# Patient Record
Sex: Male | Born: 1995 | Race: White | Hispanic: No | Marital: Single | Smoking: Former smoker
Health system: Southern US, Community
[De-identification: ages and names within clinical notes are randomized; demographics above are authoritative.]

## PROBLEM LIST (undated history)

## (undated) DIAGNOSIS — F419 Anxiety disorder, unspecified: Secondary | ICD-10-CM

## (undated) DIAGNOSIS — Z808 Family history of malignant neoplasm of other organs or systems: Secondary | ICD-10-CM

## (undated) DIAGNOSIS — R55 Syncope and collapse: Secondary | ICD-10-CM

## (undated) DIAGNOSIS — F329 Major depressive disorder, single episode, unspecified: Secondary | ICD-10-CM

## (undated) DIAGNOSIS — F32A Depression, unspecified: Secondary | ICD-10-CM

## (undated) DIAGNOSIS — Z803 Family history of malignant neoplasm of breast: Secondary | ICD-10-CM

## (undated) DIAGNOSIS — E785 Hyperlipidemia, unspecified: Secondary | ICD-10-CM

## (undated) DIAGNOSIS — R0600 Dyspnea, unspecified: Secondary | ICD-10-CM

## (undated) DIAGNOSIS — T7840XA Allergy, unspecified, initial encounter: Secondary | ICD-10-CM

## (undated) DIAGNOSIS — K5792 Diverticulitis of intestine, part unspecified, without perforation or abscess without bleeding: Secondary | ICD-10-CM

## (undated) HISTORY — DX: Family history of malignant neoplasm of breast: Z80.3

## (undated) HISTORY — DX: Anxiety disorder, unspecified: F41.9

## (undated) HISTORY — DX: Allergy, unspecified, initial encounter: T78.40XA

## (undated) HISTORY — DX: Hyperlipidemia, unspecified: E78.5

## (undated) HISTORY — DX: Family history of malignant neoplasm of other organs or systems: Z80.8

## (undated) HISTORY — DX: Syncope and collapse: R55

---
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
## 2014-07-27 ENCOUNTER — Emergency Department: Payer: Self-pay | Admitting: Emergency Medicine

## 2014-08-10 ENCOUNTER — Emergency Department: Payer: Self-pay | Admitting: Emergency Medicine

## 2014-08-12 ENCOUNTER — Ambulatory Visit: Payer: Self-pay | Admitting: Physician Assistant

## 2014-12-16 ENCOUNTER — Emergency Department: Admit: 2014-12-16 | Discharge status: Against Medical Advice | Payer: Self-pay | Admitting: Emergency Medicine

## 2014-12-16 LAB — COMPREHENSIVE METABOLIC PANEL
ALBUMIN: 4.1 g/dL
ALK PHOS: 85 U/L
AST: 24 U/L
Anion Gap: 6 — ABNORMAL LOW (ref 7–16)
BUN: 11 mg/dL
Bilirubin,Total: 0.2 mg/dL — ABNORMAL LOW
CALCIUM: 8.8 mg/dL — AB
Chloride: 106 mmol/L
Co2: 26 mmol/L
Creatinine: 0.81 mg/dL
EGFR (Non-African Amer.): >60
Glucose: 110 mg/dL — ABNORMAL HIGH
Potassium: 3.9 mmol/L
SGPT (ALT): 33 U/L
SODIUM: 138 mmol/L
Total Protein: 7.1 g/dL

## 2014-12-16 LAB — CBC WITH DIFFERENTIAL/PLATELET
BASOS PCT: 0.9 %
Basophil #: 0.1 10*3/uL (ref 0.0–0.1)
Eosinophil #: 0.3 10*3/uL (ref 0.0–0.7)
Eosinophil %: 2.9 %
HCT: 44 % (ref 40.0–52.0)
HGB: 14.6 g/dL (ref 13.0–18.0)
LYMPHS ABS: 2.9 10*3/uL (ref 1.0–3.6)
Lymphocyte %: 30 %
MCH: 29.1 pg (ref 26.0–34.0)
MCHC: 33.2 g/dL (ref 32.0–36.0)
MCV: 88 fL (ref 80–100)
MONO ABS: 1.2 x10 3/mm — AB (ref 0.2–1.0)
Monocyte %: 12.7 %
Neutrophil #: 5.1 10*3/uL (ref 1.4–6.5)
Neutrophil %: 53.5 %
Platelet: 233 10*3/uL (ref 150–440)
RBC: 5.01 10*6/uL (ref 4.40–5.90)
RDW: 13.8 % (ref 11.5–14.5)
WBC: 9.5 10*3/uL (ref 3.8–10.6)

## 2014-12-16 LAB — URINALYSIS, COMPLETE
Bacteria: NONE SEEN
Bilirubin,UR: NEGATIVE
Glucose,UR: NEGATIVE mg/dL (ref 0–75)
KETONE: NEGATIVE
Leukocyte Esterase: NEGATIVE
Nitrite: NEGATIVE
PH: 5 (ref 4.5–8.0)
SPECIFIC GRAVITY: 1.024 (ref 1.003–1.030)

## 2015-04-19 ENCOUNTER — Ambulatory Visit
Admission: EM | Admit: 2015-04-19 | Discharge: 2015-04-19 | Disposition: A | Discharge status: Home / Self Care | Payer: Medicaid Other | Attending: Family Medicine | Admitting: Family Medicine

## 2015-04-19 ENCOUNTER — Encounter: Payer: Self-pay | Admitting: Gynecology

## 2015-04-19 DIAGNOSIS — J40 Bronchitis, not specified as acute or chronic: Secondary | ICD-10-CM

## 2015-04-19 MED ORDER — HYDROCOD POLST-CPM POLST ER 10-8 MG/5ML PO SUER: 5.0000 mL | Freq: Every evening | ORAL | Status: DC | PRN

## 2015-04-19 MED ORDER — ALBUTEROL SULFATE HFA 108 (90 BASE) MCG/ACT IN AERS: 1.0000 | INHALATION_SPRAY | Freq: Four times a day (QID) | RESPIRATORY_TRACT | Status: DC | PRN

## 2015-04-19 MED ORDER — AZITHROMYCIN 200 MG/5ML PO SUSR: 500.0000 mg | Freq: Every day | ORAL | Status: AC

## 2015-04-19 NOTE — ED Notes (Addendum)
Patient c/o x 3 days with coughing / sinus drainage and greenish mucous.

## 2015-04-19 NOTE — ED Provider Notes (Signed)
SUBJECTIVE:  Timothy [name] is a 19 y.o. male who complains of sore throat, nasal congestion, productive cough for the last few days. Denies CP, SOB, N/V/D, abdominal pain, severe headache. Has tried OTC Medication without much relief. Denies asthma or smoking hx.   ROS: Negative except mentioned above.    OBJECTIVE: Vital signs are as noted in Epic   General: NAD HEENT: mild pharyngeal erythema, no exudate, no erythema of TMs, no cervical LAD Respiratory: CTA B, no accessory muscle use noted  Cardiology: RRR Neurological: CN II-XII grossly intact   ASSESSMENT:  Bronchitis  PLAN: Patient states that he cannot take pills. Patient given prescription for liquid Zithromax, Tussionex for bedtime use, albuterol when necessary, rest, hydration, seek medical attention if symptoms persist or worsen.        Paulina Fusi, MD 04/19/15 1128

## 2015-05-30 ENCOUNTER — Ambulatory Visit
Admission: EM | Admit: 2015-05-30 | Discharge: 2015-05-30 | Disposition: A | Discharge status: Home / Self Care | Payer: Medicaid Other | Attending: Internal Medicine | Admitting: Internal Medicine

## 2015-05-30 DIAGNOSIS — F17200 Nicotine dependence, unspecified, uncomplicated: Secondary | ICD-10-CM | POA: Diagnosis not present

## 2015-05-30 DIAGNOSIS — K2970 Gastritis, unspecified, without bleeding: Secondary | ICD-10-CM | POA: Diagnosis not present

## 2015-05-30 DIAGNOSIS — R197 Diarrhea, unspecified: Secondary | ICD-10-CM | POA: Diagnosis present

## 2015-05-30 DIAGNOSIS — R42 Dizziness and giddiness: Secondary | ICD-10-CM | POA: Diagnosis present

## 2015-05-30 LAB — URINALYSIS COMPLETE WITH MICROSCOPIC (ARMC ONLY)
BILIRUBIN URINE: NEGATIVE
Bacteria, UA: NONE SEEN — AB
Glucose, UA: NEGATIVE mg/dL
HGB URINE DIPSTICK: NEGATIVE
KETONES UR: NEGATIVE mg/dL
LEUKOCYTES UA: NEGATIVE
NITRITE: NEGATIVE
PH: 5.5 (ref 5.0–8.0)
Protein, ur: NEGATIVE mg/dL
RBC / HPF: NONE SEEN RBC/hpf (ref <3)
SPECIFIC GRAVITY, URINE: 1.01 (ref 1.005–1.030)

## 2015-05-30 MED ORDER — GI COCKTAIL ~~LOC~~: 30.0000 mL | Freq: Once | ORAL | Status: DC

## 2015-05-30 NOTE — Discharge Instructions (Signed)
° ° °  Zantac 75 mg Take 2 now, take 2 twice a day for 3 days then cut back to one a day Return to [facility] or the ER With dizziness or chest pain should they redevelop  Diarrhea and indigestion - mild foods, no high spice, no alcohol, Avoid sodas and acid foods- Drink water !   Gastritis, Adult Gastritis is soreness and puffiness (inflammation) of the lining of the stomach. If you do not get help, gastritis can cause bleeding and sores (ulcers) in the stomach. HOME CARE   Only take medicine as told by your doctor.  If you were given antibiotic medicines, take them as told. Finish the medicines even if you start to feel better.  Drink enough fluids to keep your pee (urine) clear or pale yellow.  Avoid foods and drinks that make your problems worse. Foods you may want to avoid include:  Caffeine or alcohol.  Chocolate.  Mint.  Garlic and onions.  Spicy foods.  Citrus fruits, including oranges, lemons, or limes.  Food containing tomatoes, including sauce, chili, salsa, and pizza.  Fried and fatty foods.  Eat small meals throughout the day instead of large meals. GET HELP RIGHT AWAY IF:   You have black or dark red poop (stools).  You throw up (vomit) blood. It may look like coffee grounds.  You cannot keep fluids down.  Your belly (abdominal) pain gets worse.  You have a fever.  You do not feel better after 1 week.  You have any other questions or concerns. MAKE SURE YOU:   Understand these instructions.  Will watch your condition.  Will get help right away if you are not doing well or get worse. Document Released: 02/01/2008 Document Revised: 11/07/2011 Document Reviewed: 09/28/2011 [facility] Patient Information 2015 [place], [place]. This information is not intended to replace advice given to you by your health care provider. Make sure you discuss any questions you have with your health care provider.

## 2015-05-30 NOTE — ED Notes (Signed)
Patient with diarrhea the past 24 hours. Started feeling dizzy and dehydrated this morning.

## 2015-05-30 NOTE — ED Provider Notes (Signed)
CSN: [id number]     Arrival date & time 05/30/15  1212 History   First MD Initiated Contact with Patient 05/30/15 1315     Chief Complaint  Patient presents with  . Diarrhea  . Dizziness   (Consider location/radiation/quality/duration/timing/severity/associated sxs/prior Treatment) HPI 19 yo M left work last night because of one episode of diarrhea reported. Needs to be seen to return to work. Had a Chicken biscuit and Dr. Malachi Bonds for breakfast and Cristy Friedlander for dinner yesterday. Blames the lasagna. Has not had fever, malaise or other symptoms Denies additional episodes in night or this morning. Has not eaten anything today. Thinks he feels a little dizzy but  Blames on no food. Can't describe the feeling/symptoms. Reports he had soft large formed stool this morning. Reports mid-epigastric discomfort at various times in the past. Tells disjointed story . Denies recent  drugs or alcohol  but references multiple past experiences of increased ingestion-reportedly stopping some months ago when Mid-epigastric discomfort took him to the ER. Was discharged without overnight stay.  Showing me that his sternum was responsible for the discomfort. Smokes a pack a week reported.  He is currently drinking Powerade and appears to be tolerating. He is offered crackers and peanut butter but defers Walking around the room and has been to bathroom and back without assistance-void. Denies any additional diarrhea Repeatedly returns to playing games and email on his cell phone despite visit  History reviewed. No pertinent past medical history. History reviewed. No pertinent past surgical history. History reviewed. No pertinent family history. Social History  Substance Use Topics  . Smoking status: Current Some Day Smoker  . Smokeless tobacco: None  . Alcohol Use: No    Review of Systems Review of 10 systems negative for acute change except as referenced in HPI  Allergies  Review of patient's allergies  indicates no known allergies.  Home Medications   Prior to Admission medications   Medication Sig Start Date End Date Taking? Authorizing Provider  albuterol (PROVENTIL HFA;VENTOLIN HFA) 108 (90 BASE) MCG/ACT inhaler Inhale 1-2 puffs into the lungs every 6 (six) hours as needed for wheezing or shortness of breath. 04/19/15   Paulina Fusi, MD  chlorpheniramine-HYDROcodone (TUSSIONEX PENNKINETIC ER) 10-8 MG/5ML SUER Take 5 mLs by mouth at bedtime as needed for cough. 04/19/15   Paulina Fusi, MD   Meds Ordered and Administered this Visit  Medications - No data to display  BP 116/68 mmHg  Pulse 78  Temp(Src) 98.1 F (36.7 C) (Oral)  Resp 20  Ht 6' (1.829 m)  Wt 270 lb (122.471 kg)  BMI 36.61 kg/m2  SpO2 98% No data found.  Results for orders placed or performed during the hospital encounter of 05/30/15  Urinalysis complete, with microscopic  Result Value Ref Range   Color, Urine YELLOW YELLOW   APPearance CLEAR CLEAR   Glucose, UA NEGATIVE NEGATIVE mg/dL   Bilirubin Urine NEGATIVE NEGATIVE   Ketones, ur NEGATIVE NEGATIVE mg/dL   Specific Gravity, Urine 1.010 1.005 - 1.030   Hgb urine dipstick NEGATIVE NEGATIVE   pH 5.5 5.0 - 8.0   Protein, ur NEGATIVE NEGATIVE mg/dL   Nitrite NEGATIVE NEGATIVE   Leukocytes, UA NEGATIVE NEGATIVE   RBC / HPF NONE SEEN <3 RBC/hpf   WBC, UA 0-5 <3 WBC/hpf   Bacteria, UA NONE SEEN (A) RARE   Squamous Epithelial / LPF 0-5 (A) RARE    Physical Exam  Constitutional: Alert and oriented, not ill appearing, VS are noted,  270 pounds General :  No acute distress; multiple somatic mild complaints Head:normocephalic, atraumatic,  Eyes: conjugate gaze,negative conjunctiva,  Ears:Grossly normal hearing, Bilateral canals and tympanic membranes WNL Nose:normal Mouth/throat :Mucous membranes moist, No pharyngeal erythema,no exudate,  Neck :  supple  Heart: Normal rate, regular rhythm Lung:    Normal respiratory effort and rate , no distress Back:    No  CVAT, no spinal tenderness noted Abd :    Obese ,soft,  Mid epigastric tenderness mild, bowel sounds present, no guarding,rebound or organomegaly appreciated MSK:   nontender, normal ROM all extremities; ambulatory in unit, on and off table without assistance Neuro:Face symmetric, EOMI, PERRLA,tongue midline. Grossly intact,normal gait, normal speech and language Skin:  Warm,dry,intact Psych: mood and affect WNL  ED Course  Procedures (including critical care time)  Labs Review Labs Reviewed  URINALYSIS COMPLETEWITH MICROSCOPIC ([facility] ONLY) - Abnormal; Notable for the following:    Bacteria, UA NONE SEEN (*)    Squamous Epithelial / LPF 0-5 (*)    All other components within normal limits  above  Imaging Review No results found.  U/A Ketones negative   ED ECG REPORT   Date10/08/2014 EKG Time:   Rate:60  Rhythm: Normal sinus rhythm with sinus arrythmia,   Axis:33 41 34  PRT  Intervals: PR 130  ST&T Change:none  Narrative Interpretation: Normal EKG Reviewed with Dr Valere Dross and considered normal    GI cocktail ordered for patient - discussed helpful in evaluation GI irritation.  He accepted with me and then refused when nurse brought to bedside. Impression Gastritis    MDM   1. Gastritis   Diagnosis and treatment discussed. Tried to encourage GI cocktail before discharge and he again refused. Encouraged him to try ZANTAC OTC 75 mg take 2, try for 2 weeks, no alcohol.  Questions fielded, expectations and recommendations reviewed.  Patient expresses understanding. Will return to [facility] with questions, concern or exacerbation.  Encourage PCP visit and GI intervention  Discouraged alcohol- encourage balanced diet- young/overweight  Work note given that he was seen and examined and released back to work for tomorrow. No Restrictions. Shift already underway today. He was disappointed and wanted a few days off- deferred.      Jan Fireman, PA-C 06/01/15 [phone]

## 2015-08-04 ENCOUNTER — Ambulatory Visit (INDEPENDENT_AMBULATORY_CARE_PROVIDER_SITE_OTHER): Payer: Medicaid Other | Admitting: Family Medicine

## 2015-08-04 ENCOUNTER — Encounter: Payer: Self-pay | Admitting: Family Medicine

## 2015-08-04 VITALS — BP 116/79 | HR 76 | Temp 97.9°F | Resp 16 | Ht 72.0 in | Wt 255.6 lb

## 2015-08-04 DIAGNOSIS — F17200 Nicotine dependence, unspecified, uncomplicated: Secondary | ICD-10-CM | POA: Insufficient documentation

## 2015-08-04 DIAGNOSIS — J301 Allergic rhinitis due to pollen: Secondary | ICD-10-CM

## 2015-08-04 DIAGNOSIS — Z720 Tobacco use: Secondary | ICD-10-CM | POA: Diagnosis not present

## 2015-08-04 DIAGNOSIS — E669 Obesity, unspecified: Secondary | ICD-10-CM | POA: Diagnosis not present

## 2015-08-04 DIAGNOSIS — Z7689 Persons encountering health services in other specified circumstances: Secondary | ICD-10-CM

## 2015-08-04 DIAGNOSIS — Z8249 Family history of ischemic heart disease and other diseases of the circulatory system: Secondary | ICD-10-CM | POA: Diagnosis not present

## 2015-08-04 DIAGNOSIS — Z7189 Other specified counseling: Secondary | ICD-10-CM

## 2015-08-04 MED ORDER — FLUTICASONE PROPIONATE 50 MCG/ACT NA SUSP: 2.0000 | Freq: Every day | NASAL | Status: DC

## 2015-08-04 NOTE — Assessment & Plan Note (Signed)
Flonase given to prevent allergy symptoms.

## 2015-08-04 NOTE — Progress Notes (Addendum)
Subjective:    Patient ID: Timothy [name], male    DOB: [birth year], 19 y.o.   MRN: [id number]  HPI: Timothy [name] is a 19 y.o. male presenting on 08/04/2015 for Establish Care   HPI  Pt presents to establish care today. Previous care provider was [facility].  It has been several years since his last PCP visit. Records from previous provider will be requested and reviewed. Current medical problems include:  Seasonal allergies: Takes PRN zyrtec. Pt has sinus drainage occasionally.  ADHD- on medications as a child. Feels as though he can focus easily as an adult.    Health maintenance:  Last tetanus- 2014  Current smoker- 1 pack per week. Trying to quit.  Works at a [employer]- Exposed to some chemicals at work No ETOH. Used to drink alcohol stopped 1.5 years ago after a fall when he was drunk.  Stays active with work. Recently lost 20lbs.     Past Medical History  Diagnosis Date  . Fainting spell     in middle school  . Allergy    Social History   Social History  . Marital Status: Single    Spouse Name: N/A  . Number of Children: N/A  . Years of Education: N/A   Occupational History  . Not on file.   Social History Main Topics  . Smoking status: Current Some Day Smoker -- 0.25 packs/day    Types: Cigarettes  . Smokeless tobacco: Not on file     Comment: pt smoke pack a week  . Alcohol Use: No  . Drug Use: No  . Sexual Activity: Not on file   Other Topics Concern  . Not on file   Social History Narrative   Family History  Problem Relation Age of Onset  . Heart disease Maternal Grandmother   . Diabetes Maternal Grandmother   . Cancer Maternal Grandmother     breast cancer  . BRCA 1/2 Maternal Grandmother   . Cancer Paternal Grandfather   . Healthy Sister   . BRCA 1/2 Maternal Aunt   . Depression Mother    No current outpatient prescriptions on file prior to visit.   No current facility-administered medications on file prior to  visit.    Review of Systems  Constitutional: Negative for fever and chills.  HENT: Negative.   Respiratory: Negative for chest tightness, shortness of breath and wheezing.   Cardiovascular: Negative for chest pain, palpitations and leg swelling.  Gastrointestinal: Negative for nausea, vomiting and abdominal pain.  Endocrine: Negative.   Genitourinary: Negative for dysuria, urgency, discharge, penile pain and testicular pain.  Musculoskeletal: Negative for back pain, joint swelling and arthralgias.  Skin: Negative.   Neurological: Negative for dizziness, weakness, numbness and headaches.  Psychiatric/Behavioral: Negative for sleep disturbance and dysphoric mood.   Per HPI unless specifically indicated above     Objective:    BP 116/79 mmHg  Pulse 76  Temp(Src) 97.9 F (36.6 C) (Oral)  Resp 16  Ht 6' (1.829 m)  Wt 255 lb 9.6 oz (115.939 kg)  BMI 34.66 kg/m2  Wt Readings from Last 3 Encounters:  01/26/16 249 lb (112.946 kg)  09/02/15 254 lb (115.214 kg)  08/04/15 255 lb 9.6 oz (115.939 kg)    Physical Exam  Constitutional: He is oriented to person, place, and time. He appears well-developed and well-nourished. No distress.  HENT:  Head: Normocephalic and atraumatic.  Neck: Neck supple. No thyromegaly present.  Cardiovascular: Normal rate, regular rhythm and  normal heart sounds.  Exam reveals no gallop and no friction rub.   No murmur heard. Pulmonary/Chest: Effort normal and breath sounds normal. He has no wheezes.  Abdominal: Soft. Bowel sounds are normal. He exhibits no distension. There is no tenderness. There is no rebound.  Musculoskeletal: Normal range of motion. He exhibits no edema or tenderness.  Neurological: He is alert and oriented to person, place, and time. He has normal reflexes.  Skin: Skin is warm and dry. No rash noted. No erythema.  Psychiatric: He has a normal mood and affect. His behavior is normal. Thought content normal.   Results for orders placed  or performed during the hospital encounter of 05/30/15  Urinalysis complete, with microscopic  Result Value Ref Range   Color, Urine YELLOW YELLOW   APPearance CLEAR CLEAR   Glucose, UA NEGATIVE NEGATIVE mg/dL   Bilirubin Urine NEGATIVE NEGATIVE   Ketones, ur NEGATIVE NEGATIVE mg/dL   Specific Gravity, Urine 1.010 1.005 - 1.030   Hgb urine dipstick NEGATIVE NEGATIVE   pH 5.5 5.0 - 8.0   Protein, ur NEGATIVE NEGATIVE mg/dL   Nitrite NEGATIVE NEGATIVE   Leukocytes, UA NEGATIVE NEGATIVE   RBC / HPF NONE SEEN <3 RBC/hpf   WBC, UA 0-5 <3 WBC/hpf   Bacteria, UA NONE SEEN (A) RARE   Squamous Epithelial / LPF 0-5 (A) RARE      Assessment & Plan:   Problem List Items Addressed This Visit      Respiratory   Allergic rhinitis due to pollen    Flonase given to prevent allergy symptoms.         Other   Smoker    Encouraged smoking cessation. Referred to [place] [facility]. 5 minutes of smoking cessation counseling completed.       Relevant Orders   Comprehensive Metabolic Panel (CMET)   Obesity    Encouraged healthy eating habits. Pt has lost 20lbs with increased exercise- commended efforts thus far.  Reviewed plate method of healthy eating. Encouraged 150 minutes moderate activity per week.       Relevant Orders   Lipid Profile    Other Visit Diagnoses    Encounter to establish care    -  Primary    Family history of heart disease        Check lipid panel to stratify risk factors.     Relevant Orders    Lipid Profile    Comprehensive Metabolic Panel (CMET)       Meds ordered this encounter  Medications  . DISCONTD: fluticasone (FLONASE) 50 MCG/ACT nasal spray    Sig: Place 2 sprays into both nostrils daily.    Dispense:  16 g    Refill:  11    Order Specific Question:  Supervising Provider    Answer:  Arlis Porta [phone]      Follow up plan: Return in about 1 year (around 08/03/2016), or if symptoms worsen or fail to improve.

## 2015-08-04 NOTE — Patient Instructions (Signed)

## 2015-08-04 NOTE — Assessment & Plan Note (Signed)
Encouraged smoking cessation. Referred to [place] [facility]. 5 minutes of smoking cessation counseling completed.

## 2015-08-04 NOTE — Assessment & Plan Note (Signed)
Encouraged healthy eating habits. Pt has lost 20lbs with increased exercise- commended efforts thus far.  Reviewed plate method of healthy eating. Encouraged 150 minutes moderate activity per week.

## 2015-09-02 ENCOUNTER — Ambulatory Visit (INDEPENDENT_AMBULATORY_CARE_PROVIDER_SITE_OTHER): Payer: Medicaid Other | Admitting: Family Medicine

## 2015-09-02 VITALS — BP 111/77 | HR 89 | Temp 98.1°F | Resp 16 | Ht 72.0 in | Wt 254.0 lb

## 2015-09-02 DIAGNOSIS — A084 Viral intestinal infection, unspecified: Secondary | ICD-10-CM | POA: Diagnosis not present

## 2015-09-02 MED ORDER — ONDANSETRON HCL 4 MG PO TABS: 4.0000 mg | ORAL_TABLET | Freq: Three times a day (TID) | ORAL | Status: DC | PRN

## 2015-09-02 NOTE — Patient Instructions (Signed)
I think you have a stomach virus.  We will treat your nausea today. You can take zofran every 8 hours as needed for nausea. This medication can make you sleepy.  We will treat your symptoms with supportive care: Ensure you are drinking plenty of fluids- I recommend diluting gatorade 2oz to 6 oz water or drinking chicken broth for electroyltes and to boost your blood sugar. Start with bland meals when you feel up to it. Drinking is the most important- you do not need to eat if you don't feel well. Try peptobismal to help with your symptoms.  Alarm signs: If you noticed blood in your diarrhea, diarrhea continues for > 1.5-2 weeks without improving, shortness of breath, chest pain, or any concerning symptoms, please seek medical attention.

## 2015-09-02 NOTE — Progress Notes (Signed)
Subjective:    Patient ID: [place] Tyler Iwata, male    DOB: Sep 20, 1995, 20 y.o.   MRN: [id number]  HPI: Martin[place] Tyler Storts is a 20 y.o. male presenting on 09/02/2015 for Emesis   HPI  Pt presents for nausea, vomiting, and diarrhea x 3 days. Symptoms began on New Years Day with vomiting. Diarrhea began later that day. 6-7 episodes of diarrhea total. 3 stools per day. Emesis 4-5 times per day.  Mostly emesis today. No blood in vomit.  No blood in stool. Stool is watery. Able to drink. Still voiding at least 3 times per day. Unable to keep food down.     Past Medical History  Diagnosis Date  . Fainting spell     in middle school  . Allergy     Current Outpatient Prescriptions on File Prior to Visit  Medication Sig  . albuterol (PROVENTIL HFA;VENTOLIN HFA) 108 (90 BASE) MCG/ACT inhaler Inhale 1-2 puffs into the lungs every 6 (six) hours as needed for wheezing or shortness of breath.  . fluticasone (FLONASE) 50 MCG/ACT nasal spray Place 2 sprays into both nostrils daily.   No current facility-administered medications on file prior to visit.    Review of Systems  Constitutional: Negative for fever and chills.  HENT: Negative.   Eyes: Negative.   Respiratory: Negative for chest tightness, shortness of breath and wheezing.   Cardiovascular: Negative for chest pain, palpitations and leg swelling.  Gastrointestinal: Positive for nausea, vomiting, abdominal pain and diarrhea. Negative for blood in stool.  Endocrine: Negative.   Genitourinary: Negative for urgency, decreased urine volume and difficulty urinating.  Musculoskeletal: Negative for neck pain and neck stiffness.  Skin: Negative for rash.  Neurological: Negative for dizziness, syncope and headaches.  Psychiatric/Behavioral: Negative.    Per HPI unless specifically indicated above     Objective:    BP 111/77 mmHg  Pulse 89  Temp(Src) 98.1 F (36.7 C) (Oral)  Resp 16  Ht 6' (1.829 m)  Wt 254 lb (115.214 kg)  BMI 34.44  kg/m2  Wt Readings from Last 3 Encounters:  09/02/15 254 lb (115.214 kg)  08/04/15 255 lb 9.6 oz (115.939 kg)  05/30/15 270 lb (122.471 kg)    Physical Exam  Constitutional: He is oriented to person, place, and time. He appears well-developed and well-nourished. No distress.  HENT:  Head: Normocephalic and atraumatic.  Mouth/Throat: Mucous membranes are normal.  Neck: Normal range of motion. Neck supple.  Pulmonary/Chest: Effort normal and breath sounds normal. No respiratory distress. He has no wheezes. He has no rales. He exhibits no tenderness.  Abdominal: Soft. He exhibits no distension, no abdominal bruit, no ascites and no pulsatile midline mass. Bowel sounds are increased. There is no hepatosplenomegaly. There is tenderness in the right lower quadrant and left lower quadrant. There is no rigidity, no rebound, no guarding, no tenderness at McBurney's point and negative Murphy's sign.  Musculoskeletal: Normal range of motion. He exhibits no edema or tenderness.  Lymphadenopathy:    He has no cervical adenopathy.  Neurological: He is alert and oriented to person, place, and time.  Skin: Skin is warm and dry. No rash noted. He is not diaphoretic. No erythema.   Results for orders placed or performed during the hospital encounter of 05/30/15  Urinalysis complete, with microscopic  Result Value Ref Range   Color, Urine YELLOW YELLOW   APPearance CLEAR CLEAR   Glucose, UA NEGATIVE NEGATIVE mg/dL   Bilirubin Urine NEGATIVE NEGATIVE   Ketones, ur  NEGATIVE NEGATIVE mg/dL   Specific Gravity, Urine 1.010 1.005 - 1.030   Hgb urine dipstick NEGATIVE NEGATIVE   pH 5.5 5.0 - 8.0   Protein, ur NEGATIVE NEGATIVE mg/dL   Nitrite NEGATIVE NEGATIVE   Leukocytes, UA NEGATIVE NEGATIVE   RBC / HPF NONE SEEN <3 RBC/hpf   WBC, UA 0-5 <3 WBC/hpf   Bacteria, UA NONE SEEN (A) RARE   Squamous Epithelial / LPF 0-5 (A) RARE      Assessment & Plan:   Problem List Items Addressed This Visit    None      Visit Diagnoses    Viral gastroenteritis    -  Primary    No s/s of dehydration, stable today. Supportive care at home. S/s of dehydration reviewed. Alarm symptoms reviewed. Zofran for nausea.     Relevant Medications    ondansetron (ZOFRAN) 4 MG tablet       Meds ordered this encounter  Medications  . ondansetron (ZOFRAN) 4 MG tablet    Sig: Take 1 tablet (4 mg total) by mouth every 8 (eight) hours as needed for nausea or vomiting.    Dispense:  20 tablet    Refill:  0    Order Specific Question:  Supervising Provider    Answer:  Arlis Porta [phone]      Follow up plan: Return if symptoms worsen or fail to improve.

## 2016-01-26 ENCOUNTER — Ambulatory Visit (INDEPENDENT_AMBULATORY_CARE_PROVIDER_SITE_OTHER): Payer: Medicaid Other | Admitting: Family Medicine

## 2016-01-26 ENCOUNTER — Encounter: Payer: Self-pay | Admitting: Family Medicine

## 2016-01-26 VITALS — BP 124/62 | HR 80 | Temp 98.0°F | Resp 16 | Ht 72.0 in | Wt 249.0 lb

## 2016-01-26 DIAGNOSIS — F17200 Nicotine dependence, unspecified, uncomplicated: Secondary | ICD-10-CM

## 2016-01-26 DIAGNOSIS — R7309 Other abnormal glucose: Secondary | ICD-10-CM | POA: Diagnosis not present

## 2016-01-26 DIAGNOSIS — Z6833 Body mass index (BMI) 33.0-33.9, adult: Secondary | ICD-10-CM

## 2016-01-26 DIAGNOSIS — Z0000 Encounter for general adult medical examination without abnormal findings: Secondary | ICD-10-CM | POA: Diagnosis not present

## 2016-01-26 DIAGNOSIS — Z720 Tobacco use: Secondary | ICD-10-CM

## 2016-01-26 NOTE — Assessment & Plan Note (Signed)
Commended patient on current weight loss. Discussed ideal body weight and strategies for weight loss. Recommend eliminating soda from diet. Healthy options for fast food. Increase exercise.

## 2016-01-26 NOTE — Assessment & Plan Note (Signed)
Encouraged patient to quit smoking. Reviewed strategies to quit smoking. Pt would like to quit without medication aid.

## 2016-01-26 NOTE — Progress Notes (Signed)
Subjective:    Patient ID: Timothy [name], male    DOB: [birth year], 20 y.o.   MRN: [id number]  HPI: Timothy [name] is a 20 y.o. male presenting on 01/26/2016 for Annual Exam   HPI  Pt presents for annual physical. No health concerns. Works at [employer]. Mild exercise- stays active in his job. 5lb weight loss since December.  Was previously 280lbs. Typical diet- only eats twice per day-Eats mainly fast food- chic-fil-a, cook-out, and Wednesday- chicken sandwichs. Smoker- 1 pack per week. Is trying to quit.  Alcohol- drinks 2-3 drinks per week.    Past Medical History  Diagnosis Date  . Fainting spell     in middle school  . Allergy    Social History   Social History  . Marital Status: Single    Spouse Name: N/A  . Number of Children: N/A  . Years of Education: N/A   Occupational History  . Not on file.   Social History Main Topics  . Smoking status: Current Some Day Smoker -- 0.25 packs/day    Types: Cigarettes  . Smokeless tobacco: Not on file     Comment: pt smoke pack a week  . Alcohol Use: No  . Drug Use: No  . Sexual Activity: Not on file   Other Topics Concern  . Not on file   Social History Narrative   Family History  Problem Relation Age of Onset  . Heart disease Maternal Grandmother   . Diabetes Maternal Grandmother   . Cancer Maternal Grandmother     breast cancer  . BRCA 1/2 Maternal Grandmother   . Cancer Paternal Grandfather   . Healthy Sister   . BRCA 1/2 Maternal Aunt   . Depression Mother    No current outpatient prescriptions on file prior to visit.   No current facility-administered medications on file prior to visit.    Review of Systems  Constitutional: Negative for fever and chills.  HENT: Negative.   Respiratory: Negative for chest tightness, shortness of breath and wheezing.   Cardiovascular: Negative for chest pain, palpitations and leg swelling.  Gastrointestinal: Negative for nausea, vomiting and abdominal pain.    Endocrine: Negative.   Genitourinary: Negative for dysuria, urgency, discharge, penile pain and testicular pain.  Musculoskeletal: Negative for back pain, joint swelling and arthralgias.  Skin: Negative.   Neurological: Negative for dizziness, weakness, numbness and headaches.  Psychiatric/Behavioral: Negative for sleep disturbance and dysphoric mood.   Per HPI unless specifically indicated above     Objective:    BP 124/62 mmHg  Pulse 80  Temp(Src) 98 F (36.7 C) (Oral)  Resp 16  Ht 6' (1.829 m)  Wt 249 lb (112.946 kg)  BMI 33.76 kg/m2  Wt Readings from Last 3 Encounters:  01/26/16 249 lb (112.946 kg)  09/02/15 254 lb (115.214 kg)  08/04/15 255 lb 9.6 oz (115.939 kg)    Physical Exam  Constitutional: He is oriented to person, place, and time. He appears well-developed and well-nourished. No distress.  HENT:  Head: Normocephalic and atraumatic.  Neck: Neck supple. No thyromegaly present.  Cardiovascular: Normal rate, regular rhythm and normal heart sounds.  Exam reveals no gallop and no friction rub.   No murmur heard. Pulmonary/Chest: Effort normal and breath sounds normal. He has no wheezes.  Abdominal: Soft. Bowel sounds are normal. He exhibits no distension. There is no tenderness. There is no rebound.  Genitourinary:  Deferred due to patient preference.   Musculoskeletal: Normal range of motion.  He exhibits no edema or tenderness.  Neurological: He is alert and oriented to person, place, and time. He has normal reflexes.  Skin: Skin is warm and dry. No rash noted. No erythema.  Psychiatric: He has a normal mood and affect. His behavior is normal. Thought content normal.   Results for orders placed or performed during the hospital encounter of 05/30/15  Urinalysis complete, with microscopic  Result Value Ref Range   Color, Urine YELLOW YELLOW   APPearance CLEAR CLEAR   Glucose, UA NEGATIVE NEGATIVE mg/dL   Bilirubin Urine NEGATIVE NEGATIVE   Ketones, ur NEGATIVE  NEGATIVE mg/dL   Specific Gravity, Urine 1.010 1.005 - 1.030   Hgb urine dipstick NEGATIVE NEGATIVE   pH 5.5 5.0 - 8.0   Protein, ur NEGATIVE NEGATIVE mg/dL   Nitrite NEGATIVE NEGATIVE   Leukocytes, UA NEGATIVE NEGATIVE   RBC / HPF NONE SEEN <3 RBC/hpf   WBC, UA 0-5 <3 WBC/hpf   Bacteria, UA NONE SEEN (A) RARE   Squamous Epithelial / LPF 0-5 (A) RARE      Assessment & Plan:   Problem List Items Addressed This Visit      Other   Smoker    Encouraged patient to quit smoking. Reviewed strategies to quit smoking. Pt would like to quit without medication aid.       Relevant Orders   Comprehensive metabolic panel   Lipid Profile   BMI 33.0-33.9,adult    Commended patient on current weight loss. Discussed ideal body weight and strategies for weight loss. Recommend eliminating soda from diet. Healthy options for fast food. Increase exercise.       Relevant Orders   Lipid Profile    Other Visit Diagnoses    Preventative health care    -  Primary    Reviewed health maintenance.     Relevant Orders    Comprehensive metabolic panel    Lipid Profile    Elevated glucose        Glucose of 119 on last labs- will screen hgA1c with health maintenance labwork.     Relevant Orders    Hemoglobin A1c       No orders of the defined types were placed in this encounter.      Follow up plan: Return in about 1 year (around 01/25/2017), or if symptoms worsen or fail to improve.

## 2016-01-26 NOTE — Patient Instructions (Addendum)
Your BMI is considered obese.  Losing weight will help you maintain health throughout your lifespan and prevent the development of chronic health conditions.  Please try to meet the goal of 150 minutes of exercise per week.  This is generally 30-40 minutes of moderate activity 3-4 times per week. Consider a calorie goal of 1800 per day. Eat mainly fruits, vegetables, and lean proteins (chicken or fish).  A great resource for healthy living and weight loss is VRemover.com.ee Eliminate any sugar sweetened beverages such as juice or soda.    Please make an appt to have fasting labs drawn anytime after Thursday, June, 1 in this office.

## 2016-10-21 ENCOUNTER — Encounter: Payer: Self-pay | Admitting: Physician Assistant

## 2016-10-21 ENCOUNTER — Ambulatory Visit (INDEPENDENT_AMBULATORY_CARE_PROVIDER_SITE_OTHER): Payer: Medicaid Other | Admitting: Physician Assistant

## 2016-10-21 VITALS — BP 118/75 | HR 68 | Temp 98.4°F | Resp 16 | Ht 72.0 in | Wt 257.0 lb

## 2016-10-21 DIAGNOSIS — K529 Noninfective gastroenteritis and colitis, unspecified: Secondary | ICD-10-CM | POA: Diagnosis not present

## 2016-10-21 DIAGNOSIS — R059 Cough, unspecified: Secondary | ICD-10-CM

## 2016-10-21 DIAGNOSIS — R05 Cough: Secondary | ICD-10-CM

## 2016-10-21 MED ORDER — PROMETHAZINE HCL 12.5 MG PO TABS: 12.5000 mg | ORAL_TABLET | Freq: Three times a day (TID) | ORAL | 0 refills | Status: DC | PRN

## 2016-10-21 NOTE — Progress Notes (Signed)
Subjective:    Patient ID: Timothy [name], male    DOB: [birth year], 21 y.o.   MRN: [id number]  Timothy [name] is a 21 y.o. male presenting on 10/21/2016 for Emesis; Headache; and Cough   HPI   Patient is a 21 y/o man w/ a 1/4 pack daily smoking history presenting today with nausea, vomiting, and cough ongoing since Monday. He says he felt sick after lunch on Monday and vomited the next morning at work. He has been nauseas and vomiting since, with some loose non-bloody stools. He is feeling better now in comparison, and is able to hold water and some food down, but did vomit last night. Denies racing heart or dizziness. No new medications. Some generalized abdominal pain. Urinating okay.  Social History  Substance Use Topics  . Smoking status: Current Some Day Smoker    Packs/day: 0.25    Types: Cigarettes  . Smokeless tobacco: Never Used     Comment: pt smoke pack a week  . Alcohol use No    Review of Systems Per HPI unless specifically indicated above     Objective:    BP 118/75 (BP Location: Left Arm, Patient Position: Sitting, Cuff Size: Large)   Pulse 68   Temp 98.4 F (36.9 C) (Oral)   Resp 16   Ht 6' (1.829 m)   Wt 257 lb (116.6 kg)   BMI 34.86 kg/m   Wt Readings from Last 3 Encounters:  10/21/16 257 lb (116.6 kg)  01/26/16 249 lb (112.9 kg)  09/02/15 254 lb (115.2 kg)    Physical Exam  Constitutional: He appears well-developed and well-nourished. No distress.  HENT:  Right Ear: Tympanic membrane and external ear normal.  Left Ear: Tympanic membrane and external ear normal.  Mouth/Throat: Mucous membranes are normal. Mucous membranes are not pale and not dry. Posterior oropharyngeal erythema present. No oropharyngeal exudate.  Eyes: Conjunctivae are normal.  Neck: Neck supple.  Cardiovascular: Normal rate and regular rhythm.   Pulmonary/Chest: Effort normal and breath sounds normal.  Abdominal: Soft. Bowel sounds are normal. He exhibits no distension.  There is tenderness in the epigastric area. There is no rebound and no guarding.  Lymphadenopathy:    He has no cervical adenopathy.  Neurological: He is alert.  Skin: Skin is warm and dry.  Good turgor   Psychiatric: He has a normal mood and affect.   Results for orders placed or performed during the hospital encounter of 05/30/15  Urinalysis complete, with microscopic  Result Value Ref Range   Color, Urine YELLOW YELLOW   APPearance CLEAR CLEAR   Glucose, UA NEGATIVE NEGATIVE mg/dL   Bilirubin Urine NEGATIVE NEGATIVE   Ketones, ur NEGATIVE NEGATIVE mg/dL   Specific Gravity, Urine 1.010 1.005 - 1.030   Hgb urine dipstick NEGATIVE NEGATIVE   pH 5.5 5.0 - 8.0   Protein, ur NEGATIVE NEGATIVE mg/dL   Nitrite NEGATIVE NEGATIVE   Leukocytes, UA NEGATIVE NEGATIVE   RBC / HPF NONE SEEN <3 RBC/hpf   WBC, UA 0-5 <3 WBC/hpf   Bacteria, UA NONE SEEN (A) RARE   Squamous Epithelial / LPF 0-5 (A) RARE      Assessment & Plan:   1. Gastroenteritis  Symptoms consistent with viral gastroenteritis. Treat as below, counseled on adequate hydration and signs of dehydration, when to seek emergency care. Provided with worknote.  - promethazine (PHENERGAN) 12.5 MG tablet; Take 1 tablet (12.5 mg total) by mouth every 8 (eight) hours as needed for nausea or  vomiting.  Dispense: 20 tablet; Refill: 0  2. Cough  Likely 2/2 pharyngeal irritation from vomiting. Will resolve with resolution of other symptoms.   Return if symptoms worsen or fail to improve.  Carles Collet, PA-C [facility] 10/21/2016, 9:07 AM

## 2016-10-21 NOTE — Patient Instructions (Signed)
Viral Gastroenteritis, Adult Introduction Viral gastroenteritis is also known as the stomach flu. This condition is caused by certain germs (viruses). These germs can be passed from person to person very easily (are very contagious). This condition can cause sudden watery poop (diarrhea), fever, and throwing up (vomiting). Having watery poop and throwing up can make you feel weak and cause you to get dehydrated. Dehydration can make you tired and thirsty, make you have a dry mouth, and make it so you pee (urinate) less often. Older adults and people with other diseases or a weak defense system (immune system) are at higher risk for dehydration. It is important to replace the fluids that you lose from having watery poop and throwing up. Follow these instructions at home: Follow instructions from your doctor about how to care for yourself at home. Eating and drinking Follow these instructions as told by your doctor:  Take an oral rehydration solution (ORS). This is a drink that is sold at pharmacies and stores.  Drink clear fluids in small amounts as you are able, such as:  Water.  Ice chips.  Diluted fruit juice.  Low-calorie sports drinks.  Eat bland, easy-to-digest foods in small amounts as you are able, such as:  Bananas.  Applesauce.  Rice.  Low-fat (lean) meats.  Toast.  Crackers.  Avoid fluids that have a lot of sugar or caffeine in them.  Avoid alcohol.  Avoid spicy or fatty foods. General instructions  Drink enough fluid to keep your pee (urine) clear or pale yellow.  Wash your hands often. If you cannot use soap and water, use hand sanitizer.  Make sure that all people in your home wash their hands well and often.  Rest at home while you get better.  Take over-the-counter and prescription medicines only as told by your doctor.  Watch your condition for any changes.  Take a warm bath to help with any burning or pain from having watery poop.  Keep all  follow-up visits as told by your doctor. This is important. Contact a doctor if:  You cannot keep fluids down.  Your symptoms get worse.  You have new symptoms.  You feel light-headed or dizzy.  You have muscle cramps. Get help right away if:  You have chest pain.  You feel very weak or you pass out (faint).  You see blood in your throw-up.  Your throw-up looks like coffee grounds.  You have bloody or black poop (stools) or poop that look like tar.  You have a very bad headache, a stiff neck, or both.  You have a rash.  You have very bad pain, cramping, or bloating in your belly (abdomen).  You have trouble breathing.  You are breathing very quickly.  Your heart is beating very quickly.  Your skin feels cold and clammy.  You feel confused.  You have pain when you pee.  You have signs of dehydration, such as:  Dark pee, hardly any pee, or no pee.  Cracked lips.  Dry mouth.  Sunken eyes.  Sleepiness.  Weakness. This information is not intended to replace advice given to you by your health care provider. Make sure you discuss any questions you have with your health care provider. Document Released: 02/01/2008 Document Revised: 03/04/2016 Document Reviewed: 04/21/2015  2017 Elsevier  

## 2017-05-20 ENCOUNTER — Emergency Department: Admission: EM | Admit: 2017-05-20 | Discharge: 2017-05-21 | Discharge status: Against Medical Advice | Payer: Self-pay

## 2017-05-21 ENCOUNTER — Encounter: Payer: Self-pay | Admitting: Emergency Medicine

## 2017-05-21 ENCOUNTER — Emergency Department
Admission: EM | Admit: 2017-05-21 | Discharge: 2017-05-21 | Disposition: A | Discharge status: Home / Self Care | Payer: Self-pay | Attending: Emergency Medicine | Admitting: Emergency Medicine

## 2017-05-21 DIAGNOSIS — F17210 Nicotine dependence, cigarettes, uncomplicated: Secondary | ICD-10-CM | POA: Insufficient documentation

## 2017-05-21 DIAGNOSIS — J0110 Acute frontal sinusitis, unspecified: Secondary | ICD-10-CM | POA: Insufficient documentation

## 2017-05-21 MED ORDER — DEXAMETHASONE SODIUM PHOSPHATE 10 MG/ML IJ SOLN
10.0000 mg | Freq: Once | INTRAMUSCULAR | Status: AC
  Administered 2017-05-21: 10 mg via INTRAMUSCULAR
  Filled 2017-05-21: qty 1

## 2017-05-21 MED ORDER — AMOXICILLIN-POT CLAVULANATE 250-62.5 MG/5ML PO SUSR: 875.0000 mg | Freq: Two times a day (BID) | ORAL | 0 refills | Status: AC

## 2017-05-21 MED ORDER — FLUTICASONE PROPIONATE 50 MCG/ACT NA SUSP: 2.0000 | Freq: Every day | NASAL | 0 refills | Status: DC

## 2017-05-21 NOTE — ED Notes (Signed)
Pt c/o cough and congestion for several days, pt states he has been taking benadryl daily before going to sleep. 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 Pt states he vomited yesterday at work and today, however, states it was due to productive cough making him gag.  Pt states he feels drainage down the back of his throat. Pt c/o subjective fevers.  Pt states he is supposed to be taking Zyrtec but does not.  PT STATES HE CANNOT SWALLOW PILLS

## 2017-05-21 NOTE — ED Provider Notes (Signed)
[facility] Emergency Department Provider Note  ____________________________________________  Time seen: Approximately 2:10 PM  I have reviewed the triage vital signs and the nursing notes.   HISTORY  Chief Complaint Nasal Congestion    HPI Timothy [name] is a 21 y.o. male that presents to emergency department for evaluation of nasal congestion for 5 days. It is painful over his right cheek. Patient states that he had to leave work last night because he was coughing from the nasal drainage so much that he vomited. Last sinus infection was in the spring. He feels like he has had a low-grade fever but has not checked his temperature. No chills, sore throat, shortness of breath, chest pain, nausea, abdominal pain.   Past Medical History:  Diagnosis Date  . Allergy   . Fainting spell    in middle school    Patient Active Problem List   Diagnosis Date Noted  . Smoker 08/04/2015  . Obesity 08/04/2015  . Allergic rhinitis due to pollen 08/04/2015    History reviewed. No pertinent surgical history.  Prior to Admission medications   Medication Sig Start Date End Date Taking? Authorizing Provider  amoxicillin-clavulanate (AUGMENTIN) 250-62.5 MG/5ML suspension Take 17.5 mLs (875 mg total) by mouth 2 (two) times daily. 05/21/17 05/31/17  Laban Emperor, PA-C  fluticasone (FLONASE) 50 MCG/ACT nasal spray Place 2 sprays into both nostrils daily. 05/21/17 05/21/18  Laban Emperor, PA-C  promethazine (PHENERGAN) 12.5 MG tablet Take 1 tablet (12.5 mg total) by mouth every 8 (eight) hours as needed for nausea or vomiting. 10/21/16   Trinna Post, PA-C    Allergies Patient has no known allergies.  Family History  Problem Relation Age of Onset  . Heart disease Maternal Grandmother   . Diabetes Maternal Grandmother   . Cancer Maternal Grandmother        breast cancer  . BRCA 1/2 Maternal Grandmother   . Cancer Paternal Grandfather   . Healthy Sister   .  BRCA 1/2 Maternal Aunt   . Depression Mother     Social History Social History  Substance Use Topics  . Smoking status: Current Some Day Smoker    Packs/day: 0.25    Types: Cigarettes  . Smokeless tobacco: Never Used     Comment: pt smoke pack a week  . Alcohol use No     Review of Systems  Constitutional: No fever/chills Eyes: No visual changes. No discharge. ENT: Positive for congestion and rhinorrhea. Cardiovascular: No chest pain. Respiratory: No SOB. Gastrointestinal: No abdominal pain.  No nausea.  No diarrhea.  No constipation. Musculoskeletal: Negative for musculoskeletal pain. Skin: Negative for rash, abrasions, lacerations, ecchymosis. Neurological: Negative for headaches.   ____________________________________________   PHYSICAL EXAM:  VITAL SIGNS: ED Triage Vitals  Enc Vitals Group     BP 05/21/17 1331 (!) 151/68     Pulse Rate 05/21/17 1331 83     Resp 05/21/17 1331 16     Temp 05/21/17 1331 98.3 F (36.8 C)     Temp Source 05/21/17 1331 Oral     SpO2 05/21/17 1331 96 %     Weight 05/21/17 1326 257 lb (116.6 kg)     Height 05/21/17 1326 6' (1.829 m)     Head Circumference --      Peak Flow --      Pain Score 05/21/17 1326 7     Pain Loc --      Pain Edu? --      Excl. in  GC? --      Constitutional: Alert and oriented. Well appearing and in no acute distress. Eyes: Conjunctivae are normal. PERRL. EOMI. No discharge. Head: Atraumatic. ENT: Positive for right-sided frontal sinus tenderness.      Ears: Tympanic membranes pearly gray with good landmarks. No discharge.      Nose: Mild congestion/rhinnorhea.      Mouth/Throat: Mucous membranes are moist. Oropharynx non-erythematous. Tonsils not enlarged. No exudates. Uvula midline. Neck: No stridor.   Hematological/Lymphatic/Immunilogical: No cervical lymphadenopathy. Cardiovascular: Normal rate, regular rhythm.  Good peripheral circulation. Respiratory: Normal respiratory effort without tachypnea  or retractions. Lungs CTAB. Good air entry to the bases with no decreased or absent breath sounds. Gastrointestinal: Bowel sounds 4 quadrants. Soft and nontender to palpation. No guarding or rigidity. No palpable masses. No distention. Musculoskeletal: Full range of motion to all extremities. No gross deformities appreciated. Neurologic:  Normal speech and language. No gross focal neurologic deficits are appreciated.  Skin:  Skin is warm, dry and intact. No rash noted.   ____________________________________________   LABS (all labs ordered are listed, but only abnormal results are displayed)  Labs Reviewed - No data to display ____________________________________________  EKG   ____________________________________________  RADIOLOGY  No results found.  ____________________________________________    PROCEDURES  Procedure(s) performed:    Procedures    Medications  dexamethasone (DECADRON) injection 10 mg (10 mg Intramuscular Given 05/21/17 1430)     ____________________________________________   INITIAL IMPRESSION / ASSESSMENT AND PLAN / ED COURSE  Pertinent labs & imaging results that were available during my care of the patient were reviewed by me and considered in my medical decision making (see chart for details).  Review of the [place] CSRS was performed in accordance of the [place] prior to dispensing any controlled drugs.     Patient's diagnosis is consistent with sinusitis. Vital signs and exam are reassuring. Patient appears well and is staying well hydrated. Patient feels comfortable going home. Patient will be discharged home with prescriptions for Augmentin and Flonase. Patient is to follow up with PCP as needed or otherwise directed. Patient is given ED precautions to return to the ED for any worsening or new symptoms.     ____________________________________________  FINAL CLINICAL IMPRESSION(S) / ED DIAGNOSES  Final diagnoses:  Acute  non-recurrent frontal sinusitis      NEW MEDICATIONS STARTED DURING THIS VISIT:  Discharge Medication List as of 05/21/2017  3:17 PM    START taking these medications   Details  amoxicillin-clavulanate (AUGMENTIN) 250-62.5 MG/5ML suspension Take 17.5 mLs (875 mg total) by mouth 2 (two) times daily., Starting Sun 05/21/2017, Until Wed 05/31/2017, Print    fluticasone (FLONASE) 50 MCG/ACT nasal spray Place 2 sprays into both nostrils daily., Starting Sun 05/21/2017, Until Mon 05/21/2018, Print            This chart was dictated using voice recognition software/Dragon. Despite best efforts to proofread, errors can occur which can change the meaning. Any change was purely unintentional.    Laban Emperor, PA-C 05/21/17 1552    Schuyler Amor, MD 05/22/17 2154

## 2017-05-21 NOTE — ED Notes (Signed)
Patient called for triage, no answer

## 2017-05-21 NOTE — ED Triage Notes (Signed)
Pt c/o nasal congestion for 5 days.  Pain to maxillary and frontal sinuses.  Has been draining posterior yellow drainage.  No fevers.  No dyspnea. Pain with coughing but does not want any xrays or other testing.

## 2017-07-19 ENCOUNTER — Encounter: Payer: Self-pay | Admitting: Nurse Practitioner

## 2017-07-19 ENCOUNTER — Other Ambulatory Visit: Payer: Self-pay

## 2017-07-19 ENCOUNTER — Ambulatory Visit (INDEPENDENT_AMBULATORY_CARE_PROVIDER_SITE_OTHER): Payer: Self-pay | Admitting: Nurse Practitioner

## 2017-07-19 VITALS — BP 115/61 | HR 85 | Temp 98.7°F | Ht 72.0 in | Wt 233.0 lb

## 2017-07-19 DIAGNOSIS — F419 Anxiety disorder, unspecified: Secondary | ICD-10-CM

## 2017-07-19 DIAGNOSIS — G4709 Other insomnia: Secondary | ICD-10-CM

## 2017-07-19 DIAGNOSIS — F329 Major depressive disorder, single episode, unspecified: Secondary | ICD-10-CM

## 2017-07-19 DIAGNOSIS — R1084 Generalized abdominal pain: Secondary | ICD-10-CM

## 2017-07-19 DIAGNOSIS — F32A Depression, unspecified: Secondary | ICD-10-CM

## 2017-07-19 MED ORDER — ESCITALOPRAM OXALATE 10 MG PO TABS: ORAL_TABLET | ORAL | 2 refills | Status: DC

## 2017-07-19 NOTE — Patient Instructions (Addendum)
Timothy [name], Thank you for coming in to clinic today.  1. For sleep:  - START taking melatonin 5 mg each day about 30 minutes before you want to go to sleep.  Keep a regular bedtime within about 2 hours from one day to the next. - Make sure your room is pitch black to promote good sleep. Sleep Hygiene Tips  Take medicines only as directed by your health care provider.  Keep regular sleeping and waking hours. Avoid naps.  Keep a sleep diary to help you and your health care provider figure out what could be causing your insomnia. Include:  When you sleep.  When you wake up during the night.  How well you sleep.  How rested you feel the next day.  Any side effects of medicines you are taking.  What you eat and drink.  Make your bedroom a comfortable place where it is easy to fall asleep:  Put up shades or special blackout curtains to block light from outside.  Use a white noise machine to block noise.  Keep the temperature cool.  Exercise regularly as directed by your health care provider. Avoid exercising right before bedtime.  Use relaxation techniques to manage stress. Ask your health care provider to suggest some techniques that may work well for you. These may include:  Breathing exercises.  Routines to release muscle tension.  Visualizing peaceful scenes.  Cut back on alcohol, caffeinated beverages, and cigarettes, especially close to bedtime. These can disrupt your sleep.  Do not overeat or eat spicy foods right before bedtime. This can lead to digestive discomfort that can make it hard for you to sleep.  Limit screen use before bedtime. This includes:  Watching TV.  Using your smartphone, tablet, and computer.  Stick to a routine. This can help you fall asleep faster. Try to do a quiet activity, brush your teeth, and go to bed at the same time each night.  Get out of bed if you are still awake after 15 minutes of trying to sleep. Keep the lights down, but try  reading or doing a quiet activity. When you feel sleepy, go back to bed.  Make sure that you drive carefully. Avoid driving if you feel very sleepy.  Keep all follow-up appointments as directed by your health care provider. This is important.   2. For your abdominal pain and loose stool: - This is possibly related to increased stress and anxiety. - START taking escitalopram 10 mg tablet.  Take 1/2 tablet once daily for 7 days. Then, increase to 1 tablet once daily and continue.   Please schedule a follow-up appointment with Cassell Smiles, AGNP. Return in about 4 weeks (around 08/16/2017) for anxiety and depression.  If you have any other questions or concerns, please feel free to call the clinic or send a message through [facility]. You may also schedule an earlier appointment if necessary.  You will receive a survey after today's visit either digitally by e-mail or paper by [employer]. Your experiences and feedback matter to [place].  Please respond so we know how we are doing as we provide care for you.   Cassell Smiles, DNP, AGNP-BC Adult Gerontology Nurse Practitioner [facility], [facility]    Stress and Stress Management Stress is a normal reaction to life events. It is what you feel when life demands more than you are used to or more than you can handle. Some stress can be useful. For example, the stress reaction can help you  catch the last bus of the day, study for a test, or meet a deadline at work. But stress that occurs too often or for too long can cause problems. It can affect your emotional health and interfere with relationships and normal daily activities. Too much stress can weaken your immune system and increase your risk for physical illness. If you already have a medical problem, stress can make it worse. What are the causes? All sorts of life events may cause stress. An event that causes stress for one person may not be stressful for another person. Major life  events commonly cause stress. These may be positive or negative. Examples include losing your job, moving into a new home, getting married, having a baby, or losing a loved one. Less obvious life events may also cause stress, especially if they occur day after day or in combination. Examples include working long hours, driving in traffic, caring for children, being in debt, or being in a difficult relationship. What are the signs or symptoms? Stress may cause emotional symptoms including, the following:  Anxiety. This is feeling worried, afraid, on edge, overwhelmed, or out of control.  Anger. This is feeling irritated or impatient.  Depression. This is feeling sad, down, helpless, or guilty.  Difficulty focusing, remembering, or making decisions.  Stress may cause physical symptoms, including the following:  Aches and pains. These may affect your head, neck, back, stomach, or other areas of your body.  Tight muscles or clenched jaw.  Low energy or trouble sleeping.  Stress may cause unhealthy behaviors, including the following:  Eating to feel better (overeating) or skipping meals.  Sleeping too little, too much, or both.  Working too much or putting off tasks (procrastination).  Smoking, drinking alcohol, or using drugs to feel better.  How is this diagnosed? Stress is diagnosed through an assessment by your health care provider. Your health care provider will ask questions about your symptoms and any stressful life events.Your health care provider will also ask about your medical history and may order blood tests or other tests. Certain medical conditions and medicine can cause physical symptoms similar to stress. Mental illness can cause emotional symptoms and unhealthy behaviors similar to stress. Your health care provider may refer you to a mental health professional for further evaluation. How is this treated? Stress management is the recommended treatment for stress.The  goals of stress management are reducing stressful life events and coping with stress in healthy ways. Techniques for reducing stressful life events include the following:  Stress identification. Self-monitor for stress and identify what causes stress for you. These skills may help you to avoid some stressful events.  Time management. Set your priorities, keep a calendar of events, and learn to say "no." These tools can help you avoid making too many commitments.  Techniques for coping with stress include the following:  Rethinking the problem. Try to think realistically about stressful events rather than ignoring them or overreacting. Try to find the positives in a stressful situation rather than focusing on the negatives.  Exercise. Physical exercise can release both physical and emotional tension. The key is to find a form of exercise you enjoy and do it regularly.  Relaxation techniques. These relax the body and mind. Examples include yoga, meditation, tai chi, biofeedback, deep breathing, progressive muscle relaxation, listening to music, being out in nature, journaling, and other hobbies. Again, the key is to find one or more that you enjoy and can do regularly.  Healthy lifestyle. Eat  a balanced diet, get plenty of sleep, and do not smoke. Avoid using alcohol or drugs to relax.  Strong support network. Spend time with family, friends, or other people you enjoy being around.Express your feelings and talk things over with someone you trust.  Counseling or talktherapy with a mental health professional may be helpful if you are having difficulty managing stress on your own. Medicine is typically not recommended for the treatment of stress.Talk to your health care provider if you think you need medicine for symptoms of stress. Follow these instructions at home:  Keep all follow-up visits as directed by your health care provider.  Take all medicines as directed by your health care  provider. Contact a health care provider if:  Your symptoms get worse or you start having new symptoms.  You feel overwhelmed by your problems and can no longer manage them on your own. Get help right away if:  You feel like hurting yourself or someone else. This information is not intended to replace advice given to you by your health care provider. Make sure you discuss any questions you have with your health care provider. Document Released: 02/08/2001 Document Revised: 01/21/2016 Document Reviewed: 04/09/2013 Elsevier Interactive Patient Education  2017 [employer].

## 2017-07-19 NOTE — Progress Notes (Signed)
Subjective:    Patient ID: Timothy [name], male    DOB: [birth year], 21 y.o.   MRN: [id number]  Timothy [name] is a 21 y.o. male presenting on 07/19/2017 for Abdominal Pain (constant stomach pain. vomiting possibly related to sinus drainage, loose stool and lack of appetite. x 1 week ) and Sinus Problem (possible nasal drainage, cough, and nauseated. Insomnia )   HPI Sinus: Congestion, but is not primary concern. Has regular sinus drainage, but is not having persistent cough or infectious signs or symptoms.   Abdominal Pain: For last 1 week, pt has had constant abdominal pain and intermittent vomiting (only w/ increased congestion as he gags from coughing.   - Lack of appetite started w/ new job. 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  He finishes work anywhere between 11:30pm-7am.  Works 5-6 d per week.  OT is mandatory. He reports he has lost 10-20 lbs in last 1 month w/ new job at end of August. - He has watery stool, 1-2 x per x day.   No soft, formed stool anymore. - Abdominal pain occurs throughout the day without relationship to meals, but is worse when he lifts boxes.  He states he feels cramping pain. - Caffeine intake: He regularly consumes Energy Drinks and states he is now "addicted" drank 2 monsters before starting shift x 2 weeks.  When he stopped he had lots of fatigue, performance at work dropped.  Is no longer drinking these since the onset of abdominal pain.  Insomnia: He states he "sleeps anytime I'm off" because he is always tired.  Has been able to sleep well during the day until 1 week ago.  For past 1 week sleep has not been continuous.  Pt only sleeps 2-3 hours and is awake until work, which is not restful.  He has not had insomnia when previously sleeping at night and has worked swing shifts (day-night rotating) for several years.  GAD 7 : Generalized Anxiety Score 07/19/2017  Nervous, Anxious, on Edge 1    Control/stop worrying 1  Worry too much - different things 2  Trouble relaxing 0  Restless 2  Easily annoyed or irritable 2  Afraid - awful might happen 0  Total GAD 7 Score 8  Anxiety Difficulty Somewhat difficult    Depression screen [facility] 2/9 07/19/2017 08/04/2015  Decreased Interest 3 0  Down, Depressed, Hopeless 2 0  PHQ - 2 Score 5 0  Altered sleeping 3 -  Tired, decreased energy 3 -  Change in appetite 2 -  Feeling bad or failure about yourself  3 -  Trouble concentrating 0 -  Moving slowly or fidgety/restless 2 -  Suicidal thoughts 0 -  PHQ-9 Score 18 -  Difficult doing work/chores Somewhat difficult -    Social History   Tobacco Use  . Smoking status: Current Some Day Smoker    Packs/day: 0.25    Types: Cigarettes  . Smokeless tobacco: Never Used  . Tobacco comment: pt smoke pack a week  Substance Use Topics  . Alcohol use: No    Alcohol/week: 0.0 oz  . Drug use: No    Review of Systems Per HPI unless specifically indicated above     Objective:    BP 115/61 (BP Location: Right Arm, Patient Position: Sitting, Cuff Size: Normal)   Pulse 85   Temp 98.7 F (37.1 C) (Oral)   Ht 6' (1.829 m)   Wt  233 lb (105.7 kg)   BMI 31.60 kg/m   Wt Readings from Last 3 Encounters:  07/19/17 233 lb (105.7 kg)  05/21/17 257 lb (116.6 kg)  10/21/16 257 lb (116.6 kg)    Physical Exam General - overweight, well-appearing, NAD HEENT - Normocephalic, atraumatic Neck - supple, non-tender, no LAD, no thyromegaly Heart - RRR, no murmurs heard Lungs - Clear throughout all lobes, no wheezing, crackles, or rhonchi. Normal work of breathing. Abdomen - soft, NTND, no masses, no hepatosplenomegaly, active bowel sounds Extremeties - non-tender, no edema, cap refill < 2 seconds, peripheral pulses intact +2 bilaterally Skin - warm, dry Neuro - awake, alert, oriented x3, normal gait Psych - Normal mood and affect, normal behavior   Results for orders placed or performed during  the hospital encounter of 05/30/15  Urinalysis complete, with microscopic  Result Value Ref Range   Color, Urine YELLOW YELLOW   APPearance CLEAR CLEAR   Glucose, UA NEGATIVE NEGATIVE mg/dL   Bilirubin Urine NEGATIVE NEGATIVE   Ketones, ur NEGATIVE NEGATIVE mg/dL   Specific Gravity, Urine 1.010 1.005 - 1.030   Hgb urine dipstick NEGATIVE NEGATIVE   pH 5.5 5.0 - 8.0   Protein, ur NEGATIVE NEGATIVE mg/dL   Nitrite NEGATIVE NEGATIVE   Leukocytes, UA NEGATIVE NEGATIVE   RBC / HPF NONE SEEN <3 RBC/hpf   WBC, UA 0-5 <3 WBC/hpf   Bacteria, UA NONE SEEN (A) RARE   Squamous Epithelial / LPF 0-5 (A) RARE      Assessment & Plan:   Problem List Items Addressed This Visit      Other   Anxiety and depression - Primary    New onset anxiety and depression with increased PHQ 9 scores and gad 7 scores indicating severe anxiety and severe depression.  Onset of severe symptoms of abdominal pain, insomnia, anxiety and depression symptoms began with his new job.  ROS for abdominal pain provides direct link to onset of symptoms with new job and increased stress and no definitive explanation for abdominal cause.  Denies SI/HI and has no plans to carry them out if they do arise.  Plan: 1.  Discussed that abdominal pain may be caused by her changes in serotonin.  Patient verbalizes understanding and is willing to treat depression and anxiety first. -Start escitalopram 10 mg tablet.  Take half tablet once daily for 7 days.  Then, take 1 tablet once daily and continue. 2.  Discussed stress management strategies.  Encouraged mindfulness, deep breathing, exercise, healthy diet. 3.  Also discussed possibility of starting counseling.  Patient declines at this time. 4.  Follow-up in 4 weeks        Relevant Medications   escitalopram (LEXAPRO) 10 MG tablet   Other insomnia    Acute onset worsening insomnia over the last 1 week.  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  Sleep is  very disrupted and not restful.  Plan: 1.  Start melatonin 5 mg once daily at bedtime.  Use this for 2 weeks then stop.  Discussed intermittent use of this medication for circadian rhythm recent. 2.  Encourage good sleep hygiene tips including keeping room very dark.  Provided handout. 3.  Encourage regular eating pattern pain will not awaken from sleep with hunger. 4.  Follow-up in 4 weeks       Other Visit Diagnoses    Generalized abdominal pain       Acute abdominal pain which is likely related to anxiety vs IBS/IBD.  See AP anxiety.      Meds ordered this encounter  Medications  . escitalopram (LEXAPRO) 10 MG tablet    Sig: Take 1/2 tablet (5 mg total) once daily for 7 days.  Then, take 1 tablet (10 mg total) once daily and continue.    Dispense:  30 tablet    Refill:  2    Order Specific Question:   Supervising Provider    Answer:   Olin Hauser [[id number]]    Follow up plan: Return in about 4 weeks (around 08/16/2017) for anxiety and depression.  A total of 40 minutes was spent face-to-face with this patient. Greater than 50% of this time was spent in counseling and coordination of care with the patient.   Cassell Smiles, DNP, AGPCNP-BC Adult Gerontology Primary Care Nurse Practitioner [facility] 07/19/2017, 10:05 AM

## 2017-07-28 ENCOUNTER — Encounter: Payer: Self-pay | Admitting: Nurse Practitioner

## 2017-07-28 DIAGNOSIS — G4709 Other insomnia: Secondary | ICD-10-CM | POA: Insufficient documentation

## 2017-07-28 DIAGNOSIS — F32A Depression, unspecified: Secondary | ICD-10-CM | POA: Insufficient documentation

## 2017-07-28 DIAGNOSIS — F419 Anxiety disorder, unspecified: Secondary | ICD-10-CM | POA: Insufficient documentation

## 2017-07-28 DIAGNOSIS — F329 Major depressive disorder, single episode, unspecified: Secondary | ICD-10-CM | POA: Insufficient documentation

## 2017-07-28 NOTE — Assessment & Plan Note (Signed)
Acute onset worsening insomnia over the last 1 week.  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  Sleep is very disrupted and not restful.  Plan: 1.  Start melatonin 5 mg once daily at bedtime.  Use this for 2 weeks then stop.  Discussed intermittent use of this medication for circadian rhythm recent. 2.  Encourage good sleep hygiene tips including keeping room very dark.  Provided handout. 3.  Encourage regular eating pattern pain will not awaken from sleep with hunger. 4.  Follow-up in 4 weeks

## 2017-07-28 NOTE — Assessment & Plan Note (Addendum)
New onset anxiety and depression with increased PHQ 9 scores and gad 7 scores indicating severe anxiety and severe depression.  Onset of severe symptoms of abdominal pain, insomnia, anxiety and depression symptoms began with his new job.  ROS for abdominal pain provides direct link to onset of symptoms with new job and increased stress and no definitive explanation for abdominal cause.  Denies SI/HI and has no plans to carry them out if they do arise.  Plan: 1.  Discussed that abdominal pain may be caused by her changes in serotonin.  Patient verbalizes understanding and is willing to treat depression and anxiety first. -Start escitalopram 10 mg tablet.  Take half tablet once daily for 7 days.  Then, take 1 tablet once daily and continue. 2.  Discussed stress management strategies.  Encouraged mindfulness, deep breathing, exercise, healthy diet. 3.  Also discussed possibility of starting counseling.  Patient declines at this time. 4.  Follow-up in 4 weeks

## 2017-08-28 ENCOUNTER — Emergency Department: Payer: Self-pay

## 2017-08-28 ENCOUNTER — Emergency Department
Admission: EM | Admit: 2017-08-28 | Discharge: 2017-08-28 | Disposition: A | Discharge status: Home / Self Care | Payer: Self-pay | Attending: Emergency Medicine | Admitting: Emergency Medicine

## 2017-08-28 ENCOUNTER — Other Ambulatory Visit: Payer: Self-pay

## 2017-08-28 ENCOUNTER — Encounter: Payer: Self-pay | Admitting: Emergency Medicine

## 2017-08-28 DIAGNOSIS — R079 Chest pain, unspecified: Secondary | ICD-10-CM | POA: Insufficient documentation

## 2017-08-28 DIAGNOSIS — Z5321 Procedure and treatment not carried out due to patient leaving prior to being seen by health care provider: Secondary | ICD-10-CM | POA: Insufficient documentation

## 2017-08-28 HISTORY — DX: Major depressive disorder, single episode, unspecified: F32.9

## 2017-08-28 HISTORY — DX: Depression, unspecified: F32.A

## 2017-08-28 LAB — CBC
HEMATOCRIT: 45.5 % (ref 40.0–52.0)
Hemoglobin: 15.7 g/dL (ref 13.0–18.0)
MCH: 30.8 pg (ref 26.0–34.0)
MCHC: 34.6 g/dL (ref 32.0–36.0)
MCV: 89 fL (ref 80.0–100.0)
Platelets: 225 10*3/uL (ref 150–440)
RBC: 5.11 MIL/uL (ref 4.40–5.90)
RDW: 13.9 % (ref 11.5–14.5)
WBC: 10.9 10*3/uL — AB (ref 3.8–10.6)

## 2017-08-28 LAB — BASIC METABOLIC PANEL
Anion gap: 8 (ref 5–15)
BUN: 15 mg/dL (ref 6–20)
CHLORIDE: 105 mmol/L (ref 101–111)
CO2: 24 mmol/L (ref 22–32)
Calcium: 8.6 mg/dL — ABNORMAL LOW (ref 8.9–10.3)
Creatinine, Ser: 0.82 mg/dL (ref 0.61–1.24)
GFR calc Af Amer: >60 mL/min (ref >60)
GFR calc non Af Amer: >60 mL/min (ref >60)
GLUCOSE: 123 mg/dL — AB (ref 65–99)
Potassium: 3.7 mmol/L (ref 3.5–5.1)
Sodium: 137 mmol/L (ref 135–145)

## 2017-08-28 LAB — TROPONIN I: Troponin I: <0.03 ng/mL (ref <0.03)

## 2017-08-28 NOTE — ED Triage Notes (Signed)
Pt arrived to the ED for complaints of chest pain x1 year worse for the last week. Pt states that the pain causes shortness of breath. Pt is AOx4 very anxious during triage.

## 2017-08-30 ENCOUNTER — Ambulatory Visit: Payer: Self-pay | Admitting: Nurse Practitioner

## 2017-09-27 ENCOUNTER — Emergency Department: Admission: EM | Admit: 2017-09-27 | Discharge: 2017-09-27 | Discharge status: Against Medical Advice | Payer: Self-pay

## 2018-07-18 ENCOUNTER — Other Ambulatory Visit: Payer: Self-pay

## 2018-07-18 ENCOUNTER — Emergency Department
Admission: EM | Admit: 2018-07-18 | Discharge: 2018-07-18 | Disposition: A | Discharge status: Home / Self Care | Payer: Self-pay | Attending: Emergency Medicine | Admitting: Emergency Medicine

## 2018-07-18 ENCOUNTER — Emergency Department: Payer: Self-pay

## 2018-07-18 ENCOUNTER — Encounter: Payer: Self-pay | Admitting: Emergency Medicine

## 2018-07-18 DIAGNOSIS — R1110 Vomiting, unspecified: Secondary | ICD-10-CM | POA: Insufficient documentation

## 2018-07-18 DIAGNOSIS — K5792 Diverticulitis of intestine, part unspecified, without perforation or abscess without bleeding: Secondary | ICD-10-CM | POA: Insufficient documentation

## 2018-07-18 DIAGNOSIS — F17210 Nicotine dependence, cigarettes, uncomplicated: Secondary | ICD-10-CM | POA: Insufficient documentation

## 2018-07-18 DIAGNOSIS — Z79899 Other long term (current) drug therapy: Secondary | ICD-10-CM | POA: Insufficient documentation

## 2018-07-18 HISTORY — DX: Diverticulitis of intestine, part unspecified, without perforation or abscess without bleeding: K57.92

## 2018-07-18 LAB — CBC WITH DIFFERENTIAL/PLATELET
Abs Immature Granulocytes: 0.2 10*3/uL — ABNORMAL HIGH (ref 0.00–0.07)
BASOS ABS: 0.1 10*3/uL (ref 0.0–0.1)
Basophils Relative: 0 %
EOS ABS: 0.2 10*3/uL (ref 0.0–0.5)
EOS PCT: 1 %
HCT: 46.6 % (ref 39.0–52.0)
Hemoglobin: 16.4 g/dL (ref 13.0–17.0)
Immature Granulocytes: 1 %
Lymphocytes Relative: 7 %
Lymphs Abs: 1.4 10*3/uL (ref 0.7–4.0)
MCH: 30.4 pg (ref 26.0–34.0)
MCHC: 35.2 g/dL (ref 30.0–36.0)
MCV: 86.3 fL (ref 80.0–100.0)
Monocytes Absolute: 1.6 10*3/uL — ABNORMAL HIGH (ref 0.1–1.0)
Monocytes Relative: 7 %
NRBC: 0 % (ref 0.0–0.2)
Neutro Abs: 18.4 10*3/uL — ABNORMAL HIGH (ref 1.7–7.7)
Neutrophils Relative %: 84 %
Platelets: 228 10*3/uL (ref 150–400)
RBC: 5.4 MIL/uL (ref 4.22–5.81)
RDW: 12.5 % (ref 11.5–15.5)
WBC: 21.9 10*3/uL — AB (ref 4.0–10.5)

## 2018-07-18 LAB — COMPREHENSIVE METABOLIC PANEL
ALBUMIN: 4.6 g/dL (ref 3.5–5.0)
ALT: 32 U/L (ref 0–44)
ANION GAP: 11 (ref 5–15)
AST: 23 U/L (ref 15–41)
Alkaline Phosphatase: 90 U/L (ref 38–126)
BILIRUBIN TOTAL: 0.8 mg/dL (ref 0.3–1.2)
BUN: 8 mg/dL (ref 6–20)
CALCIUM: 9.5 mg/dL (ref 8.9–10.3)
CO2: 24 mmol/L (ref 22–32)
Chloride: 103 mmol/L (ref 98–111)
Creatinine, Ser: 0.75 mg/dL (ref 0.61–1.24)
GFR calc non Af Amer: >60 mL/min (ref >60)
GLUCOSE: 118 mg/dL — AB (ref 70–99)
POTASSIUM: 3.7 mmol/L (ref 3.5–5.1)
SODIUM: 138 mmol/L (ref 135–145)
TOTAL PROTEIN: 8 g/dL (ref 6.5–8.1)

## 2018-07-18 LAB — URINALYSIS, COMPLETE (UACMP) WITH MICROSCOPIC
BACTERIA UA: NONE SEEN
BILIRUBIN URINE: NEGATIVE
Glucose, UA: NEGATIVE mg/dL
HGB URINE DIPSTICK: NEGATIVE
KETONES UR: NEGATIVE mg/dL
LEUKOCYTES UA: NEGATIVE
NITRITE: NEGATIVE
PROTEIN: NEGATIVE mg/dL
SQUAMOUS EPITHELIAL / LPF: NONE SEEN (ref 0–5)
Specific Gravity, Urine: 1.01 (ref 1.005–1.030)
pH: 9 — ABNORMAL HIGH (ref 5.0–8.0)

## 2018-07-18 LAB — LIPASE, BLOOD: Lipase: 25 U/L (ref 11–51)

## 2018-07-18 MED ORDER — ONDANSETRON 4 MG PO TBDP: ORAL_TABLET | ORAL | 0 refills | Status: DC

## 2018-07-18 MED ORDER — IOPAMIDOL (ISOVUE-300) INJECTION 61%
15.0000 mL | INTRAVENOUS | Status: AC
Start: 2018-07-18 — End: 2018-07-18
  Administered 2018-07-18 (×2): 15 mL via ORAL

## 2018-07-18 MED ORDER — AMOXICILLIN-POT CLAVULANATE 400-57 MG/5ML PO SUSR
11.0000 mL | ORAL | Status: AC
  Administered 2018-07-18: 11 mL via ORAL
  Filled 2018-07-18 (×3): qty 11

## 2018-07-18 MED ORDER — AMOXICILLIN-POT CLAVULANATE 400-57 MG/5ML PO SUSR: 11.0000 mL | Freq: Three times a day (TID) | ORAL | 0 refills | Status: DC

## 2018-07-18 MED ORDER — IOHEXOL 300 MG/ML  SOLN
100.0000 mL | Freq: Once | INTRAMUSCULAR | Status: AC | PRN
  Administered 2018-07-18: 100 mL via INTRAVENOUS

## 2018-07-18 NOTE — ED Provider Notes (Signed)
[facility] Emergency Department Provider Note  ____________________________________________   First MD Initiated Contact with Patient 07/18/18 0127     (approximate)  I have reviewed the triage vital signs and the nursing notes.   HISTORY  Chief Complaint Abdominal Pain    HPI Timothy [name] is a 22 y.o. male with a history of sigmoid diverticulitis with small contained perforation diagnosed at [facility] about 5 or 6 months ago.  He presents by private vehicle for evaluation of worsening lower abdominal pain for about 2 days.  He has had normal stools.  He had one episode of vomiting but it was after he drinks some mag citrate.  He states that the pain becomes severe when he is having a bowel movement but otherwise it is mild to moderate.  Moving around and pushing on his abdomen make it worse.  He denies fever/chills, chest pain, shortness of breath, and dysuria.  He states that he was going to have surgery in the summer but they decided to not do the surgery.  He is also supposed to have a follow-up colonoscopy but that has not yet happened.  Past Medical History:  Diagnosis Date  . Allergy   . Depression   . Diverticulitis   . Fainting spell    in middle school    Patient Active Problem List   Diagnosis Date Noted  . Anxiety and depression 07/28/2017  . Other insomnia 07/28/2017  . Smoker 08/04/2015  . Obesity 08/04/2015  . Allergic rhinitis due to pollen 08/04/2015    History reviewed. No pertinent surgical history.  Prior to Admission medications   Medication Sig Start Date End Date Taking? Authorizing Provider  amoxicillin-clavulanate (AUGMENTIN) 400-57 MG/5ML suspension Take 11 mLs by mouth 3 (three) times daily for 10 days. 07/18/18 07/28/18  Hinda Kehr, MD  escitalopram (LEXAPRO) 10 MG tablet Take 1/2 tablet (5 mg total) once daily for 7 days.  Then, take 1 tablet (10 mg total) once daily and continue. 07/19/17   Mikey College,  NP  fluticasone (FLONASE) 50 MCG/ACT nasal spray Place 2 sprays into both nostrils daily. Patient not taking: Reported on 07/19/2017 05/21/17 05/21/18  Laban Emperor, PA-C  ondansetron (ZOFRAN ODT) 4 MG disintegrating tablet Allow 1-2 tablets to dissolve in your mouth every 8 hours as needed for nausea/vomiting 07/18/18   Hinda Kehr, MD  promethazine (PHENERGAN) 12.5 MG tablet Take 1 tablet (12.5 mg total) by mouth every 8 (eight) hours as needed for nausea or vomiting. Patient not taking: Reported on 07/19/2017 10/21/16   Trinna Post, PA-C    Allergies Patient has no known allergies.  Family History  Problem Relation Age of Onset  . Heart disease Maternal Grandmother   . Diabetes Maternal Grandmother   . Cancer Maternal Grandmother        breast cancer  . BRCA 1/2 Maternal Grandmother   . Cancer Paternal Grandfather   . Healthy Sister   . BRCA 1/2 Maternal Aunt   . Depression Mother     Social History Social History   Tobacco Use  . Smoking status: Current Some Day Smoker    Packs/day: 0.25    Types: Cigarettes  . Smokeless tobacco: Never Used  . Tobacco comment: pt smoke pack a week  Substance Use Topics  . Alcohol use: No    Alcohol/week: 0.0 standard drinks  . Drug use: No    Review of Systems Constitutional: No fever/chills Eyes: No visual changes. ENT: No sore throat.  Cardiovascular: Denies chest pain. Respiratory: Denies shortness of breath. Gastrointestinal: Abdominal pain as described above.  One episode of vomiting after drinking mag citrate.  Normal bowel movements.  Pain is worse with bowel movements. Genitourinary: Negative for dysuria. Musculoskeletal: Negative for neck pain.  Negative for back pain. Integumentary: Negative for rash. Neurological: Negative for headaches, focal weakness or numbness.   ____________________________________________   PHYSICAL EXAM:  VITAL SIGNS: ED Triage Vitals  Enc Vitals Group     BP 07/18/18 0006 135/72      Pulse Rate 07/18/18 0006 90     Resp 07/18/18 0006 20     Temp 07/18/18 0006 98.7 F (37.1 C)     Temp Source 07/18/18 0006 Oral     SpO2 07/18/18 0006 97 %     Weight 07/18/18 0004 117.9 kg (260 lb)     Height 07/18/18 0004 1.829 m (6')     Head Circumference --      Peak Flow --      Pain Score 07/18/18 0004 8     Pain Loc --      Pain Edu? --      Excl. in [place]? --     Constitutional: Alert and oriented. Well appearing and in no acute distress. Eyes: Conjunctivae are normal.  Head: Atraumatic. Nose: No congestion/rhinnorhea. Mouth/Throat: Mucous membranes are moist. Neck: No stridor.  No meningeal signs.   Cardiovascular: Normal rate, regular rhythm. Good peripheral circulation. Grossly normal heart sounds. Respiratory: Normal respiratory effort.  No retractions. Lungs CTAB. Gastrointestinal: Soft and nondistended.  Tender to palpation throughout the lower abdomen but exquisitely tender in the right lower quadrant.  Some guarding, no rebound. Musculoskeletal: No lower extremity tenderness nor edema. No gross deformities of extremities. Neurologic:  Normal speech and language. No gross focal neurologic deficits are appreciated.  Skin:  Skin is warm, dry and intact. No rash noted. Psychiatric: Mood and affect are normal. Speech and behavior are normal.  ____________________________________________   LABS (all labs ordered are listed, but only abnormal results are displayed)  Labs Reviewed  URINALYSIS, COMPLETE (UACMP) WITH MICROSCOPIC - Abnormal; Notable for the following components:      Result Value   Color, Urine YELLOW (*)    APPearance CLEAR (*)    pH 9.0 (*)    All other components within normal limits  CBC WITH DIFFERENTIAL/PLATELET - Abnormal; Notable for the following components:   WBC 21.9 (*)    Neutro Abs 18.4 (*)    Monocytes Absolute 1.6 (*)    Abs Immature Granulocytes 0.20 (*)    All other components within normal limits  COMPREHENSIVE METABOLIC  PANEL - Abnormal; Notable for the following components:   Glucose, Bld 118 (*)    All other components within normal limits  LIPASE, BLOOD   ____________________________________________  EKG  None - EKG not ordered by ED physician ____________________________________________  RADIOLOGY   ED MD interpretation: Acute uncomplicated sigmoid diverticulitis with no abscess  Official radiology report(s): Ct Abdomen Pelvis W Contrast  Result Date: 07/18/2018 CLINICAL DATA:  Lower abdominal pain for 2 days. Vomited after drinking Mag citrate today. EXAM: CT ABDOMEN AND PELVIS WITH CONTRAST TECHNIQUE: Multidetector CT imaging of the abdomen and pelvis was performed using the standard protocol following bolus administration of intravenous contrast. CONTRAST:  136m OMNIPAQUE IOHEXOL 300 MG/ML  SOLN COMPARISON:  None. FINDINGS: Lower chest: Lung bases are clear. Hepatobiliary: Mild diffuse fatty infiltration of the liver. No focal liver abnormality is seen. No gallstones, gallbladder  wall thickening, or biliary dilatation. Pancreas: Unremarkable. No pancreatic ductal dilatation or surrounding inflammatory changes. Spleen: Normal in size without focal abnormality. Adrenals/Urinary Tract: Adrenal glands are unremarkable. Kidneys are normal, without renal calculi, focal lesion, or hydronephrosis. Bladder is unremarkable. Stomach/Bowel: Stomach, small bowel, and colon are not abnormally distended. Appendix is normal. Prominent diverticulum in the sigmoid colon with surrounding fat stranding and edema consistent with acute diverticulitis. No abscess. Small amount of free fluid in the pelvis is likely reactive. Vascular/Lymphatic: No significant vascular findings are present. No enlarged abdominal or pelvic lymph nodes. Reproductive: Prostate is unremarkable. Other: No free air in the abdomen. Abdominal wall musculature appears intact. Musculoskeletal: No acute or significant osseous findings. IMPRESSION: 1.  Changes of acute diverticulitis in the sigmoid colon. No abscess. 2. Mild diffuse fatty infiltration of the liver. Electronically Signed   By: Lucienne Capers M.D.   On: 07/18/2018 04:23    ____________________________________________   PROCEDURES  Critical Care performed: No   Procedure(s) performed:   Procedures   ____________________________________________   INITIAL IMPRESSION / ASSESSMENT AND PLAN / ED COURSE  As part of my medical decision making, I reviewed the following data within the [place] History obtained from family, Nursing notes reviewed and incorporated, Labs reviewed , Old chart reviewed and Notes from prior ED visits    Differential diagnosis includes, but is not limited to, acute diverticulitis, acute appendicitis, UTI/pyelonephritis, mesenteric adenitis.  Based on his history I suspect he has a recurrence of the diverticulitis although appendicitis is certainly possible as well.  I reviewed the CT scan report and labs from his [facility] visit and it does appear that he had a small microperforation with a contained abscess.  He never had surgery or drainage.  His leukocytosis seems to have maxed out around 14 at [facility] but tonight it is almost 22.  However he is afebrile with stable vital signs and generally well-appearing.  I will obtain a CT scan with oral and IV contrast for optimal evaluation of both appendicitis and diverticulitis.  Patient understands and agrees with the plan.  No indication for fluids at this time.  Holding on antibiotics until I know the diagnosis.  Clinical Course as of Jul 18 608  Wed Jul 18, 2018  6010 negative UA  Urinalysis, Complete w Microscopic(!) [CF]  [phone] Patient ambulating around the department, no apparent distress.  Asked to go back to his room.   [CF]  0515 Patient continues to ambulate around the department (he wants to go smoke) and is in no apparent distress.  His CT scan was notable for acute uncomplicated  diverticulitis with no evidence of abscess.  He is nontoxic in appearance and I think that he will do well with outpatient antibiotics.  He reports that he cannot swallow pills so I am having to prescribe for him a liquid suspension of Augmentin at the appropriate dosage for diverticulitis treatment.  As per his request, I am giving him information about GI and surgery follow-up in [place] because he does not want to go back to [facility].  I also gave him coupons from [employer] to help with the cost of the prescriptions.  I gave my usual and customary return precautions.  CT ABDOMEN PELVIS W CONTRAST [CF]    Clinical Course User Index [CF] Hinda Kehr, MD    ____________________________________________  FINAL CLINICAL IMPRESSION(S) / ED DIAGNOSES  Final diagnoses:  Acute diverticulitis     MEDICATIONS GIVEN DURING THIS VISIT:  Medications  iopamidol (ISOVUE-300) 61 % injection 15 mL (15 mLs Oral Contrast Given 07/18/18 0320)  iohexol (OMNIPAQUE) 300 MG/ML solution 100 mL (100 mLs Intravenous Contrast Given 07/18/18 0404)  amoxicillin-clavulanate (AUGMENTIN) 400-57 MG/5ML suspension 11 mL (11 mLs Oral Given 07/18/18 0555)     ED Discharge Orders         Ordered    amoxicillin-clavulanate (AUGMENTIN) 400-57 MG/5ML suspension  3 times daily     07/18/18 0520    ondansetron (ZOFRAN ODT) 4 MG disintegrating tablet     07/18/18 0520           Note:  This document was prepared using Dragon voice recognition software and may include unintentional dictation errors.    Hinda Kehr, MD 07/18/18 [phone]

## 2018-07-18 NOTE — ED Notes (Signed)
Pt unable to void at this time. 

## 2018-07-18 NOTE — ED Notes (Signed)
Pt reports lower abd pain for 2 days.  Pt reports 3 BM's today.  Hx diverticulitis.  No blood in stool.  Vomited x 1 after drinking mag citrate today.  No back pain.  Pt alert.

## 2018-07-18 NOTE — Discharge Instructions (Signed)
As we discussed, you have diverticulitis again, and infection of the lining of your sigmoid colon.  Fortunately there is no evidence of abscess this time.  Please take the full 10-day course of your antibiotics.  Use the nausea medicine as needed.  We recommend you also take a stool softener such as Colace or docusate (available over-the-counter) to make your bowel movements easier.  You may follow-up in [facility] or call the numbers provided to establish gastroenterology and surgery care in [place].  When you call them, let them know you were seen in the emergency department, have been seen before in [facility], but would like to establish care in [place].    Return to the emergency department if you develop new or worsening symptoms that concern you.

## 2018-07-18 NOTE — ED Notes (Signed)
ED Provider at bedside. 

## 2018-07-18 NOTE — ED Notes (Signed)
Report off to lorrie rn

## 2018-07-18 NOTE — ED Triage Notes (Signed)
Patient ambulatory to triage with steady gait, without difficulty or distress noted; pt reports lower abd pain last several days with no accomp symptoms ; hx diverticulitis

## 2018-07-21 ENCOUNTER — Inpatient Hospital Stay
Admission: EM | Admit: 2018-07-21 | Discharge: 2018-07-28 | DRG: 330 | Disposition: A | Discharge status: Home Health | Payer: Self-pay | Attending: General Surgery | Admitting: General Surgery

## 2018-07-21 ENCOUNTER — Emergency Department: Payer: Self-pay

## 2018-07-21 ENCOUNTER — Other Ambulatory Visit: Payer: Self-pay

## 2018-07-21 ENCOUNTER — Encounter: Payer: Self-pay | Admitting: Emergency Medicine

## 2018-07-21 DIAGNOSIS — Z818 Family history of other mental and behavioral disorders: Secondary | ICD-10-CM

## 2018-07-21 DIAGNOSIS — J301 Allergic rhinitis due to pollen: Secondary | ICD-10-CM | POA: Diagnosis present

## 2018-07-21 DIAGNOSIS — Z833 Family history of diabetes mellitus: Secondary | ICD-10-CM

## 2018-07-21 DIAGNOSIS — Z8249 Family history of ischemic heart disease and other diseases of the circulatory system: Secondary | ICD-10-CM

## 2018-07-21 DIAGNOSIS — Z79899 Other long term (current) drug therapy: Secondary | ICD-10-CM

## 2018-07-21 DIAGNOSIS — F17210 Nicotine dependence, cigarettes, uncomplicated: Secondary | ICD-10-CM | POA: Diagnosis present

## 2018-07-21 DIAGNOSIS — G4700 Insomnia, unspecified: Secondary | ICD-10-CM | POA: Diagnosis present

## 2018-07-21 DIAGNOSIS — F329 Major depressive disorder, single episode, unspecified: Secondary | ICD-10-CM | POA: Diagnosis present

## 2018-07-21 DIAGNOSIS — Z803 Family history of malignant neoplasm of breast: Secondary | ICD-10-CM

## 2018-07-21 DIAGNOSIS — K5720 Diverticulitis of large intestine with perforation and abscess without bleeding: Principal | ICD-10-CM | POA: Diagnosis present

## 2018-07-21 DIAGNOSIS — K567 Ileus, unspecified: Secondary | ICD-10-CM | POA: Diagnosis not present

## 2018-07-21 DIAGNOSIS — F419 Anxiety disorder, unspecified: Secondary | ICD-10-CM | POA: Diagnosis present

## 2018-07-21 DIAGNOSIS — Z7951 Long term (current) use of inhaled steroids: Secondary | ICD-10-CM

## 2018-07-21 LAB — URINALYSIS, COMPLETE (UACMP) WITH MICROSCOPIC
Bacteria, UA: NONE SEEN
Bilirubin Urine: NEGATIVE
GLUCOSE, UA: NEGATIVE mg/dL
HGB URINE DIPSTICK: NEGATIVE
KETONES UR: NEGATIVE mg/dL
LEUKOCYTES UA: NEGATIVE
Nitrite: NEGATIVE
PH: 6 (ref 5.0–8.0)
PROTEIN: NEGATIVE mg/dL
Specific Gravity, Urine: >1.046 — ABNORMAL HIGH (ref 1.005–1.030)
Squamous Epithelial / LPF: NONE SEEN (ref 0–5)
WBC, UA: NONE SEEN WBC/hpf (ref 0–5)

## 2018-07-21 LAB — CBC
HCT: 43.2 % (ref 39.0–52.0)
Hemoglobin: 15 g/dL (ref 13.0–17.0)
MCH: 30.2 pg (ref 26.0–34.0)
MCHC: 34.7 g/dL (ref 30.0–36.0)
MCV: 87.1 fL (ref 80.0–100.0)
NRBC: 0 % (ref 0.0–0.2)
PLATELETS: 251 10*3/uL (ref 150–400)
RBC: 4.96 MIL/uL (ref 4.22–5.81)
RDW: 12.2 % (ref 11.5–15.5)
WBC: 13.8 10*3/uL — AB (ref 4.0–10.5)

## 2018-07-21 LAB — COMPREHENSIVE METABOLIC PANEL
ALK PHOS: 75 U/L (ref 38–126)
ALT: 31 U/L (ref 0–44)
ANION GAP: 12 (ref 5–15)
AST: 21 U/L (ref 15–41)
Albumin: 4.2 g/dL (ref 3.5–5.0)
BILIRUBIN TOTAL: 0.9 mg/dL (ref 0.3–1.2)
BUN: 11 mg/dL (ref 6–20)
CALCIUM: 8.9 mg/dL (ref 8.9–10.3)
CO2: 24 mmol/L (ref 22–32)
CREATININE: 0.77 mg/dL (ref 0.61–1.24)
Chloride: 99 mmol/L (ref 98–111)
GFR calc Af Amer: >60 mL/min (ref >60)
GFR calc non Af Amer: >60 mL/min (ref >60)
Glucose, Bld: 94 mg/dL (ref 70–99)
Potassium: 3.7 mmol/L (ref 3.5–5.1)
SODIUM: 135 mmol/L (ref 135–145)
Total Protein: 7.9 g/dL (ref 6.5–8.1)

## 2018-07-21 LAB — LIPASE, BLOOD: Lipase: 26 U/L (ref 11–51)

## 2018-07-21 MED ORDER — SODIUM CHLORIDE 0.9 % IV SOLN
Freq: Once | INTRAVENOUS | Status: AC
  Administered 2018-07-21: 20:00:00 via INTRAVENOUS

## 2018-07-21 MED ORDER — DEXTROSE IN LACTATED RINGERS 5 % IV SOLN
INTRAVENOUS | Status: DC
  Administered 2018-07-22 – 2018-07-24 (×7): via INTRAVENOUS

## 2018-07-21 MED ORDER — MORPHINE SULFATE (PF) 4 MG/ML IV SOLN
4.0000 mg | Freq: Once | INTRAVENOUS | Status: AC
  Administered 2018-07-21: 4 mg via INTRAVENOUS
  Filled 2018-07-21: qty 1

## 2018-07-21 MED ORDER — NICOTINE 14 MG/24HR TD PT24
MEDICATED_PATCH | TRANSDERMAL | Status: AC
  Filled 2018-07-21: qty 1

## 2018-07-21 MED ORDER — ENOXAPARIN SODIUM 40 MG/0.4ML ~~LOC~~ SOLN
40.0000 mg | SUBCUTANEOUS | Status: DC
  Administered 2018-07-22 – 2018-07-27 (×6): 40 mg via SUBCUTANEOUS
  Filled 2018-07-21 (×7): qty 0.4

## 2018-07-21 MED ORDER — PIPERACILLIN-TAZOBACTAM 3.375 G IVPB
3.3750 g | Freq: Three times a day (TID) | INTRAVENOUS | Status: DC
  Administered 2018-07-21 – 2018-07-25 (×11): 3.375 g via INTRAVENOUS
  Filled 2018-07-21 (×11): qty 50

## 2018-07-21 MED ORDER — NICOTINE 14 MG/24HR TD PT24
14.0000 mg | MEDICATED_PATCH | Freq: Every day | TRANSDERMAL | Status: DC
  Filled 2018-07-21 (×4): qty 1

## 2018-07-21 MED ORDER — IOPAMIDOL (ISOVUE-300) INJECTION 61%
100.0000 mL | Freq: Once | INTRAVENOUS | Status: AC | PRN
  Administered 2018-07-21: 100 mL via INTRAVENOUS

## 2018-07-21 MED ORDER — ONDANSETRON HCL 4 MG/2ML IJ SOLN
4.0000 mg | Freq: Once | INTRAMUSCULAR | Status: AC
  Administered 2018-07-21: 4 mg via INTRAVENOUS
  Filled 2018-07-21: qty 2

## 2018-07-21 MED ORDER — MORPHINE SULFATE (PF) 4 MG/ML IV SOLN
INTRAVENOUS | Status: AC
  Filled 2018-07-21: qty 1

## 2018-07-21 MED ORDER — MORPHINE SULFATE (PF) 4 MG/ML IV SOLN
4.0000 mg | Freq: Once | INTRAVENOUS | Status: AC
  Administered 2018-07-21: 4 mg via INTRAVENOUS

## 2018-07-21 MED ORDER — ONDANSETRON HCL 4 MG/2ML IJ SOLN
4.0000 mg | Freq: Four times a day (QID) | INTRAMUSCULAR | Status: DC | PRN
  Administered 2018-07-21: 4 mg via INTRAVENOUS

## 2018-07-21 MED ORDER — PIPERACILLIN-TAZOBACTAM 3.375 G IVPB 30 MIN
3.3750 g | Freq: Once | INTRAVENOUS | Status: AC
  Administered 2018-07-21: 3.375 g via INTRAVENOUS
  Filled 2018-07-21: qty 50

## 2018-07-21 MED ORDER — HYDROMORPHONE HCL 1 MG/ML IJ SOLN
INTRAMUSCULAR | Status: AC
  Administered 2018-07-21: 1 mg via INTRAVENOUS
  Filled 2018-07-21: qty 1

## 2018-07-21 MED ORDER — LORAZEPAM 2 MG/ML IJ SOLN
0.5000 mg | INTRAMUSCULAR | Status: DC | PRN
  Administered 2018-07-21 – 2018-07-22 (×2): 0.5 mg via INTRAVENOUS
  Filled 2018-07-21 (×3): qty 1

## 2018-07-21 MED ORDER — ONDANSETRON HCL 4 MG/2ML IJ SOLN
INTRAMUSCULAR | Status: AC
  Administered 2018-07-21: 4 mg via INTRAVENOUS
  Filled 2018-07-21: qty 2

## 2018-07-21 MED ORDER — ONDANSETRON 4 MG PO TBDP: 4.0000 mg | ORAL_TABLET | Freq: Four times a day (QID) | ORAL | Status: DC | PRN

## 2018-07-21 MED ORDER — HYDROMORPHONE HCL 1 MG/ML IJ SOLN
1.0000 mg | INTRAMUSCULAR | Status: DC | PRN
  Administered 2018-07-21 – 2018-07-22 (×4): 1 mg via INTRAVENOUS
  Administered 2018-07-22: .25 mg via INTRAVENOUS
  Administered 2018-07-22: 1 mg via INTRAVENOUS
  Administered 2018-07-22: .25 mg via INTRAVENOUS
  Administered 2018-07-22: 1 mg via INTRAVENOUS
  Administered 2018-07-22 (×2): .25 mg via INTRAVENOUS
  Administered 2018-07-23 (×3): 1 mg via INTRAVENOUS
  Filled 2018-07-21 (×8): qty 1

## 2018-07-21 NOTE — ED Notes (Signed)
.   Pt is resting, Respirations even and unlabored, NAD. Stretcher lowest postion and locked. Call bell within reach. Denies any needs at this time RN will continue to monitor. EDP at bedside answering questions

## 2018-07-21 NOTE — H&P (Signed)
Subjective:  Patient 22 y.o. male presents with abdominal pain. Onset of symptoms was  3 days ago.  He presented to the ED at [facility] and CT scan showed uncomplicated diverticulitis.  He was sent home on oral antibiotics and initially improved. Last night, however, he ate fast food and noted acute onset of sharp pain.  He returned to the ED where a repeat CT scan showed perforated diverticulitis with a modest amount of free air. He actually had a prior episode of uncomplicated diverticulitis earlier this year and was treated at [facility], however he failed to follow up with the plan of care.  Currently in the ED, he is hemodynamically stable, walking around the room, and had just finished a bottle of soda prior to my arrival.  He states that the pain is located in his low pelvis and is worse with movement.  IV morphine only provides short-lived pain relief, though it does help.  Patient Active Problem List   Diagnosis Date Noted  . Diverticulitis of colon with perforation 07/21/2018  . Anxiety and depression 07/28/2017  . Other insomnia 07/28/2017  . Smoker 08/04/2015  . Obesity 08/04/2015  . Allergic rhinitis due to pollen 08/04/2015   Past Medical History:  Diagnosis Date  . Allergy   . Depression   . Diverticulitis   . Fainting spell    in middle school    History reviewed. No pertinent surgical history.   (Not in a hospital admission) No Known Allergies  Social History   Tobacco Use  . Smoking status: Current Some Day Smoker    Packs/day: 0.25    Types: Cigarettes  . Smokeless tobacco: Never Used  . Tobacco comment: pt smoke pack a week  Substance Use Topics  . Alcohol use: No    Alcohol/week: 0.0 standard drinks    Family History  Problem Relation Age of Onset  . Heart disease Maternal Grandmother   . Diabetes Maternal Grandmother   . Cancer Maternal Grandmother        breast cancer  . BRCA 1/2 Maternal Grandmother   . Cancer Paternal Grandfather   . Healthy Sister   . BRCA  1/2 Maternal Aunt   . Depression Mother     Review of Systems Pertinent items are noted in HPI.  Objective:   Patient Vitals for the past 8 hrs:  BP Temp Temp src Pulse Resp SpO2 Height Weight  07/21/18 1605 - - - 92 - 96 % - -  07/21/18 1604 - - - 88 - 96 % - -  07/21/18 1603 - - - 92 - 96 % - -  07/21/18 1602 - - - 99 - 96 % - -  07/21/18 1601 113/75 - - - - - - -  07/21/18 1348 - - - - - - 6' (1.829 m) 117.9 kg  07/21/18 1347 (!) 132/101 98.4 F (36.9 C) Oral 60 18 97 % - -   BP 113/75   Pulse 92   Temp 98.4 F (36.9 C) (Oral)   Resp 18   Ht 6' (1.829 m)   Wt 117.9 kg   SpO2 96%   BMI 35.26 kg/m   General Appearance:    Alert, cooperative, no distress, appears stated age  Head:    Normocephalic, without obvious abnormality, atraumatic  Eyes:    PERRL, no scleral icterus or discharge       Ears:    Not examined  Nose:   Normal to external evaluation  Throat:  Lips, mucosa, and tongue normal; teeth and gums normal  Neck:   Supple, symmetrical, trachea midline, no adenopathy;       thyroid:  No enlargement/tenderness/nodules; no carotid   bruit or JVD  Back:     Symmetric, no curvature, ROM normal, no CVA tenderness  Lungs:     Clear to auscultation bilaterally, respirations unlabored  Chest wall:    No tenderness or deformity  Heart:    Regular rate and rhythm, S1 and S2 normal, no murmur, rub   or gallop  Abdomen:     Soft, moderately tender to deep palpation.  Rebound tenderness present.   no masses, no organomegaly  Genitalia:    deferred  Rectal:    deferred  Extremities:   Extremities normal, atraumatic, no cyanosis or edema  Pulses:   2+ and symmetric all extremities  Skin:   Skin color, texture, turgor normal, no rashes or lesions  Lymph nodes:   Cervical, supraclavicular, and axillary nodes normal  Neurologic:   CNII-XII intact. Normal strength, sensation and reflexes      throughout    Data Review Results for Timothy [name] (MRN [id number]) as of  07/21/2018 18:49  Ref. Range 07/18/2018 00:08 07/18/2018 04:16 07/21/2018 13:50  Sodium Latest Ref Range: 135 - 145 mmol/L 138  135  Potassium Latest Ref Range: 3.5 - 5.1 mmol/L 3.7  3.7  Chloride Latest Ref Range: 98 - 111 mmol/L 103  99  CO2 Latest Ref Range: 22 - 32 mmol/L 24  24  Glucose Latest Ref Range: 70 - 99 mg/dL 118 (H)  94  BUN Latest Ref Range: 6 - 20 mg/dL 8  11  Creatinine Latest Ref Range: 0.61 - 1.24 mg/dL 0.75  0.77  Calcium Latest Ref Range: 8.9 - 10.3 mg/dL 9.5  8.9  Anion gap Latest Ref Range: 5 - '15  11  12  ' Alkaline Phosphatase Latest Ref Range: 38 - 126 U/L 90  75  Albumin Latest Ref Range: 3.5 - 5.0 g/dL 4.6  4.2  Lipase Latest Ref Range: 11 - 51 U/L 25  26  AST Latest Ref Range: 15 - 41 U/L 23  21  ALT Latest Ref Range: 0 - 44 U/L 32  31  Total Protein Latest Ref Range: 6.5 - 8.1 g/dL 8.0  7.9  Total Bilirubin Latest Ref Range: 0.3 - 1.2 mg/dL 0.8  0.9  GFR, Est Non African American Latest Ref Range: >60 mL/min >60  >60  GFR, Est African American Latest Ref Range: >60 mL/min >60  >60  WBC Latest Ref Range: 4.0 - 10.5 K/uL 21.9 (H)  13.8 (H)  RBC Latest Ref Range: 4.22 - 5.81 MIL/uL 5.40  4.96  Hemoglobin Latest Ref Range: 13.0 - 17.0 g/dL 16.4  15.0  HCT Latest Ref Range: 39.0 - 52.0 % 46.6  43.2  MCV Latest Ref Range: 80.0 - 100.0 fL 86.3  87.1  MCH Latest Ref Range: 26.0 - 34.0 pg 30.4  30.2  MCHC Latest Ref Range: 30.0 - 36.0 g/dL 35.2  34.7  RDW Latest Ref Range: 11.5 - 15.5 % 12.5  12.2  Platelets Latest Ref Range: 150 - 400 K/uL 228  251  nRBC Latest Ref Range: 0.0 - 0.2 % 0.0  0.0    CT abdomen: Stomach/Bowel: Normal appearance of the stomach. No pathologic dilatation of small bowel or colon. In the mid sigmoid colon in the area of previously recognized acute diverticulitis there continues to be extensive mural thickening and inflammatory  changes adjacent to the inflamed diverticulum (axial image 80 of series 2). However, today's study now  demonstrates a gas and fluid containing collection associated with the sigmoid mesocolon measuring approximately 5.5 x 1.7 x 2.3 cm (axial image 76 of series 2 and coronal image 52 of series 5). Normal appendix.  I personally reviewed the images from both today's study and that performed at his prior ED visit.  Assessment:  Active Problems:   Diverticulitis of colon with perforation   Plan: Despite the presence of free air, Mr. Sheffler' exam is remarkably mild and his leukocytosis is actually improved from prior.  He is hemodynamically stable.  Will admit tonight and take him to the OR first thing in the morning for a planned ex lap, partial colectomy and ostomy.  He and his mother were understandably upset about the prospect of the ostomy, however, in the setting of as much inflammation as is seen on CT, I do not feel it would be prudent to attempt reanastomosis at this time.  He was reassured that there was the possibility of ostomy reversal in the future.  The risks, benefits, complications, treatment options, and expected outcomes were discussed with the patient. The possibilities of  bleeding, recurrent infection, injury to surrounding structures, including intestine, ureter, kidney, blood vessels, the need for additional procedures, need for ostomy, possibility that the ostomy may not be able to be reversed in the future.  He was also cautioned that if he worsens overnight, we may need to proceed emergently to the OR.  Admit to Surgery NPO IVF Zosyn IV narcotics SCDs and SQ lovenox Consent obtained for surgery  Fredirick Maudlin 7:13 PM 07/21/18

## 2018-07-21 NOTE — Anesthesia Preprocedure Evaluation (Addendum)
Anesthesia Evaluation  Patient identified by MRN, date of birth, ID band Patient awake    Reviewed: Allergy & Precautions, NPO status , Patient's Chart, lab work & pertinent test results  Airway Mallampati: II  TM Distance: >3 FB     Dental  (+) Teeth Intact   Pulmonary Current Smoker,    Pulmonary exam normal        Cardiovascular negative cardio ROS Normal cardiovascular exam     Neuro/Psych PSYCHIATRIC DISORDERS Anxiety Depression negative neurological ROS     GI/Hepatic Neg liver ROS,   Endo/Other  negative endocrine ROS  Renal/GU negative Renal ROS  negative genitourinary   Musculoskeletal negative musculoskeletal ROS (+)   Abdominal Normal abdominal exam  (+)   Peds negative pediatric ROS (+)  Hematology negative hematology ROS (+)   Anesthesia Other Findings Past Medical History: No date: Allergy No date: Depression No date: Diverticulitis No date: Fainting spell     Comment:  in middle school  Reproductive/Obstetrics                             Anesthesia Physical Anesthesia Plan  ASA: II and emergent  Anesthesia Plan: General   Post-op Pain Management:    Induction: Intravenous, Rapid sequence and Cricoid pressure planned  PONV Risk Score and Plan:   Airway Management Planned: Oral ETT  Additional Equipment:   Intra-op Plan:   Post-operative Plan:   Informed Consent: I have reviewed the patients History and Physical, chart, labs and discussed the procedure including the risks, benefits and alternatives for the proposed anesthesia with the patient or authorized representative who has indicated his/her understanding and acceptance.   Dental advisory given  Plan Discussed with: CRNA and Surgeon  Anesthesia Plan Comments:        Anesthesia Quick Evaluation

## 2018-07-21 NOTE — ED Provider Notes (Signed)
[facility] Emergency Department Provider Note   ____________________________________________    I have reviewed the triage vital signs and the nursing notes.   HISTORY  Chief Complaint Abdominal Pain     HPI Timothy [name] is a 22 y.o. male with a history of diverticulitis recently seen in our emergency department 3 days ago and diagnosed with acute diverticuli this via CT scan, no perforation.  Reports he was taking antibiotics and has been feeling better over the last 2 days however yesterday he reports he ate significant amounts of fast food and had a beer and developed worsening pain in his lower abdomen at the same site where his pain had been before.  Denies fevers.  Reports the pain is worse with any "jostling ".  Reports compliance with medications.   Past Medical History:  Diagnosis Date  . Allergy   . Depression   . Diverticulitis   . Fainting spell    in middle school    Patient Active Problem List   Diagnosis Date Noted  . Anxiety and depression 07/28/2017  . Other insomnia 07/28/2017  . Smoker 08/04/2015  . Obesity 08/04/2015  . Allergic rhinitis due to pollen 08/04/2015    History reviewed. No pertinent surgical history.  Prior to Admission medications   Medication Sig Start Date End Date Taking? Authorizing Provider  amoxicillin-clavulanate (AUGMENTIN) 400-57 MG/5ML suspension Take 11 mLs by mouth 3 (three) times daily for 10 days. 07/18/18 07/28/18  Hinda Kehr, MD  escitalopram (LEXAPRO) 10 MG tablet Take 1/2 tablet (5 mg total) once daily for 7 days.  Then, take 1 tablet (10 mg total) once daily and continue. 07/19/17   Mikey College, NP  fluticasone (FLONASE) 50 MCG/ACT nasal spray Place 2 sprays into both nostrils daily. Patient not taking: Reported on 07/19/2017 05/21/17 05/21/18  Laban Emperor, PA-C  ondansetron (ZOFRAN ODT) 4 MG disintegrating tablet Allow 1-2 tablets to dissolve in your mouth every 8  hours as needed for nausea/vomiting 07/18/18   Hinda Kehr, MD  promethazine (PHENERGAN) 12.5 MG tablet Take 1 tablet (12.5 mg total) by mouth every 8 (eight) hours as needed for nausea or vomiting. Patient not taking: Reported on 07/19/2017 10/21/16   Trinna Post, PA-C     Allergies Patient has no known allergies.  Family History  Problem Relation Age of Onset  . Heart disease Maternal Grandmother   . Diabetes Maternal Grandmother   . Cancer Maternal Grandmother        breast cancer  . BRCA 1/2 Maternal Grandmother   . Cancer Paternal Grandfather   . Healthy Sister   . BRCA 1/2 Maternal Aunt   . Depression Mother     Social History Social History   Tobacco Use  . Smoking status: Current Some Day Smoker    Packs/day: 0.25    Types: Cigarettes  . Smokeless tobacco: Never Used  . Tobacco comment: pt smoke pack a week  Substance Use Topics  . Alcohol use: No    Alcohol/week: 0.0 standard drinks  . Drug use: No    Review of Systems  Constitutional: No fever/chills Eyes: No visual changes.  ENT: No sore throat. Cardiovascular: Denies chest pain. Respiratory: Denies shortness of breath. Gastrointestinal: As above Genitourinary: Negative for dysuria.  No frequency Musculoskeletal: Negative for back pain. Skin: Negative for rash. Neurological: Negative for headaches    ____________________________________________   PHYSICAL EXAM:  VITAL SIGNS: ED Triage Vitals  Enc Vitals Group  BP 07/21/18 1347 (!) 132/101     Pulse Rate 07/21/18 1347 60     Resp 07/21/18 1347 18     Temp 07/21/18 1347 98.4 F (36.9 C)     Temp Source 07/21/18 1347 Oral     SpO2 07/21/18 1347 97 %     Weight 07/21/18 1348 117.9 kg (260 lb)     Height 07/21/18 1348 1.829 m (6')     Head Circumference --      Peak Flow --      Pain Score 07/21/18 1347 6     Pain Loc --      Pain Edu? --      Excl. in [place]? --     Constitutional: Alert and oriented.  Eyes: Conjunctivae are  normal.   Nose: No congestion/rhinnorhea. Mouth/Throat: Mucous membranes are moist.    Cardiovascular: Normal rate, regular rhythm. Grossly normal heart sounds.  Good peripheral circulation. Respiratory: Normal respiratory effort.  No retractions.  Gastrointestinal: Significant tenderness in the lower abdomen, centrally and right lower abdomen Genitourinary: deferred Musculoskeletal:  Warm and well perfused Neurologic:  Normal speech and language. No gross focal neurologic deficits are appreciated.  Skin:  Skin is warm, dry and intact. No rash noted. Psychiatric: Mood and affect are normal. Speech and behavior are normal.  ____________________________________________   LABS (all labs ordered are listed, but only abnormal results are displayed)  Labs Reviewed  CBC - Abnormal; Notable for the following components:      Result Value   WBC 13.8 (*)    All other components within normal limits  URINALYSIS, COMPLETE (UACMP) WITH MICROSCOPIC - Abnormal; Notable for the following components:   Color, Urine YELLOW (*)    APPearance CLEAR (*)    Specific Gravity, Urine >1.046 (*)    All other components within normal limits  LIPASE, BLOOD  COMPREHENSIVE METABOLIC PANEL   ____________________________________________  EKG  None ____________________________________________  RADIOLOGY  Contacted by radiologist and informed of perforated diverticulum with free air ____________________________________________   PROCEDURES  Procedure(s) performed: No  Procedures   Critical Care performed: yes  CRITICAL CARE Performed by: Lavonia Drafts   Total critical care time:30 minutes  Critical care time was exclusive of separately billable procedures and treating other patients.  Critical care was necessary to treat or prevent imminent or life-threatening deterioration.  Critical care was time spent personally by me on the following activities: development of treatment plan with  patient and/or surrogate as well as nursing, discussions with consultants, evaluation of patient's response to treatment, examination of patient, obtaining history from patient or surrogate, ordering and performing treatments and interventions, ordering and review of laboratory studies, ordering and review of radiographic studies, pulse oximetry and re-evaluation of patient's condition.  ____________________________________________   INITIAL IMPRESSION / ASSESSMENT AND PLAN / ED COURSE  Pertinent labs & imaging results that were available during my care of the patient were reviewed by me and considered in my medical decision making (see chart for details).  Patient presents with worsening abdominal pain concerning for worsening diverticulitis.  Patient has a history of perforated diverticulitis in the past.  CT scan done 3 days ago showed uncomplicated diverticulitis however given worsening pain we may need to reimage.  We will give IV morphine, IV Zofran  Contacted by Dr. Weber Cooks of radiology and notified of perforated diverticulum with free air.  IV Zosyn ordered  Spoke with Dr. Celine Ahr of general surgery who will come in to see the patient and admit  the patient    ____________________________________________   FINAL CLINICAL IMPRESSION(S) / ED DIAGNOSES  Final diagnoses:  Perforated diverticulum of large intestine        Note:  This document was prepared using Dragon voice recognition software and may include unintentional dictation errors.    Lavonia Drafts, MD 07/21/18 1725

## 2018-07-21 NOTE — ED Triage Notes (Signed)
Pt states several months ago dx with diverticulitis at [facility]. Pt states was seen here Tuesday night for same but pain has not relieved. Pt states sharp pain into his abdomen at this time. Pt denies N/V/D at this time.

## 2018-07-22 ENCOUNTER — Inpatient Hospital Stay: Payer: Self-pay | Admitting: Anesthesiology

## 2018-07-22 ENCOUNTER — Encounter
Admission: EM | Disposition: A | Discharge status: Home Health | Payer: Self-pay | Source: Home / Self Care | Attending: General Surgery

## 2018-07-22 ENCOUNTER — Encounter: Payer: Self-pay | Admitting: General Surgery

## 2018-07-22 DIAGNOSIS — K5720 Diverticulitis of large intestine with perforation and abscess without bleeding: Principal | ICD-10-CM

## 2018-07-22 HISTORY — PX: COLECTOMY WITH COLOSTOMY CREATION/HARTMANN PROCEDURE: SHX6598

## 2018-07-22 LAB — CBC
HEMATOCRIT: 43.6 % (ref 39.0–52.0)
Hemoglobin: 14.7 g/dL (ref 13.0–17.0)
MCH: 30.2 pg (ref 26.0–34.0)
MCHC: 33.7 g/dL (ref 30.0–36.0)
MCV: 89.5 fL (ref 80.0–100.0)
NRBC: 0 % (ref 0.0–0.2)
Platelets: 243 10*3/uL (ref 150–400)
RBC: 4.87 MIL/uL (ref 4.22–5.81)
RDW: 12.1 % (ref 11.5–15.5)
WBC: 23.2 10*3/uL — AB (ref 4.0–10.5)

## 2018-07-22 LAB — URINALYSIS, ROUTINE W REFLEX MICROSCOPIC
Bacteria, UA: NONE SEEN
Bilirubin Urine: NEGATIVE
Glucose, UA: NEGATIVE mg/dL
KETONES UR: 5 mg/dL — AB
Leukocytes, UA: NEGATIVE
Nitrite: NEGATIVE
PH: 5 (ref 5.0–8.0)
PROTEIN: 30 mg/dL — AB
Specific Gravity, Urine: 1.034 — ABNORMAL HIGH (ref 1.005–1.030)

## 2018-07-22 LAB — BASIC METABOLIC PANEL
Anion gap: 9 (ref 5–15)
BUN: 8 mg/dL (ref 6–20)
CALCIUM: 8.7 mg/dL — AB (ref 8.9–10.3)
CO2: 27 mmol/L (ref 22–32)
CREATININE: 1.04 mg/dL (ref 0.61–1.24)
Chloride: 100 mmol/L (ref 98–111)
GFR calc Af Amer: >60 mL/min (ref >60)
GFR calc non Af Amer: >60 mL/min (ref >60)
Glucose, Bld: 105 mg/dL — ABNORMAL HIGH (ref 70–99)
Potassium: 3.6 mmol/L (ref 3.5–5.1)
SODIUM: 136 mmol/L (ref 135–145)

## 2018-07-22 LAB — MRSA PCR SCREENING: MRSA BY PCR: NEGATIVE

## 2018-07-22 LAB — MAGNESIUM: Magnesium: 1.8 mg/dL (ref 1.7–2.4)

## 2018-07-22 LAB — PHOSPHORUS: PHOSPHORUS: 4.3 mg/dL (ref 2.5–4.6)

## 2018-07-22 SURGERY — COLECTOMY, WITH COLOSTOMY CREATION
Anesthesia: General | Site: Abdomen

## 2018-07-22 MED ORDER — PROPOFOL 10 MG/ML IV BOLUS
INTRAVENOUS | Status: DC | PRN
  Administered 2018-07-22: 200 mg via INTRAVENOUS
  Administered 2018-07-22 (×2): 50 mg via INTRAVENOUS

## 2018-07-22 MED ORDER — ROCURONIUM BROMIDE 50 MG/5ML IV SOLN
INTRAVENOUS | Status: AC
  Filled 2018-07-22: qty 1

## 2018-07-22 MED ORDER — GLYCOPYRROLATE 0.2 MG/ML IJ SOLN
INTRAMUSCULAR | Status: DC | PRN
  Administered 2018-07-22: 0.2 mg via INTRAVENOUS

## 2018-07-22 MED ORDER — SUGAMMADEX SODIUM 200 MG/2ML IV SOLN
INTRAVENOUS | Status: AC
  Filled 2018-07-22: qty 2

## 2018-07-22 MED ORDER — FENTANYL CITRATE (PF) 100 MCG/2ML IJ SOLN: 25.0000 ug | INTRAMUSCULAR | Status: DC | PRN

## 2018-07-22 MED ORDER — MIDAZOLAM HCL 2 MG/2ML IJ SOLN
INTRAMUSCULAR | Status: AC
  Filled 2018-07-22: qty 2

## 2018-07-22 MED ORDER — ONDANSETRON HCL 4 MG/2ML IJ SOLN: 4.0000 mg | Freq: Once | INTRAMUSCULAR | Status: DC | PRN

## 2018-07-22 MED ORDER — HYDROMORPHONE HCL 1 MG/ML IJ SOLN
INTRAMUSCULAR | Status: AC
  Filled 2018-07-22: qty 1

## 2018-07-22 MED ORDER — GLYCOPYRROLATE 0.2 MG/ML IJ SOLN
INTRAMUSCULAR | Status: AC
  Filled 2018-07-22: qty 1

## 2018-07-22 MED ORDER — ONDANSETRON HCL 4 MG/2ML IJ SOLN
INTRAMUSCULAR | Status: AC
  Filled 2018-07-22: qty 2

## 2018-07-22 MED ORDER — MIDAZOLAM HCL 2 MG/2ML IJ SOLN
INTRAMUSCULAR | Status: DC | PRN
  Administered 2018-07-22: 2 mg via INTRAVENOUS

## 2018-07-22 MED ORDER — KETOROLAC TROMETHAMINE 30 MG/ML IJ SOLN
30.0000 mg | Freq: Four times a day (QID) | INTRAMUSCULAR | Status: DC | PRN
  Administered 2018-07-22 (×2): 30 mg via INTRAVENOUS
  Filled 2018-07-22 (×2): qty 1

## 2018-07-22 MED ORDER — LIDOCAINE HCL (CARDIAC) PF 100 MG/5ML IV SOSY
PREFILLED_SYRINGE | INTRAVENOUS | Status: DC | PRN
  Administered 2018-07-22: 100 mg via INTRAVENOUS

## 2018-07-22 MED ORDER — FENTANYL CITRATE (PF) 100 MCG/2ML IJ SOLN
INTRAMUSCULAR | Status: AC
  Filled 2018-07-22: qty 2

## 2018-07-22 MED ORDER — PROPOFOL 10 MG/ML IV BOLUS
INTRAVENOUS | Status: AC
  Filled 2018-07-22: qty 20

## 2018-07-22 MED ORDER — SUGAMMADEX SODIUM 500 MG/5ML IV SOLN
INTRAVENOUS | Status: AC
  Filled 2018-07-22: qty 5

## 2018-07-22 MED ORDER — ROCURONIUM BROMIDE 100 MG/10ML IV SOLN
INTRAVENOUS | Status: DC | PRN
  Administered 2018-07-22: 40 mg via INTRAVENOUS
  Administered 2018-07-22 (×2): 20 mg via INTRAVENOUS
  Administered 2018-07-22: 10 mg via INTRAVENOUS
  Administered 2018-07-22: 30 mg via INTRAVENOUS
  Administered 2018-07-22 (×2): 10 mg via INTRAVENOUS
  Administered 2018-07-22: 30 mg via INTRAVENOUS

## 2018-07-22 MED ORDER — DEXAMETHASONE SODIUM PHOSPHATE 10 MG/ML IJ SOLN
INTRAMUSCULAR | Status: AC
  Filled 2018-07-22: qty 1

## 2018-07-22 MED ORDER — SEVOFLURANE IN SOLN
RESPIRATORY_TRACT | Status: AC
  Filled 2018-07-22: qty 250

## 2018-07-22 MED ORDER — EPHEDRINE SULFATE 50 MG/ML IJ SOLN
INTRAMUSCULAR | Status: DC | PRN
  Administered 2018-07-22: 10 mg via INTRAVENOUS

## 2018-07-22 MED ORDER — ACETAMINOPHEN 10 MG/ML IV SOLN
INTRAVENOUS | Status: AC
  Filled 2018-07-22: qty 100

## 2018-07-22 MED ORDER — SUCCINYLCHOLINE CHLORIDE 20 MG/ML IJ SOLN
INTRAMUSCULAR | Status: DC | PRN
  Administered 2018-07-22: 120 mg via INTRAVENOUS

## 2018-07-22 MED ORDER — ACETAMINOPHEN 10 MG/ML IV SOLN
INTRAVENOUS | Status: DC | PRN
  Administered 2018-07-22: 1000 mg via INTRAVENOUS

## 2018-07-22 MED ORDER — FENTANYL CITRATE (PF) 100 MCG/2ML IJ SOLN
INTRAMUSCULAR | Status: DC | PRN
  Administered 2018-07-22 (×7): 50 ug via INTRAVENOUS

## 2018-07-22 MED ORDER — DEXMEDETOMIDINE HCL 200 MCG/2ML IV SOLN
INTRAVENOUS | Status: DC | PRN
  Administered 2018-07-22 (×5): 8 ug via INTRAVENOUS

## 2018-07-22 MED ORDER — SUGAMMADEX SODIUM 200 MG/2ML IV SOLN
INTRAVENOUS | Status: DC | PRN
  Administered 2018-07-22: 300 mg via INTRAVENOUS

## 2018-07-22 MED ORDER — FENTANYL CITRATE (PF) 250 MCG/5ML IJ SOLN
INTRAMUSCULAR | Status: AC
  Filled 2018-07-22: qty 5

## 2018-07-22 MED ORDER — ONDANSETRON HCL 4 MG/2ML IJ SOLN
INTRAMUSCULAR | Status: DC | PRN
  Administered 2018-07-22: 4 mg via INTRAVENOUS

## 2018-07-22 MED ORDER — LIDOCAINE HCL (PF) 2 % IJ SOLN
INTRAMUSCULAR | Status: AC
  Filled 2018-07-22: qty 10

## 2018-07-22 MED ORDER — DEXAMETHASONE SODIUM PHOSPHATE 10 MG/ML IJ SOLN
INTRAMUSCULAR | Status: DC | PRN
  Administered 2018-07-22: 10 mg via INTRAVENOUS

## 2018-07-22 MED ORDER — SUCCINYLCHOLINE CHLORIDE 20 MG/ML IJ SOLN
INTRAMUSCULAR | Status: AC
  Filled 2018-07-22: qty 1

## 2018-07-22 MED ORDER — LACTATED RINGERS IV SOLN
INTRAVENOUS | Status: DC | PRN
  Administered 2018-07-22 (×3): via INTRAVENOUS

## 2018-07-22 SURGICAL SUPPLY — 51 items
BULB RESERV EVAC DRAIN JP 100C (MISCELLANEOUS) ×2 IMPLANT
CANISTER SUCT 1200ML W/VALVE (MISCELLANEOUS) ×2 IMPLANT
COVER WAND RF STERILE (DRAPES) ×2 IMPLANT
DRAIN CHANNEL JP 19F (MISCELLANEOUS) ×2 IMPLANT
DRAPE LAPAROTOMY 100X77 ABD (DRAPES) ×2 IMPLANT
DRSG OPSITE POSTOP 4X12 (GAUZE/BANDAGES/DRESSINGS) ×2 IMPLANT
ELECT BLADE 6.5 EXT (BLADE) ×4 IMPLANT
ELECT CAUTERY BLADE 6.4 (BLADE) ×2 IMPLANT
ELECT REM PT RETURN 9FT ADLT (ELECTROSURGICAL) ×2
ELECTRODE REM PT RTRN 9FT ADLT (ELECTROSURGICAL) ×1 IMPLANT
GLOVE BIO SURGEON STRL SZ7.5 (GLOVE) ×2 IMPLANT
GLOVE INDICATOR 8.0 STRL GRN (GLOVE) ×2 IMPLANT
GOWN STRL REUS W/ TWL LRG LVL3 (GOWN DISPOSABLE) ×2 IMPLANT
GOWN STRL REUS W/TWL LRG LVL3 (GOWN DISPOSABLE) ×2
KIT OSTOMY DRAINABLE 3.25 STR (WOUND CARE) ×2 IMPLANT
KIT TURNOVER KIT A (KITS) ×2 IMPLANT
LABEL OR SOLS (LABEL) ×2 IMPLANT
LIGASURE IMPACT 36 18CM CVD LR (INSTRUMENTS) ×2 IMPLANT
NS IRRIG 1000ML POUR BTL (IV SOLUTION) ×10 IMPLANT
NS IRRIG 500ML POUR BTL (IV SOLUTION) ×2 IMPLANT
PACK BASIN MAJOR ARMC (MISCELLANEOUS) ×2 IMPLANT
PENCIL ELECTRO HAND CTR (MISCELLANEOUS) ×2 IMPLANT
RELOAD PROXIMATE 55MM GREEN (ENDOMECHANICALS) ×2 IMPLANT
SET YANKAUER POOLE SUCT (MISCELLANEOUS) ×2 IMPLANT
SPONGE DRAIN TRACH 4X4 STRL 2S (GAUZE/BANDAGES/DRESSINGS) ×2 IMPLANT
SPONGE LAP 18X18 RF (DISPOSABLE) ×4 IMPLANT
STAPLER CUT CVD 40MM GREEN (STAPLE) ×2 IMPLANT
STAPLER CUT RELOAD GREEN (STAPLE) ×2 IMPLANT
STAPLER PROXIMATE 55 BLUE (STAPLE) ×2 IMPLANT
STAPLER SKIN PROX 35W (STAPLE) ×2 IMPLANT
SUT ETHILON 3-0 FS-10 30 BLK (SUTURE) ×2
SUT PDS AB 1 TP1 96 (SUTURE) ×4 IMPLANT
SUT PROLENE 0 CT 1 30 (SUTURE) ×2 IMPLANT
SUT PROLENE 2 0 SH DA (SUTURE) ×4 IMPLANT
SUT SILK 2 0 (SUTURE) ×3
SUT SILK 2 0SH CR/8 30 (SUTURE) ×2 IMPLANT
SUT SILK 2-0 18XBRD TIE 12 (SUTURE) ×1 IMPLANT
SUT SILK 2-0 30XBRD TIE 12 (SUTURE) ×2 IMPLANT
SUT SILK 3-0 (SUTURE) ×2 IMPLANT
SUT VIC AB 2-0 BRD 54 (SUTURE) ×2 IMPLANT
SUT VIC AB 2-0 CT1 27 (SUTURE) ×1
SUT VIC AB 2-0 CT1 TAPERPNT 27 (SUTURE) ×1 IMPLANT
SUT VIC AB 3-0 54X BRD REEL (SUTURE) ×1 IMPLANT
SUT VIC AB 3-0 BRD 54 (SUTURE) ×1
SUT VIC AB 3-0 SH 27 (SUTURE) ×2
SUT VIC AB 3-0 SH 27X BRD (SUTURE) ×2 IMPLANT
SUT VICRYL 3-0 CR8 SH (SUTURE) ×2 IMPLANT
SUTURE EHLN 3-0 FS-10 30 BLK (SUTURE) ×1 IMPLANT
SYR BULB 3OZ (MISCELLANEOUS) ×2 IMPLANT
SYR BULB IRRIG 60ML STRL (SYRINGE) ×2 IMPLANT
TRAY FOLEY MTR SLVR 16FR STAT (SET/KITS/TRAYS/PACK) ×2 IMPLANT

## 2018-07-22 NOTE — Anesthesia Postprocedure Evaluation (Signed)
Anesthesia Post Note  Patient: Timothy [name]  Procedure(s) Performed: COLECTOMY WITH COLOSTOMY CREATION/HARTMANN PROCEDURE (N/A Abdomen)  Patient location during evaluation: PACU Anesthesia Type: General Level of consciousness: awake and alert and oriented Pain management: pain level controlled Vital Signs Assessment: post-procedure vital signs reviewed and stable Respiratory status: spontaneous breathing Cardiovascular status: blood pressure returned to baseline Anesthetic complications: no     Last Vitals:  Vitals:   07/22/18 1550 07/22/18 1633  BP: 132/81 119/78  Pulse: 71 86  Resp: 15   Temp: 36.8 C 36.4 C  SpO2: 93% 96%    Last Pain:  Vitals:   07/22/18 1633  TempSrc: Oral  PainSc:                  [name]

## 2018-07-22 NOTE — Transfer of Care (Signed)
Immediate Anesthesia Transfer of Care Note  Patient: Timothy [name]  Procedure(s) Performed: COLECTOMY WITH COLOSTOMY CREATION/HARTMANN PROCEDURE (N/A Abdomen)  Patient Location: PACU  Anesthesia Type:General  Level of Consciousness: sedated  Airway & Oxygen Therapy: Patient Spontanous Breathing and Patient connected to face mask oxygen  Post-op Assessment: Report given to RN and Post -op Vital signs reviewed and stable  Post vital signs: Reviewed and stable  Last Vitals:  Vitals Value Taken Time  BP 119/66 07/22/2018  2:46 PM  Temp 36.7 C 07/22/2018  2:46 PM  Pulse 88 07/22/2018  2:49 PM  Resp 19 07/22/2018  2:49 PM  SpO2 99 % 07/22/2018  2:49 PM  Vitals shown include unvalidated device data.  Last Pain:  Vitals:   07/22/18 0720  TempSrc:   PainSc: 0-No pain         Complications: No apparent anesthesia complications

## 2018-07-22 NOTE — Op Note (Signed)
Operative Note  Preoperative Diagnosis:  Perforated diverticulitis  Postoperative Diagnosis:  same  Operation: Exploratory laparotomy, sigmoid colectomy, end colostomy Jeanette Caprice Procedure)  Surgeon: Fredirick Maudlin, MD  Assistant: none  Anesthesia: GETA  Findings: seropurulent drainage in the pelvis, sigmoid colon with inflammation and surrounding phlegmon  Indications: perforated diverticulitis with free air  Procedure In Detail: The patient was identified in the preoperative holding area.  Once again we confirmed that this was Timothy [name] with perforated diverticulitis and discussed the plan of care which included an exploratory laparotomy sigmoid colectomy and end colostomy with Hartman's pouch.  His mother was present in the 2 of them had the opportunity to ask and have all of their questions answered.  We reiterated the risks of the operation.  They both agreed to proceed.  The patient was then brought to the operating room and placed supine on the OR table.  Bony prominences were padded and bilateral sequential compression devices were placed on the lower extremities.  General endotracheal anesthesia was induced without incident.  The patient was positioned appropriately for the operation.  A Foley catheter was aseptically placed by the nursing staff with return of cloudy yellow-orange urine.  He was then sterilely prepped and prepped and draped in standard fashion.  A timeout was performed confirming the patient's identity the procedure being performed his allergies all necessary equipment was available and that a maintenance anesthetic agent was used.  He was on Zosyn from the time of his admission so no additional antibiotics were indicated.  A midline incision was made from just above the umbilicus to just above the pubis.  This was carried down through the subcutaneous tissues using electrocautery.  The fascia was identified and opened in the midline.  We then identified the  peritoneum and opened sharply.  A finger sweep underneath the abdominal wall surface revealed no adhesions in the incision was completely opened.  The abdomen was explored with far less contamination than I had expected.  There was seropurulent fluid in the pelvis but no frank fecal contamination.  The small intestine was evaluated from ligament of Treitz to ileocecal valve and no abnormalities were appreciated.  We inspected the remainder of the colon just below the splenic flexure and down to the pelvis and sigmoid.  The sigmoid colon was markedly inflamed with surrounding phlegmon.  The descending colon appeared to be pretty normal.   I incised the white line of Toldt and mobilized the sigmoid colon as much as I could.  There was again significant phlegmon and would be inflamed tissue which made this somewhat difficult.  As I rotated the colon medially off of the pelvic sidewall I identified the gonadal vessels and ureter which were left intact.  I was finally able to elevate the sigmoid colon up on its mesenteric pedicle.  Using Kelly clamps and silk ties the vascular pedicle was ligated.  I identified an area of normal colon proximal to the sigmoid and cleared it of surrounding fat.  A linear GIA stapler was used with a green load to divide the colon at this location.  A stitch was used to hold it to the abdominal wall for use as our future end colostomy.  I then continued the dissection down into the pelvis.  There was a fair amount of raw surface oozing but no frank bleeding was encountered.  As I came across the base of the sigmoid mesentery there was some moderate bleeding from a vessel that was not adequately tied  and this was sutured ligated with a 3-0 silk.  Was then able to most mobilize the colon down to nearly the level of the sacral promontory however it was still quite fixed and inflamed from slightly above the sacral promontory to the beginning of the rectum with additional phlegmonous tissue.   Initially I was not sure that I would be able to the dissect further down into the pelvis safely and so I divided the sigmoid with a TA stapler.  I removed the specimen and handed off however once the bulk of the colon was free I saw that I was able to further dissect into the pelvis.  The remainder of the diseased colon was dissected off of the surrounding fat and a second load of the TA stapler was used to divide the distal sigmoid colon just at the sacral promontory.  A Prolene suture was used to mark the Hartman's pouch.  This was tacked to the abdominal wall initially however the sutures pulled through.  A second attempt was made using this same suture and it was sewn to the sacral promontory.  At this time it stayed in place.  We then irrigated the abdomen copiously with several liters of warm saline.  I then further mobilized the splenic flexure to bring enough: [place] to the left lower quadrant.  Before beginning the ostomy placement I placed a #19 Blake drain into the pelvic cul-de-sac and brought it out through the right lower quadrant.  It was sutured in place with a 3-0 nylon stitch.  Began the process of creating the ostomy.  A site in the left lower quadrant was identified and the skin was incised in a circular fashion.  The overlying subcutaneous fat was coned out using electrocautery.  The fascia was identified and incised in a cruciate fashion.  This incision was stretched to admit 3 fingers..  We then brought the mobilized colon up through this site.  Additional division of some retroperitoneal attachments were required to have enough length and to eliminate tension.  Then placed a Glassman clamp across the stapled off end of the descending colon and excised the staple line.  Babcock clamps were used to open the colon and minimize any spillage.  The colon was sutured to the fascia with several interrupted 3-0 silk sutures.  The ostomy was then matured using dissolvable Vicryl sutures.  Despite  several attempts I was not able to get good protrusion of the ostomy.  It is currently flush and flat with the skin.  The midline fascia was then closed with #1 looped PDS and the skin reapproximated with staples after irrigating subcutaneum.  A honeycomb dressing was applied.  A stoma appliance was then placed over the ostomy and a drain sponge was placed around the stab incision for the [facility] drain.  Bulb suction was attached.  The patient was awakened extubated and taken to the postanesthesia care unit in stable condition.    EBL: 800  IVF: 2600 cc crystalloid  Specimen(s): sigmoid colon  Complications: none immediately apparent.   Counts: all needles, instruments, and sponges were counted and reported to be correct in number at the end of the case.   I was present for and participated in the entire operation.  Fredirick Maudlin 07/22/18  2:50 PM

## 2018-07-22 NOTE — Progress Notes (Signed)
15 minute call to floor. 

## 2018-07-22 NOTE — Anesthesia Procedure Notes (Signed)
Procedure Name: Intubation Date/Time: 07/22/2018 9:55 AM Performed by: Aline Brochure, CRNA Pre-anesthesia Checklist: Patient identified, Emergency Drugs available, Suction available and Patient being monitored Patient Re-evaluated:Patient Re-evaluated prior to induction Oxygen Delivery Method: Circle system utilized Preoxygenation: Pre-oxygenation with 100% oxygen Induction Type: IV induction and Cricoid Pressure applied Laryngoscope Size: Mac and 4 Grade View: Grade I Tube type: Oral Tube size: 7.5 mm Number of attempts: 1 Airway Equipment and Method: Stylet and LTA kit utilized Placement Confirmation: ETT inserted through vocal cords under direct vision,  positive ETCO2 and breath sounds checked- equal and bilateral Secured at: 22.5 cm Tube secured with: Tape Dental Injury: Teeth and Oropharynx as per pre-operative assessment

## 2018-07-22 NOTE — Progress Notes (Signed)
RN paged surgeon; Dr. Fredirick Maudlin, regarding patient's continued unrelieved pain with administered of pain medications every two hours as ordered.  Dr. Celine Ahr informed patient per her note of the severity of probable pain in ED,  "He was also cautioned that if he worsens overnight, we may need to proceed emergently to the OR." Dr. Celine Ahr added Toradol to current medications for pain relief.  RN will update as needed.

## 2018-07-22 NOTE — Progress Notes (Signed)
Patient startled and woke , removed oral airway. Reassured patient .

## 2018-07-22 NOTE — Anesthesia Post-op Follow-up Note (Signed)
Anesthesia QCDR form completed.        

## 2018-07-23 LAB — PHOSPHORUS: PHOSPHORUS: 2.8 mg/dL (ref 2.5–4.6)

## 2018-07-23 LAB — MAGNESIUM: Magnesium: 2 mg/dL (ref 1.7–2.4)

## 2018-07-23 LAB — BASIC METABOLIC PANEL
Anion gap: 8 (ref 5–15)
BUN: 8 mg/dL (ref 6–20)
CALCIUM: 8 mg/dL — AB (ref 8.9–10.3)
CO2: 27 mmol/L (ref 22–32)
CREATININE: 0.72 mg/dL (ref 0.61–1.24)
Chloride: 102 mmol/L (ref 98–111)
Glucose, Bld: 141 mg/dL — ABNORMAL HIGH (ref 70–99)
Potassium: 3.9 mmol/L (ref 3.5–5.1)
SODIUM: 137 mmol/L (ref 135–145)

## 2018-07-23 LAB — CBC
HCT: 36.8 % — ABNORMAL LOW (ref 39.0–52.0)
Hemoglobin: 12.5 g/dL — ABNORMAL LOW (ref 13.0–17.0)
MCH: 30.3 pg (ref 26.0–34.0)
MCHC: 34 g/dL (ref 30.0–36.0)
MCV: 89.3 fL (ref 80.0–100.0)
PLATELETS: 208 10*3/uL (ref 150–400)
RBC: 4.12 MIL/uL — ABNORMAL LOW (ref 4.22–5.81)
RDW: 12.3 % (ref 11.5–15.5)
WBC: 26.3 10*3/uL — AB (ref 4.0–10.5)
nRBC: 0 % (ref 0.0–0.2)

## 2018-07-23 LAB — HIV ANTIBODY (ROUTINE TESTING W REFLEX): HIV SCREEN 4TH GENERATION: NONREACTIVE

## 2018-07-23 MED ORDER — NALOXONE HCL 0.4 MG/ML IJ SOLN: 0.4000 mg | INTRAMUSCULAR | Status: DC | PRN

## 2018-07-23 MED ORDER — ONDANSETRON HCL 4 MG/2ML IJ SOLN: 4.0000 mg | Freq: Four times a day (QID) | INTRAMUSCULAR | Status: DC | PRN

## 2018-07-23 MED ORDER — HYDROMORPHONE 1 MG/ML IV SOLN
INTRAVENOUS | Status: DC
  Administered 2018-07-23: 25 mg via INTRAVENOUS
  Administered 2018-07-24: 1.8 mg via INTRAVENOUS
  Administered 2018-07-25: 1.2 mg via INTRAVENOUS
  Administered 2018-07-25: 0.9 mg via INTRAVENOUS

## 2018-07-23 MED ORDER — DIPHENHYDRAMINE HCL 50 MG/ML IJ SOLN: 12.5000 mg | Freq: Four times a day (QID) | INTRAMUSCULAR | Status: DC | PRN

## 2018-07-23 MED ORDER — KETOROLAC TROMETHAMINE 30 MG/ML IJ SOLN
30.0000 mg | Freq: Four times a day (QID) | INTRAMUSCULAR | Status: AC
  Administered 2018-07-23 – 2018-07-26 (×11): 30 mg via INTRAVENOUS
  Filled 2018-07-23 (×11): qty 1

## 2018-07-23 MED ORDER — SODIUM CHLORIDE 0.9% FLUSH: 9.0000 mL | INTRAVENOUS | Status: DC | PRN

## 2018-07-23 MED ORDER — DIPHENHYDRAMINE HCL 12.5 MG/5ML PO ELIX
12.5000 mg | ORAL_SOLUTION | Freq: Four times a day (QID) | ORAL | Status: DC | PRN
  Filled 2018-07-23: qty 5

## 2018-07-23 NOTE — Care Management (Signed)
RNCM assessment complete.  Full note to follow.  

## 2018-07-23 NOTE — Consult Note (Signed)
[place] Nurse ostomy consult note Stoma type/location: LLQ colostomy Stomal assessment/size: Refuses to change pouch or look at stoma today.  He has just had Toradol. He agrees to pouch change tomorrow.  His mother is a Psychologist, sport and exercise he states and will be his back up but his desire is to change independently. He lives with her.  Provided written materials and answered overall questions about activities with a stoma, showering and frequency of pouch changes.  No stomal output and I encouraged him to ambulate more.  Peristomal assessment: not assessed Treatment options for stomal/peristomal skin: Will switch to 2 1/4" pouch with barrier ring tomorrow.  Output none yet.  Scant blood tinged liquid Ostomy pouching: 2pc.  Has 4" pouch in place from OR.  Will try and use smaller appliance tomorrow.   Education provided: see above Enrolled patient in [place] program: No after pouch change.  Left form in folder in room to sign.  [place] team will follow.  Domenic Moras MSN, RN, FNP-BC CWON Wound, Ostomy, Continence Nurse Pager [phone]

## 2018-07-23 NOTE — Progress Notes (Addendum)
[place] Hospital Day(s): 2.   Post op day(s): 1 Day Post-Op.   Interval History: Patient seen and examined, no acute events or new complaints overnight. Patient reports abdominal soreness worse around his JP drain site, denies fever, chills, nausea, or emesis. Has not noticed output from his colostomy. He has tolerated clear liquids to this point. Has not been mobile due to being scared but with assistance this morning ambulated around the room.  Review of Systems:  Constitutional: denies fever, chills  Respiratory: denies any shortness of breath  Cardiovascular: denies chest pain or palpitations  Gastrointestinal: + abdominal pain, denied N/V, or diarrhea/and bowel function as per interval history Musculoskeletal: denies pain, decreased motor or sensation Integumentary: denies any other rashes or skin discolorations except laparotomy incision   Vital signs in last 24 hours: [min-max] current  Temp:  [97.6 F (36.4 C)-98.2 F (36.8 C)] 98.2 F (36.8 C) (11/25 0506) Pulse Rate:  [71-105] 82 (11/25 0506) Resp:  [15-25] 20 (11/25 0506) BP: (110-140)/(63-93) 140/90 (11/25 0506) SpO2:  [93 %-99 %] 95 % (11/25 0506)     Height: 6' (182.9 cm) Weight: 110.1 kg BMI (Calculated): 32.91   Intake/Output this shift:  Total I/O In: 2177.9 [I.V.:2025.4; IV Piggyback:152.5] Out: 365 [Urine:350; Drains:15]   Intake/Output last 2 shifts:  @IOLAST2SHIFTS @   Physical Exam:  Constitutional: alert, cooperative and no distress  Respiratory: breathing non-labored at rest  Cardiovascular: regular rate and sinus rhythm  Gastrointestinal: obese, soft, peri-incisional tenderness, and non-distended. JP in RLQ with serosanguinous fluid in tubing and bulb. Colostomy in LLQ, ostomy is pink and patent, no gas or stool in bag Integumentary: Midline laparotomy incision is CDI without erythema or drainage   Labs:  CBC Latest Ref Rng & Units 07/23/2018 07/22/2018 07/21/2018  WBC 4.0 -  10.5 K/uL 26.3(H) 23.2(H) 13.8(H)  Hemoglobin 13.0 - 17.0 g/dL 12.5(L) 14.7 15.0  Hematocrit 39.0 - 52.0 % 36.8(L) 43.6 43.2  Platelets 150 - 400 K/uL 208 243 251   CMP Latest Ref Rng & Units 07/23/2018 07/22/2018 07/21/2018  Glucose 70 - 99 mg/dL 141(H) 105(H) 94  BUN 6 - 20 mg/dL 8 8 11   Creatinine 0.61 - 1.24 mg/dL 0.72 1.04 0.77  Sodium 135 - 145 mmol/L 137 136 135  Potassium 3.5 - 5.1 mmol/L 3.9 3.6 3.7  Chloride 98 - 111 mmol/L 102 100 99  CO2 22 - 32 mmol/L 27 27 24   Calcium 8.9 - 10.3 mg/dL 8.0(L) 8.7(L) 8.9  Total Protein 6.5 - 8.1 g/dL - - 7.9  Total Bilirubin 0.3 - 1.2 mg/dL - - 0.9  Alkaline Phos 38 - 126 U/L - - 75  AST 15 - 41 U/L - - 21  ALT 0 - 44 U/L - - 31     Imaging studies: No new pertinent imaging studies   Assessment/Plan: (ICD-10's: K75.21) 22 y.o. male with further elevated leukocytosis most likely a component of infection and reactive from surgery who is otherwise doing well 1 Day Post-Op s/p Hartmann's Procedure for perforated sigmoid diverticulitis, complicated by pertinent comorbidities including obesity and current tobacco abuse (smoking).   - Clear Liquids diet, will advance as tolerate pending return of bowel function   - Continue IVF, wean as diet advances   - Monitor abdominal examination and on-going bowel function  - Pain control (will try PCA), anti-emetics   - Continue IV ABx (Zosyn)  - Continue JP drain, daily dressing changes.    - Monitor daily labs (CBC, BMP)  -  Encouraged mobilization    All of the above findings and recommendations were discussed with the patient, patient's family, and the medical team, and all of patient's and family's questions were answered to their expressed satisfaction.  -- Edison Simon, PA-C [facility] 07/23/2018, 8:46 AM [phone] M-F: 7am - 4pm  I saw and evaluated the patient.  I agree with the above documentation, exam, and plan, which I have edited where  appropriate. Fredirick Maudlin  9:40 AM

## 2018-07-24 LAB — SURGICAL PATHOLOGY

## 2018-07-24 LAB — CBC
HEMATOCRIT: 36.5 % — AB (ref 39.0–52.0)
Hemoglobin: 11.7 g/dL — ABNORMAL LOW (ref 13.0–17.0)
MCH: 30.2 pg (ref 26.0–34.0)
MCHC: 32.1 g/dL (ref 30.0–36.0)
MCV: 94.3 fL (ref 80.0–100.0)
NRBC: 0 % (ref 0.0–0.2)
PLATELETS: 222 10*3/uL (ref 150–400)
RBC: 3.87 MIL/uL — ABNORMAL LOW (ref 4.22–5.81)
RDW: 12.5 % (ref 11.5–15.5)
WBC: 15.2 10*3/uL — AB (ref 4.0–10.5)

## 2018-07-24 LAB — BASIC METABOLIC PANEL
ANION GAP: 4 — AB (ref 5–15)
BUN: 8 mg/dL (ref 6–20)
CALCIUM: 7.9 mg/dL — AB (ref 8.9–10.3)
CO2: 31 mmol/L (ref 22–32)
CREATININE: 0.77 mg/dL (ref 0.61–1.24)
Chloride: 103 mmol/L (ref 98–111)
Glucose, Bld: 113 mg/dL — ABNORMAL HIGH (ref 70–99)
Potassium: 4.1 mmol/L (ref 3.5–5.1)
SODIUM: 138 mmol/L (ref 135–145)

## 2018-07-24 LAB — URINE CULTURE: Culture: NO GROWTH

## 2018-07-24 MED ORDER — SODIUM CHLORIDE 0.9 % IV SOLN
INTRAVENOUS | Status: DC | PRN
  Administered 2018-07-24: 500 mL via INTRAVENOUS

## 2018-07-24 NOTE — Progress Notes (Signed)
Patient continues to pull oxygen sensor from nose and alarms going off frequently throughout the night;strongly encouraged to keep O2 sensor in nose and to use IS. Timothy [name] K 4:55 AM 07/24/2018

## 2018-07-24 NOTE — Care Management (Signed)
RNCM met with patient 07/23/18, along with mother and sister who were at bedside.  Patient lives at home with mother and sister.  Prior to admission patient was independent and employed. PCP Cassell Smiles.  Last seen 06/2017.  Hospital follow up appointment scheduled for 08/06/18.  Pharmacy [facility], however patient is not on any routine medications.   Patient with new ostomy in place. Patient has been seen by [facility] RN while in patient.   Mother would like to proceed with charity home health services through [place] if patient qualifies.  Referral made to [facility] with [place].    Patient seen by Aleene Davidson from Financial counseling.  Mother request contact information to follow up on Medicaid information.  With patient approval will provide mother with contact information.  Mother also request information on CAPS.  Provide information from DSS web site.  RNCM did inform mother that it would be unlikely that patient would be approved for CAPS services for ostomy care alone.

## 2018-07-24 NOTE — Consult Note (Signed)
[place] Nurse ostomy follow up Stoma type/location: LLQ colostomy  First pouch change today.  Sister is at bedside and records this session.  Mother is at work.  They do not wish to wait until afternoon for her to come back.  Patient states he wants to learn this.   Stomal assessment/size: Oval:  Length is 1.3 cm width is 4.2 cm and flush with abdomen.  Creasing at 3 and 9'oclock.  Will use barrier ring Peristomal assessment: intact midline staple line.  Assessed and is intact today.  RLQ JP drain present  Treatment options for stomal/peristomal skin: barrier ring and likely will need 1 piece convex at next pouch change.  Output blood tinged liquid Ostomy pouching: 2pc. 2 3/4" pouch with barrier ring  Education provided: POuch change performed.  Discussed twice weekly pouch changes, emptying when 1/3 full once he begins making stool.  HE does flatus noted this AM.  Encouraged to get up and walk as much as possible. He is uncomfortable with visualizing stoma.  Provided stern emotional support  that he would certainly be able to manage self care and that we should start with looking at the stoma and participating in care.  He agrees and does look at stoma.  Explained flush, oval stoma and the unique pouching interventions we would implement to ensure a secure fit.  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  Patient is able to open and close pouch today and simulate cleaning the pouch after emptying.    Enrolled patient in [place] Start Discharge program: No But will begin today.  Will likely need indigent services.  Woc team will follow.   Domenic Moras MSN, RN, FNP-BC CWON Wound, Ostomy, Continence Nurse Pager [phone]

## 2018-07-24 NOTE — Progress Notes (Signed)
07/23/2018-2000:Mother at bedside anxious regarding pain, JP, Oxygen tubing in front of mouth, unrelieved pain and voiding; acknowledged concerns; educated patient and mother on JP drain care, reason for specific O2 tubing for PCA use; encouraged getting up OOB to help with voiding, if he can't we'll bladder scan him later; also encouraged frequent IS use, instructed on proper usage; voiced reluctance and understanding; 2200-after 2 attempts at voiding, bladder scan with 182 mls or urine noted; encouraged po intake and trying again later to void;  2300-O2 at 2LPM [place] applied for O2 sat 89%, now maintaining > 92%;  07/24/2018- 0100: flatus passing frequently through stoma;  0340-PCA effective with PR 4/10; Mya Suell K 3:54 AM 07/24/2018

## 2018-07-24 NOTE — Progress Notes (Signed)
[facility] Hospital Day(s): 3.   Post op day(s): 2 Days Post-Op.   Interval History: Patient seen and examined, no acute events or new complaints overnight. Patient reports continued abdominal soreness worse with movement making him scared to be active, denies fever, chills, nausea, or emesis. He endorses passing flatus from the bag multiple time since last night and during examination this morning. Has not been mobile outside of his room because he is scared its going to hurt.   Review of Systems:  Constitutional: denies fever, chills  Respiratory: denies any shortness of breath  Cardiovascular: denies chest pain or palpitations  Gastrointestinal: + abdominal pain, denied N/V, or diarrhea/and bowel function as per interval history Musculoskeletal: denies pain, decreased motor or sensation Integumentary: denies any other rashes or skin discolorations except midline laparotomy incision  Vital signs in last 24 hours: [min-max] current  Temp:  [97.7 F (36.5 C)-98.7 F (37.1 C)] 97.7 F (36.5 C) (11/26 0547) Pulse Rate:  [60-79] 60 (11/26 0547) Resp:  [13-26] 19 (11/26 0819) BP: (116-149)/(72-91) 116/73 (11/26 0547) SpO2:  [91 %-100 %] 98 % (11/26 0819)     Height: 6' (182.9 cm) Weight: 110.1 kg BMI (Calculated): 32.91   Intake/Output this shift:  No intake/output data recorded.   Intake/Output last 2 shifts:  @IOLAST2SHIFTS @   Physical Exam:  Constitutional: alert, cooperative and no distress  Respiratory: breathing non-labored at rest  Cardiovascular: regular rate and sinus rhythm  Gastrointestinal: obese, soft, peri-incisional tenderness, and non-distended. JP in RLQ with serous fluid in tubing and bulb. Colostomy in LLQ, ostomy is pink and patent, no gas or stool in bag Integumentary: Midline laparotomy incision is CDI without erythema or drainage   Labs:  CBC Latest Ref Rng & Units 07/24/2018 07/23/2018 07/22/2018  WBC 4.0 - 10.5 K/uL 15.2(H) 26.3(H)  23.2(H)  Hemoglobin 13.0 - 17.0 g/dL 11.7(L) 12.5(L) 14.7  Hematocrit 39.0 - 52.0 % 36.5(L) 36.8(L) 43.6  Platelets 150 - 400 K/uL 222 208 243   CMP Latest Ref Rng & Units 07/24/2018 07/23/2018 07/22/2018  Glucose 70 - 99 mg/dL 113(H) 141(H) 105(H)  BUN 6 - 20 mg/dL 8 8 8   Creatinine 0.61 - 1.24 mg/dL 0.77 0.72 1.04  Sodium 135 - 145 mmol/L 138 137 136  Potassium 3.5 - 5.1 mmol/L 4.1 3.9 3.6  Chloride 98 - 111 mmol/L 103 102 100  CO2 22 - 32 mmol/L 31 27 27   Calcium 8.9 - 10.3 mg/dL 7.9(L) 8.0(L) 8.7(L)  Total Protein 6.5 - 8.1 g/dL - - -  Total Bilirubin 0.3 - 1.2 mg/dL - - -  Alkaline Phos 38 - 126 U/L - - -  AST 15 - 41 U/L - - -  ALT 0 - 44 U/L - - -     Imaging studies: No new pertinent imaging studies   Assessment/Plan: (ICD-10's: K43.21) 22 y.o. male with improved leukocytosis who is otherwise doing well 2 Days Post-Op s/p Hartmann's Procedure for perforated sigmoid diverticulitis, complicated by pertinent comorbidities including obesity and current tobacco abuse (smoking).              - Advance to full liquids + nutritional supplementation             - Continue IVF, wean as diet advances              - Monitor abdominal examination and on-going bowel function  - Wound ostomy team following, appreciate their help             -  Pain control (will try PCA), anti-emetics              - Continue IV ABx (Zosyn)             - Continue JP drain, daily dressing changes.               - Monitor daily labs (CBC)             - Encouraged mobilization  - DVT Prophylaxis    All of the above findings and recommendations were discussed with the patient, patient's family, and the medical team, and all of patient's and family's questions were answered to their expressed satisfaction.  -- Edison Simon, PA-C [facility] 07/24/2018, 8:25 AM [phone] M-F: 7am - 4pm

## 2018-07-25 LAB — BASIC METABOLIC PANEL
ANION GAP: 9 (ref 5–15)
BUN: 9 mg/dL (ref 6–20)
CALCIUM: 8.2 mg/dL — AB (ref 8.9–10.3)
CO2: 28 mmol/L (ref 22–32)
Chloride: 102 mmol/L (ref 98–111)
Creatinine, Ser: 0.68 mg/dL (ref 0.61–1.24)
Glucose, Bld: 101 mg/dL — ABNORMAL HIGH (ref 70–99)
POTASSIUM: 3.8 mmol/L (ref 3.5–5.1)
Sodium: 139 mmol/L (ref 135–145)

## 2018-07-25 LAB — CBC
HCT: 35.3 % — ABNORMAL LOW (ref 39.0–52.0)
Hemoglobin: 11.5 g/dL — ABNORMAL LOW (ref 13.0–17.0)
MCH: 30.2 pg (ref 26.0–34.0)
MCHC: 32.6 g/dL (ref 30.0–36.0)
MCV: 92.7 fL (ref 80.0–100.0)
NRBC: 0 % (ref 0.0–0.2)
PLATELETS: 236 10*3/uL (ref 150–400)
RBC: 3.81 MIL/uL — AB (ref 4.22–5.81)
RDW: 12.4 % (ref 11.5–15.5)
WBC: 10.5 10*3/uL (ref 4.0–10.5)

## 2018-07-25 LAB — PHOSPHORUS: PHOSPHORUS: 4.6 mg/dL (ref 2.5–4.6)

## 2018-07-25 LAB — MAGNESIUM: MAGNESIUM: 1.8 mg/dL (ref 1.7–2.4)

## 2018-07-25 MED ORDER — ACETAMINOPHEN 160 MG/5ML PO SOLN
650.0000 mg | Freq: Four times a day (QID) | ORAL | Status: DC | PRN
  Administered 2018-07-26: 650 mg via ORAL
  Filled 2018-07-25 (×5): qty 20.3

## 2018-07-25 MED ORDER — OXYCODONE HCL 5 MG/5ML PO SOLN
5.0000 mg | ORAL | Status: DC | PRN
  Administered 2018-07-25 – 2018-07-28 (×7): 5 mg via ORAL
  Filled 2018-07-25 (×7): qty 5

## 2018-07-25 MED ORDER — AMOXICILLIN-POT CLAVULANATE 400-57 MG/5ML PO SUSR
875.0000 mg | Freq: Two times a day (BID) | ORAL | Status: DC
  Administered 2018-07-25 – 2018-07-27 (×5): 872 mg via ORAL
  Filled 2018-07-25 (×11): qty 10.9

## 2018-07-25 MED ORDER — HYDROMORPHONE HCL 1 MG/ML IJ SOLN
1.0000 mg | INTRAMUSCULAR | Status: DC | PRN
  Administered 2018-07-25 – 2018-07-26 (×5): 1 mg via INTRAVENOUS
  Filled 2018-07-25 (×5): qty 1

## 2018-07-25 NOTE — Progress Notes (Signed)
PCA waste witnessed by Verdis Prime.  There was 55ml (4mg ) in the syringe but pixis would only allow me to enter 43ml (1 mg)

## 2018-07-25 NOTE — Care Management (Signed)
Per Leroy Sea with [place] patient has been approved with for charity home health nursing services.  Will require home health order at discharge.

## 2018-07-25 NOTE — Progress Notes (Signed)
3 Days Post-Op   Subjective/Chief Complaint: S/p Hartmann's procedure for perforated diverticulitis.  States not using PCA. Tolerated full liquid diet.  Resisting ostomy teaching.   Objective: Vital signs in last 24 hours: Temp:  [98 F (36.7 C)-98.6 F (37 C)] 98.6 F (37 C) (11/27 0854) Pulse Rate:  [56-75] 73 (11/27 0854) Resp:  [13-21] 16 (11/27 0854) BP: (105-131)/(67-82) 131/82 (11/27 0854) SpO2:  [96 %-100 %] 96 % (11/27 0854) Last BM Date: 07/20/18  Intake/Output from previous day: 11/26 0701 - 11/27 0700 In: 2165.7 [P.O.:250; I.V.:1765.7; IV Piggyback:150] Out: 1510 [Urine:1175; Drains:335] Intake/Output this shift: Total I/O In: -  Out: 810 [Urine:800; Drains:10]  General appearance: drowsy, but awakens and responds appropriately GI: soft, appropriately tender to palpation, non-distended Incision/Wound: vertical midline incision intact without erythema, induration, or drainage. Ostomy pink, flush with skin.  No gas or stool in bag this AM.  JP with minimal output, serous.  Lab Results:  Recent Labs    07/24/18 0528 07/25/18 0739  WBC 15.2* 10.5  HGB 11.7* 11.5*  HCT 36.5* 35.3*  PLT 222 236   BMET Recent Labs    07/24/18 0528 07/25/18 0739  NA 138 139  K 4.1 3.8  CL 103 102  CO2 31 28  GLUCOSE 113* 101*  BUN 8 9  CREATININE 0.77 0.68  CALCIUM 7.9* 8.2*   PT/INR No results for input(s): LABPROT, INR in the last 72 hours. ABG No results for input(s): PHART, HCO3 in the last 72 hours.  Invalid input(s): PCO2, PO2  Studies/Results: No results found.  Anti-infectives: Anti-infectives (From admission, onward)   Start     Dose/Rate Route Frequency Ordered Stop   07/25/18 1000  amoxicillin-clavulanate (AUGMENTIN) 400-57 MG/5ML suspension 872 mg     875 mg of amoxicillin Oral Every 12 hours 07/25/18 0958     07/21/18 1900  piperacillin-tazobactam (ZOSYN) IVPB 3.375 g  Status:  Discontinued     3.375 g 12.5 mL/hr over 240 Minutes Intravenous  Every 8 hours 07/21/18 1854 07/25/18 0958   07/21/18 1730  piperacillin-tazobactam (ZOSYN) IVPB 3.375 g     3.375 g 100 mL/hr over 30 Minutes Intravenous  Once 07/21/18 1719 07/21/18 1758      Assessment/Plan: s/p Procedure(s): COLECTOMY WITH COLOSTOMY CREATION/HARTMANN PROCEDURE (N/A) WBC normal, will switch to PO abx for a total of 10 days of therapy  Advance diet to soft. Will d/c PCA and start oral pain medication. Will d/c JP drain. Mobilize. Needs additional ostomy teaching prior to d/c.  LOS: 4 days    Timothy [name] 07/25/2018

## 2018-07-25 NOTE — Consult Note (Addendum)
WOC Nurse ostomy follow up Mother at bedside. NO output in pouch at this time.  Patient is guarding and trying not to cough.  Given pillow on abdomen to press into abdomen and help with pain with coughing.  Encouraged to cough and ambulate to expectorate and reduce risk of pneumonia. He refuses ostomy pouch change and asks mother and I to discuss his care outside of room.  MOther and I stepped outside and discussed steps of his care.  She is a CNA II and feels as if she can manage the steps.  Would like another teaching session when he begins making stool.  Mother and I have teaching session, verbally only.  Patient agrees to get up and walks around unit this AM.  I have left a box of 1 piece convex pouches for next pouch change due to flush stoma.  We have no belts in house and patient states he will not wear it anyway. I am hopeful this will feel more real to him once he begins making stool and will engage in teaching.   [place] team will follow.  Domenic Moras MSN, RN, FNP-BC CWON Wound, Ostomy, Continence Nurse Pager [phone]

## 2018-07-26 LAB — CBC
HCT: 35 % — ABNORMAL LOW (ref 39.0–52.0)
HEMOGLOBIN: 11.6 g/dL — AB (ref 13.0–17.0)
MCH: 30.1 pg (ref 26.0–34.0)
MCHC: 33.1 g/dL (ref 30.0–36.0)
MCV: 90.7 fL (ref 80.0–100.0)
Platelets: 247 10*3/uL (ref 150–400)
RBC: 3.86 MIL/uL — AB (ref 4.22–5.81)
RDW: 12.3 % (ref 11.5–15.5)
WBC: 9.2 10*3/uL (ref 4.0–10.5)
nRBC: 0 % (ref 0.0–0.2)

## 2018-07-26 NOTE — Progress Notes (Signed)
4 Days Post-Op   Subjective/Chief Complaint: S/p Hartmann's procedure for perforated diverticulitis. Tolerating soft diet.  Pain controlled with oral pain medications.  RN reports patient reluctant to mobilize. Patient and mother report "lots" of gas in bag.  Objective: Vital signs in last 24 hours: Temp:  [98.2 F (36.8 C)-98.6 F (37 C)] 98.2 F (36.8 C) (11/28 0620) Pulse Rate:  [62-73] 62 (11/28 0620) Resp:  [16-20] 20 (11/28 0620) BP: (131-142)/(72-88) 142/88 (11/28 0620) SpO2:  [96 %-98 %] 97 % (11/28 0620) Last BM Date: 07/20/18  Intake/Output from previous day: 11/27 0701 - 11/28 0700 In: 807.8 [P.O.:700; I.V.:57.8; IV Piggyback:50] Out: 3335 [Urine:1800; Drains:10] Intake/Output this shift: No intake/output data recorded.  General appearance: alert, more pleasant and conversant today. GI: soft, appropriately tender to palpation, non-distended Incision/Wound: vertical midline incision intact without erythema, induration, or drainage. Ostomy pink, flush with skin.  No gas or stool in bag this AM.    Lab Results:   Recent Labs    07/25/18 0739 07/26/18 0700  WBC 10.5 9.2  HGB 11.5* 11.6*  HCT 35.3* 35.0*  PLT 236 247   BMET Recent Labs    07/24/18 0528 07/25/18 0739  NA 138 139  K 4.1 3.8  CL 103 102  CO2 31 28  GLUCOSE 113* 101*  BUN 8 9  CREATININE 0.77 0.68  CALCIUM 7.9* 8.2*   PT/INR No results for input(s): LABPROT, INR in the last 72 hours. ABG No results for input(s): PHART, HCO3 in the last 72 hours.  Invalid input(s): PCO2, PO2  Studies/Results: No results found.  Anti-infectives: Anti-infectives (From admission, onward)   Start     Dose/Rate Route Frequency Ordered Stop   07/25/18 1800  amoxicillin-clavulanate (AUGMENTIN) 400-57 MG/5ML suspension 872 mg     875 mg of amoxicillin Oral Every 12 hours 07/25/18 0958     07/21/18 1900  piperacillin-tazobactam (ZOSYN) IVPB 3.375 g  Status:  Discontinued     3.375 g 12.5 mL/hr over 240  Minutes Intravenous Every 8 hours 07/21/18 1854 07/25/18 0958   07/21/18 1730  piperacillin-tazobactam (ZOSYN) IVPB 3.375 g     3.375 g 100 mL/hr over 30 Minutes Intravenous  Once 07/21/18 1719 07/21/18 1758      Assessment/Plan: s/p Procedure(s): COLECTOMY WITH COLOSTOMY CREATION/HARTMANN PROCEDURE (N/A) WBC normal, will switch to PO abx for a total of 10 days of therapy  Advance diet to regular. Mobilize. Needs additional ostomy teaching prior to d/c.  LOS: 5 days    Fredirick Maudlin 07/26/2018

## 2018-07-27 MED ORDER — KETOROLAC TROMETHAMINE 30 MG/ML IJ SOLN
30.0000 mg | Freq: Four times a day (QID) | INTRAMUSCULAR | Status: DC
  Administered 2018-07-27 – 2018-07-28 (×3): 30 mg via INTRAVENOUS
  Filled 2018-07-27 (×4): qty 1

## 2018-07-27 MED ORDER — LORAZEPAM 2 MG/ML IJ SOLN
0.5000 mg | Freq: Four times a day (QID) | INTRAMUSCULAR | Status: DC | PRN
  Administered 2018-07-27 (×2): 0.5 mg via INTRAVENOUS
  Filled 2018-07-27 (×2): qty 1

## 2018-07-27 MED ORDER — HYDROMORPHONE HCL 1 MG/ML IJ SOLN: 0.5000 mg | INTRAMUSCULAR | Status: DC | PRN

## 2018-07-27 MED ORDER — POLYETHYLENE GLYCOL 3350 17 G PO PACK
17.0000 g | PACK | Freq: Every day | ORAL | Status: DC
  Administered 2018-07-27: 17 g via ORAL
  Filled 2018-07-27: qty 1

## 2018-07-27 NOTE — Progress Notes (Signed)
07/27/2018  Subjective: Patient is 5 Days Post-Op s/p exlap and Hartmann's for perforated diverticulitis.  No acute events but patient is very anxious.  He reported some gas in ostomy but otherwise there is no stool output or further gas.  He's been tolerating a regular diet without any nausea or distention  Vital signs: Temp:  [97.9 F (36.6 C)-98.9 F (37.2 C)] 98.5 F (36.9 C) (11/29 1203) Pulse Rate:  [51-84] 75 (11/29 1203) Resp:  [16-20] 20 (11/29 1203) BP: (123-141)/(70-84) 141/84 (11/29 1203) SpO2:  [97 %-98 %] 97 % (11/29 1203)   Intake/Output: 11/28 0701 - 11/29 0700 In: 1200 [P.O.:1200] Out: 2975 [Urine:2975] Last BM Date: 07/20/18  Physical Exam: Constitutional: No acute distress Abdomen:  Soft, obese, nondistended, appropriately tender to palpation.  Incision is clean, dry, intact with staples.  Ostomy is viable and pink with no gas in bag and only bowel sweat.  Labs:  Recent Labs    07/25/18 0739 07/26/18 0700  WBC 10.5 9.2  HGB 11.5* 11.6*  HCT 35.3* 35.0*  PLT 236 247   Recent Labs    07/25/18 0739  NA 139  K 3.8  CL 102  CO2 28  GLUCOSE 101*  BUN 9  CREATININE 0.68  CALCIUM 8.2*   No results for input(s): LABPROT, INR in the last 72 hours.  Imaging: No results found.  Assessment/Plan: This is a 22 y.o. male s/p Hartmann's   --Discussed with him that most likely he's having post-op ileus, but reassured him and his mother that ileus is not the same as bowel obstruction.  Discussed that this is not uncommon as part of his post-op recovery due to the infection and bowel manipulation during surgery. --He may continue a regular diet but discussed with him that we may have to go back to clears if he gets distended or any nausea.  No NG tube needed at this point.  He may chew bubble gum and ambulate to help wake up his bowels.  He did get Miralax today as well. --Given his anxiety, will order Ativan prn too.  He is very nervous and wants to leave the  hospital.  Did advise him to stay.  Though I cannot force him to stay, he is aware that leaving would be against medical advise.   Melvyn Neth, [facility]

## 2018-07-27 NOTE — Consult Note (Signed)
[place] Nurse ostomy follow up Stoma type/location: LLQ colostomy Stomal assessment/size: oval 1" x 1/3/4" flush with skin with dip in abdominal topography at 3 and 9 o'clock  Peristomal assessment: intact  Treatment options for stomal/peristomal skin: using 2" barrier ring  Output three small form balls of stool  Ostomy pouching: 1pc convex due to flush stoma with 2 Barrier ring  Education provided: Explained stoma characteristics (budded, flush, color, texture, care) Demonstrated pouch change (cutting new skin barrier, measuring stoma, cleaning peristomal skin and stoma, use of barrier ring) Education on emptying when 1/3 to 1/2 full and how to empty Demonstrated use of wick to clean spout Discussed bathing, diet, gas, medication use, constipation Discussed treatment of peristomal skin and need for weekly shave of the peristomal skin to prevent folliculitis Mother is a CMA/CNA and was able to watch pouch change, however she did not participate and did not get out of recliner during pouch change.  [place] was able to open and close his pouch several times and was able to verbalize emptying and cleaning the pouch opening with wick of toilet paper.    Enrolled patient in [place] Start Discharge program: Yes  Notified bedside nurse Barnabas Lister of the small stool output.   [place] Nurse will follow along with you for continued support with ostomy teaching and care [place] MSN, RN, [place], [place], [place]

## 2018-07-27 NOTE — Care Management Note (Addendum)
Case Management Note  Patient Details  Name: Timothy [name] MRN: [id number] Date of Birth: 1996/02/18  Subjective/Objective:   Patient to be discharged per MD order. Orders in place for home health services. Patient approved for charity care through [place] care, confirmed this with [facility] before dischrge. Has new ostomy. Has been consulted on by [facility] nurse and received education but RN and [place]. Has family supports in the home. Some supplies were provided at discharge. Family to escort in private vehicle.                   Action/Plan:   Expected Discharge Date:  07/24/18               Expected Discharge Plan:  [place]  In-House Referral:     Discharge planning Services  CM Consult  Post Acute Care Choice:  Home Health Choice offered to:  Patient  DME Arranged:    DME Agency:     HH Arranged:  RN [place] Agency:  [place]  Status of Service:  Completed, signed off  If discussed at [place] of Stay Meetings, dates discussed:    Additional Comments:  Latanya Maudlin, RN 07/27/2018, 3:47 PM

## 2018-07-28 LAB — CREATININE, SERUM
Creatinine, Ser: 0.72 mg/dL (ref 0.61–1.24)
GFR calc Af Amer: >60 mL/min (ref >60)

## 2018-07-28 MED ORDER — OXYCODONE HCL 5 MG PO TABS: 5.0000 mg | ORAL_TABLET | ORAL | 0 refills | Status: DC | PRN

## 2018-07-28 MED ORDER — POLYETHYLENE GLYCOL 3350 17 G PO PACK: 17.0000 g | PACK | Freq: Every day | ORAL | 0 refills | Status: DC | PRN

## 2018-07-28 MED ORDER — AMOXICILLIN-POT CLAVULANATE 875-125 MG PO TABS: 1.0000 | ORAL_TABLET | Freq: Two times a day (BID) | ORAL | 0 refills | Status: AC

## 2018-07-28 MED ORDER — IBUPROFEN 600 MG PO TABS: 600.0000 mg | ORAL_TABLET | Freq: Three times a day (TID) | ORAL | 0 refills | Status: DC | PRN

## 2018-07-28 NOTE — Discharge Summary (Signed)
Patient ID: Timothy [name] MRN: [id number] DOB/AGE: March 13, 1996 22 y.o.  Admit date: 07/21/2018 Discharge date: 07/28/2018   Discharge Diagnoses:  Active Problems:   Diverticulitis of colon with perforation   Procedures:  Exploratory laparotomy, Hartmann's procedure  Hospital Course:  Patient was admitted on 11/23 with diverticulitis.  He had to go to OR for worsening clinical condition and underwent Hartmann's procedure.  Post-operatively, he had a mild post-op ileus and his diet was advanced slowly.  His WBC normalized and was transitioned to po abx.  JP drain was removed.  He was ambulating, his pain was well controlled, and his ostomy was functioning.  Ostomy nurse instructed him on how to change it.   On exam, patient was in no acute distress, with stable vital signs.  Abdomen was soft, nondistended, appropriately tender to palpation.  Incisions were clean, dry, intact with staples.  Ostomy was pink and viable with gas in bag.  Consults: Wound / Ostomy nurse  Disposition: Discharge disposition: 01-Home or Self Care       Discharge Instructions    Call MD for:  difficulty breathing, headache or visual disturbances   Complete by:  As directed    Call MD for:  persistant nausea and vomiting   Complete by:  As directed    Call MD for:  redness, tenderness, or signs of infection (pain, swelling, redness, odor or green/yellow discharge around incision site)   Complete by:  As directed    Call MD for:  severe uncontrolled pain   Complete by:  As directed    Call MD for:  temperature >100.4   Complete by:  As directed    Change dressing (specify)   Complete by:  As directed    Change gauze dressing once daily until drain site closed   Diet - low sodium heart healthy   Complete by:  As directed    Discharge instructions   Complete by:  As directed    1.  Patient may shower, but do not scrub wounds heavily and dab dry only. 2.  Do not submerge wounds in pool/tub for 1  week. 3.  Do not remove staples.   Driving Restrictions   Complete by:  As directed    Do not drive while taking narcotics for pain control.   Increase activity slowly   Complete by:  As directed    Lifting restrictions   Complete by:  As directed    No heavy lifting or pushing of more than 10-15 lbs for 4 weeks.     Allergies as of 07/28/2018   No Known Allergies     Medication List    STOP taking these medications   amoxicillin-clavulanate 400-57 MG/5ML suspension Commonly known as:  AUGMENTIN Replaced by:  amoxicillin-clavulanate 875-125 MG tablet   escitalopram 10 MG tablet Commonly known as:  LEXAPRO   fluticasone 50 MCG/ACT nasal spray Commonly known as:  FLONASE   ondansetron 4 MG disintegrating tablet Commonly known as:  ZOFRAN-ODT   promethazine 12.5 MG tablet Commonly known as:  PHENERGAN     TAKE these medications   amoxicillin-clavulanate 875-125 MG tablet Commonly known as:  AUGMENTIN Take 1 tablet by mouth 2 (two) times daily for 10 days. Replaces:  amoxicillin-clavulanate 400-57 MG/5ML suspension   ibuprofen 600 MG tablet Commonly known as:  ADVIL,MOTRIN Take 1 tablet (600 mg total) by mouth every 8 (eight) hours as needed for fever, mild pain or moderate pain.   oxyCODONE 5 MG immediate release tablet  Commonly known as:  Oxy IR/ROXICODONE Take 1 tablet (5 mg total) by mouth every 4 (four) hours as needed for severe pain.   polyethylene glycol packet Commonly known as:  MIRALAX / GLYCOLAX Take 17 g by mouth daily as needed for mild constipation.            Discharge Care Instructions  (From admission, onward)         Start     Ordered   07/28/18 0000  Change dressing (specify)    Comments:  Change gauze dressing once daily until drain site closed   07/28/18 0825         Follow-up Information    Mikey College, NP Follow up on 08/06/2018.   Specialty:  Nurse Practitioner Why:  Appintment time 1:40.  please arrive 15  minutes early Contact information: [address] [place] [address] [phone]        Fredirick Maudlin, MD Follow up in 1 week(s).   Specialty:  General Surgery Contact information: [address] [place] [address] [phone]

## 2018-07-28 NOTE — Progress Notes (Signed)
07/28/2018 10:46 AM  Timothy [name] to be D/C'd Home per MD order.  Discussed prescriptions and follow up appointments with the patient. Prescriptions given to patient, medication list explained in detail. Pt verbalized understanding.  Allergies as of 07/28/2018   No Known Allergies     Medication List    STOP taking these medications   amoxicillin-clavulanate 400-57 MG/5ML suspension Commonly known as:  AUGMENTIN Replaced by:  amoxicillin-clavulanate 875-125 MG tablet   escitalopram 10 MG tablet Commonly known as:  LEXAPRO   fluticasone 50 MCG/ACT nasal spray Commonly known as:  FLONASE   ondansetron 4 MG disintegrating tablet Commonly known as:  ZOFRAN-ODT   promethazine 12.5 MG tablet Commonly known as:  PHENERGAN     TAKE these medications   amoxicillin-clavulanate 875-125 MG tablet Commonly known as:  AUGMENTIN Take 1 tablet by mouth 2 (two) times daily for 10 days. Replaces:  amoxicillin-clavulanate 400-57 MG/5ML suspension   ibuprofen 600 MG tablet Commonly known as:  ADVIL,MOTRIN Take 1 tablet (600 mg total) by mouth every 8 (eight) hours as needed for fever, mild pain or moderate pain.   oxyCODONE 5 MG immediate release tablet Commonly known as:  Oxy IR/ROXICODONE Take 1 tablet (5 mg total) by mouth every 4 (four) hours as needed for severe pain.   polyethylene glycol packet Commonly known as:  MIRALAX / GLYCOLAX Take 17 g by mouth daily as needed for mild constipation.            Discharge Care Instructions  (From admission, onward)         Start     Ordered   07/28/18 0000  Change dressing (specify)    Comments:  Change gauze dressing once daily until drain site closed   07/28/18 0825          Vitals:   07/27/18 1957 07/28/18 0451  BP: 134/70 123/70  Pulse: 75 72  Resp: 20 18  Temp: 98.5 F (36.9 C) 98.5 F (36.9 C)  SpO2: 97% 99%    Skin clean, dry and intact without evidence of skin break down, no evidence of skin tears  noted. IV catheter discontinued intact. Site without signs and symptoms of complications. Dressing and pressure applied. Pt denies pain at this time. No complaints noted.  An After Visit Summary was printed and given to the patient. Patient escorted via [place], and D/C home via private auto.  [name]

## 2018-07-30 ENCOUNTER — Telehealth: Payer: Self-pay

## 2018-07-30 NOTE — Telephone Encounter (Signed)
Transition Care Management Follow-up Telephone Call  Date of discharge and from where: [facility] on 07/28/18.  How have you been since you were released from the hospital? Doing better, has not changed colostomy bag yet but has [facility] coming out to assist today. Declines pain, fever, wound issues or n/v/d.  Any questions or concerns? Ok to continue Augmentin?  Items Reviewed:  Did the pt receive and understand the discharge instructions provided? Yes   Medications obtained and verified? Yes   Any new allergies since your discharge? No   Dietary orders reviewed? Yes  Do you have support at home? Yes   Other (ie: DME, Home Health, etc) [facility] for [facility] and colostomy supplies.  Functional Questionnaire: (I = Independent and D = Dependent)  Bathing/Dressing- Has assistance.   Meal Prep- I  Eating- I  Maintaining continence- D, colostomy bag.  Transferring/Ambulation- I  Managing Meds- I   Follow up appointments reviewed:    PCP Hospital f/u appt confirmed? Yes  Scheduled to see Cassell Smiles on 08/06/18 @ 1:40 PM.  [facility] f/u appt confirmed? Apt with Dr. Celine Ahr not yet scheduled.  Are transportation arrangements needed? No   If their condition worsens, is the pt aware to call  their PCP or go to the ED? Yes  Was the patient provided with contact information for the PCP's office or ED? Yes  Was the pt encouraged to call back with questions or concerns? Yes

## 2018-07-30 NOTE — Telephone Encounter (Signed)
Im sorry, I meant to type Augmentin. Pt advised and states understanding.  -MM

## 2018-07-30 NOTE — Telephone Encounter (Signed)
Yes,  Patient should continue Augmentin as prescribed at discharge.  I do not see continued Cipro prescription, so Cipro is not needed.

## 2018-07-31 ENCOUNTER — Telehealth: Payer: Self-pay

## 2018-07-31 NOTE — Telephone Encounter (Signed)
Amy called from [place] requesting a verbal order for Home Health services for 1 time a week for 5 weeks for colostomy bag education and management.  The pt currently have and appointment scheduled for tomorrow. Her phone is [phone].

## 2018-07-31 NOTE — Telephone Encounter (Signed)
Verbal given per Cassell Smiles, NP.

## 2018-07-31 NOTE — Telephone Encounter (Signed)
Verbal order is authorized as requested.  Please call Amy and provide this.

## 2018-08-01 ENCOUNTER — Ambulatory Visit (INDEPENDENT_AMBULATORY_CARE_PROVIDER_SITE_OTHER): Payer: Self-pay | Admitting: General Surgery

## 2018-08-01 ENCOUNTER — Encounter: Payer: Self-pay | Admitting: General Surgery

## 2018-08-01 ENCOUNTER — Other Ambulatory Visit: Payer: Self-pay

## 2018-08-01 VITALS — BP 143/86 | HR 89 | Temp 97.9°F | Ht 72.0 in | Wt 238.0 lb

## 2018-08-01 DIAGNOSIS — Z9049 Acquired absence of other specified parts of digestive tract: Secondary | ICD-10-CM

## 2018-08-01 NOTE — Patient Instructions (Addendum)
Patient is to return to the office in 6 months with Dr.Cannon. Surgical Stripes will fall off by themselves do not pull them off. No lifting of more than 10 lbs for 5 weeks.   Call the office with any questions or concerns.   Polycarbophil Oral Tablet What is this medicine? POLYCARBOPHIL (pol i KAR boe fil) is a bulk-forming fiber laxative. It is used to add more fiber into your diet. This medicine is also used to treat constipation. This medicine may be used for other purposes; ask your health care provider or pharmacist if you have questions. COMMON BRAND NAME(S): Fiber Laxative, Fiber-Lax, Fiber-Tabs, Fibercon, Konsyl What should I tell my health care provider before I take this medicine? They need to know if you have any of these conditions: -change in bowel habits for more than 14 days -blocked intestines or bowel -high levels of calcium in your blood -kidney disease -stomach pain, nausea or vomiting -trouble swallowing -an unusual or allergic reaction to polycarbophil, other medicines, foods, dyes, or preservatives -pregnant or trying to get pregnant -breast-feeding How should I use this medicine? Take this medicine by mouth with a full glass of water. Follow the directions on the package label. Take your medicine at regular intervals. Do not take your medicine more often than directed. Talk to your pediatrician regarding the use of this medicine in children. Special care may be needed. Overdosage: If you think you have taken too much of this medicine contact a poison control center or emergency room at once. NOTE: This medicine is only for you. Do not share this medicine with others. What if I miss a dose? If you miss a dose, take it as soon as you can. If it is almost time for your next dose, take only that dose. Do not take double or extra doses. What may interact with this medicine? -digoxin -estramustine -ethotoin -phenytoin -some antibiotics like ciprofloxacin,  doxycycline, levofloxacin, tetracycline -strontium-89 chloride -thyroid hormones This list may not describe all possible interactions. Give your health care provider a list of all the medicines, herbs, non-prescription drugs, or dietary supplements you use. Also tell them if you smoke, drink alcohol, or use illegal drugs. Some items may interact with your medicine. What should I watch for while using this medicine? This medicine can take up to 3 days to work. Check with your doctor or health care professional if your symptoms do not get better or if they get worse. See your doctor if you have to treat your constipation for more than 1 week. Avoid taking other medicines within 2 hours of taking this medicine. Drink several glasses of water a day while you are taking this medicine. This will help to relieve constipation and prevent dehydration. What side effects may I notice from receiving this medicine? Side effects that you should report to your doctor or health care professional as soon as possible: -allergic reactions like skin rash, itching or hives, swelling of the face, lips, or tongue -breathing problems -chest pain -nausea, vomiting -rectal bleeding -trouble swallowing Side effects that usually do not require medical attention (report to your doctor or health care professional if they continue or are bothersome): -diarrhea -stomach bloat, cramps, gas This list may not describe all possible side effects. Call your doctor for medical advice about side effects. You may report side effects to FDA at 1-800-FDA-1088. Where should I keep my medicine? Keep out of the reach of children. Store at room temperature between 20 and 25 degrees C (68 and  77 degrees F). Protect from moisture. Throw away any unused medicine after the expiration date. NOTE: This sheet is a summary. It may not cover all possible information. If you have questions about this medicine, talk to your doctor, pharmacist, or  health care provider.  2018 Elsevier/Gold Standard (2008-03-21 14:21:40)

## 2018-08-01 NOTE — Progress Notes (Signed)
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  His operation was performed on 17 November.  He had a slightly prolonged hospital stay due to ileus.  He ultimately had return of bowel function and was discharged home over the past weekend.  He is here today for a postoperative check and removal of his staples.  He is accompanied by his mother.  They report that the home health nurse has been assisting with his ostomy care.  They are looking into some different appliance options to make management easier for him.  He is still fairly put off by the presence of the colostomy, but seems to be much more accepting of it than he was while he was in the hospital.  They have been experimenting with various fiber preparations in order to create the proper stool consistency.  He has not had any significant pain control issues since his discharge home.  Denies any fevers chills nausea or vomiting.  He has completed a full course of oral antibiotics for his perforated diverticulitis.  Vitals:   08/01/18 1430  BP: (!) 143/86  Pulse: 89  Temp: 97.9 F (36.6 C)  SpO2: 98%   Focused abdominal exam: Lower midline vertical incision is well approximated without erythema induration or drainage.  The staples are still present.  The right lower quadrant stab incision from his drain site has some scabbing present but again, there is no erythema induration or drainage.  Ostomy appliance is in place with some pasty stool as well as some that is of a slightly more liquid consistency.  Assessment and plan:  Timothy [name] is a 22 year old man who underwent an emergent Hartman's procedure for freely perforated diverticulitis.  Overall, he is doing well from a surgical perspective.  His staples were removed in clinic today and replaced with Steri-Strips.  He will continue to receive home ostomy assistance until he is better able to  manage the appliance on his own.  We discussed various over-the-counter products he could use to keep his stool well-bulked and of the proper consistency.  He objects to the flavor of orange Metamucil, so we discussed other available options.  He and his mother will experiment to create thick, mashed potato-consistency stool.  He is free to shower.  I have provided him with a return to work note stating a tentative return to work date of August 27, 2018.  He should continue to refrain from lifting anything greater than or equal to 10 pounds until that time.  He will return to clinic in 6 months time to discuss colostomy reversal.  Between now and then he is certainly welcome to contact us with any questions or concerns. Fredirick Maudlin 3:27 PM 08/01/18

## 2018-08-06 ENCOUNTER — Inpatient Hospital Stay: Payer: Self-pay | Admitting: Nurse Practitioner

## 2018-08-30 ENCOUNTER — Telehealth: Payer: Self-pay | Admitting: *Deleted

## 2018-08-30 NOTE — Telephone Encounter (Signed)
He states it looks like a "pimple" stoma color and the mom can see 3 on the stoma, noticed them since last Tuesday. He has another nurse visit due to come and visit(but not sure when), he will make sure she knows as well. Denies any pain, everything is "normal" and the stoma is starting to protrude now. He just wants to make sure everything is ok and the mom wants to make sure it is not diverticula. Please advise, he does not have MyChart to send in a photo. Dr Celine Ahr aware of telephone encounter, unlikely diverticula, appointment next week if desires or concerned. He will monitor for now and try to log into MyChart to try and send in a photo, MD aware.

## 2018-08-30 NOTE — Telephone Encounter (Signed)
Patient called and is concern about a lump he found on the colostomy site, its not painful but just wanted to know if it was normal or not.

## 2018-09-17 ENCOUNTER — Encounter: Payer: Self-pay | Admitting: Gastroenterology

## 2018-09-17 ENCOUNTER — Ambulatory Visit: Payer: Self-pay | Admitting: Gastroenterology

## 2018-10-01 ENCOUNTER — Other Ambulatory Visit: Payer: Self-pay

## 2018-10-01 ENCOUNTER — Telehealth: Payer: Self-pay | Admitting: *Deleted

## 2018-10-01 ENCOUNTER — Ambulatory Visit (INDEPENDENT_AMBULATORY_CARE_PROVIDER_SITE_OTHER): Payer: BLUE CROSS/BLUE SHIELD | Admitting: Gastroenterology

## 2018-10-01 ENCOUNTER — Encounter: Payer: Self-pay | Admitting: Gastroenterology

## 2018-10-01 VITALS — BP 116/74 | HR 90 | Ht 72.0 in | Wt 259.8 lb

## 2018-10-01 DIAGNOSIS — K5720 Diverticulitis of large intestine with perforation and abscess without bleeding: Secondary | ICD-10-CM | POA: Diagnosis not present

## 2018-10-01 NOTE — Telephone Encounter (Signed)
Patient came by the office wanting to know where he can get a colostomy bag cover.   Please call on Tuesday at [phone].

## 2018-10-01 NOTE — Progress Notes (Signed)
Timothy Bellows MD, MRCP(U.K) [address]  [place]  [place], [place] [address]  Main: [phone]  Fax: [phone]   Gastroenterology Consultation  Referring Provider:     Mikey [name], * Primary Care Physician:  Timothy College, NP Primary Gastroenterologist:  Dr. Jonathon [name]  Reason for Consultation:     Diverticulitis         HPI:   Timothy [name] is a 23 y.o. y/o male referred for consultation & management  by Dr. Merrilyn [name], Timothy Ivory, NP.   He underwent an emergent sigmoid colectomy with Hartman's pouch and end colostomy on 15 July 2018 for perforated diverticulitis.  Plan by Dr. Celine [name] who performed the surgery is to reverse the colostomy.  No referral reason seen on epic.  I would presume that he is being referred for [place] to perform a colonoscopy before reversal of his colostomy.  He says that he has had a few episodes of diverticulitis probably 2 . The first time was in 12/2017 and was seen at [facility] and they had recommended an outpatient colonoscopy . Surgery was deferred at that time. Around the time of his colonoscopy in 06/2018 had another episode of diverticulitis and went to the ER. Subsequently had surgery , since surgery doing well.    no known family history of colon cancer.  Past Medical History:  Diagnosis Date  . Allergy   . Depression   . Diverticulitis   . Fainting spell    in middle school    Past Surgical History:  Procedure Laterality Date  . COLECTOMY WITH COLOSTOMY CREATION/HARTMANN PROCEDURE N/A 07/22/2018   Procedure: COLECTOMY WITH COLOSTOMY CREATION/HARTMANN PROCEDURE;  Surgeon: Timothy Maudlin, MD;  Location: [facility] ORS;  Service: General;  Laterality: N/A;    Prior to Admission medications   Medication Sig Start Date End Date Taking? Authorizing Provider  ibuprofen (ADVIL,MOTRIN) 600 MG tablet Take 1 tablet (600 mg total) by mouth every 8 (eight) hours as needed for fever, mild pain or moderate pain. 07/28/18    Timothy Ree, MD  oxyCODONE (OXY IR/ROXICODONE) 5 MG immediate release tablet Take 1 tablet (5 mg total) by mouth every 4 (four) hours as needed for severe pain. Patient not taking: Reported on 08/01/2018 07/28/18   Timothy Ree, MD  psyllium (METAMUCIL) 58.6 % packet Take 1 packet by mouth daily.    [provider]    Family History  Problem Relation Age of Onset  . Heart disease Maternal Grandmother   . Diabetes Maternal Grandmother   . Cancer Maternal Grandmother        breast cancer  . BRCA 1/2 Maternal Grandmother   . Cancer Paternal Grandfather   . Healthy Sister   . BRCA 1/2 Maternal Aunt   . Depression Mother      Social History   Tobacco Use  . Smoking status: Former Smoker    Packs/day: 0.25    Types: Cigarettes  . Smokeless tobacco: Never Used  . Tobacco comment: pt smoke pack a week  Substance Use Topics  . Alcohol use: No    Alcohol/week: 0.0 standard drinks  . Drug use: No    Allergies as of 10/01/2018  . (No Known Allergies)    Review of Systems:    All systems reviewed and negative except where noted in HPI.   Physical Exam:  There were no vitals taken for this visit. No LMP for male patient. Psych:  Alert and cooperative. Normal mood and affect. General:  Alert,  Well-developed, well-nourished, pleasant and cooperative in NAD Head:  Normocephalic and atraumatic. Eyes:  Sclera clear, no icterus.   Conjunctiva pink. Ears:  Normal auditory acuity. Nose:  No deformity, discharge, or lesions. Mouth:  No deformity or lesions,oropharynx pink & moist. Neck:  Supple; no masses or thyromegaly. Lungs:  Respirations even and unlabored.  Clear throughout to auscultation.   No wheezes, crackles, or rhonchi. No acute distress. Heart:  Regular rate and rhythm; no murmurs, clicks, rubs, or gallops. Abdomen:  Normal bowel sounds. LLQ ostomy bag, looks healthy   No bruits.  Soft, non-tender and non-distended without masses, hepatosplenomegaly or hernias  noted.  No guarding or rebound tenderness.    Neurologic:  Alert and oriented x3;  grossly normal neurologically. Skin:  Intact without significant lesions or rashes. No jaundice. Lymph Nodes:  No significant cervical adenopathy. Psych:  Alert and cooperative. Normal mood and affect.  Imaging Studies: No results found.  Assessment and Plan:   Timothy [name] is a 23 y.o. y/o male has been referred to see [place] after he had an episode of diverticulitis which altered and a perforation subsequently Dr. Celine [name] performed in end colostomy with Hartman's procedure and sigmoid colectomy.  This was performed in November 2019.  Had a anoscopy.  Plan 1.  Colonoscopy.  I have discussed alternative options, risks & benefits,  which include, but are not limited to, bleeding, infection, perforation,respiratory complication & drug reaction.  The patient agrees with this plan & written consent will be obtained.    Follow up PRN  Dr Timothy Bellows MD,MRCP(U.K)

## 2018-10-02 NOTE — Telephone Encounter (Signed)
Left message for patient to return call.

## 2018-10-03 ENCOUNTER — Telehealth: Payer: Self-pay | Admitting: Gastroenterology

## 2018-10-03 NOTE — Telephone Encounter (Signed)
Pt is calling to see why he has to have colonoscopy he did not understand Doctor Vicente Males during the apt he had , Threasa Beards came onto the phone asking why patient would not have  an endoscopy  Done. Pt would like a call from nurse to explain

## 2018-10-03 NOTE — Telephone Encounter (Signed)
I talked with the pt, he will come by and pick up sample colostomy bags at office. He wanted the brown bags since he will be going back to work and what he ordered was the clear bags.

## 2018-10-04 NOTE — Telephone Encounter (Signed)
Jadijah inform   He needs a colonoscopy to rule out a colon pathology which could have caused his diverticulitis which is located inside the colon .   An Upper endoscopy looks into the stomach and does not help determine cause of diverticulitis.   C/c  Mikey College, NP   Dr [name] MD,MRCP [facility]) Gastroenterology/Hepatology Pager: [phone]

## 2018-10-04 NOTE — Telephone Encounter (Signed)
Called pt regarding his questions about the colonoscopy. Contacted pt but pt declined to speak about the procedure at this time because he's "trying to sleep". I explained to pt that he could contact our office when he's ready to discuss the procedure.

## 2018-10-22 ENCOUNTER — Telehealth: Payer: Self-pay

## 2018-10-22 NOTE — Telephone Encounter (Signed)
Pt called to reschedule colonoscopy procedure. Pt procedure has been rescheduled to 11-01-18.

## 2018-10-25 ENCOUNTER — Telehealth: Payer: Self-pay | Admitting: Gastroenterology

## 2018-10-25 NOTE — Telephone Encounter (Signed)
Pt is calling for [place] he would like a call asap before 4pm to find out what he is having a colonoscopy for pt states he has a colostomy bag  And is not sure why he needs this procedure

## 2018-10-25 NOTE — Telephone Encounter (Signed)
Called pt in regards to his questions about his upcoming colonoscopy.  Unable to contact LVM to return call

## 2018-11-01 ENCOUNTER — Ambulatory Visit
Admission: RE | Admit: 2018-11-01 | Discharge: 2018-11-01 | Disposition: A | Discharge status: Home / Self Care | Payer: BLUE CROSS/BLUE SHIELD | Attending: Gastroenterology | Admitting: Gastroenterology

## 2018-11-01 ENCOUNTER — Ambulatory Visit: Payer: BLUE CROSS/BLUE SHIELD | Admitting: Certified Registered Nurse Anesthetist

## 2018-11-01 ENCOUNTER — Encounter: Payer: Self-pay | Admitting: Certified Registered Nurse Anesthetist

## 2018-11-01 ENCOUNTER — Encounter
Admission: RE | Disposition: A | Discharge status: Home / Self Care | Payer: Self-pay | Source: Home / Self Care | Attending: Gastroenterology

## 2018-11-01 DIAGNOSIS — Z87891 Personal history of nicotine dependence: Secondary | ICD-10-CM | POA: Insufficient documentation

## 2018-11-01 DIAGNOSIS — Z09 Encounter for follow-up examination after completed treatment for conditions other than malignant neoplasm: Secondary | ICD-10-CM | POA: Insufficient documentation

## 2018-11-01 DIAGNOSIS — K5720 Diverticulitis of large intestine with perforation and abscess without bleeding: Secondary | ICD-10-CM | POA: Diagnosis not present

## 2018-11-01 DIAGNOSIS — D123 Benign neoplasm of transverse colon: Secondary | ICD-10-CM

## 2018-11-01 DIAGNOSIS — Z933 Colostomy status: Secondary | ICD-10-CM | POA: Diagnosis not present

## 2018-11-01 DIAGNOSIS — Z9049 Acquired absence of other specified parts of digestive tract: Secondary | ICD-10-CM | POA: Diagnosis not present

## 2018-11-01 DIAGNOSIS — K5780 Diverticulitis of intestine, part unspecified, with perforation and abscess without bleeding: Secondary | ICD-10-CM | POA: Diagnosis not present

## 2018-11-01 HISTORY — PX: COLONOSCOPY WITH PROPOFOL: SHX5780

## 2018-11-01 SURGERY — COLONOSCOPY WITH PROPOFOL
Anesthesia: General

## 2018-11-01 MED ORDER — PROPOFOL 10 MG/ML IV BOLUS
INTRAVENOUS | Status: DC | PRN
  Administered 2018-11-01: 30 mg via INTRAVENOUS
  Administered 2018-11-01: 80 mg via INTRAVENOUS

## 2018-11-01 MED ORDER — LIDOCAINE HCL (CARDIAC) PF 100 MG/5ML IV SOSY
PREFILLED_SYRINGE | INTRAVENOUS | Status: DC | PRN
  Administered 2018-11-01: 50 mg via INTRAVENOUS

## 2018-11-01 MED ORDER — LIDOCAINE HCL (PF) 2 % IJ SOLN
INTRAMUSCULAR | Status: AC
  Filled 2018-11-01: qty 10

## 2018-11-01 MED ORDER — PHENYLEPHRINE HCL 10 MG/ML IJ SOLN
INTRAMUSCULAR | Status: DC | PRN
  Administered 2018-11-01: 100 ug via INTRAVENOUS

## 2018-11-01 MED ORDER — PROPOFOL 500 MG/50ML IV EMUL
INTRAVENOUS | Status: DC | PRN
  Administered 2018-11-01: 175 ug/kg/min via INTRAVENOUS

## 2018-11-01 MED ORDER — PROPOFOL 500 MG/50ML IV EMUL
INTRAVENOUS | Status: AC
  Filled 2018-11-01: qty 50

## 2018-11-01 MED ORDER — LIDOCAINE HCL (PF) 1 % IJ SOLN
INTRAMUSCULAR | Status: AC
  Filled 2018-11-01: qty 2

## 2018-11-01 MED ORDER — SODIUM CHLORIDE 0.9 % IV SOLN
INTRAVENOUS | Status: DC
  Administered 2018-11-01: 1000 mL via INTRAVENOUS

## 2018-11-01 MED ORDER — LIDOCAINE HCL (PF) 1 % IJ SOLN: 2.0000 mL | Freq: Once | INTRAMUSCULAR | Status: DC

## 2018-11-01 NOTE — Anesthesia Preprocedure Evaluation (Signed)
Anesthesia Evaluation  Patient identified by MRN, date of birth, ID band Patient awake    Reviewed: Allergy & Precautions, NPO status , Patient's Chart, lab work & pertinent test results  History of Anesthesia Complications Negative for: history of anesthetic complications  Airway Mallampati: II  TM Distance: >3 FB     Dental  (+) Teeth Intact, Dental Advidsory Given   Pulmonary neg shortness of breath, neg sleep apnea, neg COPD, neg recent URI, Current Smoker, former smoker,    Pulmonary exam normal        Cardiovascular negative cardio ROS Normal cardiovascular exam     Neuro/Psych PSYCHIATRIC DISORDERS Anxiety Depression negative neurological ROS     GI/Hepatic Neg liver ROS,   Endo/Other  negative endocrine ROS  Renal/GU negative Renal ROS  negative genitourinary   Musculoskeletal negative musculoskeletal ROS (+)   Abdominal Normal abdominal exam  (+)   Peds negative pediatric ROS (+)  Hematology negative hematology ROS (+)   Anesthesia Other Findings Past Medical History: No date: Allergy No date: Depression No date: Diverticulitis No date: Fainting spell     Comment:  in middle school  Reproductive/Obstetrics                             Anesthesia Physical  Anesthesia Plan  ASA: II  Anesthesia Plan: General   Post-op Pain Management:    Induction: Intravenous  PONV Risk Score and Plan: 2 and Propofol infusion and TIVA  Airway Management Planned: Natural Airway and Nasal Cannula  Additional Equipment:   Intra-op Plan:   Post-operative Plan:   Informed Consent: I have reviewed the patients History and Physical, chart, labs and discussed the procedure including the risks, benefits and alternatives for the proposed anesthesia with the patient or authorized representative who has indicated his/her understanding and acceptance.     Dental advisory given  Plan  Discussed with: CRNA and Surgeon  Anesthesia Plan Comments:         Anesthesia Quick Evaluation

## 2018-11-01 NOTE — Op Note (Signed)
[facility] Gastroenterology [name] Procedure Date: 11/01/2018 8:58 AM MRN: [id number] Account #: [id number] Date of Birth: Feb 17, 1996 Admit Type: Outpatient Age: 23 Room: [facility] ENDO ROOM 3 Gender: Male Note Status: Finalized Procedure:            Colonoscopy Indications:          Follow-up of diverticulitis Providers:            [name] MD, MD Referring MD:         Mikey College (Referring MD) Medicines:            Monitored Anesthesia Care Complications:        No immediate complications. Procedure:            Pre-Anesthesia Assessment:                       - Prior to the procedure, a History and Physical was                        performed, and patient medications, allergies and                        sensitivities were reviewed. The patient's tolerance of                        previous anesthesia was reviewed.                       - The risks and benefits of the procedure and the                        sedation options and risks were discussed with the                        patient. All questions were answered and informed                        consent was obtained.                       - ASA Grade Assessment: II - A patient with mild                        systemic disease.                       After obtaining informed consent, the colonoscope was                        passed under direct vision. Throughout the procedure,                        the patient's blood pressure, pulse, and oxygen                        saturations were monitored continuously. The was                        introduced through the descending colostomy and                        advanced  to the the cecum, identified by the                        appendiceal orifice, IC valve and transillumination.                        The colonoscopy was performed with ease. The patient                        tolerated the procedure well. The quality of the  bowel                        preparation was good. Findings:      The perianal and digital rectal examinations were normal.      A 15 mm polyp was found in the distal transverse colon. The polyp was       pedunculated. The polyp was removed with a hot snare. Resection and       retrieval were complete. To prevent bleeding after the polypectomy, one       hemostatic clip was successfully placed. There was no bleeding during,       or at the end, of the procedure.      The exam was otherwise without abnormality.      The scope was also inserted into the anus and advanced till the sutures       were seen . No gross abnormalities noted Impression:           - One 15 mm polyp in the distal transverse colon,                        removed with a hot snare. Resected and retrieved. Clip                        was placed.                       - The examination was otherwise normal. Recommendation:       - Discharge patient to home (with escort).                       - Resume previous diet.                       - Continue present medications.                       - Await pathology results.                       - Repeat colonoscopy in 3 years for surveillance based                        on pathology results.                       - Can proceed with reversal surgery Procedure Code(s):    --- Professional ---                       [phone], Colonoscopy through stoma; with removal of  tumor(s), polyp(s), or other lesion(s) by snare                        technique Diagnosis Code(s):    --- Professional ---                       D12.3, Benign neoplasm of transverse colon (hepatic                        flexure or splenic flexure)                       K57.32, Diverticulitis of large intestine without                        perforation or abscess without bleeding CPT copyright 2018 American Medical Association. All rights reserved. The codes documented in this report are  preliminary and upon coder review may  be revised to meet current compliance requirements. [name], MD [name] MD, MD 11/01/2018 9:37:59 AM This report has been signed electronically. Number of Addenda: 0 Note Initiated On: 11/01/2018 8:58 AM Scope Withdrawal Time: 0 hours 21 minutes 29 seconds  Total Procedure Duration: 0 hours 24 minutes 26 seconds       [facility]

## 2018-11-01 NOTE — H&P (Signed)
[name], MD [address] [place], [place], [place], [address] [address], [place], [place], [place], [address] Phone: [phone]  Fax: [phone]  Primary Care Physician:  Mikey College, NP   Pre-Procedure History & Physical: HPI:  Timothy [name] is a 23 y.o. male is here for an colonoscopy.   Past Medical History:  Diagnosis Date  . Allergy   . Depression   . Diverticulitis   . Fainting spell    in middle school    Past Surgical History:  Procedure Laterality Date  . COLECTOMY WITH COLOSTOMY CREATION/HARTMANN PROCEDURE N/A 07/22/2018   Procedure: COLECTOMY WITH COLOSTOMY CREATION/HARTMANN PROCEDURE;  Surgeon: Fredirick Maudlin, MD;  Location: [facility] ORS;  Service: General;  Laterality: N/A;    Prior to Admission medications   Medication Sig Start Date End Date Taking? Authorizing Provider  ibuprofen (ADVIL,MOTRIN) 600 MG tablet Take 1 tablet (600 mg total) by mouth every 8 (eight) hours as needed for fever, mild pain or moderate pain. Patient not taking: Reported on 10/01/2018 07/28/18   Olean Ree, MD  oxyCODONE (OXY IR/ROXICODONE) 5 MG immediate release tablet Take 1 tablet (5 mg total) by mouth every 4 (four) hours as needed for severe pain. Patient not taking: Reported on 08/01/2018 07/28/18   Olean Ree, MD  psyllium (METAMUCIL) 58.6 % packet Take 1 packet by mouth daily.    [provider]    Allergies as of 10/01/2018  . (No Known Allergies)    Family History  Problem Relation Age of Onset  . Heart disease Maternal Grandmother   . Diabetes Maternal Grandmother   . Cancer Maternal Grandmother        breast cancer  . BRCA 1/2 Maternal Grandmother   . Cancer Paternal Grandfather   . Healthy Sister   . BRCA 1/2 Maternal Aunt   . Depression Mother     Social History   Socioeconomic History  . Marital status: Single    Spouse name: Not on file  . Number of children: Not on file  . Years of education: Not on file  .  Highest education level: Not on file  Occupational History  . Not on file  Social Needs  . Financial resource strain: Not on file  . Food insecurity:    Worry: Not on file    Inability: Not on file  . Transportation needs:    Medical: Not on file    Non-medical: Not on file  Tobacco Use  . Smoking status: Former Smoker    Packs/day: 0.25    Types: Cigarettes  . Smokeless tobacco: Never Used  . Tobacco comment: pt smoke pack a week  Substance and Sexual Activity  . Alcohol use: No    Alcohol/week: 0.0 standard drinks  . Drug use: No  . Sexual activity: Not on file  Lifestyle  . Physical activity:    Days per week: Not on file    Minutes per session: Not on file  . Stress: Not on file  Relationships  . Social connections:    Talks on phone: Not on file    Gets together: Not on file    Attends religious service: Not on file    Active member of club or organization: Not on file    Attends meetings of clubs or organizations: Not on file    Relationship status: Not on file  . Intimate partner violence:    Fear of current or ex partner: Not on file  Emotionally abused: Not on file    Physically abused: Not on file    Forced sexual activity: Not on file  Other Topics Concern  . Not on file  Social History Narrative  . Not on file    Review of Systems: See HPI, otherwise negative ROS  Physical Exam: BP 129/74   Pulse 82   Temp (!) 96.9 F (36.1 C) (Tympanic)   Resp 16   Ht 6' (1.829 m)   Wt 113.4 kg   SpO2 99%   BMI 33.91 kg/m  General:   Alert,  pleasant and cooperative in NAD Head:  Normocephalic and atraumatic. Neck:  Supple; no masses or thyromegaly. Lungs:  Clear throughout to auscultation, normal respiratory effort.    Heart:  +S1, +S2, Regular rate and rhythm, No edema. Abdomen:  Soft, nontender and nondistended. Normal bowel sounds, without guarding, and without rebound.   Neurologic:  Alert and  oriented x4;  grossly normal  neurologically.  Impression/Plan: Timothy T Tamas is here for an colonoscopy to be performed for perforated diverticulitis. Risks, benefits, limitations, and alternatives regarding  colonoscopy have been reviewed with the patient.  Questions have been answered.  All parties agreeable.   [name], MD  11/01/2018, 8:52 AM

## 2018-11-01 NOTE — Brief Op Note (Addendum)
Colon entered through stoma left abdomen. Rectum scoped also.

## 2018-11-01 NOTE — Anesthesia Post-op Follow-up Note (Signed)
Anesthesia QCDR form completed.        

## 2018-11-01 NOTE — Anesthesia Procedure Notes (Signed)
Date/Time: 11/01/2018 9:01 AM Performed by: Johnna Acosta, CRNA Pre-anesthesia Checklist: Patient identified, Emergency Drugs available, Suction available, Patient being monitored and Timeout performed Patient Re-evaluated:Patient Re-evaluated prior to induction Oxygen Delivery Method: Nasal cannula Preoxygenation: Pre-oxygenation with 100% oxygen Induction Type: IV induction

## 2018-11-01 NOTE — Transfer of Care (Signed)
Immediate Anesthesia Transfer of Care Note  Patient: Martinique T Dizdarevic  Procedure(s) Performed: COLONOSCOPY WITH PROPOFOL (N/A )  Patient Location: PACU  Anesthesia Type:General  Level of Consciousness: awake and alert   Airway & Oxygen Therapy: Patient Spontanous Breathing  Post-op Assessment: Report given to RN and Post -op Vital signs reviewed and stable  Post vital signs: Reviewed and stable  Last Vitals:  Vitals Value Taken Time  BP 114/89 11/01/2018  9:39 AM  Temp 37.1 C 11/01/2018  9:39 AM  Pulse 78 11/01/2018  9:40 AM  Resp 17 11/01/2018  9:40 AM  SpO2 97 % 11/01/2018  9:40 AM  Vitals shown include unvalidated device data.  Last Pain:  Vitals:   11/01/18 0937  TempSrc: Tympanic  PainSc: 0-No pain         Complications: No apparent anesthesia complications

## 2018-11-01 NOTE — Anesthesia Postprocedure Evaluation (Signed)
Anesthesia Post Note  Patient: Timothy [name]  Procedure(s) Performed: COLONOSCOPY WITH PROPOFOL (N/A )  Patient location during evaluation: Endoscopy Anesthesia Type: General Level of consciousness: awake and alert Pain management: pain level controlled Vital Signs Assessment: post-procedure vital signs reviewed and stable Respiratory status: spontaneous breathing, nonlabored ventilation, respiratory function stable and patient connected to nasal cannula oxygen Cardiovascular status: blood pressure returned to baseline and stable Postop Assessment: no apparent nausea or vomiting Anesthetic complications: no     Last Vitals:  Vitals:   11/01/18 0947 11/01/18 0957  BP: (!) 111/93 127/79  Pulse: 79 73  Resp: 15 19  Temp:    SpO2: 97% 99%    Last Pain:  Vitals:   11/01/18 0957  TempSrc:   PainSc: 0-No pain                 Martha Clan

## 2018-11-02 LAB — SURGICAL PATHOLOGY

## 2018-11-05 ENCOUNTER — Encounter: Payer: Self-pay | Admitting: Nurse Practitioner

## 2018-11-05 DIAGNOSIS — D126 Benign neoplasm of colon, unspecified: Secondary | ICD-10-CM | POA: Insufficient documentation

## 2018-11-06 ENCOUNTER — Telehealth: Payer: Self-pay

## 2018-11-06 NOTE — Telephone Encounter (Signed)
-----   Message from [name], MD sent at 11/05/2018 12:14 PM EDT ----- Timothy [name] inform that the polyp we took out was large and pre cancerous, inform he is lucky we did the colonoscopt for other reasons and found this and have taken out. He will need  1. Repeat colonoscopy in 3 years 2. All first degree relatives over the age of 29 need to have a colonoscopy  3. Refer to oncology for genetic testing to rule out lynch syndrome 4. If he wishes to discuss further then come into to see me in a few months  C/c Mikey College, NP   Dr [name] MD,MRCP [facility]) Gastroenterology/Hepatology Pager: [phone]

## 2018-11-06 NOTE — Telephone Encounter (Signed)
Called pt to inform of colonoscopy biopsy results and Dr. Georgeann Oppenheim instructions.  Unable to contact LVM to return call

## 2018-11-07 ENCOUNTER — Other Ambulatory Visit: Payer: Self-pay

## 2018-11-07 DIAGNOSIS — Z86010 Personal history of colonic polyps: Secondary | ICD-10-CM

## 2018-11-07 DIAGNOSIS — K5720 Diverticulitis of large intestine with perforation and abscess without bleeding: Secondary | ICD-10-CM

## 2018-11-07 NOTE — Telephone Encounter (Signed)
Spoke with pt and informed him of biopsy results and Dr. Georgeann Oppenheim instructions for pt to repeat colonoscopy in 3 years and to refer pt to oncology for genetic testing for Lynch Syndrome. I also informed pt of Dr. Georgeann Oppenheim suggestion for pt to suggest screening colonoscopies to his first degree relatives. Pt is aware that oncology/genetics will contact him to schedule.

## 2018-11-07 NOTE — Telephone Encounter (Signed)
-----   Message from [name], MD sent at 11/05/2018 12:14 PM EDT ----- Timothy [name] inform that the polyp we took out was large and pre cancerous, inform he is lucky we did the colonoscopt for other reasons and found this and have taken out. He will need  1. Repeat colonoscopy in 3 years 2. All first degree relatives over the age of 89 need to have a colonoscopy  3. Refer to oncology for genetic testing to rule out lynch syndrome 4. If he wishes to discuss further then come into to see me in a few months  C/c Mikey College, NP   Dr [name] MD,MRCP [facility]) Gastroenterology/Hepatology Pager: [phone]

## 2018-11-26 ENCOUNTER — Telehealth: Payer: Self-pay | Admitting: Gastroenterology

## 2018-11-26 NOTE — Telephone Encounter (Signed)
Pt is calling for his results he states he just saw that we tried to call him because his vm was not set up please call him

## 2018-12-19 ENCOUNTER — Telehealth: Payer: Self-pay | Admitting: Oncology

## 2018-12-20 ENCOUNTER — Inpatient Hospital Stay: Payer: BLUE CROSS/BLUE SHIELD | Attending: Oncology | Admitting: Licensed Clinical Social Worker

## 2018-12-20 ENCOUNTER — Encounter: Payer: Self-pay | Admitting: Licensed Clinical Social Worker

## 2018-12-20 DIAGNOSIS — Z808 Family history of malignant neoplasm of other organs or systems: Secondary | ICD-10-CM

## 2018-12-20 DIAGNOSIS — Z803 Family history of malignant neoplasm of breast: Secondary | ICD-10-CM

## 2018-12-20 DIAGNOSIS — D126 Benign neoplasm of colon, unspecified: Secondary | ICD-10-CM

## 2018-12-20 NOTE — Progress Notes (Signed)
REFERRING PROVIDER: [name], MD [address] [place], [place] [address]  PRIMARY PROVIDER:  Mikey College, NP  PRIMARY REASON FOR VISIT:  1. Tubular adenoma of colon   2. Family history of breast cancer   3. Family history of bone cancer     HISTORY OF PRESENT ILLNESS:   Mr. Scrima, a 23 y.o. male, was seen for a [facility] cancer genetics consultation at the request of Dr. Vicente Males due to a recent colonoscopy that revealed a precancerous polyp.  Mr. Kiel presents to clinic today to discuss the possibility of a hereditary predisposition to cancer, genetic testing, and to further clarify his future cancer risks, as well as potential cancer risks for family members.   Mr. Henson is a 23 y.o. male with no personal history of cancer.  He had a colonoscopy in March 2020 that revealed a tubular adenoma. He had a partial colectomy due to diverticulitis in November 2019.  He reports occasional drinking and smokes a pack of cigarettes a week.   Past Medical History:  Diagnosis Date  . Allergy   . Depression   . Diverticulitis   . Fainting spell    in middle school  . Family history of bone cancer   . Family history of breast cancer     Past Surgical History:  Procedure Laterality Date  . COLECTOMY WITH COLOSTOMY CREATION/HARTMANN PROCEDURE N/A 07/22/2018   Procedure: COLECTOMY WITH COLOSTOMY CREATION/HARTMANN PROCEDURE;  Surgeon: Fredirick Maudlin, MD;  Location: [facility] ORS;  Service: General;  Laterality: N/A;  . COLONOSCOPY WITH PROPOFOL N/A 11/01/2018   Procedure: COLONOSCOPY WITH PROPOFOL;  Surgeon: [name], MD;  Location: [facility] ENDOSCOPY;  Service: Gastroenterology;  Laterality: N/A;    Social History   Socioeconomic History  . Marital status: Single    Spouse name: Not on file  . Number of children: Not on file  . Years of education: Not on file  . Highest education level: Not on file  Occupational History  . Not on file  Social Needs  . Financial  resource strain: Not on file  . Food insecurity:    Worry: Not on file    Inability: Not on file  . Transportation needs:    Medical: Not on file    Non-medical: Not on file  Tobacco Use  . Smoking status: Former Smoker    Packs/day: 0.25    Types: Cigarettes  . Smokeless tobacco: Never Used  . Tobacco comment: pt smoke pack a week  Substance and Sexual Activity  . Alcohol use: No    Alcohol/week: 0.0 standard drinks  . Drug use: No  . Sexual activity: Not on file  Lifestyle  . Physical activity:    Days per week: Not on file    Minutes per session: Not on file  . Stress: Not on file  Relationships  . Social connections:    Talks on phone: Not on file    Gets together: Not on file    Attends religious service: Not on file    Active member of club or organization: Not on file    Attends meetings of clubs or organizations: Not on file    Relationship status: Not on file  Other Topics Concern  . Not on file  Social History Narrative  . Not on file     FAMILY HISTORY:  We obtained a detailed, 4-generation family history.  Significant diagnoses are listed below: Family History  Problem Relation Age of Onset  .  Heart disease Maternal Grandmother   . Diabetes Maternal Grandmother   . Cancer Maternal Grandmother        breast cancer  . Alport syndrome Maternal Grandmother   . Cancer Paternal Grandfather        Bone cancer  . Healthy Sister   . Alport syndrome Maternal Aunt   . Depression Mother   . Cervical cancer Mother     Mr. Cordell does not have children. He has one sister, age 83, and a paternal half sister, age 7. No cancers.  Mr. Furness mother is living at 98. She had cervical cancer in her 81s. Mr. Zoll has one maternal aunt. She has Alport syndrome and MS, no history of cancer. Mr. Tolan maternal grandmother had breast cancer, unknown age, and also has Alport syndrome. No information is known about his maternal grandfather.   Mr. Bribiesca father is living  at 13. Mr. Buchler had two paternal uncles who are living and one paternal aunt who is deceased. No known cancers for these individuals or for paternal cousins. The patient's paternal grandmother is living at 42, no cancers. His paternal grandfather had bone cancer and is deceased.   Mr. Smigiel is unaware of previous family history of genetic testing for hereditary cancer risks. Patient's maternal ancestors are of [place], [place] descent, and paternal ancestors are of Caucasian descent. There is no reported Ashkenazi Jewish ancestry. There is no known consanguinity.  GENETIC COUNSELING ASSESSMENT: Martinique T Cielo is a 23 y.o. male with a personal history which is somewhat suggestive of a Hereditary Cancer Predisposition Syndrome. We, therefore, discussed and recommended the following at today's visit.   DISCUSSION: We discussed that about 5-10% of cancer cases are hereditary. We also discussed polyposis conditions such as FAP, and hereditary colon cancer syndromes such as Lynch syndrome. We reviewed the characteristics, features and inheritance patterns of hereditary cancer syndromes. We also discussed genetic testing, including the appropriate family members to test, the process of testing, insurance coverage and turn-around-time for results. We discussed the implications of a negative, positive and/or variant of uncertain significant result. While Mr. Zuercher does not meet criteria for genetic testing based on his personal history and family history, we offered for Mr. Allnutt to pursue genetic testing for the [facility] Common Hereditary Cancers Panel.   The Common Hereditary Cancers Panel offered by [facility] includes sequencing and/or deletion duplication testing of the following 48 genes: APC, ATM, AXIN2, BARD1, BMPR1A, BRCA1, BRCA2, BRIP1, CDH1, CDKN2A (p14ARF), CDKN2A (p16INK4a), CKD4, CHEK2, CTNNA1, DICER1, EPCAM (Deletion/duplication testing only), GREM1 (promoter region deletion/duplication  testing only), KIT, MEN1, MLH1, MSH2, MSH3, MSH6, MUTYH, NBN, NF1, NHTL1, PALB2, PDGFRA, PMS2, POLD1, POLE, PTEN, RAD50, RAD51C, RAD51D, RNF43, SDHB, SDHC, SDHD, SMAD4, SMARCA4. STK11, TP53, TSC1, TSC2, and VHL.  The following genes were evaluated for sequence changes only: SDHA and HOXB13 c.251G>A variant only.  We discussed that if he is found to have a mutation in one of these genes, it may impact future medical management recommendations such as increased cancer screenings and consideration of risk reducing surgeries.  A positive result could also have implications for the patient's family members.  A Negative result would mean we were unable to identify a hereditary component to his personal history of colon polyp and family history of cancer but does not rule out the possibility of a hereditary basis.  There could be mutations that are undetectable by current technology, or in genes not yet tested or identified to increase cancer risk.  We discussed the potential to find a Variant of Uncertain Significance or VUS.  These are variants that have not yet been identified as pathogenic or benign, and it is unknown if this variant is associated with increased cancer risk or if this is a normal finding.  Most VUS's are reclassified to benign or likely benign.   It should not be used to make medical management decisions. With time, we suspect the lab will determine the significance of any VUS's identified if any.   Based on Mr. Geier personal history of a tubular adenoma and family history of cancer, he does not meet medical criteria for genetic testing. However, we still offered him the option to pursue testing and he would likely pay the self-pay price of $250.    PLAN: Despite our recommendation, Mr. Armistead did not wish to pursue genetic testing at today's visit. We understand this decision, and remain available to coordinate genetic testing at any time in the future. We; therefore, recommend Mr. Shartzer  continue to follow the cancer screening guidelines given by his primary healthcare provider.  Lastly, we encouraged Mr. Dorton to remain in contact with cancer genetics annually so that we can continuously update the family history and inform him of any changes in cancer genetics and testing that may be of benefit for this family.   Mr.  Wenrich questions were answered to his satisfaction today. Our contact information was provided should additional questions or concerns arise. Thank you for the referral and allowing [place] to share in the care of your patient.    Faith Rogue, MS Genetic Counselor [email or web address] Phone: [phone]  The patient was seen for a total of 40 minutes in virtual genetic counseling.  The patient was accompanied today by his mother. Dr. Grayland Ormond was available for questions regarding this case.

## 2019-01-28 ENCOUNTER — Ambulatory Visit: Payer: Self-pay | Admitting: General Surgery

## 2019-01-30 ENCOUNTER — Telehealth: Payer: Self-pay

## 2019-01-30 ENCOUNTER — Other Ambulatory Visit: Payer: Self-pay

## 2019-01-30 ENCOUNTER — Ambulatory Visit (INDEPENDENT_AMBULATORY_CARE_PROVIDER_SITE_OTHER): Payer: BC Managed Care – PPO | Admitting: General Surgery

## 2019-01-30 ENCOUNTER — Encounter: Payer: Self-pay | Admitting: General Surgery

## 2019-01-30 VITALS — BP 131/80 | HR 80 | Temp 98.1°F | Resp 16 | Ht 72.0 in | Wt 249.6 lb

## 2019-01-30 DIAGNOSIS — Z9049 Acquired absence of other specified parts of digestive tract: Secondary | ICD-10-CM

## 2019-01-30 DIAGNOSIS — Z933 Colostomy status: Secondary | ICD-10-CM | POA: Diagnosis not present

## 2019-01-30 MED ORDER — POLYETHYLENE GLYCOL 3350 17 GM/SCOOP PO POWD: 1.0000 | Freq: Once | ORAL | 0 refills | Status: AC

## 2019-01-30 MED ORDER — ERYTHROMYCIN BASE 500 MG PO TABS: 1000.0000 mg | ORAL_TABLET | Freq: Three times a day (TID) | ORAL | 0 refills | Status: DC

## 2019-01-30 MED ORDER — BISACODYL 5 MG PO TBEC: 5.0000 mg | DELAYED_RELEASE_TABLET | Freq: Once | ORAL | 0 refills | Status: AC

## 2019-01-30 MED ORDER — NEOMYCIN SULFATE 500 MG PO TABS: 1000.0000 mg | ORAL_TABLET | Freq: Three times a day (TID) | ORAL | 0 refills | Status: DC

## 2019-01-30 NOTE — Progress Notes (Signed)
The patient's surgery is rescheduled to 03/22/19 with Dr Celine Ahr at [facility]. Dr Dahlia Byes will be assisting. The patient is aware of all instructions. We will call him with his pre admit date and time.

## 2019-01-30 NOTE — Progress Notes (Signed)
Patient ID: Timothy T Gupta, male   DOB: 1996/08/25, 23 y.o.   MRN: [id number]  Chief Complaint  Patient presents with  . Follow-up    Diverticulitis    HPI Timothy [name] is a 23 y.o. male.   HPI he presented to the emergency department at [facility] in November 2020.  He had perforated diverticulitis.  He underwent a Hartmann procedure.  He is here today to discuss reversal of his ostomy.    He states that he has gotten more comfortable with managing his ostomy.  He was advised to use fiber supplements to keep his stools at a thick mashed potato consistency.  He says that he felt like he did not need the fiber supplements but sometimes has hard stools intermittently.  He says that when he drinks more water, his stool is more mushy.  He denies any fever, chills, nausea, or vomiting.  He is not experiencing any pain.  He did undergo colonoscopy with Dr. Vicente Males.  A tubular adenoma was removed from his transverse colon.  Was negative for high-grade dysplasia or malignancy.  He was seen in genetic counseling on April 23.  He declined genetic testing at that time.  Past Medical History:  Diagnosis Date  . Allergy   . Depression   . Diverticulitis   . Fainting spell    in middle school  . Family history of bone cancer   . Family history of breast cancer     Past Surgical History:  Procedure Laterality Date  . COLECTOMY WITH COLOSTOMY CREATION/HARTMANN PROCEDURE N/A 07/22/2018   Procedure: COLECTOMY WITH COLOSTOMY CREATION/HARTMANN PROCEDURE;  Surgeon: Fredirick Maudlin, MD;  Location: [facility] ORS;  Service: General;  Laterality: N/A;  . COLONOSCOPY WITH PROPOFOL N/A 11/01/2018   Procedure: COLONOSCOPY WITH PROPOFOL;  Surgeon: [name], MD;  Location: [facility] ENDOSCOPY;  Service: Gastroenterology;  Laterality: N/A;    Family History  Problem Relation Age of Onset  . Heart disease Maternal Grandmother   . Diabetes Maternal Grandmother   . Cancer Maternal Grandmother        breast cancer  . Alport  syndrome Maternal Grandmother   . Cancer Paternal Grandfather        Bone cancer  . Healthy Sister   . Alport syndrome Maternal Aunt   . Depression Mother   . Cervical cancer Mother     Social History Social History   Tobacco Use  . Smoking status: Former Smoker    Packs/day: 0.25    Types: Cigarettes    Last attempt to quit: 01/30/2019  . Smokeless tobacco: Never Used  . Tobacco comment: pt smoke pack a week  Substance Use Topics  . Alcohol use: No    Alcohol/week: 0.0 standard drinks    Comment: occassionally  . Drug use: No    No Known Allergies  Current Outpatient Medications  Medication Sig Dispense Refill  . ibuprofen (ADVIL,MOTRIN) 600 MG tablet Take 1 tablet (600 mg total) by mouth every 8 (eight) hours as needed for fever, mild pain or moderate pain. 30 tablet 0  . bisacodyl (DULCOLAX) 5 MG EC tablet Take 1 tablet (5 mg total) by mouth once for 1 dose. 4 tablet 0  . erythromycin base (E-MYCIN) 500 MG tablet Take 2 tablets (1,000 mg total) by mouth 3 (three) times daily. 6 tablet 0  . neomycin (MYCIFRADIN) 500 MG tablet Take 2 tablets (1,000 mg total) by mouth 3 (three) times daily. 6 tablet 0  . polyethylene glycol powder (GLYCOLAX/MIRALAX) 17  GM/SCOOP powder Take 255 g by mouth once for 1 dose. 255 g 0   No current facility-administered medications for this visit.     Review of Systems Review of Systems  All other systems reviewed and are negative.   Blood pressure 131/80, pulse 80, temperature 98.1 F (36.7 C), temperature source Temporal, resp. rate 16, height 6' (1.829 m), weight 249 lb 9.6 oz (113.2 kg), SpO2 96 %.  Physical Exam Physical Exam Constitutional:      General: He is not in acute distress.    Appearance: Normal appearance. He is obese.  HENT:     Head: Normocephalic and atraumatic.     Nose:     Comments: Covered by mask secondary to COVID-19 precautions    Mouth/Throat:     Comments: Covered by mask secondary to COVID-19  precautions Eyes:     General: No scleral icterus.    Pupils: Pupils are equal, round, and reactive to light.  Neck:     Musculoskeletal: Normal range of motion.  Cardiovascular:     Rate and Rhythm: Normal rate and regular rhythm.     Heart sounds: No murmur.  Pulmonary:     Effort: Pulmonary effort is normal.     Breath sounds: Normal breath sounds.  Abdominal:     General: Bowel sounds are normal.     Palpations: Abdomen is soft.     Comments: Hypertrophic scar from previous surgery and drain.  Left-sided ostomy is pink and productive of formed dark brown stool.  Genitourinary:    Comments: Deferred Musculoskeletal: Normal range of motion.        General: No swelling.  Skin:    General: Skin is warm and dry.  Neurological:     General: No focal deficit present.     Mental Status: He is alert.  Psychiatric:     Comments: Anxious, as is his baseline.     Data Reviewed I reviewed the report of his colonoscopy, performed by Dr. Vicente Males.  This describes a tubular adenoma in the transverse colon measuring 15 millimeters, removed with a hot snare.  The pathology report was also reviewed demonstrating no high-grade dysplasia or malignancy.  I read the note from oncology genetics counseling from 23 April, in which he declined further genetic testing.  Assessment This is a 23 year old male who underwent a Hartmann procedure for perforated diverticulitis in November 2019.  Today, we discussed reversal of his ostomy.  He had a number of questions regarding this, including whether or not the presence of the adenoma that was removed would preclude him from reversal.  He was reassured that this was not a contraindication, food recommendations from the colonoscopy were discussed with him, specifically that he needs to have another scope in 3 years.  Plan We will schedule him for ostomy reversal.  I discussed the risks with him including bleeding, infection, anastomotic leak, need for possible  diverting ileostomy, presence of an open wound at the colostomy site that will require packing, inability to successfully reanastomose him leading to permanent ostomy.  He had a number of questions which were answered to his satisfaction.  We will make arrangements for him to be scheduled at a time when 1 of my partners is available to assist me.    Fredirick Maudlin 01/30/2019, 9:54 AM

## 2019-01-30 NOTE — Telephone Encounter (Signed)
Spoke with [employer] and Erythromycin is not available. Spoke with Dr Celine Ahr and let her know. She said to not have the patient take the Erythromycin. Patient notified of such.

## 2019-01-30 NOTE — Patient Instructions (Addendum)
We will schedule your surgery. Please be sure to follow the bowel prep instructions. You will always need to give yourself enemas.  Please pick up your medications at the pharmacy.    Diverticulitis  Diverticulitis is when small pockets in your large intestine (colon) get infected or swollen. This causes stomach pain and watery poop (diarrhea). These pouches are called diverticula. They form in people who have a condition called diverticulosis. Follow these instructions at home: Medicines  Take over-the-counter and prescription medicines only as told by your doctor. These include: ? Antibiotics. ? Pain medicines. ? Fiber pills. ? Probiotics. ? Stool softeners.  Do not drive or use heavy machinery while taking prescription pain medicine.  If you were prescribed an antibiotic, take it as told. Do not stop taking it even if you feel better. General instructions   Follow a diet as told by your doctor.  When you feel better, your doctor may tell you to change your diet. You may need to eat a lot of fiber. Fiber makes it easier to poop (have bowel movements). Healthy foods with fiber include: ? Berries. ? Beans. ? Lentils. ? Green vegetables.  Exercise 3 or more times a week. Aim for 30 minutes each time. Exercise enough to sweat and make your heart beat faster.  Keep all follow-up visits as told. This is important. You may need to have an exam of the large intestine. This is called a colonoscopy. Contact a doctor if:  Your pain does not get better.  You have a hard time eating or drinking.  You are not pooping like normal. Get help right away if:  Your pain gets worse.  Your problems do not get better.  Your problems get worse very fast.  You have a fever.  You throw up (vomit) more than one time.  You have poop that is: ? Bloody. ? Black. ? Tarry. Summary  Diverticulitis is when small pockets in your large intestine (colon) get infected or swollen.  Take  medicines only as told by your doctor.  Follow a diet as told by your doctor. This information is not intended to replace advice given to you by your health care provider. Make sure you discuss any questions you have with your health care provider. Document Released: 02/01/2008 Document Revised: 09/01/2016 Document Reviewed: 09/01/2016 Elsevier Interactive Patient Education  2019 [employer].

## 2019-01-30 NOTE — Progress Notes (Signed)
Patient's surgery to be scheduled for 03/19/19 at [facility] with Dr. Celine Ahr, Dr Dahlia Byes will assist.   The patient is aware he will need to Pre-Admit prior to surgery and will have Covid testing do at that time. Patient will check in at the [place] entrance due to COVID-19 restrictions and will then be escorted to the [place], [address] (first floor). Patient will be contacted once Pre-admission appointment has been arranged with date and time.   Patient aware to be NPO after midnight and have a driver.   He is aware to check in at the [place] entrance where he/she will be screened for the coronavirus and then sent to Same Day Surgery.   Patient aware that he may have no visitors and driver will need to wait in the car due to COVID-19 restrictions.   Miralax, Dulcolax, and antibiotic prescriptions have been sent into the patient's pharmacy. He is aware of prep instructions.   The patient verbalizes understanding of the above.   The patient is aware to call the office should he have further questions.

## 2019-02-01 ENCOUNTER — Telehealth: Payer: Self-pay

## 2019-02-04 ENCOUNTER — Telehealth: Payer: Self-pay

## 2019-02-04 NOTE — Telephone Encounter (Signed)
Error

## 2019-02-04 NOTE — Telephone Encounter (Signed)
Patient notified that he will not have to pre admit at the hospital on 03/15/19. He will be a phone interview for that day. He will need to go for Covid-19 testing at [facility] on 03/19/19 between 10:30 am and 12:30 pm. He will stay in his car for the testing.

## 2019-02-06 ENCOUNTER — Other Ambulatory Visit: Payer: Self-pay | Admitting: General Surgery

## 2019-02-06 DIAGNOSIS — Z9049 Acquired absence of other specified parts of digestive tract: Secondary | ICD-10-CM

## 2019-02-26 ENCOUNTER — Emergency Department
Admission: EM | Admit: 2019-02-26 | Discharge: 2019-02-26 | Disposition: A | Discharge status: Home / Self Care | Payer: BC Managed Care – PPO | Attending: Emergency Medicine | Admitting: Emergency Medicine

## 2019-02-26 ENCOUNTER — Other Ambulatory Visit: Payer: Self-pay

## 2019-02-26 DIAGNOSIS — R109 Unspecified abdominal pain: Secondary | ICD-10-CM | POA: Insufficient documentation

## 2019-02-26 DIAGNOSIS — K5900 Constipation, unspecified: Secondary | ICD-10-CM | POA: Diagnosis not present

## 2019-02-26 DIAGNOSIS — Z5321 Procedure and treatment not carried out due to patient leaving prior to being seen by health care provider: Secondary | ICD-10-CM | POA: Diagnosis not present

## 2019-02-26 DIAGNOSIS — R110 Nausea: Secondary | ICD-10-CM | POA: Insufficient documentation

## 2019-02-26 LAB — CBC
HCT: 44.7 % (ref 39.0–52.0)
Hemoglobin: 15 g/dL (ref 13.0–17.0)
MCH: 29.8 pg (ref 26.0–34.0)
MCHC: 33.6 g/dL (ref 30.0–36.0)
MCV: 88.7 fL (ref 80.0–100.0)
Platelets: 243 10*3/uL (ref 150–400)
RBC: 5.04 MIL/uL (ref 4.22–5.81)
RDW: 13.2 % (ref 11.5–15.5)
WBC: 10 10*3/uL (ref 4.0–10.5)
nRBC: 0 % (ref 0.0–0.2)

## 2019-02-26 LAB — COMPREHENSIVE METABOLIC PANEL
ALT: 20 U/L (ref 0–44)
AST: 17 U/L (ref 15–41)
Albumin: 4.5 g/dL (ref 3.5–5.0)
Alkaline Phosphatase: 78 U/L (ref 38–126)
Anion gap: 9 (ref 5–15)
BUN: 13 mg/dL (ref 6–20)
CO2: 24 mmol/L (ref 22–32)
Calcium: 9.2 mg/dL (ref 8.9–10.3)
Chloride: 106 mmol/L (ref 98–111)
Creatinine, Ser: 0.77 mg/dL (ref 0.61–1.24)
GFR calc Af Amer: >60 mL/min (ref >60)
GFR calc non Af Amer: >60 mL/min (ref >60)
Glucose, Bld: 106 mg/dL — ABNORMAL HIGH (ref 70–99)
Potassium: 4 mmol/L (ref 3.5–5.1)
Sodium: 139 mmol/L (ref 135–145)
Total Bilirubin: 0.5 mg/dL (ref 0.3–1.2)
Total Protein: 7.4 g/dL (ref 6.5–8.1)

## 2019-02-26 LAB — LIPASE, BLOOD: Lipase: 29 U/L (ref 11–51)

## 2019-02-26 MED ORDER — SODIUM CHLORIDE 0.9% FLUSH: 3.0000 mL | Freq: Once | INTRAVENOUS | Status: DC

## 2019-02-26 NOTE — ED Triage Notes (Addendum)
Pt comes via POV from home with c/o abdominal pain. Pt states he feels he may be constipated. Pt states he has still be regular, but just feels like he may still be constipated. Pt states right sided pain. Pt also states he had surgery in November and has colostomy placed.   Pt also states he has not been eating the right food and that could be the problem.  Pt states some nausea after eating.

## 2019-03-15 ENCOUNTER — Encounter
Admission: RE | Admit: 2019-03-15 | Discharge: 2019-03-15 | Disposition: A | Discharge status: Home / Self Care | Payer: BC Managed Care – PPO | Source: Ambulatory Visit | Attending: General Surgery | Admitting: General Surgery

## 2019-03-15 ENCOUNTER — Telehealth: Payer: Self-pay | Admitting: *Deleted

## 2019-03-15 ENCOUNTER — Encounter: Payer: Self-pay | Admitting: *Deleted

## 2019-03-15 ENCOUNTER — Other Ambulatory Visit: Payer: Self-pay

## 2019-03-15 DIAGNOSIS — Z01812 Encounter for preprocedural laboratory examination: Secondary | ICD-10-CM | POA: Diagnosis not present

## 2019-03-15 DIAGNOSIS — Z9049 Acquired absence of other specified parts of digestive tract: Secondary | ICD-10-CM

## 2019-03-15 MED ORDER — FLEET ENEMA 7-19 GM/118ML RE ENEM
1.0000 | ENEMA | Freq: Once | RECTAL | Status: DC
  Filled 2019-03-15: qty 1

## 2019-03-15 MED ORDER — CHLORHEXIDINE GLUCONATE CLOTH 2 % EX PADS
6.0000 | MEDICATED_PAD | Freq: Once | CUTANEOUS | Status: DC
  Filled 2019-03-15: qty 6

## 2019-03-15 NOTE — Patient Instructions (Signed)
Your procedure is scheduled on: 03/22/2019 Fri Report to Same Day Surgery 2nd floor medical mall [facility] Entrance-take elevator on left to 2nd floor.  Check in with surgery information desk.) To find out your arrival time please call [phone] between 1PM - 3PM on 03/21/2019 Thurs  Remember: Instructions that are not followed completely may result in serious medical risk, up to and including death, or upon the discretion of your surgeon and anesthesiologist your surgery may need to be rescheduled.    _x___ 1. Do not eat food after midnight the night before your procedure. You may drink clear liquids up to 2 hours before you are scheduled to arrive at the hospital for your procedure.  Do not drink clear liquids within 2 hours of your scheduled arrival to the hospital.  Clear liquids include  --Water or Apple juice without pulp  --Clear carbohydrate beverage such as ClearFast or Gatorade  --Black Coffee or Clear Tea (No milk, no creamers, do not add anything to                  the coffee or Tea Type 1 and type 2 diabetics should only drink water.   ____Ensure clear carbohydrate drink on the way to the hospital for bariatric patients  ____Ensure clear carbohydrate drink 3 hours before surgery for Dr Dwyane Luo patients if physician instructed.   No gum chewing or hard candies.     __x__ 2. No Alcohol for 24 hours before or after surgery.   __x__3. No Smoking or e-cigarettes for 24 prior to surgery.  Do not use any chewable tobacco products for at least 6 hour prior to surgery   ____  4. Bring all medications with you on the day of surgery if instructed.    __x__ 5. Notify your doctor if there is any change in your medical condition     (cold, fever, infections).    x___6. On the morning of surgery brush your teeth with toothpaste and water.  You may rinse your mouth with mouth wash if you wish.  Do not swallow any toothpaste or mouthwash.   Do not wear jewelry, make-up,  hairpins, clips or nail polish.  Do not wear lotions, powders, or perfumes. You may wear deodorant.  Do not shave 48 hours prior to surgery. Men may shave face and neck.  Do not bring valuables to the hospital.    [facility] is not responsible for any belongings or valuables.               Contacts, dentures or bridgework may not be worn into surgery.  Leave your suitcase in the car. After surgery it may be brought to your room.  For patients admitted to the hospital, discharge time is determined by your                       treatment team.  _  Patients discharged the day of surgery will not be allowed to drive home.  You will need someone to drive you home and stay with you the night of your procedure.    Please read over the following fact sheets that you were given:   [facility] Preparing for Surgery and or MRSA Information   _x___ Take anti-hypertensive listed below, cardiac, seizure, asthma,     anti-reflux and psychiatric medicines. These include:  1. erythromycin base (E-MYCIN) 500 MG tablet  2.erythromycin base (E-MYCIN) 500 MG tablet  3.  4.  5.  6.  __x__Fleets enema or Magnesium Citrate as directed. The night before and the morning of surgery.  _x___ Use CHG Soap or sage wipes as directed on instruction sheet   ____ Use inhalers on the day of surgery and bring to hospital day of surgery  ____ Stop Metformin and Janumet 2 days prior to surgery.    ____ Take 1/2 of usual insulin dose the night before surgery and none on the morning     surgery.   _x___ Follow recommendations from Cardiologist, Pulmonologist or PCP regarding          stopping Aspirin, Coumadin, Plavix ,Eliquis, Effient, or Pradaxa, and Pletal.  X____Stop Anti-inflammatories such as Advil, Aleve, Ibuprofen, Motrin, Naproxen, Naprosyn, Goodies powders or aspirin products. OK to take Tylenol and                          Celebrex.   _x___ Stop supplements until after surgery.  But may continue Vitamin D,  Vitamin B,       and multivitamin.   ____ Bring C-Pap to the hospital.

## 2019-03-15 NOTE — OR Nursing (Signed)
Fleets enemas given along with instructions.

## 2019-03-15 NOTE — Telephone Encounter (Signed)
Patient called and stated that he has not gotten the erythromycin for surgery and also he wanted to know if he needs to take one or 2 fleet enemas before surgery, he is confused because pre admit is telling him something different than what Dr.Cannon had told him at his office visit, he just wants to make sure he is doing everything right. Please call and advise.

## 2019-03-15 NOTE — Telephone Encounter (Signed)
Spoke with patient and let him know he did not get the Erythromycin due to is not being available. He will take the Neomycin instead. He will only do one Fleets enema 2-3 days prior to surgery per instructions. He is aware of correct instructions.

## 2019-03-18 ENCOUNTER — Other Ambulatory Visit: Payer: Self-pay

## 2019-03-18 ENCOUNTER — Other Ambulatory Visit
Admission: RE | Admit: 2019-03-18 | Discharge: 2019-03-18 | Disposition: A | Discharge status: Home / Self Care | Payer: BC Managed Care – PPO | Source: Ambulatory Visit | Attending: General Surgery | Admitting: General Surgery

## 2019-03-18 DIAGNOSIS — Z1159 Encounter for screening for other viral diseases: Secondary | ICD-10-CM | POA: Insufficient documentation

## 2019-03-19 ENCOUNTER — Other Ambulatory Visit: Payer: BC Managed Care – PPO

## 2019-03-19 LAB — SARS CORONAVIRUS 2 (TAT 6-24 HRS): SARS Coronavirus 2: NEGATIVE

## 2019-03-21 MED ORDER — SODIUM CHLORIDE 0.9 % IV SOLN
2.0000 g | INTRAVENOUS | Status: AC
  Administered 2019-03-22: 2 g via INTRAVENOUS
  Filled 2019-03-21: qty 2

## 2019-03-22 ENCOUNTER — Inpatient Hospital Stay
Admission: RE | Admit: 2019-03-22 | Discharge: 2019-03-25 | DRG: 346 | Disposition: A | Discharge status: Home / Self Care | Payer: BC Managed Care – PPO | Attending: General Surgery | Admitting: General Surgery

## 2019-03-22 ENCOUNTER — Inpatient Hospital Stay: Payer: BC Managed Care – PPO | Admitting: Anesthesiology

## 2019-03-22 ENCOUNTER — Encounter: Payer: Self-pay | Admitting: *Deleted

## 2019-03-22 ENCOUNTER — Encounter
Admission: RE | Disposition: A | Discharge status: Home / Self Care | Payer: Self-pay | Source: Home / Self Care | Attending: General Surgery

## 2019-03-22 ENCOUNTER — Other Ambulatory Visit: Payer: Self-pay

## 2019-03-22 DIAGNOSIS — Z87891 Personal history of nicotine dependence: Secondary | ICD-10-CM | POA: Diagnosis not present

## 2019-03-22 DIAGNOSIS — E669 Obesity, unspecified: Secondary | ICD-10-CM | POA: Diagnosis present

## 2019-03-22 DIAGNOSIS — Z433 Encounter for attention to colostomy: Principal | ICD-10-CM

## 2019-03-22 DIAGNOSIS — Z6833 Body mass index (BMI) 33.0-33.9, adult: Secondary | ICD-10-CM

## 2019-03-22 DIAGNOSIS — Z9049 Acquired absence of other specified parts of digestive tract: Secondary | ICD-10-CM | POA: Diagnosis not present

## 2019-03-22 DIAGNOSIS — K5732 Diverticulitis of large intestine without perforation or abscess without bleeding: Secondary | ICD-10-CM | POA: Diagnosis not present

## 2019-03-22 DIAGNOSIS — F419 Anxiety disorder, unspecified: Secondary | ICD-10-CM | POA: Diagnosis present

## 2019-03-22 HISTORY — PX: COLOSTOMY REVERSAL: SHX5782

## 2019-03-22 HISTORY — DX: Dyspnea, unspecified: R06.00

## 2019-03-22 LAB — CBC
HCT: 47.5 % (ref 39.0–52.0)
Hemoglobin: 15.9 g/dL (ref 13.0–17.0)
MCH: 30.2 pg (ref 26.0–34.0)
MCHC: 33.5 g/dL (ref 30.0–36.0)
MCV: 90.1 fL (ref 80.0–100.0)
Platelets: 206 10*3/uL (ref 150–400)
RBC: 5.27 MIL/uL (ref 4.22–5.81)
RDW: 13 % (ref 11.5–15.5)
WBC: 26.1 10*3/uL — ABNORMAL HIGH (ref 4.0–10.5)
nRBC: 0 % (ref 0.0–0.2)

## 2019-03-22 LAB — CREATININE, SERUM
Creatinine, Ser: 0.86 mg/dL (ref 0.61–1.24)
GFR calc Af Amer: >60 mL/min (ref >60)
GFR calc non Af Amer: >60 mL/min (ref >60)

## 2019-03-22 SURGERY — COLOSTOMY REVERSAL
Anesthesia: General

## 2019-03-22 MED ORDER — OXYCODONE HCL 5 MG/5ML PO SOLN
5.0000 mg | ORAL | Status: DC | PRN
  Administered 2019-03-22: 5 mg via ORAL
  Filled 2019-03-22: qty 5

## 2019-03-22 MED ORDER — HYDROMORPHONE HCL 1 MG/ML IJ SOLN: INTRAMUSCULAR | Status: DC | PRN

## 2019-03-22 MED ORDER — DEXMEDETOMIDINE HCL IN NACL 80 MCG/20ML IV SOLN
INTRAVENOUS | Status: AC
  Filled 2019-03-22: qty 20

## 2019-03-22 MED ORDER — ENOXAPARIN SODIUM 40 MG/0.4ML ~~LOC~~ SOLN
40.0000 mg | SUBCUTANEOUS | Status: DC
  Administered 2019-03-22 – 2019-03-24 (×3): 40 mg via SUBCUTANEOUS
  Filled 2019-03-22 (×3): qty 0.4

## 2019-03-22 MED ORDER — FENTANYL CITRATE (PF) 100 MCG/2ML IJ SOLN
INTRAMUSCULAR | Status: DC | PRN
  Administered 2019-03-22: 50 ug via INTRAVENOUS
  Administered 2019-03-22: 200 ug via INTRAVENOUS

## 2019-03-22 MED ORDER — OXYCODONE HCL 5 MG/5ML PO SOLN
10.0000 mg | ORAL | Status: DC | PRN
  Administered 2019-03-22 – 2019-03-25 (×6): 10 mg via ORAL
  Filled 2019-03-22 (×7): qty 10

## 2019-03-22 MED ORDER — HYDROCODONE-ACETAMINOPHEN 7.5-325 MG PO TABS: 1.0000 | ORAL_TABLET | Freq: Once | ORAL | Status: DC | PRN

## 2019-03-22 MED ORDER — MIDAZOLAM HCL 2 MG/2ML IJ SOLN
INTRAMUSCULAR | Status: DC | PRN
  Administered 2019-03-22: 2 mg via INTRAVENOUS

## 2019-03-22 MED ORDER — PROPOFOL 10 MG/ML IV BOLUS
INTRAVENOUS | Status: AC
  Filled 2019-03-22: qty 20

## 2019-03-22 MED ORDER — ACETAMINOPHEN 500 MG PO TABS: 1000.0000 mg | ORAL_TABLET | ORAL | Status: DC

## 2019-03-22 MED ORDER — CELECOXIB 200 MG PO CAPS: 200.0000 mg | ORAL_CAPSULE | ORAL | Status: DC

## 2019-03-22 MED ORDER — ONDANSETRON HCL 4 MG/2ML IJ SOLN
INTRAMUSCULAR | Status: AC
  Filled 2019-03-22: qty 2

## 2019-03-22 MED ORDER — EPHEDRINE SULFATE 50 MG/ML IJ SOLN
INTRAMUSCULAR | Status: DC | PRN
  Administered 2019-03-22: 15 mg via INTRAVENOUS

## 2019-03-22 MED ORDER — FENTANYL CITRATE (PF) 250 MCG/5ML IJ SOLN
INTRAMUSCULAR | Status: AC
  Filled 2019-03-22: qty 5

## 2019-03-22 MED ORDER — BUPIVACAINE LIPOSOME 1.3 % IJ SUSP
INTRAMUSCULAR | Status: AC
  Filled 2019-03-22: qty 20

## 2019-03-22 MED ORDER — ROCURONIUM BROMIDE 100 MG/10ML IV SOLN
INTRAVENOUS | Status: DC | PRN
  Administered 2019-03-22: 20 mg via INTRAVENOUS
  Administered 2019-03-22: 30 mg via INTRAVENOUS
  Administered 2019-03-22 (×2): 20 mg via INTRAVENOUS
  Administered 2019-03-22: 50 mg via INTRAVENOUS

## 2019-03-22 MED ORDER — ONDANSETRON HCL 4 MG/2ML IJ SOLN: 4.0000 mg | Freq: Four times a day (QID) | INTRAMUSCULAR | Status: DC | PRN

## 2019-03-22 MED ORDER — HYDROMORPHONE HCL 1 MG/ML IJ SOLN
INTRAMUSCULAR | Status: AC
  Administered 2019-03-22: 0.5 mg via INTRAVENOUS
  Filled 2019-03-22: qty 1

## 2019-03-22 MED ORDER — ACETAMINOPHEN 160 MG/5ML PO SOLN
1000.0000 mg | Freq: Three times a day (TID) | ORAL | Status: DC
  Administered 2019-03-22 – 2019-03-25 (×9): 1000 mg via ORAL
  Filled 2019-03-22 (×12): qty 40.6

## 2019-03-22 MED ORDER — OXYCODONE HCL 5 MG/5ML PO SOLN: 10.0000 mg | ORAL | Status: DC | PRN

## 2019-03-22 MED ORDER — HYDROMORPHONE HCL 1 MG/ML IJ SOLN
1.0000 mg | INTRAMUSCULAR | Status: DC | PRN
  Administered 2019-03-22 – 2019-03-25 (×12): 1 mg via INTRAVENOUS
  Filled 2019-03-22 (×13): qty 1

## 2019-03-22 MED ORDER — SODIUM CHLORIDE 0.9 % IV SOLN: INTRAVENOUS | Status: DC | PRN

## 2019-03-22 MED ORDER — LIDOCAINE HCL (CARDIAC) PF 100 MG/5ML IV SOSY
PREFILLED_SYRINGE | INTRAVENOUS | Status: DC | PRN
  Administered 2019-03-22: 100 mg via INTRAVENOUS

## 2019-03-22 MED ORDER — ACETAMINOPHEN 500 MG PO TABS
ORAL_TABLET | ORAL | Status: AC
  Filled 2019-03-22: qty 2

## 2019-03-22 MED ORDER — METHOCARBAMOL 500 MG PO TABS
500.0000 mg | ORAL_TABLET | Freq: Four times a day (QID) | ORAL | Status: DC | PRN
  Filled 2019-03-22: qty 1

## 2019-03-22 MED ORDER — PROPOFOL 10 MG/ML IV BOLUS
INTRAVENOUS | Status: DC | PRN
  Administered 2019-03-22: 200 mg via INTRAVENOUS

## 2019-03-22 MED ORDER — DEXTROSE IN LACTATED RINGERS 5 % IV SOLN
INTRAVENOUS | Status: DC
  Administered 2019-03-22 – 2019-03-25 (×6): via INTRAVENOUS

## 2019-03-22 MED ORDER — MEPERIDINE HCL 50 MG/ML IJ SOLN
INTRAMUSCULAR | Status: AC
  Administered 2019-03-22: 13:00:00 12.5 mg via INTRAVENOUS
  Filled 2019-03-22: qty 1

## 2019-03-22 MED ORDER — HYDROMORPHONE HCL 1 MG/ML IJ SOLN
INTRAMUSCULAR | Status: AC
  Administered 2019-03-22: 12:00:00 0.5 mg via INTRAVENOUS
  Filled 2019-03-22: qty 1

## 2019-03-22 MED ORDER — MIDAZOLAM HCL 2 MG/2ML IJ SOLN
INTRAMUSCULAR | Status: AC
  Filled 2019-03-22: qty 2

## 2019-03-22 MED ORDER — DEXAMETHASONE SODIUM PHOSPHATE 10 MG/ML IJ SOLN
INTRAMUSCULAR | Status: DC | PRN
  Administered 2019-03-22: 10 mg via INTRAVENOUS

## 2019-03-22 MED ORDER — ZOLPIDEM TARTRATE 5 MG PO TABS
5.0000 mg | ORAL_TABLET | Freq: Every evening | ORAL | Status: DC | PRN
  Filled 2019-03-22 (×3): qty 1

## 2019-03-22 MED ORDER — BUPIVACAINE LIPOSOME 1.3 % IJ SUSP: 20.0000 mL | Freq: Once | INTRAMUSCULAR | Status: DC

## 2019-03-22 MED ORDER — KETOROLAC TROMETHAMINE 30 MG/ML IJ SOLN
30.0000 mg | Freq: Four times a day (QID) | INTRAMUSCULAR | Status: DC
  Administered 2019-03-22 – 2019-03-25 (×12): 30 mg via INTRAVENOUS
  Filled 2019-03-22 (×13): qty 1

## 2019-03-22 MED ORDER — ACETAMINOPHEN 325 MG PO TABS: 325.0000 mg | ORAL_TABLET | ORAL | Status: DC | PRN

## 2019-03-22 MED ORDER — KETAMINE HCL 50 MG/ML IJ SOLN
INTRAMUSCULAR | Status: AC
  Filled 2019-03-22: qty 10

## 2019-03-22 MED ORDER — KETOROLAC TROMETHAMINE 30 MG/ML IJ SOLN
30.0000 mg | Freq: Once | INTRAMUSCULAR | Status: AC
  Administered 2019-03-22: 13:00:00 30 mg via INTRAVENOUS

## 2019-03-22 MED ORDER — KETOROLAC TROMETHAMINE 30 MG/ML IJ SOLN
INTRAMUSCULAR | Status: AC
  Administered 2019-03-22: 30 mg via INTRAVENOUS
  Filled 2019-03-22: qty 1

## 2019-03-22 MED ORDER — ONDANSETRON 4 MG PO TBDP: 4.0000 mg | ORAL_TABLET | Freq: Four times a day (QID) | ORAL | Status: DC | PRN

## 2019-03-22 MED ORDER — BUPIVACAINE-EPINEPHRINE 0.25% -1:200000 IJ SOLN
INTRAMUSCULAR | Status: DC | PRN
  Administered 2019-03-22: 30 mL

## 2019-03-22 MED ORDER — DEXMEDETOMIDINE HCL 200 MCG/2ML IV SOLN
INTRAVENOUS | Status: DC | PRN
  Administered 2019-03-22 (×2): 16 ug via INTRAVENOUS

## 2019-03-22 MED ORDER — KETAMINE HCL 50 MG/ML IJ SOLN
INTRAMUSCULAR | Status: DC | PRN
  Administered 2019-03-22: 50 mg via INTRAMUSCULAR

## 2019-03-22 MED ORDER — HYDROMORPHONE HCL 1 MG/ML IJ SOLN
INTRAMUSCULAR | Status: DC | PRN
  Administered 2019-03-22: .4 mg via INTRAVENOUS
  Administered 2019-03-22: .6 mg via INTRAVENOUS
  Administered 2019-03-22: .4 mg via INTRAVENOUS
  Administered 2019-03-22: .6 mg via INTRAVENOUS

## 2019-03-22 MED ORDER — HYDROMORPHONE HCL 1 MG/ML IJ SOLN
INTRAMUSCULAR | Status: AC
  Filled 2019-03-22: qty 1

## 2019-03-22 MED ORDER — ACETAMINOPHEN 160 MG/5ML PO SOLN
325.0000 mg | ORAL | Status: DC | PRN
  Filled 2019-03-22: qty 20.3

## 2019-03-22 MED ORDER — GABAPENTIN 300 MG PO CAPS
ORAL_CAPSULE | ORAL | Status: AC
  Filled 2019-03-22: qty 1

## 2019-03-22 MED ORDER — ACETAMINOPHEN 10 MG/ML IV SOLN
INTRAVENOUS | Status: AC
  Filled 2019-03-22: qty 100

## 2019-03-22 MED ORDER — GLYCOPYRROLATE 0.2 MG/ML IJ SOLN
INTRAMUSCULAR | Status: DC | PRN
  Administered 2019-03-22 (×2): 0.2 mg via INTRAVENOUS

## 2019-03-22 MED ORDER — FAMOTIDINE 20 MG PO TABS
ORAL_TABLET | ORAL | Status: AC
  Filled 2019-03-22: qty 1

## 2019-03-22 MED ORDER — SODIUM CHLORIDE 0.9 % IV SOLN
INTRAVENOUS | Status: DC | PRN
  Administered 2019-03-22: 70 mL

## 2019-03-22 MED ORDER — PHENYLEPHRINE HCL (PRESSORS) 10 MG/ML IV SOLN
INTRAVENOUS | Status: DC | PRN
  Administered 2019-03-22: 100 ug via INTRAVENOUS

## 2019-03-22 MED ORDER — GABAPENTIN 300 MG PO CAPS: 300.0000 mg | ORAL_CAPSULE | ORAL | Status: DC

## 2019-03-22 MED ORDER — DEXMEDETOMIDINE HCL 200 MCG/2ML IV SOLN: INTRAVENOUS | Status: DC | PRN

## 2019-03-22 MED ORDER — SUGAMMADEX SODIUM 500 MG/5ML IV SOLN
INTRAVENOUS | Status: DC | PRN
  Administered 2019-03-22: 200 mg via INTRAVENOUS

## 2019-03-22 MED ORDER — IBUPROFEN 100 MG/5ML PO SUSP
800.0000 mg | Freq: Four times a day (QID) | ORAL | Status: DC | PRN
  Administered 2019-03-24: 800 mg via ORAL
  Filled 2019-03-22 (×3): qty 40

## 2019-03-22 MED ORDER — HYDROMORPHONE HCL 1 MG/ML IJ SOLN
0.2500 mg | INTRAMUSCULAR | Status: DC | PRN
  Administered 2019-03-22 (×4): 0.5 mg via INTRAVENOUS

## 2019-03-22 MED ORDER — ONDANSETRON HCL 4 MG/2ML IJ SOLN
INTRAMUSCULAR | Status: DC | PRN
  Administered 2019-03-22: 4 mg via INTRAVENOUS

## 2019-03-22 MED ORDER — MEPERIDINE HCL 50 MG/ML IJ SOLN
6.2500 mg | INTRAMUSCULAR | Status: DC | PRN
  Administered 2019-03-22 (×2): 12.5 mg via INTRAVENOUS

## 2019-03-22 MED ORDER — ACETAMINOPHEN 10 MG/ML IV SOLN
INTRAVENOUS | Status: DC | PRN
  Administered 2019-03-22: 1000 mg via INTRAVENOUS

## 2019-03-22 MED ORDER — FAMOTIDINE 20 MG PO TABS: 20.0000 mg | ORAL_TABLET | Freq: Once | ORAL | Status: DC

## 2019-03-22 MED ORDER — CELECOXIB 200 MG PO CAPS
ORAL_CAPSULE | ORAL | Status: AC
  Filled 2019-03-22: qty 1

## 2019-03-22 MED ORDER — HYDROMORPHONE HCL 1 MG/ML IJ SOLN
0.5000 mg | INTRAMUSCULAR | Status: DC | PRN
  Administered 2019-03-22: 13:00:00 0.5 mg via INTRAVENOUS

## 2019-03-22 MED ORDER — PROMETHAZINE HCL 25 MG/ML IJ SOLN: 6.2500 mg | INTRAMUSCULAR | Status: DC | PRN

## 2019-03-22 MED ORDER — LACTATED RINGERS IV SOLN
INTRAVENOUS | Status: DC | PRN
  Administered 2019-03-22: 08:00:00 via INTRAVENOUS

## 2019-03-22 MED ORDER — SODIUM CHLORIDE (PF) 0.9 % IJ SOLN
INTRAMUSCULAR | Status: AC
  Filled 2019-03-22: qty 50

## 2019-03-22 MED ORDER — LACTATED RINGERS IV SOLN
INTRAVENOUS | Status: DC
  Administered 2019-03-22: 07:00:00 via INTRAVENOUS

## 2019-03-22 MED ORDER — BUPIVACAINE-EPINEPHRINE (PF) 0.25% -1:200000 IJ SOLN
INTRAMUSCULAR | Status: AC
  Filled 2019-03-22: qty 30

## 2019-03-22 SURGICAL SUPPLY — 58 items
APPLIER CLIP 11 MED OPEN (CLIP)
APPLIER CLIP 13 LRG OPEN (CLIP)
BLADE CLIPPER SURG (BLADE) ×2 IMPLANT
BLADE SURG 15 STRL LF DISP TIS (BLADE) IMPLANT
BLADE SURG 15 STRL SS (BLADE) ×3
BNDG CONFORM 2 STRL LF (GAUZE/BANDAGES/DRESSINGS) ×4 IMPLANT
BRUSH SCRUB EZ  4% CHG (MISCELLANEOUS) ×1
BRUSH SCRUB EZ 4% CHG (MISCELLANEOUS) ×1 IMPLANT
CATH URET ROBINSON 16FR STRL (CATHETERS) ×2 IMPLANT
CHLORAPREP W/TINT 26 (MISCELLANEOUS) ×2 IMPLANT
CLIP APPLIE 11 MED OPEN (CLIP) IMPLANT
CLIP APPLIE 13 LRG OPEN (CLIP) IMPLANT
CNTNR SPEC 2.5X3XGRAD LEK (MISCELLANEOUS) ×1
CONT SPEC 4OZ STER OR WHT (MISCELLANEOUS) ×1
CONTAINER SPEC 2.5X3XGRAD LEK (MISCELLANEOUS) IMPLANT
COVER WAND RF STERILE (DRAPES) ×2 IMPLANT
DRAPE LAPAROTOMY 100X77 ABD (DRAPES) ×2 IMPLANT
DRAPE SHEET LG 3/4 BI-LAMINATE (DRAPES) ×2 IMPLANT
DRSG OPSITE POSTOP 4X6 (GAUZE/BANDAGES/DRESSINGS) ×2 IMPLANT
DRSG TELFA 3X8 NADH (GAUZE/BANDAGES/DRESSINGS) ×2 IMPLANT
DRSG TELFA 4X14 ISLAND NADH (GAUZE/BANDAGES/DRESSINGS) ×1 IMPLANT
DRSG TELFA 4X3 1S NADH ST (GAUZE/BANDAGES/DRESSINGS) ×2 IMPLANT
ELECT BLADE 6.5 EXT (BLADE) ×2 IMPLANT
ELECT CAUTERY BLADE 6.4 (BLADE) ×2 IMPLANT
ELECT REM PT RETURN 9FT ADLT (ELECTROSURGICAL) ×2
ELECTRODE REM PT RTRN 9FT ADLT (ELECTROSURGICAL) ×1 IMPLANT
GAUZE SPONGE 4X4 12PLY STRL (GAUZE/BANDAGES/DRESSINGS) ×2 IMPLANT
GLOVE BIO SURGEON STRL SZ7 (GLOVE) ×5 IMPLANT
GOWN STRL REUS W/ TWL LRG LVL3 (GOWN DISPOSABLE) ×4 IMPLANT
GOWN STRL REUS W/TWL LRG LVL3 (GOWN DISPOSABLE) ×4
HANDLE SUCTION POOLE (INSTRUMENTS) ×1 IMPLANT
HANDLE YANKAUER SUCT BULB TIP (MISCELLANEOUS) ×1 IMPLANT
LIGASURE IMPACT 36 18CM CVD LR (INSTRUMENTS) ×2 IMPLANT
NEEDLE HYPO 22GX1.5 SAFETY (NEEDLE) ×3 IMPLANT
NS IRRIG 1000ML POUR BTL (IV SOLUTION) ×4 IMPLANT
PACK BASIN MAJOR ARMC (MISCELLANEOUS) ×2 IMPLANT
PACK COLON CLEAN CLOSURE (MISCELLANEOUS) ×1 IMPLANT
PAD DRESSING TELFA 3X8 NADH (GAUZE/BANDAGES/DRESSINGS) ×1 IMPLANT
PLEDGET CV PTFE 7X3 (MISCELLANEOUS) IMPLANT
RELOAD PROXIMATE 75MM BLUE (ENDOMECHANICALS) IMPLANT
RELOAD STAPLE 75 3.8 BLU REG (ENDOMECHANICALS) IMPLANT
SPONGE KITTNER 5P (MISCELLANEOUS) ×2 IMPLANT
SPONGE LAP 18X18 RF (DISPOSABLE) ×3 IMPLANT
SPONGE LAP 18X36 RFD (DISPOSABLE) ×1 IMPLANT
STAPLER PROX 25M (MISCELLANEOUS) ×1 IMPLANT
STAPLER PROXIMATE 75MM BLUE (STAPLE) ×1 IMPLANT
STAPLER SKIN PROX 35W (STAPLE) ×3 IMPLANT
SUCTION POOLE HANDLE (INSTRUMENTS) ×2
SUT PDS AB 0 CT1 27 (SUTURE) ×1 IMPLANT
SUT PDS AB 1 TP1 96 (SUTURE) ×3 IMPLANT
SUT PDS PLUS 0 (SUTURE) ×3
SUT PDS PLUS AB 0 CT-2 (SUTURE) ×4 IMPLANT
SUT SILK 2 0 SH CR/8 (SUTURE) ×2 IMPLANT
SUT VIC AB 2-0 CT1 (SUTURE) ×1 IMPLANT
SYR 20ML LL LF (SYRINGE) ×4 IMPLANT
SYRINGE IRR TOOMEY STRL 70CC (SYRINGE) ×2 IMPLANT
TRAY FOLEY MTR SLVR 16FR STAT (SET/KITS/TRAYS/PACK) ×2 IMPLANT
TUBING CONNECTING 10 (TUBING) ×1 IMPLANT

## 2019-03-22 NOTE — Transfer of Care (Signed)
Immediate Anesthesia Transfer of Care Note  Patient: Timothy [name]  Procedure(s) Performed: COLOSTOMY REVERSAL (N/A )  Patient Location: PACU  Anesthesia Type:General  Level of Consciousness: sedated  Airway & Oxygen Therapy: Patient Spontanous Breathing and Patient connected to face mask oxygen  Post-op Assessment: Report given to RN and Post -op Vital signs reviewed and stable  Post vital signs: Reviewed and stable  Last Vitals:  Vitals Value Taken Time  BP 115/61 03/22/19 1201  Temp 36.8 C 03/22/19 1200  Pulse 74 03/22/19 1201  Resp 13 03/22/19 1201  SpO2 98 % 03/22/19 1201  Vitals shown include unvalidated device data.  Last Pain:  Vitals:   03/22/19 1200  TempSrc:   PainSc: 10-Worst pain ever         Complications: No apparent anesthesia complications

## 2019-03-22 NOTE — Anesthesia Post-op Follow-up Note (Signed)
Anesthesia QCDR form completed.        

## 2019-03-22 NOTE — Anesthesia Preprocedure Evaluation (Signed)
Anesthesia Evaluation  Patient identified by MRN, date of birth, ID band Patient awake    Reviewed: Allergy & Precautions, H&P , NPO status , reviewed documented beta blocker date and time   Airway Mallampati: II  TM Distance: >3 FB Neck ROM: full    Dental  (+) Teeth Intact   Pulmonary shortness of breath, Current Smoker,    Pulmonary exam normal        Cardiovascular Normal cardiovascular exam     Neuro/Psych PSYCHIATRIC DISORDERS Anxiety Depression    GI/Hepatic   Endo/Other    Renal/GU      Musculoskeletal   Abdominal   Peds  Hematology   Anesthesia Other Findings Past Medical History: No date: Allergy No date: Depression No date: Diverticulitis No date: Dyspnea No date: Fainting spell     Comment:  in middle school No date: Family history of bone cancer No date: Family history of breast cancer Past Surgical History: 07/22/2018: COLECTOMY WITH COLOSTOMY CREATION/HARTMANN PROCEDURE; N/A     Comment:  Procedure: COLECTOMY WITH COLOSTOMY CREATION/HARTMANN               PROCEDURE;  Surgeon: Fredirick Maudlin, MD;  Location:               [facility] ORS;  Service: General;  Laterality: N/A; 11/01/2018: COLONOSCOPY WITH PROPOFOL; N/A     Comment:  Procedure: COLONOSCOPY WITH PROPOFOL;  Surgeon: [name], MD;  Location: [facility] ENDOSCOPY;  Service:               Gastroenterology;  Laterality: N/A; BMI    Body Mass Index: 33.91 kg/m     Reproductive/Obstetrics                             Anesthesia Physical Anesthesia Plan  ASA: II  Anesthesia Plan: General   Post-op Pain Management:    Induction: Intravenous  PONV Risk Score and Plan: Ondansetron and Treatment may vary due to age or medical condition  Airway Management Planned: Oral ETT  Additional Equipment:   Intra-op Plan:   Post-operative Plan: Extubation in OR  Informed Consent: I have reviewed the  patients History and Physical, chart, labs and discussed the procedure including the risks, benefits and alternatives for the proposed anesthesia with the patient or authorized representative who has indicated his/her understanding and acceptance.     Dental Advisory Given  Plan Discussed with: CRNA  Anesthesia Plan Comments:         Anesthesia Quick Evaluation

## 2019-03-22 NOTE — Progress Notes (Signed)
Patient's mother called to get an update. RN gave the mother an update and she stated she would like to get a call every shift for an update on the patient.

## 2019-03-22 NOTE — Op Note (Signed)
Operative Note  Preoperative Diagnosis: History of perforated diverticulitis, status post Hartmann procedure, ostomy in place.  Postoperative Diagnosis: Same  Operation: Reversal of ostomy and reanastomosis of colon  Surgeon: Fredirick Maudlin, MD  Assistant: Caroleen Hamman, MD (a second surgeon was required secondary to the complexity of the case, need for exposure, anastomosis, and lack of other qualified assistants)  Anesthesia: General endotracheal  Findings: Extensive, dense scar tissue, which greatly extended the duration of the case due to the need for adhesiolysis.  Anastomosis with complete donuts and no leak on bubble test.  Indications: Timothy [name] is a 23 year old man who presented to the emergency department with abdominal pain.  He was found to have perforated diverticulitis.  He underwent a Hartmann procedure in November 2019.  He is here for reversal of his ostomy and reanastomosis of his colon.  Procedure In Detail: The patient was identified in the preoperative holding area and brought to the operating room where he was placed supine on the OR table.  Bony prominences were padded and bilateral sequential compression devices were placed on the lower extremities.  General endotracheal anesthesia was induced without incident.  The patient was then positioned in the lithotomy position.  Once again, care was taken to appropriately pad all bony prominences.  A Foley catheter was aseptically placed.  The ostomy was closed with a pursestring silk suture and covered with a Tegaderm.  He was then sterilely prepped and draped in standard fashion.  A timeout was performed confirming his identity, the procedure being performed, his allergies, all necessary equipment was available, and that maintenance anesthesia was adequate.  A perioperative dose of antibiotics was administered.  The patient had a hypertrophic midline scar.  This was excised as we made our incision.  The scar and subcutaneous  tissue were handed off.  We continued our dissection down to the fascia.  The fascia was sharply incised.  As we began opening the fascia, we noted that the bowel was densely adherent to the underside of the incision.  A small enterotomy was made but there was no gross soilage.  It was closed with a figure-of-eight Lembert silk suture.  We continued to dissect through extensive scar tissue.  This took approximately 2 hours to fully free up the bowel and release the stoma from the surrounding fascia.  A second enterotomy was made during this process and once again it was closed with interrupted Lembert silk sutures.  We continued our dissection and down into the pelvis, once the bowel had been freed from the abdominal wall.  We identified the ureter and found the rectal stump, marked by Prolene sutures.  The rectal stump was circumferentially freed the surrounding pelvic tissue.  We found that there was adequate length on the section of bowel used to create the ostomy, such that reached to the pelvis without any tension.  We elected to trim a fair amount of the fat off of the colon.  The stoma and was then stapled off with a blue load GIA stapler.  We opened the bowel and inserted dilators.  It accepted a 25 without difficulty, but would not admit anything larger.  A 25 mm EEA stapler was brought to the field.  The anvil was placed within the free end of the colon and secured with a pursestring suture.  I then went below while Dr. Perrin Maltese remained above.  The EEA stapler was then used to create a tension-free anastomosis in the pelvis.  The donuts were examined and  were intact.  The pelvis was then filled with saline and air injected via the rectum.  No leak was seen.  We then irrigated the abdomen thoroughly.  The fascia of the stoma was closed with running #1 PDS.    We then changed to a clean closure tray and changed our gowns and gloves.  The ostomy site was isolated away from the remainder of the surgical  field.  The vertical midline incision was closed with running #1 looped PDS.  Deep dermal Vicryl stitches were placed in the midline incision.  The skin was reapproximated with staples.  A sterile dressing was applied.  We then returned to the stoma site.  I elected to place 3 Telfa wicks and closed the skin loosely between each week.  The patient was then awakened, extubated, and taken to the postanesthesia care unit in good condition.  EBL: 100 cc  IVF: 2 L of crystalloid  Specimen(s): None  Complications: none immediately apparent.   Counts: all needles, instruments, and sponges were counted and reported to be correct in number at the end of the case.   I was present for and participated in the entire operation.  Fredirick Maudlin  12:11 PM

## 2019-03-22 NOTE — Anesthesia Procedure Notes (Signed)
Procedure Name: Intubation Date/Time: 03/22/2019 7:41 AM Performed by: Justus Memory, CRNA Pre-anesthesia Checklist: Patient identified, Patient being monitored, Timeout performed, Emergency Drugs available and Suction available Patient Re-evaluated:Patient Re-evaluated prior to induction Oxygen Delivery Method: Circle system utilized Preoxygenation: Pre-oxygenation with 100% oxygen Induction Type: IV induction Ventilation: Mask ventilation without difficulty Laryngoscope Size: Mac and 3 Grade View: Grade I Tube type: Oral Tube size: 7.0 mm Number of attempts: 1 Airway Equipment and Method: Stylet Placement Confirmation: ETT inserted through vocal cords under direct vision,  positive ETCO2 and breath sounds checked- equal and bilateral Secured at: 22 cm Tube secured with: Tape Dental Injury: Teeth and Oropharynx as per pre-operative assessment

## 2019-03-22 NOTE — OR Nursing (Signed)
Patient states he is unable to swallow any tablet/capsule form of medication.  Dr. Celine Ahr aware.

## 2019-03-22 NOTE — H&P (Signed)
Timothy [name] is a 23 y.o. male.   HPI he presented to the emergency department at [facility] in November 2020.  He had perforated diverticulitis.  He underwent a Hartmann procedure.  He is here today to discuss reversal of his ostomy.    He states that he has gotten more comfortable with managing his ostomy.  He was advised to use fiber supplements to keep his stools at a thick mashed potato consistency.  He says that he felt like he did not need the fiber supplements but sometimes has hard stools intermittently.  He says that when he drinks more water, his stool is more mushy.  He denies any fever, chills, nausea, or vomiting.  He is not experiencing any pain.  He did undergo colonoscopy with Dr. Vicente Males.  A tubular adenoma was removed from his transverse colon.  Was negative for high-grade dysplasia or malignancy.  He was seen in genetic counseling on April 23.  He declined genetic testing at that time.      Past Medical History:  Diagnosis Date  . Allergy   . Depression   . Diverticulitis   . Fainting spell    in middle school  . Family history of bone cancer   . Family history of breast cancer          Past Surgical History:  Procedure Laterality Date  . COLECTOMY WITH COLOSTOMY CREATION/HARTMANN PROCEDURE N/A 07/22/2018   Procedure: COLECTOMY WITH COLOSTOMY CREATION/HARTMANN PROCEDURE;  Surgeon: Fredirick Maudlin, MD;  Location: [facility] ORS;  Service: General;  Laterality: N/A;  . COLONOSCOPY WITH PROPOFOL N/A 11/01/2018   Procedure: COLONOSCOPY WITH PROPOFOL;  Surgeon: [name], MD;  Location: [facility] ENDOSCOPY;  Service: Gastroenterology;  Laterality: N/A;         Family History  Problem Relation Age of Onset  . Heart disease Maternal Grandmother   . Diabetes Maternal Grandmother   . Cancer Maternal Grandmother        breast cancer  . Alport syndrome Maternal Grandmother   . Cancer Paternal Grandfather        Bone cancer  . Healthy Sister   . Alport syndrome  Maternal Aunt   . Depression Mother   . Cervical cancer Mother     Social History Social History        Tobacco Use  . Smoking status: Former Smoker    Packs/day: 0.25    Types: Cigarettes    Last attempt to quit: 01/30/2019  . Smokeless tobacco: Never Used  . Tobacco comment: pt smoke pack a week  Substance Use Topics  . Alcohol use: No    Alcohol/week: 0.0 standard drinks    Comment: occassionally  . Drug use: No    No Known Allergies        Current Outpatient Medications  Medication Sig Dispense Refill  . ibuprofen (ADVIL,MOTRIN) 600 MG tablet Take 1 tablet (600 mg total) by mouth every 8 (eight) hours as needed for fever, mild pain or moderate pain. 30 tablet 0  . bisacodyl (DULCOLAX) 5 MG EC tablet Take 1 tablet (5 mg total) by mouth once for 1 dose. 4 tablet 0  . erythromycin base (E-MYCIN) 500 MG tablet Take 2 tablets (1,000 mg total) by mouth 3 (three) times daily. 6 tablet 0  . neomycin (MYCIFRADIN) 500 MG tablet Take 2 tablets (1,000 mg total) by mouth 3 (three) times daily. 6 tablet 0  . polyethylene glycol powder (GLYCOLAX/MIRALAX) 17 GM/SCOOP powder Take 255 g by mouth once for  1 dose. 255 g 0   No current facility-administered medications for this visit.     Review of Systems Review of Systems  All other systems reviewed and are negative.   Vitals:   03/22/19 0628  BP: 126/69  Pulse: 87  Resp: 16  Temp: 97.6 F (36.4 C)  SpO2: 99%     Physical Exam Physical Exam Constitutional:      General: He is not in acute distress.    Appearance: Normal appearance. He is obese.  HENT:     Head: Normocephalic and atraumatic.  Eyes:     General: No scleral icterus.    Pupils: Pupils are equal, round, and reactive to light.  Neck:     Musculoskeletal: Normal range of motion.  Cardiovascular:     Rate and Rhythm: Normal rate and regular rhythm.     Heart sounds: No murmur.  Pulmonary:     Effort: Pulmonary effort is normal.      Breath sounds: Normal breath sounds.  Abdominal:     General: Bowel sounds are normal.     Palpations: Abdomen is soft.     Comments: Hypertrophic scar from previous surgery and drain.  Left-sided ostomy is pink. Genitourinary:    Comments: Deferred Musculoskeletal: Normal range of motion.        General: No swelling.  Skin:    General: Skin is warm and dry.  Neurological:     General: No focal deficit present.     Mental Status: He is alert.  Psychiatric:     Comments: Anxious, as is his baseline.     Data Reviewed I reviewed the report of his colonoscopy, performed by Dr. Vicente Males.  This describes a tubular adenoma in the transverse colon measuring 15 millimeters, removed with a hot snare.  The pathology report was also reviewed demonstrating no high-grade dysplasia or malignancy.  I read the note from oncology genetics counseling from 23 April, in which he declined further genetic testing.  Assessment This is a 23 year old male who underwent a Hartmann procedure for perforated diverticulitis in November 2019.  Today, we discussed reversal of his ostomy.  He had a number of questions regarding this, including whether or not the presence of the adenoma that was removed would preclude him from reversal.  He was reassured that this was not a contraindication, food recommendations from the colonoscopy were discussed with him, specifically that he needs to have another scope in 3 years.  Plan Scheduled for reversal of his ostomy and reanastomosis today.  He was unable to take the prescribed oral antibiotics as part of his prep, secondary to inability to swallow pills.  He also admitted to eating a chocolate chip cookie yesterday.  He was cautioned that if there was significant stool found on exploration, that the operation would be aborted and need to be performed another time.    Fredirick Maudlin

## 2019-03-23 LAB — BASIC METABOLIC PANEL
Anion gap: 8 (ref 5–15)
BUN: 9 mg/dL (ref 6–20)
CO2: 25 mmol/L (ref 22–32)
Calcium: 8.2 mg/dL — ABNORMAL LOW (ref 8.9–10.3)
Chloride: 104 mmol/L (ref 98–111)
Creatinine, Ser: 0.89 mg/dL (ref 0.61–1.24)
GFR calc Af Amer: >60 mL/min (ref >60)
GFR calc non Af Amer: >60 mL/min (ref >60)
Glucose, Bld: 130 mg/dL — ABNORMAL HIGH (ref 70–99)
Potassium: 3.6 mmol/L (ref 3.5–5.1)
Sodium: 137 mmol/L (ref 135–145)

## 2019-03-23 LAB — PHOSPHORUS: Phosphorus: 3.7 mg/dL (ref 2.5–4.6)

## 2019-03-23 LAB — MAGNESIUM: Magnesium: 1.9 mg/dL (ref 1.7–2.4)

## 2019-03-23 LAB — CBC
HCT: 42.9 % (ref 39.0–52.0)
Hemoglobin: 14.3 g/dL (ref 13.0–17.0)
MCH: 30.1 pg (ref 26.0–34.0)
MCHC: 33.3 g/dL (ref 30.0–36.0)
MCV: 90.3 fL (ref 80.0–100.0)
Platelets: 195 10*3/uL (ref 150–400)
RBC: 4.75 MIL/uL (ref 4.22–5.81)
RDW: 13.2 % (ref 11.5–15.5)
WBC: 15.4 10*3/uL — ABNORMAL HIGH (ref 4.0–10.5)
nRBC: 0 % (ref 0.0–0.2)

## 2019-03-23 NOTE — Progress Notes (Signed)
Subjective:  CC: Timothy [name] is a 23 y.o. male  Hospital stay day 1, 1 Day Post-Op Hartmann's reversal  HPI: Patient reported episode of bleeding from the midline wound after ambulating last night.  No report of flatus or bowel movements.  Otherwise no other specific complaints.  ROS:  General: Denies weight loss, weight gain, fatigue, fevers, chills, and night sweats. Heart: Denies chest pain, palpitations, racing heart, irregular heartbeat, leg pain or swelling, and decreased activity tolerance. Respiratory: Denies breathing difficulty, shortness of breath, wheezing, cough, and sputum. GI: Denies change in appetite, heartburn, nausea, vomiting, constipation, diarrhea, and blood in stool. GU: Denies difficulty urinating, pain with urinating, urgency, frequency, blood in urine.   Objective:   Temp:  [97.8 F (36.6 C)-99 F (37.2 C)] 98.6 F (37 C) (07/25 0452) Pulse Rate:  [56-81] 79 (07/25 0452) Resp:  [16-20] 20 (07/25 0452) BP: (121-137)/(61-70) 129/70 (07/25 0452) SpO2:  [94 %-96 %] 94 % (07/25 0452)     Height: 6' (182.9 cm) Weight: 113.4 kg BMI (Calculated): 33.9   Intake/Output this shift:   Intake/Output Summary (Last 24 hours) at 03/23/2019 1307 Last data filed at 03/23/2019 2694 Gross per 24 hour  Intake 2833.03 ml  Output 845 ml  Net 1988.03 ml    Constitutional :  alert, cooperative, appears stated age and no distress  Respiratory:  clear to auscultation bilaterally  Cardiovascular:  regular rate and rhythm  Gastrointestinal: Soft, no guarding, but still complains of diffuse tenderness worst along the incision site..   Skin: Cool and moist.  Midline incision staple line with evidence of recent bleeding, but no signs of active bleeding or forming hematoma deep to the staple line.  Ostomy site dressing still intact with no evidence of drainage.  Psychiatric: Normal affect, non-agitated, not confused       LABS:  CMP Latest Ref Rng & Units 03/23/2019 03/22/2019  02/26/2019  Glucose 70 - 99 mg/dL 130(H) - 106(H)  BUN 6 - 20 mg/dL 9 - 13  Creatinine 0.61 - 1.24 mg/dL 0.89 0.86 0.77  Sodium 135 - 145 mmol/L 137 - 139  Potassium 3.5 - 5.1 mmol/L 3.6 - 4.0  Chloride 98 - 111 mmol/L 104 - 106  CO2 22 - 32 mmol/L 25 - 24  Calcium 8.9 - 10.3 mg/dL 8.2(L) - 9.2  Total Protein 6.5 - 8.1 g/dL - - 7.4  Total Bilirubin 0.3 - 1.2 mg/dL - - 0.5  Alkaline Phos 38 - 126 U/L - - 78  AST 15 - 41 U/L - - 17  ALT 0 - 44 U/L - - 20   CBC Latest Ref Rng & Units 03/23/2019 03/22/2019 02/26/2019  WBC 4.0 - 10.5 K/uL 15.4(H) 26.1(H) 10.0  Hemoglobin 13.0 - 17.0 g/dL 14.3 15.9 15.0  Hematocrit 39.0 - 52.0 % 42.9 47.5 44.7  Platelets 150 - 400 K/uL 195 206 243    RADS: n/a Assessment:   S/p Hartmann's.  Patient still has diffuse tenderness on exam, but otherwise stable.  Bleeding from the staple lines likely superficial, no evidence of growing hematoma deep within the incision.  Due to the amount of tenderness present, will continue with clear liquids diet today.

## 2019-03-24 ENCOUNTER — Encounter: Payer: Self-pay | Admitting: General Surgery

## 2019-03-24 MED ORDER — LORAZEPAM 1 MG PO TABS: 1.0000 mg | ORAL_TABLET | Freq: Four times a day (QID) | ORAL | Status: DC | PRN

## 2019-03-24 MED ORDER — LORAZEPAM 2 MG/ML IJ SOLN
1.0000 mg | Freq: Four times a day (QID) | INTRAMUSCULAR | Status: DC | PRN
  Administered 2019-03-24 – 2019-03-25 (×4): 1 mg via INTRAVENOUS
  Filled 2019-03-24 (×4): qty 1

## 2019-03-24 MED ORDER — DIAZEPAM 5 MG/ML IJ SOLN
5.0000 mg | Freq: Once | INTRAMUSCULAR | Status: AC
  Administered 2019-03-24: 5 mg via INTRAVENOUS
  Filled 2019-03-24: qty 2

## 2019-03-24 NOTE — Progress Notes (Signed)
The patient requested two times to not have medications diluted in normal saline as he wanted to feel the rush of his pain medication. The nurse had educated the patient that the medication was diluted to prevent him from having a rush, burning sensation when medication was being administered (dilaudid, Toradol, ativan). The patient was informed that the medication would work either way even if he did not feel a rush feeling.

## 2019-03-24 NOTE — Progress Notes (Signed)
[place] Hospital Day(s): 2.   Post op day(s): 2 Days Post-Op.   Interval History: Patient seen and examined, no acute events or new complaints overnight. Patient reports he is very anxious.  He denies any problem with the wound or the abdomen but he is just complaining about his anxiety.  Denies any nausea or vomiting.  Denies passing gas.  Vital signs in last 24 hours: [min-max] current  Temp:  [98.1 F (36.7 C)-99.4 F (37.4 C)] 98.2 F (36.8 C) (07/26 0406) Pulse Rate:  [70-77] 76 (07/26 0406) Resp:  [16-18] 18 (07/26 0406) BP: (112-129)/(68-72) 129/68 (07/26 0406) SpO2:  [93 %-96 %] 93 % (07/26 0406)     Height: 6' (182.9 cm) Weight: 113.4 kg BMI (Calculated): 33.9   Physical Exam:  Constitutional: alert, cooperative and no distress  Respiratory: breathing non-labored at rest  Cardiovascular: regular rate and sinus rhythm  Gastrointestinal: soft, mild-tender, and non-distended. Wound with old blood but no erythema.   Labs:  CBC Latest Ref Rng & Units 03/23/2019 03/22/2019 02/26/2019  WBC 4.0 - 10.5 K/uL 15.4(H) 26.1(H) 10.0  Hemoglobin 13.0 - 17.0 g/dL 14.3 15.9 15.0  Hematocrit 39.0 - 52.0 % 42.9 47.5 44.7  Platelets 150 - 400 K/uL 195 206 243   CMP Latest Ref Rng & Units 03/23/2019 03/22/2019 02/26/2019  Glucose 70 - 99 mg/dL 130(H) - 106(H)  BUN 6 - 20 mg/dL 9 - 13  Creatinine 0.61 - 1.24 mg/dL 0.89 0.86 0.77  Sodium 135 - 145 mmol/L 137 - 139  Potassium 3.5 - 5.1 mmol/L 3.6 - 4.0  Chloride 98 - 111 mmol/L 104 - 106  CO2 22 - 32 mmol/L 25 - 24  Calcium 8.9 - 10.3 mg/dL 8.2(L) - 9.2  Total Protein 6.5 - 8.1 g/dL - - 7.4  Total Bilirubin 0.3 - 1.2 mg/dL - - 0.5  Alkaline Phos 38 - 126 U/L - - 78  AST 15 - 41 U/L - - 17  ALT 0 - 44 U/L - - 20    Imaging studies: No new pertinent imaging studies   Assessment/Plan:  23 y.o. male 2 Days Post-Op s/p colostomy takedown for history of Hartmann procedure, complicated by pertinent comorbidities including  anxiety.  Patient main problem today is his anxiety.  I will add some Ativan as needed to control the anxiety.  This may help him be more calm let him recover.  The pain is more controlled.  There is no nausea or vomiting.  Still not passing gas.  We will continue with current management.  Encourage patient to ambulate.  We will continue with DVT prophylaxis.  [name], MD

## 2019-03-25 LAB — BASIC METABOLIC PANEL
Anion gap: 5 (ref 5–15)
BUN: 6 mg/dL (ref 6–20)
CO2: 28 mmol/L (ref 22–32)
Calcium: 8.2 mg/dL — ABNORMAL LOW (ref 8.9–10.3)
Chloride: 107 mmol/L (ref 98–111)
Creatinine, Ser: 0.69 mg/dL (ref 0.61–1.24)
GFR calc Af Amer: >60 mL/min (ref >60)
GFR calc non Af Amer: >60 mL/min (ref >60)
Glucose, Bld: 99 mg/dL (ref 70–99)
Potassium: 3.5 mmol/L (ref 3.5–5.1)
Sodium: 140 mmol/L (ref 135–145)

## 2019-03-25 LAB — PHOSPHORUS: Phosphorus: 2.8 mg/dL (ref 2.5–4.6)

## 2019-03-25 LAB — CBC
HCT: 35.7 % — ABNORMAL LOW (ref 39.0–52.0)
Hemoglobin: 11.6 g/dL — ABNORMAL LOW (ref 13.0–17.0)
MCH: 29.4 pg (ref 26.0–34.0)
MCHC: 32.5 g/dL (ref 30.0–36.0)
MCV: 90.6 fL (ref 80.0–100.0)
Platelets: 171 10*3/uL (ref 150–400)
RBC: 3.94 MIL/uL — ABNORMAL LOW (ref 4.22–5.81)
RDW: 12.9 % (ref 11.5–15.5)
WBC: 11.1 10*3/uL — ABNORMAL HIGH (ref 4.0–10.5)
nRBC: 0 % (ref 0.0–0.2)

## 2019-03-25 LAB — MAGNESIUM: Magnesium: 1.8 mg/dL (ref 1.7–2.4)

## 2019-03-25 MED ORDER — OXYCODONE HCL 5 MG/5ML PO SOLN: 10.0000 mg | ORAL | 0 refills | Status: DC | PRN

## 2019-03-25 NOTE — Evaluation (Signed)
Physical Therapy Evaluation Patient Details Name: Timothy [name] MRN: [id number] DOB: 29-Apr-1996 Today's Date: 03/25/2019   History of Present Illness  Pt is admitted for sigmoid diverticulitis and is POD 3 s/p colostomy reversal. History includes perforated diverticulitis with Hartmann procedure in 2019 as well as anxiety.  Clinical Impression  Pt is a pleasant 23 year old male who was admitted for colostomy reversal. Pt demonstrates all bed mobility/transfers/ambulation at baseline level. Strength WNL. Reports he feels slightly off as he hasn't been able to tolerate full diet. He is looking forward to getting full meal today and then discharging home. Pt does not require any further PT needs at this time. Pt will be dc in house and does not require follow up. RN aware. Will dc current orders.     Follow Up Recommendations No PT follow up    Equipment Recommendations  None recommended by PT    Recommendations for Other Services       Precautions / Restrictions Precautions Precautions: None Restrictions Weight Bearing Restrictions: No      Mobility  Bed Mobility Overal bed mobility: Independent             General bed mobility comments: safe technique without assistance  Transfers Overall transfer level: Independent Equipment used: None             General transfer comment: safe technique  Ambulation/Gait Ambulation/Gait assistance: Supervision Gait Distance (Feet): 300 Feet Assistive device: IV Pole Gait Pattern/deviations: Step-through pattern     General Gait Details: occasional LOB with cues for sequencing IV pole during ambulation. 1 LOB with cga for recovery. Good endurance  Stairs            Wheelchair Mobility    Modified Rankin (Stroke Patients Only)       Balance Overall balance assessment: Mild deficits observed, not formally tested                                           Pertinent Vitals/Pain Pain  Assessment: 0-10 Pain Score: 5  Pain Location: abdomen Pain Descriptors / Indicators: Operative site guarding Pain Intervention(s): Limited activity within patient's tolerance    Home Living Family/patient expects to be discharged to:: Private residence Living Arrangements: Parent Available Help at Discharge: Family;Available PRN/intermittently Type of Home: Apartment Home Access: Stairs to enter Entrance Stairs-Rails: None Entrance Stairs-Number of Steps: 2 Home Layout: One level Home Equipment: None      Prior Function Level of Independence: Independent         Comments: works for [employer]        Extremity/Trunk Assessment   Upper Extremity Assessment Upper Extremity Assessment: Overall WFL for tasks assessed    Lower Extremity Assessment Lower Extremity Assessment: Overall WFL for tasks assessed       Communication   Communication: No difficulties  Cognition Arousal/Alertness: Awake/alert Behavior During Therapy: WFL for tasks assessed/performed Overall Cognitive Status: Within Functional Limits for tasks assessed                                        General Comments      Exercises     Assessment/Plan    PT Assessment Patent does not need any further PT services  PT  Problem List         PT Treatment Interventions      PT Goals (Current goals can be found in the Care Plan section)  Acute Rehab PT Goals Patient Stated Goal: to go home PT Goal Formulation: All assessment and education complete, DC therapy Time For Goal Achievement: 03/25/19 Potential to Achieve Goals: Good    Frequency     Barriers to discharge        Co-evaluation               AM-PAC PT "6 Clicks" Mobility  Outcome Measure Help needed turning from your back to your side while in a flat bed without using bedrails?: None Help needed moving from lying on your back to sitting on the side of a flat bed without using  bedrails?: None Help needed moving to and from a bed to a chair (including a wheelchair)?: None Help needed standing up from a chair using your arms (e.g., wheelchair or bedside chair)?: None Help needed to walk in hospital room?: None Help needed climbing 3-5 steps with a railing? : A Little 6 Click Score: 23    End of Session   Activity Tolerance: Patient tolerated treatment well Patient left: in bed Nurse Communication: Mobility status PT Visit Diagnosis: Unsteadiness on feet (R26.81)    Time: 4081-4481 PT Time Calculation (min) (ACUTE ONLY): 16 min   Charges:   PT Evaluation $PT Eval Low Complexity: 1 Low PT Treatments $Gait Training: 8-22 mins        Greggory Stallion, PT, DPT [phone]   Timothy [name] 03/25/2019, 10:05 AM

## 2019-03-25 NOTE — Discharge Summary (Addendum)
[facility] SURGICAL DISCHARGE SUMMARY  Patient ID: Timothy [name] MRN: [id number] DOB/AGE: 1996/03/01 23 y.o.  Admit date: 03/22/2019 Discharge date: 03/25/2019  Discharge Diagnoses Patient Active Problem List   Diagnosis Date Noted  . Sigmoid diverticulitis 03/22/2019  . S/P partial colectomy 01/30/2019  . 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 Family history of breast cancer   . Family history of bone cancer   . Tubular adenoma of colon 11/05/2018  . Diverticulitis of colon with perforation 07/21/2018  . Anxiety and depression 07/28/2017  . Other insomnia 07/28/2017  . Smoker 08/04/2015  . Obesity 08/04/2015  . Allergic rhinitis due to pollen 08/04/2015    Consultants None  Procedures Colostomy takedown and Reanastomosis of colon 07/24 - Dr Celine Ahr  HPI: Timothy [name] is a 23 y.o. male who underwent Hartmans Procedure in November of 2020 for perforated diverticulitis. He presents to [facility] on 07/24 for colostomy takedown and reanastomosis of the colon with Dr Celine Ahr.   Hospital Course: Informed consent was obtained and documented, and patient underwent uneventful colostomy takedown and reanastomosis (Dr Celine Ahr, 03/22/2019).  Post-operatively, patient had some issues with abdominal pain and anxiety, however these resolved on POD2 and 3. On POD3, he noted that his abdominal pain resolved and he began having significant flatus.Advancement of patient's diet and ambulation were well-tolerated. The remainder of patient's hospital course was essentially unremarkable, and discharge planning was initiated accordingly with patient safely able to be discharged home with appropriate discharge instructions, pain control, and outpatient follow-up after all of his and family's questions (discussed with mother via telephone) were answered to their expressed satisfaction.  Discharge Condition: Good  Physical Examination:  Constitutional: Well appearing male, NAD Pulmonary:  Normal effort, no respiratory distress Gastrointestinal: Obese, soft, mild incisional tenderness, non-distended, no rebound guarding Skin: Midline laparotomy incision is CDI, mild serosanguinous drainage to the middle portion, no erythema. Previous colostomy site is intact, wicks present with serosanguinous drainage, no erythema.    Allergies as of 03/25/2019   No Known Allergies     Medication List    STOP taking these medications   erythromycin base 500 MG tablet Commonly known as: E-MYCIN   neomycin 500 MG tablet Commonly known as: MYCIFRADIN     TAKE these medications   oxyCODONE 5 MG/5ML solution Commonly known as: ROXICODONE Take 10 mLs (10 mg total) by mouth every 3 (three) hours as needed for breakthrough pain.        Follow-up Information    Fredirick Maudlin, MD. Schedule an appointment as soon as possible for a visit on 03/27/2019.   Specialty: General Surgery Why: s/p colostomy reversal, has wicks in colostomy incision  Contact information: [address] [place] [address] [phone]            Time spent on discharge management including discussion of hospital course, clinical condition, outpatient instructions, prescriptions, and follow up with the patient and members of the medical team: >30 minutes  -- Edison Simon , PA-C [facility]  03/25/2019, 12:56 PM [phone] M-F: 7am - 4pm   I saw and evaluated the patient.  I agree with the above documentation, exam, and plan, which I have edited where appropriate. Fredirick Maudlin  1:37 PM

## 2019-03-27 ENCOUNTER — Ambulatory Visit (INDEPENDENT_AMBULATORY_CARE_PROVIDER_SITE_OTHER): Payer: BC Managed Care – PPO | Admitting: General Surgery

## 2019-03-27 ENCOUNTER — Other Ambulatory Visit: Payer: Self-pay

## 2019-03-27 ENCOUNTER — Encounter: Payer: Self-pay | Admitting: General Surgery

## 2019-03-27 VITALS — BP 121/77 | HR 88 | Temp 97.3°F | Resp 18 | Ht 72.0 in | Wt 255.4 lb

## 2019-03-27 DIAGNOSIS — Z9889 Other specified postprocedural states: Secondary | ICD-10-CM

## 2019-03-27 NOTE — Patient Instructions (Addendum)
Alternate the pain medication ibuprofen and tylenol.   Please change the dressing that is applied over the staples daily.   We will see you on Monday and change the packing. We will not remove the staples until Thursday when you see Dr.Cannon.

## 2019-03-27 NOTE — Anesthesia Postprocedure Evaluation (Signed)
Anesthesia Post Note  Patient: Timothy [name]  Procedure(s) Performed: COLOSTOMY REVERSAL (N/A )  Patient location during evaluation: PACU Anesthesia Type: General Level of consciousness: awake and alert Pain management: pain level controlled Vital Signs Assessment: post-procedure vital signs reviewed and stable Respiratory status: spontaneous breathing, nonlabored ventilation and respiratory function stable Cardiovascular status: blood pressure returned to baseline and stable Postop Assessment: no apparent nausea or vomiting Anesthetic complications: no     Last Vitals:  Vitals:   03/24/19 2007 03/25/19 1118  BP: 126/73 126/75  Pulse: 80 79  Resp: 16 16  Temp: 36.9 C 37.2 C  SpO2: 98% 96%    Last Pain:  Vitals:   03/25/19 1118  TempSrc: Oral  PainSc:                  Timothy [name]

## 2019-03-27 NOTE — Progress Notes (Signed)
Timothy [name] is here today for a wound check.  He is a 23 year old male who underwent reversal of his colostomy last Friday.  Timothy [name] were placed in the ostomy site.  He states that he has been doing fairly well at home, although he still has some symptoms of anxiety.  He says that oxycodone Nexium sweat, and that he does not want to take it at night.  He has been eating poorly and was experiencing abdominal pain.  He not eat because of the pain, however after he did eat, the pain resolved.  He has not had any fevers or chills.  He is having bowel movements and passing gas.  Vitals:   03/27/19 1012  BP: 121/77  Pulse: 88  Resp: 18  Temp: (!) 97.3 F (36.3 C)  SpO2: 97%   Focused abdominal exam: The vertical midline staples are intact.  There is a small amount of serous drainage at the inferior aspect.  There is no purulent discharge noted.  The Timothy [name] were removed from his ostomy site.  Once again, no pus was appreciated.  The Timothy [name] were replaced.  Timothy [name] is a 23 year old man who is now 5 days postop from his colostomy reversal.  He will return to clinic on Monday, to have his Timothy [name] replaced.  I will see him in clinic on Thursday to determine whether or not to remove his staples and to reassess his wounds.

## 2019-03-28 ENCOUNTER — Emergency Department
Admission: EM | Admit: 2019-03-28 | Discharge: 2019-03-28 | Disposition: A | Discharge status: Home / Self Care | Payer: BC Managed Care – PPO | Attending: Emergency Medicine | Admitting: Emergency Medicine

## 2019-03-28 ENCOUNTER — Encounter: Payer: Self-pay | Admitting: *Deleted

## 2019-03-28 ENCOUNTER — Other Ambulatory Visit: Payer: Self-pay

## 2019-03-28 DIAGNOSIS — Z5321 Procedure and treatment not carried out due to patient leaving prior to being seen by health care provider: Secondary | ICD-10-CM | POA: Insufficient documentation

## 2019-03-28 DIAGNOSIS — R1033 Periumbilical pain: Secondary | ICD-10-CM | POA: Diagnosis not present

## 2019-03-28 DIAGNOSIS — K5900 Constipation, unspecified: Secondary | ICD-10-CM | POA: Diagnosis not present

## 2019-03-28 LAB — CBC
HCT: 38.3 % — ABNORMAL LOW (ref 39.0–52.0)
Hemoglobin: 12.8 g/dL — ABNORMAL LOW (ref 13.0–17.0)
MCH: 29.7 pg (ref 26.0–34.0)
MCHC: 33.4 g/dL (ref 30.0–36.0)
MCV: 88.9 fL (ref 80.0–100.0)
Platelets: 306 10*3/uL (ref 150–400)
RBC: 4.31 MIL/uL (ref 4.22–5.81)
RDW: 12.9 % (ref 11.5–15.5)
WBC: 12.6 10*3/uL — ABNORMAL HIGH (ref 4.0–10.5)
nRBC: 0 % (ref 0.0–0.2)

## 2019-03-28 LAB — URINALYSIS, COMPLETE (UACMP) WITH MICROSCOPIC
Bilirubin Urine: NEGATIVE
Glucose, UA: NEGATIVE mg/dL
Hgb urine dipstick: NEGATIVE
Ketones, ur: NEGATIVE mg/dL
Leukocytes,Ua: NEGATIVE
Nitrite: NEGATIVE
Protein, ur: NEGATIVE mg/dL
Specific Gravity, Urine: 1.008 (ref 1.005–1.030)
Squamous Epithelial / HPF: NONE SEEN (ref 0–5)
pH: 6 (ref 5.0–8.0)

## 2019-03-28 LAB — COMPREHENSIVE METABOLIC PANEL
ALT: 28 U/L (ref 0–44)
AST: 24 U/L (ref 15–41)
Albumin: 3.8 g/dL (ref 3.5–5.0)
Alkaline Phosphatase: 70 U/L (ref 38–126)
Anion gap: 9 (ref 5–15)
BUN: 9 mg/dL (ref 6–20)
CO2: 26 mmol/L (ref 22–32)
Calcium: 8.8 mg/dL — ABNORMAL LOW (ref 8.9–10.3)
Chloride: 104 mmol/L (ref 98–111)
Creatinine, Ser: 0.75 mg/dL (ref 0.61–1.24)
GFR calc Af Amer: >60 mL/min (ref >60)
GFR calc non Af Amer: >60 mL/min (ref >60)
Glucose, Bld: 113 mg/dL — ABNORMAL HIGH (ref 70–99)
Potassium: 3.9 mmol/L (ref 3.5–5.1)
Sodium: 139 mmol/L (ref 135–145)
Total Bilirubin: 0.5 mg/dL (ref 0.3–1.2)
Total Protein: 7 g/dL (ref 6.5–8.1)

## 2019-03-28 LAB — LIPASE, BLOOD: Lipase: 19 U/L (ref 11–51)

## 2019-03-28 MED ORDER — SODIUM CHLORIDE 0.9% FLUSH: 3.0000 mL | Freq: Once | INTRAVENOUS | Status: DC

## 2019-03-28 NOTE — ED Notes (Addendum)
Mother called to report pt left ED walking; mother expresses frustration with pt over his refusal to stay and be evaluated; discusses recent agitation and anxiety of pt over his medical hx and surgery; st she has attempted several times to assist him with his care but remains difficult to deal with; st she attempted to get him to return to ED with no avail

## 2019-03-28 NOTE — ED Triage Notes (Signed)
Pt s/p colostomy revision/take down. Pt c/o periumbilical abdominal pain and constipation. Pt denies n/v. Pt last had small BM w/i past 24 hrs. Pt states only 2 BM's since leaving hospital and pt states he is passing flatus w/o difficulty. Pt is complaining of increased anxiety.

## 2019-03-28 NOTE — ED Notes (Signed)
Pt sitting in lobby with no distress; pt updated on wait time and voices good understanding

## 2019-03-28 NOTE — ED Notes (Signed)
No answer when called several times from lobby and outside ?

## 2019-03-28 NOTE — ED Triage Notes (Signed)
Pt is perseverating on getting/taking meds for anxiety that he believes helped him to have BM's. Pt states he feels as if the narcotic pain meds are causing him to have difficulties having BM's. Pt states he cannot swallow pills so everything has to be liquid.

## 2019-03-29 ENCOUNTER — Emergency Department
Admission: EM | Admit: 2019-03-29 | Discharge: 2019-03-29 | Disposition: A | Discharge status: Home / Self Care | Payer: BC Managed Care – PPO | Attending: Emergency Medicine | Admitting: Emergency Medicine

## 2019-03-29 ENCOUNTER — Encounter: Payer: Self-pay | Admitting: Emergency Medicine

## 2019-03-29 ENCOUNTER — Other Ambulatory Visit: Payer: Self-pay

## 2019-03-29 DIAGNOSIS — Z9889 Other specified postprocedural states: Secondary | ICD-10-CM | POA: Diagnosis not present

## 2019-03-29 DIAGNOSIS — K5900 Constipation, unspecified: Secondary | ICD-10-CM

## 2019-03-29 DIAGNOSIS — R109 Unspecified abdominal pain: Secondary | ICD-10-CM | POA: Diagnosis not present

## 2019-03-29 DIAGNOSIS — F17210 Nicotine dependence, cigarettes, uncomplicated: Secondary | ICD-10-CM | POA: Diagnosis not present

## 2019-03-29 LAB — CBC WITH DIFFERENTIAL/PLATELET
Abs Immature Granulocytes: 0.05 10*3/uL (ref 0.00–0.07)
Basophils Absolute: 0.1 10*3/uL (ref 0.0–0.1)
Basophils Relative: 0 %
Eosinophils Absolute: 0.4 10*3/uL (ref 0.0–0.5)
Eosinophils Relative: 3 %
HCT: 36.3 % — ABNORMAL LOW (ref 39.0–52.0)
Hemoglobin: 12.4 g/dL — ABNORMAL LOW (ref 13.0–17.0)
Immature Granulocytes: 0 %
Lymphocytes Relative: 19 %
Lymphs Abs: 2.3 10*3/uL (ref 0.7–4.0)
MCH: 30 pg (ref 26.0–34.0)
MCHC: 34.2 g/dL (ref 30.0–36.0)
MCV: 87.7 fL (ref 80.0–100.0)
Monocytes Absolute: 1.3 10*3/uL — ABNORMAL HIGH (ref 0.1–1.0)
Monocytes Relative: 10 %
Neutro Abs: 8.3 10*3/uL — ABNORMAL HIGH (ref 1.7–7.7)
Neutrophils Relative %: 68 %
Platelets: 266 10*3/uL (ref 150–400)
RBC: 4.14 MIL/uL — ABNORMAL LOW (ref 4.22–5.81)
RDW: 12.8 % (ref 11.5–15.5)
WBC: 12.4 10*3/uL — ABNORMAL HIGH (ref 4.0–10.5)
nRBC: 0 % (ref 0.0–0.2)

## 2019-03-29 LAB — COMPREHENSIVE METABOLIC PANEL
ALT: 28 U/L (ref 0–44)
AST: 24 U/L (ref 15–41)
Albumin: 3.4 g/dL — ABNORMAL LOW (ref 3.5–5.0)
Alkaline Phosphatase: 57 U/L (ref 38–126)
Anion gap: 10 (ref 5–15)
BUN: 8 mg/dL (ref 6–20)
CO2: 23 mmol/L (ref 22–32)
Calcium: 8.6 mg/dL — ABNORMAL LOW (ref 8.9–10.3)
Chloride: 107 mmol/L (ref 98–111)
Creatinine, Ser: 0.72 mg/dL (ref 0.61–1.24)
GFR calc Af Amer: >60 mL/min (ref >60)
GFR calc non Af Amer: >60 mL/min (ref >60)
Glucose, Bld: 95 mg/dL (ref 70–99)
Potassium: 3.4 mmol/L — ABNORMAL LOW (ref 3.5–5.1)
Sodium: 140 mmol/L (ref 135–145)
Total Bilirubin: 0.6 mg/dL (ref 0.3–1.2)
Total Protein: 6.9 g/dL (ref 6.5–8.1)

## 2019-03-29 LAB — LIPASE, BLOOD: Lipase: 18 U/L (ref 11–51)

## 2019-03-29 MED ORDER — IOHEXOL 240 MG/ML SOLN
25.0000 mL | INTRAMUSCULAR | Status: DC
  Administered 2019-03-29: 02:00:00 25 mL via ORAL

## 2019-03-29 MED ORDER — FENTANYL CITRATE (PF) 100 MCG/2ML IJ SOLN
50.0000 ug | Freq: Once | INTRAMUSCULAR | Status: AC
  Administered 2019-03-29: 50 ug via INTRAVENOUS
  Filled 2019-03-29: qty 2

## 2019-03-29 MED ORDER — POLYETHYLENE GLYCOL 3350 17 G PO PACK: 17.0000 g | PACK | Freq: Every day | ORAL | 0 refills | Status: DC

## 2019-03-29 MED ORDER — SODIUM CHLORIDE 0.9 % IV BOLUS
500.0000 mL | Freq: Once | INTRAVENOUS | Status: AC
  Administered 2019-03-29: 500 mL via INTRAVENOUS

## 2019-03-29 MED ORDER — SODIUM CHLORIDE 0.9 % IV BOLUS: 1000.0000 mL | Freq: Once | INTRAVENOUS | Status: DC

## 2019-03-29 NOTE — ED Notes (Signed)
ABD pad replaced on surgical wound.

## 2019-03-29 NOTE — ED Notes (Signed)
Patient states he doesn't want the CT scan at this time, but would like his surgical wound cleaned and re-dressed. Dr. Burlene Arnt at bedside.

## 2019-03-29 NOTE — ED Notes (Signed)
Patient finished first bottle of oral contrast.

## 2019-03-29 NOTE — ED Notes (Signed)
Patient states he feels dehydrated and anxious. Patient states he was given an Ativan two days after his surgery and then he had a bowel movement a few hours later. Patient states he is passing gas. Patient had an ostomy reversal.

## 2019-03-29 NOTE — ED Notes (Signed)
Patient's mother is coming to pick the patient up. Patient was reminded that he couldn't drive due to receiving Fentanyl this visit.

## 2019-03-29 NOTE — ED Notes (Signed)
NS 1000 ml bag held per Dr. Burlene Arnt.

## 2019-03-29 NOTE — ED Triage Notes (Signed)
Patient ambulatory to triage with steady gait, without difficulty or distress noted, mask in place; pt left this evening prior to being seen "because he was hungry"; returns to be evaluated for his persistent abd pain; protocols were performed previously

## 2019-03-29 NOTE — ED Notes (Signed)
Patient asked for and was given a blanket and a urinal.

## 2019-03-29 NOTE — Discharge Instructions (Signed)
Try using the MiraLAX dissolved in water on a daily basis to see if it helps her bowel movements.  Return to the emergency room if you change her mind about CT scan, he had fever vomiting increased pain, redness around the wound that is getting worse, or other concerns.  Otherwise follow closely with your surgeon.

## 2019-03-29 NOTE — ED Notes (Signed)
Patient c/o anxiety, but is able to keep calm at this time with discussion about what is going to happen and why drinking the contrast is important. Patient is drinking oral contrast at this time.

## 2019-03-29 NOTE — ED Provider Notes (Addendum)
[facility] Emergency Department Provider Note  ____________________________________________   I have reviewed the triage vital signs and the nursing notes. Where available I have reviewed prior notes and, if possible and indicated, outside hospital notes.   Patient seen and evaluated during the coronavirus epidemic during a time with low staffing  Patient seen for the symptoms described in the history of present illness. She was evaluated in the context of the global COVID-19 pandemic, which necessitated consideration that the patient might be at risk for infection with the SARS-CoV-2 virus that causes COVID-19. Institutional protocols and algorithms that pertain to the evaluation of patients at risk for COVID-19 are in a state of rapid change based on information released by regulatory bodies including the CDC and federal and state organizations. These policies and algorithms were followed during the patient's care in the ED.    HISTORY  Chief Complaint Abdominal Pain    HPI Timothy [name] is a 23 y.o. male with a history of colostomy takedown on 03/22/2019, discharged 03/25/2019, does have history of abdominal pain and anxiety.  He has been having bowel movements at home but they are small.  He states that he is here because he wanted to talk about his bowel movements.  He was worried that they were small.  Last bowel movement he thinks was yesterday.  He was here earlier today but he left because he was hungry.  He went out and had a good meal and now he is back.  He states he is also been having ongoing abdominal pain.  He is only taken 1 of his pain medications today because he is worried his pain medications make him feel little dehydrated and when he gets dehydrated he gets anxious.  He is not suffering from any anxiety at this moment he states.  He denies any vomiting fever or melena.  He has seen a doctor as an outpatient and had his dressing changed recently.  He  does state however that he has what he thinks to be some degree of increased pain that he is very poor at describing.  Patient is a poor historian.      Past Medical History:  Diagnosis Date  . Allergy   . Depression   . Diverticulitis   . Dyspnea   . Fainting spell    in middle school  . Family history of bone cancer   . Family history of breast cancer     Patient Active Problem List   Diagnosis Date Noted  . Sigmoid diverticulitis 03/22/2019  . S/P partial colectomy 01/30/2019  . Colostomy in place [facility]) 01/30/2019  . Family history of breast cancer   . Family history of bone cancer   . Tubular adenoma of colon 11/05/2018  . Diverticulitis of colon with perforation 07/21/2018  . Anxiety and depression 07/28/2017  . Other insomnia 07/28/2017  . Smoker 08/04/2015  . Obesity 08/04/2015  . Allergic rhinitis due to pollen 08/04/2015    Past Surgical History:  Procedure Laterality Date  . COLECTOMY WITH COLOSTOMY CREATION/HARTMANN PROCEDURE N/A 07/22/2018   Procedure: COLECTOMY WITH COLOSTOMY CREATION/HARTMANN PROCEDURE;  Surgeon: Fredirick Maudlin, MD;  Location: [facility] ORS;  Service: General;  Laterality: N/A;  . COLONOSCOPY WITH PROPOFOL N/A 11/01/2018   Procedure: COLONOSCOPY WITH PROPOFOL;  Surgeon: [name], MD;  Location: [facility] ENDOSCOPY;  Service: Gastroenterology;  Laterality: N/A;  . COLOSTOMY REVERSAL N/A 03/22/2019   Procedure: COLOSTOMY REVERSAL;  Surgeon: Fredirick Maudlin, MD;  Location: [facility] ORS;  Service: General;  Laterality: N/A;    Prior to Admission medications   Medication Sig Start Date End Date Taking? Authorizing Provider  oxyCODONE (ROXICODONE) 5 MG/5ML solution Take 10 mLs (10 mg total) by mouth every 3 (three) hours as needed for breakthrough pain. 03/25/19   Tylene Fantasia, PA-C    Allergies Patient has no known allergies.  Family History  Problem Relation Age of Onset  . Heart disease Maternal Grandmother   . Diabetes Maternal Grandmother    . Cancer Maternal Grandmother        breast cancer  . Alport syndrome Maternal Grandmother   . Cancer Paternal Grandfather        Bone cancer  . Healthy Sister   . Alport syndrome Maternal Aunt   . Depression Mother   . Cervical cancer Mother     Social History Social History   Tobacco Use  . Smoking status: Current Every Day Smoker    Packs/day: 0.25    Types: Cigarettes    Last attempt to quit: 01/30/2019    Years since quitting: 0.1  . Smokeless tobacco: Never Used  . Tobacco comment: pt smoke pack a week  Substance Use Topics  . Alcohol use: Yes    Alcohol/week: 2.0 standard drinks    Types: 2 Cans of beer per week    Comment: occassionally  . Drug use: No    Review of Systems Constitutional: No fever/chills Eyes: No visual changes. ENT: No sore throat. No stiff neck no neck pain Cardiovascular: Denies chest pain. Respiratory: Denies shortness of breath. Gastrointestinal:   no vomiting.  No diarrhea.  No constipation. Genitourinary: Negative for dysuria. Musculoskeletal: Negative lower extremity swelling Skin: Negative for rash. Neurological: Negative for severe headaches, focal weakness or numbness.   ____________________________________________   PHYSICAL EXAM:  VITAL SIGNS: ED Triage Vitals  Enc Vitals Group     BP 03/29/19 0024 133/81     Pulse Rate 03/29/19 0024 84     Resp 03/29/19 0024 16     Temp 03/29/19 0024 98.9 F (37.2 C)     Temp Source 03/29/19 0024 Oral     SpO2 03/29/19 0024 97 %     Weight 03/29/19 0023 255 lb (115.7 kg)     Height 03/29/19 0023 6' (1.829 m)     Head Circumference --      Peak Flow --      Pain Score 03/29/19 0023 9     Pain Loc --      Pain Edu? --      Excl. in [place]? --     Constitutional: Alert and oriented. Well appearing and in no acute distress. Eyes: Conjunctivae are normal Head: Atraumatic HEENT: No congestion/rhinnorhea. Mucous membranes are moist.  Oropharynx non-erythematous Neck:   Nontender with  no meningismus, no masses, no stridor Cardiovascular: Normal rate, regular rhythm. Grossly normal heart sounds.  Good peripheral circulation. Respiratory: Normal respiratory effort.  No retractions. Lungs CTAB. Abdominal: Soft and there is tenderness around the incision sites the incisions are intact, there is some serosanguineous appearing drainage.  No significant erythema.  No rebound tenderness, but patient does not want me to touch around the area of the incision.  So there is some guarding. Back:  There is no focal tenderness or step off.  there is no midline tenderness there are no lesions noted. there is no CVA tenderness  Musculoskeletal: No lower extremity tenderness, no upper extremity tenderness. No joint effusions, no DVT signs strong  distal pulses no edema Neurologic:  Normal speech and language. No gross focal neurologic deficits are appreciated.  Skin:  Skin is warm, dry and intact. No rash noted. Psychiatric: Mood and affect are normal. Speech and behavior are normal.  ____________________________________________   LABS (all labs ordered are listed, but only abnormal results are displayed)  Labs Reviewed  COMPREHENSIVE METABOLIC PANEL  CBC WITH DIFFERENTIAL/PLATELET  URINALYSIS, COMPLETE (UACMP) WITH MICROSCOPIC  LIPASE, BLOOD    Pertinent labs  results that were available during my care of the patient were reviewed by me and considered in my medical decision making (see chart for details). ____________________________________________  EKG  I personally interpreted any EKGs ordered by me or triage  ____________________________________________  RADIOLOGY  Pertinent labs & imaging results that were available during my care of the patient were reviewed by me and considered in my medical decision making (see chart for details). If possible, patient and/or family made aware of any abnormal findings.  No results found. ____________________________________________     PROCEDURES  Procedure(s) performed: None  Procedures  Critical Care performed: None  ____________________________________________   INITIAL IMPRESSION / ASSESSMENT AND PLAN / ED COURSE  Pertinent labs & imaging results that were available during my care of the patient were reviewed by me and considered in my medical decision making (see chart for details).   Patient here with abdominal pain and concerns about his bowel movements after recent colostomy takedown.  His belly is reassuring but tender and there is some degree of poor history from this patient and he is having some tenderness.  Is certainly possible this is routine postoperative tenderness.  However, given limited history and recent surgery will obtain imaging and reassess. ----------------------------------------- 3:40 AM on 03/29/2019 -----------------------------------------  Patient reports having a large and satisfying bowel movement, which he photographed, states he feels much better at this time.  He still has some incisional tenderness.  We will see if the CT scan is reassuring and reassess.  Patient is given the opportunity to decline a CT of course if he elects to do so but given complicated recent surgical history and presentation of abdominal pain, I feel that imaging might be of utility  ----------------------------------------- 3:57 AM on 03/29/2019 -----------------------------------------  Patient states he feels so much better after his bowel movement, which he offered to show me on the phone, that he wants to go home and he refuses a CT scan.  His abdomen is benign he has minimal incisional tenderness at this time.  He does understand the risk benefits alternative to having a CT scan and not having a CT scan, the risk could include missed pathology that could lead to repeat surgery or even death and he understands this.  However, he states that he was mostly here because he was having small bowel movements  and now that he had a big bowel movement he does not want to stay anymore.  I did talk to Dr. Celine Ahr, who knows the patient well and suggest that this is what he told her on the phone before coming in, which is somewhat different from what he told me when he arrived, and that she is comfortable with this discharge without imaging.  Therefore, we will discharge the patient with return precautions follow-up given understood.    ____________________________________________   FINAL CLINICAL IMPRESSION(S) / ED DIAGNOSES  Final diagnoses:  None      This chart was dictated using voice recognition software.  Despite best efforts to proofread,  errors  can occur which can change meaning.      Schuyler Amor, MD 03/29/19 [id number]    Schuyler Amor, MD 03/29/19 [id number]    Schuyler Amor, MD 03/29/19 [phone]

## 2019-03-29 NOTE — ED Notes (Signed)
2nd bottle of oral contrast not given at this time.

## 2019-03-29 NOTE — ED Notes (Signed)
Patient had a BM and flushed it, but took a picture to show. Dr. Burlene Arnt aware.Patient states the pain was mostly his staples, but would like another dose of Fentanyl which he states he would like to take home with him.

## 2019-04-01 ENCOUNTER — Other Ambulatory Visit: Payer: Self-pay

## 2019-04-01 ENCOUNTER — Ambulatory Visit (INDEPENDENT_AMBULATORY_CARE_PROVIDER_SITE_OTHER): Payer: BC Managed Care – PPO

## 2019-04-01 VITALS — BP 127/82 | HR 90 | Temp 98.1°F | Resp 16 | Ht 72.0 in | Wt 243.0 lb

## 2019-04-01 DIAGNOSIS — Z9889 Other specified postprocedural states: Secondary | ICD-10-CM

## 2019-04-01 NOTE — Progress Notes (Signed)
Removed packing today and replaced with new packing.

## 2019-04-01 NOTE — Patient Instructions (Signed)
Please continue to change the dressing daily.

## 2019-04-04 ENCOUNTER — Other Ambulatory Visit: Payer: Self-pay

## 2019-04-04 ENCOUNTER — Ambulatory Visit (INDEPENDENT_AMBULATORY_CARE_PROVIDER_SITE_OTHER): Payer: BC Managed Care – PPO | Admitting: General Surgery

## 2019-04-04 ENCOUNTER — Encounter: Payer: Self-pay | Admitting: General Surgery

## 2019-04-04 VITALS — BP 143/84 | HR 92 | Temp 97.5°F | Ht 68.0 in | Wt 242.0 lb

## 2019-04-04 DIAGNOSIS — Z9889 Other specified postprocedural states: Secondary | ICD-10-CM

## 2019-04-04 NOTE — Progress Notes (Signed)
Timothy [name] is here today for a postoperative visit.  He is a 23 year old man who underwent a Hartman's procedure last fall for perforated diverticulitis.  His ostomy was reversed on July 24.  I put wicks in his old ostomy site.  I saw him on July 29 and he was doing well.  He subsequently presented to the emergency department with constipation and abdominal pain.  Fortunately, he was able to relieve the pain by defecating during his ED visit.  He left without additional imaging or treatment.  Today, he states that he stopped taking narcotics and has had no further issues.  Vitals:   04/04/19 1516  BP: (!) 143/84  Pulse: 92  Temp: (!) 97.5 F (36.4 C)  SpO2: 97%   Focused examination: The staple line is intact.  There is a dry dressing overlying the inferior aspect of the incision.  This was removed.  There is some light brown staining on the gauze.  The wicks were removed from the ostomy site.  The site was probed and there is no purulent discharge present.  The wound is beginning to granulate from the base up.  I removed his staples today and applied Steri-Strips.  The packing wicks were replaced in the ostomy site.  He was advised to shower and allow warm soapy water to flow over his incisions.  He should pat everything dry.  I will see him in 1 week for reevaluation.

## 2019-04-04 NOTE — Patient Instructions (Signed)
Return in one week. May shower. Change the outside dressing daily.

## 2019-04-11 ENCOUNTER — Ambulatory Visit (INDEPENDENT_AMBULATORY_CARE_PROVIDER_SITE_OTHER): Payer: BC Managed Care – PPO | Admitting: General Surgery

## 2019-04-11 ENCOUNTER — Encounter: Payer: Self-pay | Admitting: General Surgery

## 2019-04-11 ENCOUNTER — Other Ambulatory Visit: Payer: Self-pay

## 2019-04-11 VITALS — BP 152/84 | HR 103 | Temp 97.9°F | Resp 16 | Ht 72.0 in | Wt 245.0 lb

## 2019-04-11 DIAGNOSIS — Z9889 Other specified postprocedural states: Secondary | ICD-10-CM

## 2019-04-11 NOTE — Progress Notes (Signed)
Timothy [name] is here today for another postop check.  He is a 23 year old man who had a Hartman's procedure last fall.  His colostomy was reversed on 24 July.  We have been packing his ostomy site with wicks.  He states that he has been doing well without any concerns or complaints.  Today's Vitals   04/11/19 0921  BP: (!) 152/84  Pulse: (!) 103  Resp: 16  Temp: 97.9 F (36.6 C)  TempSrc: Temporal  SpO2: 98%  Weight: 245 lb (111.1 kg)  Height: 6' (1.829 m)  PainSc: 0-No pain   Body mass index is 33.23 kg/m.   Focused exam: The wicks were removed.  The site is significantly shallower than at his last visit.  There is no purulent drainage.  The staples were removed.  Wicks were replaced.  Impression and plan: Timothy [name] is a 23 year old male doing well after colostomy reversal.  We will continue weekly visits to assess closure of his ostomy site.

## 2019-04-11 NOTE — Patient Instructions (Signed)
Remove the packing and shower the morning of this appointment.  Please change the dressing daily.

## 2019-04-18 ENCOUNTER — Ambulatory Visit (INDEPENDENT_AMBULATORY_CARE_PROVIDER_SITE_OTHER): Payer: BC Managed Care – PPO | Admitting: General Surgery

## 2019-04-18 ENCOUNTER — Other Ambulatory Visit: Payer: Self-pay

## 2019-04-18 ENCOUNTER — Encounter: Payer: Self-pay | Admitting: General Surgery

## 2019-04-18 VITALS — BP 135/75 | HR 82 | Temp 97.5°F | Ht 72.0 in | Wt 244.0 lb

## 2019-04-18 DIAGNOSIS — Z9889 Other specified postprocedural states: Secondary | ICD-10-CM

## 2019-04-18 NOTE — Progress Notes (Signed)
Timothy [name] is here today for a wound check.  He is a 23 year old man who underwent reversal of his colostomy from a prior Hartman's procedure.  We have been monitoring the healing of his ostomy site.  It has had wicks placed.  Last week, he was healing well and had not had any fevers chills nausea or vomiting.  His bowels are moving normally.  He reports the same today.  Today's Vitals   04/18/19 0902  BP: 135/75  Pulse: 82  Temp: (!) 97.5 F (36.4 C)  TempSrc: Temporal  SpO2: 97%  Weight: 244 lb (110.7 kg)  Height: 6' (1.829 m)   Body mass index is 33.09 kg/m. Focused abdominal exam: As per our request, he had removed the wicks in the shower this morning.  Both incision sites are closing nicely.  There is no erythema, induration, purulent drainage, or foul odor.  They were probed with a Q-tip and are quite shallow at this time.   Impression and plan: This is a 23 year old man who underwent a Hartman's procedure last fall and had subsequent reversal of his colostomy.  He is doing well and his ostomy site is healing nicely.  I do not think it needs packing any longer.  He should continue to shower daily and let warm soapy water flow through the wound.  I will see him next week for repeat wound check.

## 2019-04-18 NOTE — Patient Instructions (Addendum)
The patient is aware to call back for any questions or new concerns. may shower and apply dry gauze daily

## 2019-04-23 ENCOUNTER — Other Ambulatory Visit: Payer: Self-pay

## 2019-04-23 ENCOUNTER — Ambulatory Visit (INDEPENDENT_AMBULATORY_CARE_PROVIDER_SITE_OTHER): Payer: BC Managed Care – PPO | Admitting: General Surgery

## 2019-04-23 ENCOUNTER — Encounter: Payer: Self-pay | Admitting: General Surgery

## 2019-04-23 VITALS — BP 140/84 | HR 96 | Temp 98.2°F | Ht 72.0 in | Wt 250.6 lb

## 2019-04-23 DIAGNOSIS — Z9889 Other specified postprocedural states: Secondary | ICD-10-CM

## 2019-04-23 NOTE — Progress Notes (Signed)
Timothy [name] is here today for a postoperative visit after undergoing colostomy reversal on July 24.  We have been seeing him weekly to monitor the closure of the old ostomy site.  Last week, it was quite shallow and I did not replace the wicks.  Today, he says he thinks it is completely closed.  Today's Vitals   04/23/19 1050  BP: 140/84  Pulse: 96  Temp: 98.2 F (36.8 C)  TempSrc: Temporal  SpO2: 97%  Weight: 250 lb 9.6 oz (113.7 kg)  Height: 6' (1.829 m)   Body mass index is 33.99 kg/m. Focused abdominal exam: The 2 small holes left in his old colostomy site have closed.  There is some scabbing at the surface, but no erythema, induration, or purulent drainage.  Impression and plan: [name] is a 23 year old man who had a Hartmann procedure last fall, followed by ostomy reversal in July.  He is doing well and his old ostomy site has closed.  At this time, he needs no additional surgical follow-up.  He was cautioned to continue to refrain from lifting anything heavier than 10 pounds for another 2 weeks, after which he may resume all of his usual activities.  We will see him on an as-needed basis.

## 2019-04-23 NOTE — Patient Instructions (Addendum)
The patient is aware to call back for any questions or new concerns. Avoid lifting over 10 pounds for 2 more weeks. Resume activities as tolerated.

## 2019-08-05 ENCOUNTER — Other Ambulatory Visit: Payer: Self-pay

## 2019-08-05 DIAGNOSIS — Z20822 Contact with and (suspected) exposure to covid-19: Secondary | ICD-10-CM

## 2019-08-07 LAB — NOVEL CORONAVIRUS, NAA: SARS-CoV-2, NAA: NOT DETECTED

## 2019-08-20 ENCOUNTER — Ambulatory Visit: Payer: BC Managed Care – PPO | Attending: Internal Medicine

## 2019-08-20 DIAGNOSIS — Z20822 Contact with and (suspected) exposure to covid-19: Secondary | ICD-10-CM

## 2019-08-22 LAB — NOVEL CORONAVIRUS, NAA: SARS-CoV-2, NAA: NOT DETECTED

## 2019-08-29 ENCOUNTER — Emergency Department
Admission: EM | Admit: 2019-08-29 | Discharge: 2019-08-29 | Disposition: A | Discharge status: Home / Self Care | Payer: BC Managed Care – PPO | Attending: Emergency Medicine | Admitting: Emergency Medicine

## 2019-08-29 ENCOUNTER — Emergency Department: Payer: BC Managed Care – PPO

## 2019-08-29 ENCOUNTER — Encounter: Payer: Self-pay | Admitting: Emergency Medicine

## 2019-08-29 ENCOUNTER — Other Ambulatory Visit: Payer: Self-pay

## 2019-08-29 DIAGNOSIS — F419 Anxiety disorder, unspecified: Secondary | ICD-10-CM

## 2019-08-29 DIAGNOSIS — R42 Dizziness and giddiness: Secondary | ICD-10-CM

## 2019-08-29 DIAGNOSIS — F17210 Nicotine dependence, cigarettes, uncomplicated: Secondary | ICD-10-CM | POA: Diagnosis not present

## 2019-08-29 LAB — URINALYSIS, COMPLETE (UACMP) WITH MICROSCOPIC
Bacteria, UA: NONE SEEN
Bilirubin Urine: NEGATIVE
Glucose, UA: NEGATIVE mg/dL
Hgb urine dipstick: NEGATIVE
Ketones, ur: NEGATIVE mg/dL
Leukocytes,Ua: NEGATIVE
Nitrite: NEGATIVE
Protein, ur: NEGATIVE mg/dL
Specific Gravity, Urine: 1 — ABNORMAL LOW (ref 1.005–1.030)
Squamous Epithelial / HPF: NONE SEEN (ref 0–5)
WBC, UA: NONE SEEN WBC/hpf (ref 0–5)
pH: 7 (ref 5.0–8.0)

## 2019-08-29 LAB — CBC
HCT: 45.5 % (ref 39.0–52.0)
Hemoglobin: 15.1 g/dL (ref 13.0–17.0)
MCH: 28.8 pg (ref 26.0–34.0)
MCHC: 33.2 g/dL (ref 30.0–36.0)
MCV: 86.8 fL (ref 80.0–100.0)
Platelets: 263 10*3/uL (ref 150–400)
RBC: 5.24 MIL/uL (ref 4.22–5.81)
RDW: 12.9 % (ref 11.5–15.5)
WBC: 9.1 10*3/uL (ref 4.0–10.5)
nRBC: 0 % (ref 0.0–0.2)

## 2019-08-29 LAB — BASIC METABOLIC PANEL
Anion gap: 7 (ref 5–15)
BUN: 9 mg/dL (ref 6–20)
CO2: 25 mmol/L (ref 22–32)
Calcium: 9.1 mg/dL (ref 8.9–10.3)
Chloride: 105 mmol/L (ref 98–111)
Creatinine, Ser: 0.79 mg/dL (ref 0.61–1.24)
GFR calc Af Amer: >60 mL/min (ref >60)
GFR calc non Af Amer: >60 mL/min (ref >60)
Glucose, Bld: 100 mg/dL — ABNORMAL HIGH (ref 70–99)
Potassium: 3.9 mmol/L (ref 3.5–5.1)
Sodium: 137 mmol/L (ref 135–145)

## 2019-08-29 LAB — GLUCOSE, CAPILLARY: Glucose-Capillary: 87 mg/dL (ref 70–99)

## 2019-08-29 MED ORDER — MECLIZINE HCL 12.5 MG PO TABS: ORAL_TABLET | ORAL | 0 refills | Status: DC

## 2019-08-29 NOTE — ED Notes (Signed)
See triage note  Presents with some dizziness for couple of days  Denies any pain

## 2019-08-29 NOTE — ED Triage Notes (Signed)
Pt reports ate breakfast at 10 AM yesterday.  Pt continues to state "I was hungry at 5 yesterday evening and I started sweating and getting dizzy.  I got home at ate a PB sandwich and some ramen noodles.  I started feeling better.  I woke up this morning and walked my dog around the block and then went in and ate 2 chicken sandwiches.  I decided to go ahead and work, I work for [employer], on the way to drop it off I thought I was getting dizzy again but not sure".  Have attempted multiple times to clarify with patient reason for visit and seems to be concerned about his dizziness and thinks he may be diabetic.  Mom was covid + 2 weeks ago but pt has been tested twice with no sx and has not had any sx.  Pt reports google told him this is sx of heart attack or diabetes and his dad thinks it is anxiety.

## 2019-08-29 NOTE — ED Provider Notes (Signed)
[facility] Emergency Department Provider Note   ____________________________________________   First MD Initiated Contact with Patient 08/29/19 1123     (approximate)  I have reviewed the triage vital signs and the nursing notes.   HISTORY  Chief Complaint Dizziness   HPI Timothy [name] is a 23 y.o. male presents to the ED with complaint of dizziness.  Patient states that he has been eating somewhat irregular but there is no connection with his sweating and dizziness that occurs.  Patient states that he has dizziness intermittently and thought that he was diabetic.  He also has been told that he has anxiety issues.  Patient states that his mother was Covid +2 weeks ago and he has been tested twice with no positive results and he denies any symptoms of Covid at this time.  Patient googled his symptoms which came up as either "heart attack or diabetes".  Patient denies any pain at this time.      Past Medical History:  Diagnosis Date  . Allergy   . Depression   . Diverticulitis   . Dyspnea   . Fainting spell    in middle school  . Family history of bone cancer   . Family history of breast cancer     Patient Active Problem List   Diagnosis Date Noted  . Sigmoid diverticulitis 03/22/2019  . S/P partial colectomy 01/30/2019  . 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 Family history of breast cancer   . Family history of bone cancer   . Tubular adenoma of colon 11/05/2018  . Diverticulitis of colon with perforation 07/21/2018  . Anxiety and depression 07/28/2017  . Other insomnia 07/28/2017  . Smoker 08/04/2015  . Obesity 08/04/2015  . Allergic rhinitis due to pollen 08/04/2015    Past Surgical History:  Procedure Laterality Date  . COLECTOMY WITH COLOSTOMY CREATION/HARTMANN PROCEDURE N/A 07/22/2018   Procedure: COLECTOMY WITH COLOSTOMY CREATION/HARTMANN PROCEDURE;  Surgeon: Fredirick Maudlin, MD;  Location: [facility] ORS;  Service: General;   Laterality: N/A;  . COLONOSCOPY WITH PROPOFOL N/A 11/01/2018   Procedure: COLONOSCOPY WITH PROPOFOL;  Surgeon: [name], MD;  Location: [facility] ENDOSCOPY;  Service: Gastroenterology;  Laterality: N/A;  . COLOSTOMY REVERSAL N/A 03/22/2019   Procedure: COLOSTOMY REVERSAL;  Surgeon: Fredirick Maudlin, MD;  Location: [facility] ORS;  Service: General;  Laterality: N/A;    Prior to Admission medications   Medication Sig Start Date End Date Taking? Authorizing Provider  meclizine (ANTIVERT) 12.5 MG tablet 1 or 2 tablets to chew and swallow every 8 hours as needed for anxiety. 08/29/19   Johnn Hai, PA-C    Allergies Patient has no known allergies.  Family History  Problem Relation Age of Onset  . Heart disease Maternal Grandmother   . Diabetes Maternal Grandmother   . Cancer Maternal Grandmother        breast cancer  . Alport syndrome Maternal Grandmother   . Cancer Paternal Grandfather        Bone cancer  . Healthy Sister   . Alport syndrome Maternal Aunt   . Depression Mother   . Cervical cancer Mother     Social History Social History   Tobacco Use  . Smoking status: Current Every Day Smoker    Packs/day: 0.25    Types: Cigarettes    Last attempt to quit: 01/30/2019    Years since quitting: 0.5  . Smokeless tobacco: Never Used  . Tobacco comment: pt smoke pack a week  Substance Use  Topics  . Alcohol use: Yes    Alcohol/week: 2.0 standard drinks    Types: 2 Cans of beer per week    Comment: occassionally  . Drug use: No    Review of Systems Constitutional: No fever/chills.  Positive for dizziness.   Eyes: No visual changes. ENT: No sore throat. Cardiovascular: Denies chest pain. Respiratory: Denies shortness of breath.  Negative for coughing. Gastrointestinal: No abdominal pain.  No nausea, no vomiting.  No diarrhea.  No constipation. Genitourinary: Negative for dysuria. Musculoskeletal: Negative for muscle aches. Skin: Negative for rash. Neurological: Negative for  headaches, focal weakness or numbness. ___________________________________________   PHYSICAL EXAM:  VITAL SIGNS: ED Triage Vitals  Enc Vitals Group     BP 08/29/19 1112 127/79     Pulse Rate 08/29/19 1112 86     Resp 08/29/19 1112 18     Temp 08/29/19 1112 98.4 F (36.9 C)     Temp Source 08/29/19 1112 Oral     SpO2 08/29/19 1112 97 %     Weight 08/29/19 1112 263 lb (119.3 kg)     Height 08/29/19 1112 6' (1.829 m)     Head Circumference --      Peak Flow --      Pain Score 08/29/19 1110 0     Pain Loc --      Pain Edu? --      Excl. in [place]? --    Constitutional: Alert and oriented. Well appearing and in no acute distress. Eyes: Conjunctivae are normal. PERRL. EOMI. no nystagmus. Head: Atraumatic. Nose: No congestion/rhinnorhea. Mouth/Throat: Mucous membranes are moist.  Oropharynx non-erythematous. Neck: No stridor.   Hematological/Lymphatic/Immunilogical: No cervical lymphadenopathy. Cardiovascular: Normal rate, regular rhythm. Grossly normal heart sounds.  Good peripheral circulation. Respiratory: Normal respiratory effort.  No retractions. Lungs CTAB. Gastrointestinal: Soft and nontender. No distention.  Musculoskeletal: Moves upper and lower extremities with any difficulty.  Normal gait was noted.  Good muscle strength bilaterally. Neurologic:  Normal speech and language. No gross focal neurologic deficits are appreciated. No gait instability. Skin:  Skin is warm, dry and intact. No rash noted. Psychiatric: Mood and affect are normal. Speech and behavior are normal.  ____________________________________________   LABS (all labs ordered are listed, but only abnormal results are displayed)  Labs Reviewed  BASIC METABOLIC PANEL - Abnormal; Notable for the following components:      Result Value   Glucose, Bld 100 (*)    All other components within normal limits  URINALYSIS, COMPLETE (UACMP) WITH MICROSCOPIC - Abnormal; Notable for the following components:   Color,  Urine COLORLESS (*)    APPearance CLEAR (*)    Specific Gravity, Urine 1.000 (*)    All other components within normal limits  CBC  GLUCOSE, CAPILLARY  CBG MONITORING, ED   ____________________________________________  EKG  EKG was reviewed by Dr. Corky Downs with normal sinus rhythm. Ventricular rate 80, PR interval 126, QRS duration 88. ____________________________________________  RADIOLOGY  Official radiology report(s): DG Chest Portable 1 View  Result Date: 08/29/2019 CLINICAL DATA:  Dizziness EXAM: PORTABLE CHEST 1 VIEW COMPARISON:  08/28/2017 FINDINGS: The heart size and mediastinal contours are within normal limits. Both lungs are clear. The visualized skeletal structures are unremarkable. IMPRESSION: No active disease. Electronically Signed   By: Inez Catalina M.D.   On: 08/29/2019 12:15    ____________________________________________   PROCEDURES  Procedure(s) performed (including Critical Care):  Procedures ____________________________________________   INITIAL IMPRESSION / ASSESSMENT AND PLAN / ED COURSE  As  part of my medical decision making, I reviewed the following data within the [place] Notes from prior ED visits and [place] Controlled Substance Database  Timothy [name] was evaluated in Emergency Department on 08/29/2019 for the symptoms described in the history of present illness. He was evaluated in the context of the global COVID-19 pandemic, which necessitated consideration that the patient might be at risk for infection with the SARS-CoV-2 virus that causes COVID-19. Institutional protocols and algorithms that pertain to the evaluation of patients at risk for COVID-19 are in a state of rapid change based on information released by regulatory bodies including the CDC and federal and state organizations. These policies and algorithms were followed during the patient's care in the ED.  23 year old male presents to the ED with complaint of  dizziness along with multiple other issues including anxiety.  Patient states that he knows he becomes very anxious but has not sought any medical intervention for it.  He is reassured that his test results are negative.  In talking with him there is no reason to test him for Covid today as he has no symptoms and is already been tested in the past and was negative.  Patient was given a prescription for Antivert 1 or 2 tablets to chew every 8 hours as needed as he cannot swallow pills.  He was given information about [facility] to follow-up with for his anxiety.  He is to return to the emergency department if any severe worsening of his symptoms.  ____________________________________________   FINAL CLINICAL IMPRESSION(S) / ED DIAGNOSES  Final diagnoses:  Dizziness  Anxiety     ED Discharge Orders         Ordered    meclizine (ANTIVERT) 12.5 MG tablet     08/29/19 1234           Note:  This document was prepared using Dragon voice recognition software and may include unintentional dictation errors.    Johnn Hai, PA-C 08/29/19 1413    Lavonia Drafts, MD 08/29/19 1450

## 2019-08-29 NOTE — Discharge Instructions (Addendum)
Follow-up with your primary care provider if any continued problems or concerns.  Also information about [facility] health services was listed on your discharge paper along with a separate paper with their walk-in hours Monday Wednesday and Friday from 8 AM to 3 PM.  The Antivert tablets can be chewed as needed for dizziness.  Increase fluids.

## 2019-09-16 ENCOUNTER — Emergency Department
Admission: EM | Admit: 2019-09-16 | Discharge: 2019-09-16 | Disposition: A | Discharge status: Home / Self Care | Payer: BC Managed Care – PPO | Attending: Internal Medicine | Admitting: Internal Medicine

## 2019-09-16 ENCOUNTER — Other Ambulatory Visit: Payer: Self-pay

## 2019-09-16 DIAGNOSIS — F419 Anxiety disorder, unspecified: Secondary | ICD-10-CM | POA: Diagnosis not present

## 2019-09-16 DIAGNOSIS — R42 Dizziness and giddiness: Secondary | ICD-10-CM | POA: Insufficient documentation

## 2019-09-16 DIAGNOSIS — Z5321 Procedure and treatment not carried out due to patient leaving prior to being seen by health care provider: Secondary | ICD-10-CM | POA: Diagnosis not present

## 2019-09-16 NOTE — ED Triage Notes (Signed)
Pt to the er for multiple medical claims. He was seen here two weeks ago. Family states he needs to see psych. Pt reports anxiety attacks and dizziness. Pt was given antivert and follow up [facility] and pt did neither. Pt says he wants and MRI for a pinched nerve and a blood clot. Explained that is not the process. Explained to pt he needs to follow up with plan of care and that we will work him up for anxiety.

## 2019-09-16 NOTE — ED Notes (Signed)
Pt in the middle of triage decides to go home and wait for his MD appt on the 21st. Told pt we were happy to see him if he wanted to stay. Pt denies SI or HI.

## 2019-09-19 ENCOUNTER — Encounter: Payer: Self-pay | Admitting: Family Medicine

## 2019-09-19 ENCOUNTER — Ambulatory Visit (INDEPENDENT_AMBULATORY_CARE_PROVIDER_SITE_OTHER): Payer: Self-pay | Admitting: Family Medicine

## 2019-09-19 ENCOUNTER — Other Ambulatory Visit: Payer: Self-pay

## 2019-09-19 DIAGNOSIS — R519 Headache, unspecified: Secondary | ICD-10-CM

## 2019-09-19 DIAGNOSIS — F411 Generalized anxiety disorder: Secondary | ICD-10-CM

## 2019-09-19 DIAGNOSIS — R42 Dizziness and giddiness: Secondary | ICD-10-CM

## 2019-09-19 MED ORDER — ESCITALOPRAM OXALATE 5 MG/5ML PO SOLN: 10.0000 mg | Freq: Every day | ORAL | 1 refills | Status: DC

## 2019-09-19 NOTE — Patient Instructions (Addendum)
Start Lexapro liquid 104mL = 10mg  once daily every day We had issue as you know with muscle relaxant liquid form, you can crush those tablets if you decide to take them in future, or we can consider a headache nerve pain medicine gabapentin in liquid.  [facility] - Neurology Dept [place], [place] [address] Phone: [phone]  Stay tuned for Neurologist apt. For further evaluation.  If you want to see Chiropractor you can call to schedule w/ them or call us for referral, let me know which one.  Also if you feel anxiety is not improving on lexapro can increase dose from 10 up to 20mg  in future, wait 3-4 weeks first, and also we can consider Psychologist or therapist if needed.  [facility] [place] [address] [place], [place] [address] Phone: [phone]  [employer], available walk-in 9am-4pm M-F [place], [place] [address] Hours: [place] (M-F, walk in available) [phone]  Please schedule a Follow-up Appointment to: Return in about 3 months (around 12/18/2019) for 3 month follow-up Anxiety, med adjust / headaches / neuro w/ new provider.  If you have any other questions or concerns, please feel free to call the office or send a message through [place]. You may also schedule an earlier appointment if necessary.  Additionally, you may be receiving a survey about your experience at our office within a few days to 1 week by e-mail or mail. We value your feedback.  Nobie Putnam, DO [place]

## 2019-09-19 NOTE — Progress Notes (Signed)
Subjective:    Patient ID: Timothy [name], male    DOB: [birth year], 24 y.o.   MRN: [id number]  Timothy [name] is a 24 y.o. male presenting on 09/19/2019 for Panic Attack (intermittent panic attacks that causes him to wake up gasping for air. ) and Numbness (tingling sensation that radiates all over his body. He state it feels like a surge sensation that radiates all over. The episodes only last for seconds, but comes and goes. He also reports a head injury x 1 mth ago and his mother is concern thes symptoms could be related. )  Virtual / Telehealth Encounter - Video Visit via [email or web address] The purpose of this virtual visit is to provide medical care while limiting exposure to the novel coronavirus (COVID19) for both patient and office staff.  Consent was obtained for remote visit:  Yes.   Answered questions that patient had about telehealth interaction:  Yes.   I discussed the limitations, risks, security and privacy concerns of performing an evaluation and management service by video/telephone. I also discussed with the patient that there may be a patient responsible charge related to this service. The patient expressed understanding and agreed to proceed.  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 He did  have several days of dizziness episodes. Says he has had dizziness episodes over past several YEARS, not just recent, seems to wake up next day with panic attack. No panic attacks since last year. Says he has history of depression affecting his anxiety. He has improved hydration that has helped. Several nights x 2 recently he has had issue with wake up overnight difficulty breathing dyspnea Admits sinus congestion with difficulty breathing He had episode of "body paused" and then he has had episodes of acute sharp pain in head and L ear pain to touch and generalized tingling sensation paresthesia Admits episodic headaches switching from R to L side of head Admits associated symptoms in Left shoulder blade pain Admits feeling of vibratory sensation in L shoulder and also in head History of migraine headaches age 65-20s He did have a massage with relief  - he asks for Chiropractor but not quite ready to start this yet. He is asking about head imaging with CT or MRI Cannot swallow pills. He can only take liquids. He vomits pills up if he takes them.  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 Last test 08/20/19 was NEGATIVE.  Depression screen [facility] 2/9 09/19/2019 07/19/2017 08/04/2015  Decreased Interest 0 3 0  Down, Depressed, Hopeless 0 2 0  PHQ - 2 Score 0 5 0  Altered sleeping 2 3 -  Tired, decreased energy 0 3 -  Change in appetite 0 2 -  Feeling bad or failure about yourself  0 3 -  Trouble concentrating 0 0 -  Moving slowly or fidgety/restless 0  2 -  Suicidal thoughts 0 0 -  PHQ-9 Score 2 18 -  Difficult doing work/chores Not difficult at all Somewhat difficult -   GAD 7 : Generalized Anxiety Score 09/19/2019 07/19/2017  Nervous, Anxious, on Edge 2 1  Control/stop worrying 2 1  Worry too much - different things 2 2  Trouble relaxing 3 0  Restless 3 2  Easily annoyed or irritable 1 2  Afraid - awful might happen 1 0  Total GAD 7 Score 14 8  Anxiety Difficulty Not difficult at all Somewhat  difficult     Past Medical History:  Diagnosis Date  . Allergy   . Depression   . Diverticulitis   . Dyspnea   . Fainting spell    in middle school  . Family history of bone cancer   . Family history of breast cancer    Past Surgical History:  Procedure Laterality Date  . COLECTOMY WITH COLOSTOMY CREATION/HARTMANN PROCEDURE N/A 07/22/2018   Procedure: COLECTOMY WITH COLOSTOMY CREATION/HARTMANN PROCEDURE;  Surgeon: Fredirick Maudlin, MD;  Location: [facility] ORS;  Service: General;  Laterality: N/A;  . COLONOSCOPY WITH PROPOFOL N/A 11/01/2018   Procedure: COLONOSCOPY WITH PROPOFOL;  Surgeon: [name], MD;  Location: [facility] ENDOSCOPY;  Service: Gastroenterology;  Laterality: N/A;  . COLOSTOMY REVERSAL N/A 03/22/2019   Procedure: COLOSTOMY REVERSAL;  Surgeon: Fredirick Maudlin, MD;  Location: [facility] ORS;  Service: General;  Laterality: N/A;   Social History   Socioeconomic History  . Marital status: Single    Spouse name: Not on file  . Number of children: Not on file  . Years of education: Not on file  . Highest education level: Not on file  Occupational History  . Not on file  Tobacco Use  . Smoking status: Current Some Day Smoker    Packs/day: 0.25    Types: Cigarettes    Last attempt to quit: 01/30/2019    Years since quitting: 0.6  . Smokeless tobacco: Current User    Types: Snuff  . Tobacco comment: pt smoke pack a week  Substance and Sexual Activity  . Alcohol use: Yes    Alcohol/week: 2.0 standard drinks    Types: 2 Cans of beer per week    Comment: occassionally  . Drug use: No  . Sexual activity: Not on file  Other Topics Concern  . Not on file  Social History Narrative  . Not on file   Social Determinants of Health   Financial Resource Strain:   . Difficulty of Paying Living Expenses: Not on file  Food Insecurity:   . Worried About Charity fundraiser in the Last Year: Not on file  . Ran Out of Food in the Last Year: Not on file  Transportation Needs:   .  Lack of Transportation (Medical): Not on file  . Lack of Transportation (Non-Medical): Not on file  Physical Activity:   . Days of Exercise per Week: Not on file  . Minutes of Exercise per Session: Not on file  Stress:   . Feeling of Stress : Not on file  Social Connections:   . Frequency of Communication with Friends and Family: Not on file  . Frequency of Social Gatherings with Friends and Family: Not on file  . Attends Religious Services: Not on file  . Active Member of Clubs or Organizations: Not on file  . Attends Archivist Meetings: Not on file  . Marital Status: Not on file  Intimate Partner Violence:   .  Fear of Current or Ex-Partner: Not on file  . Emotionally Abused: Not on file  . Physically Abused: Not on file  . Sexually Abused: Not on file   Family History  Problem Relation Age of Onset  . Heart disease Maternal Grandmother   . Diabetes Maternal Grandmother   . Cancer Maternal Grandmother        breast cancer  . Alport syndrome Maternal Grandmother   . Cancer Paternal Grandfather        Bone cancer  . Healthy Sister   . Alport syndrome Maternal Aunt   . Depression Mother   . Cervical cancer Mother    No current outpatient medications on file prior to visit.   No current facility-administered medications on file prior to visit.    Review of Systems Per HPI unless specifically indicated above      Objective:    There were no vitals taken for this visit.  Wt Readings from Last 3 Encounters:  08/29/19 263 lb (119.3 kg)  04/23/19 250 lb 9.6 oz (113.7 kg)  04/18/19 244 lb (110.7 kg)    Physical Exam   Note examination was completely remotely via video observation objective data only  Gen - well-appearing, no acute distress or apparent pain, comfortable HEENT - eyes appear clear without discharge or redness Heart/Lungs - cannot examine virtually - observed no evidence of coughing or labored breathing. Skin - face visible today- no  rash Neuro - awake, alert, oriented Psych - mildly anxious appearing  I have personally reviewed the radiology report from 08/29/19.  CLINICAL DATA:  Dizziness  EXAM: PORTABLE CHEST 1 VIEW  COMPARISON:  08/28/2017  FINDINGS: The heart size and mediastinal contours are within normal limits. Both lungs are clear. The visualized skeletal structures are unremarkable.  IMPRESSION: No active disease.   Electronically Signed   By: Inez Catalina M.D.   On: 08/29/2019 12:15  Results for orders placed or performed during the hospital encounter of [id number]  Basic metabolic panel  Result Value Ref Range   Sodium 137 135 - 145 mmol/L   Potassium 3.9 3.5 - 5.1 mmol/L   Chloride 105 98 - 111 mmol/L   CO2 25 22 - 32 mmol/L   Glucose, Bld 100 (H) 70 - 99 mg/dL   BUN 9 6 - 20 mg/dL   Creatinine, Ser 0.79 0.61 - 1.24 mg/dL   Calcium 9.1 8.9 - 10.3 mg/dL   GFR calc non Af Amer >60 >60 mL/min   GFR calc Af Amer >60 >60 mL/min   Anion gap 7 5 - 15  CBC  Result Value Ref Range   WBC 9.1 4.0 - 10.5 K/uL   RBC 5.24 4.22 - 5.81 MIL/uL   Hemoglobin 15.1 13.0 - 17.0 g/dL   HCT 45.5 39.0 - 52.0 %   MCV 86.8 80.0 - 100.0 fL   MCH 28.8 26.0 - 34.0 pg   MCHC 33.2 30.0 - 36.0 g/dL   RDW 12.9 11.5 - 15.5 %   Platelets 263 150 - 400 K/uL   nRBC 0.0 0.0 - 0.2 %  Urinalysis, Complete w Microscopic  Result Value Ref Range   Color, Urine COLORLESS (A) YELLOW   APPearance CLEAR (A) CLEAR   Specific Gravity, Urine 1.000 (L) 1.005 - 1.030   pH 7.0 5.0 - 8.0   Glucose, UA NEGATIVE NEGATIVE mg/dL   Hgb urine dipstick NEGATIVE NEGATIVE   Bilirubin Urine NEGATIVE NEGATIVE   Ketones, ur NEGATIVE NEGATIVE mg/dL   Protein, ur  NEGATIVE NEGATIVE mg/dL   Nitrite NEGATIVE NEGATIVE   Leukocytes,Ua NEGATIVE NEGATIVE   WBC, UA NONE SEEN 0 - 5 WBC/hpf   Bacteria, UA NONE SEEN NONE SEEN   Squamous Epithelial / LPF NONE SEEN 0 - 5  Glucose, capillary  Result Value Ref Range   Glucose-Capillary 87 70  - 99 mg/dL      Assessment & Plan:   Problem List Items Addressed This Visit    None    Visit Diagnoses    Intermittent headache    -  Primary   Relevant Medications   escitalopram (LEXAPRO) 5 MG/5ML solution   Other Relevant Orders   Ambulatory referral to Neurology   GAD (generalized anxiety disorder)       Relevant Medications   escitalopram (LEXAPRO) 5 MG/5ML solution   Episode of dizziness       Relevant Orders   Ambulatory referral to Neurology      Clinically consistent with constellation of episodic symptoms, seems some related to chronic underlying anxiety component, history of panic disorder as well but none recently, seems to have been controlled also mood has been controlled. He is not on any medication for anxiety or therapist counseling currently. - Recent head injury fall accident seems to not have had major traumatic impact but seems may be linked to some headache symptoms based on his report and timeline - Concern episodic headaches seem to have some systemic symptoms with "pausing" or questionable absence episodes and some vibration / paresthesia vs dizziness symptoms episodic as well, again not clearly consistent with acute panic attack, but it is entirely possible.  Already evaluated in ED on 08/29/19, next visit 09/16/19 he left before seen by EDP. Reviewed prior lab work from 08/29/19, had Chest X-ray Lab and Urine, overall no significant concern.  He is asking about further work up and imaging to rule things out with CT / MRI Discussed that at this point we would start with treatments to see course of symptoms and if improve, if possibly related to anxiety or MSK cause with neck/shoulder/headache.  Start Lexapro 10mg  (25mL) daily, confirmed w/ [facility] have it, can take for 3-4 weeks to notice if benefit then can adjust to 20mg  dose if indicated or needed, likely continue for several months  Symptoms may ultimately be related to underlying anxiety  and panic  Additionally offered muscle relaxant as well, since he has improved w/ MSK treatment, possibly nerve impingement would explain paresthesia or some of his symptoms. However unable to identify a liquid form solution muscle relaxant, discussed with pharmacist as well. Also offered Gabapentin liquid, or crush oral baclofen pill, patient declined.  He may f/u with Chiropractor PRN or contact us for referral if requested  Referral sent to [facility]rology for consultation on his constellation of symptoms headaches and associated features.   Meds ordered this encounter  Medications  . escitalopram (LEXAPRO) 5 MG/5ML solution    Sig: Take 10 mLs (10 mg total) by mouth daily.    Dispense:  300 mL    Refill:  1     Follow up plan: Return in about 3 months (around 12/18/2019) for 3 month follow-up Anxiety, med adjust / headaches / neuro w/ new provider.    Patient verbalizes understanding with the above medical recommendations including the limitation of remote medical advice.  Specific follow-up and call-back criteria were given for patient to follow-up or seek medical care more urgently if needed.  Total duration of direct patient  care provided via video conference: 38 minutes   Nobie Putnam, [facility] 09/19/2019, 9:23 AM

## 2019-10-16 ENCOUNTER — Telehealth: Payer: Self-pay | Admitting: Nurse Practitioner

## 2019-10-16 DIAGNOSIS — F411 Generalized anxiety disorder: Secondary | ICD-10-CM

## 2019-10-16 MED ORDER — ESCITALOPRAM OXALATE 5 MG/5ML PO SOLN: 20.0000 mg | Freq: Every day | ORAL | 1 refills | Status: DC

## 2019-10-16 NOTE — Telephone Encounter (Signed)
Pt is requesting refill on lexpro generic for 20 mg  Liquid called into  [facility]

## 2019-10-16 NOTE — Telephone Encounter (Signed)
Adjusted dose, new rx sent 20mg  liquid to pharmacy.  He was asked to follow-up with new provider to establish within next 3 months from his last apt.  Timothy Putnam, DO [facility] 10/16/2019, 12:28 PM

## 2019-12-09 ENCOUNTER — Other Ambulatory Visit: Payer: Self-pay

## 2019-12-09 ENCOUNTER — Emergency Department
Admission: EM | Admit: 2019-12-09 | Discharge: 2019-12-09 | Disposition: A | Discharge status: Home / Self Care | Payer: BC Managed Care – PPO | Attending: Emergency Medicine | Admitting: Emergency Medicine

## 2019-12-09 DIAGNOSIS — R61 Generalized hyperhidrosis: Secondary | ICD-10-CM | POA: Diagnosis not present

## 2019-12-09 DIAGNOSIS — R55 Syncope and collapse: Secondary | ICD-10-CM | POA: Diagnosis not present

## 2019-12-09 DIAGNOSIS — Z79899 Other long term (current) drug therapy: Secondary | ICD-10-CM | POA: Insufficient documentation

## 2019-12-09 DIAGNOSIS — R064 Hyperventilation: Secondary | ICD-10-CM | POA: Insufficient documentation

## 2019-12-09 DIAGNOSIS — F17210 Nicotine dependence, cigarettes, uncomplicated: Secondary | ICD-10-CM | POA: Diagnosis not present

## 2019-12-09 LAB — BASIC METABOLIC PANEL
Anion gap: 9 (ref 5–15)
BUN: 15 mg/dL (ref 6–20)
CO2: 25 mmol/L (ref 22–32)
Calcium: 9.1 mg/dL (ref 8.9–10.3)
Chloride: 106 mmol/L (ref 98–111)
Creatinine, Ser: 0.96 mg/dL (ref 0.61–1.24)
GFR calc Af Amer: >60 mL/min (ref >60)
GFR calc non Af Amer: >60 mL/min (ref >60)
Glucose, Bld: 101 mg/dL — ABNORMAL HIGH (ref 70–99)
Potassium: 3.9 mmol/L (ref 3.5–5.1)
Sodium: 140 mmol/L (ref 135–145)

## 2019-12-09 LAB — CBC
HCT: 47.2 % (ref 39.0–52.0)
Hemoglobin: 15.8 g/dL (ref 13.0–17.0)
MCH: 29.4 pg (ref 26.0–34.0)
MCHC: 33.5 g/dL (ref 30.0–36.0)
MCV: 87.7 fL (ref 80.0–100.0)
Platelets: 269 10*3/uL (ref 150–400)
RBC: 5.38 MIL/uL (ref 4.22–5.81)
RDW: 13.2 % (ref 11.5–15.5)
WBC: 11.8 10*3/uL — ABNORMAL HIGH (ref 4.0–10.5)
nRBC: 0 % (ref 0.0–0.2)

## 2019-12-09 NOTE — ED Notes (Signed)
EDP @ bedside 

## 2019-12-09 NOTE — Discharge Instructions (Signed)
Please call the number provided for cardiology to arrange a follow-up appointment to discuss possible Holter monitor.  Return to the emergency department for any symptoms personally concerning to yourself.

## 2019-12-09 NOTE — ED Triage Notes (Signed)
Patient ambulatory to triage with EMS from home.  Per EMS patient reported becoming dizzy while using the bathroom and almost had syncopal episode going back to bed.  EMS vital - hr 69, BP 118 78, pulse oxi 99 % on room air, cbg 104.

## 2019-12-09 NOTE — ED Provider Notes (Signed)
[facility] Emergency Department Provider Note  Time seen: 7:21 AM  I have reviewed the triage vital signs and the nursing notes.   HISTORY  Chief Complaint Dizziness   HPI Timothy [name] is a 24 y.o. male with a past medical history of depression, diverticulitis status post partial colectomy, presents to the emergency department after a near syncopal episode.  According to the patient he got up early this morning around 4:00 to urinate, while urinating he began feeling very lightheaded and dizzy, went back to his bed and laid down.  Patient states he broke out into a cold sweat and began hyperventilating.  Patient does state a history of anxiety/panic attacks that have presented very similar in the past.  He states multiple times that has occurred overnight while urinating.  Denies any chest pain.  No shortness of breath at this time.  States that symptoms have since resolved.   Past Medical History:  Diagnosis Date  . Allergy   . Depression   . Diverticulitis   . Dyspnea   . Fainting spell    in middle school  . Family history of bone cancer   . Family history of breast cancer     Patient Active Problem List   Diagnosis Date Noted  . Sigmoid diverticulitis 03/22/2019  . S/P partial colectomy 01/30/2019  . Colostomy in place [facility]) 01/30/2019  . Family history of breast cancer   . Family history of bone cancer   . Tubular adenoma of colon 11/05/2018  . Diverticulitis of colon with perforation 07/21/2018  . Anxiety and depression 07/28/2017  . Other insomnia 07/28/2017  . Smoker 08/04/2015  . Obesity 08/04/2015  . Allergic rhinitis due to pollen 08/04/2015    Past Surgical History:  Procedure Laterality Date  . COLECTOMY WITH COLOSTOMY CREATION/HARTMANN PROCEDURE N/A 07/22/2018   Procedure: COLECTOMY WITH COLOSTOMY CREATION/HARTMANN PROCEDURE;  Surgeon: Fredirick Maudlin, MD;  Location: [facility] ORS;  Service: General;  Laterality: N/A;  . COLONOSCOPY  WITH PROPOFOL N/A 11/01/2018   Procedure: COLONOSCOPY WITH PROPOFOL;  Surgeon: [name], MD;  Location: [facility] ENDOSCOPY;  Service: Gastroenterology;  Laterality: N/A;  . COLOSTOMY REVERSAL N/A 03/22/2019   Procedure: COLOSTOMY REVERSAL;  Surgeon: Fredirick Maudlin, MD;  Location: [facility] ORS;  Service: General;  Laterality: N/A;    Prior to Admission medications   Medication Sig Start Date End Date Taking? Authorizing Provider  escitalopram (LEXAPRO) 5 MG/5ML solution Take 20 mLs (20 mg total) by mouth daily. 10/16/19   Karamalegos, Devonne Doughty, DO    No Known Allergies  Family History  Problem Relation Age of Onset  . Heart disease Maternal Grandmother   . Diabetes Maternal Grandmother   . Cancer Maternal Grandmother        breast cancer  . Alport syndrome Maternal Grandmother   . Cancer Paternal Grandfather        Bone cancer  . Healthy Sister   . Alport syndrome Maternal Aunt   . Depression Mother   . Cervical cancer Mother     Social History Social History   Tobacco Use  . Smoking status: Current Some Day Smoker    Packs/day: 0.25    Types: Cigarettes    Last attempt to quit: 01/30/2019    Years since quitting: 0.8  . Smokeless tobacco: Current User    Types: Snuff  . Tobacco comment: pt smoke pack a week  Substance Use Topics  . Alcohol use: Yes    Alcohol/week: 2.0 standard drinks  Types: 2 Cans of beer per week    Comment: occassionally  . Drug use: No    Review of Systems Constitutional: Negative for fever. Cardiovascular: Negative for chest pain. Respiratory: Negative for shortness of breath. Gastrointestinal: Negative for abdominal pain Genitourinary: Negative for urinary compaints Musculoskeletal: Negative for musculoskeletal complaints Neurological: Negative for headache All other ROS negative  ____________________________________________   PHYSICAL EXAM:  VITAL SIGNS: ED Triage Vitals [12/09/19 0551]  Enc Vitals Group     BP 110/73     Pulse  Rate 73     Resp 16     Temp 97.8 F (36.6 C)     Temp Source Oral     SpO2 96 %     Weight 270 lb (122.5 kg)     Height 6' (1.829 m)     Head Circumference      Peak Flow      Pain Score 0     Pain Loc      Pain Edu?      Excl. in [place]?    Constitutional: Alert and oriented. Well appearing and in no distress. Eyes: Normal exam ENT      Head: Normocephalic and atraumatic.      Mouth/Throat: Mucous membranes are moist. Cardiovascular: Normal rate, regular rhythm. No murmur Respiratory: Normal respiratory effort without tachypnea nor retractions. Breath sounds are clear Gastrointestinal: Soft and nontender. No distention. Musculoskeletal: Nontender with normal range of motion in all extremities. Neurologic:  Normal speech and language. No gross focal neurologic deficits Skin:  Skin is warm, dry and intact.  Psychiatric: Mood and affect are normal.   ____________________________________________    EKG  EKG viewed and interpreted by myself shows a normal sinus rhythm at 64 bpm with a narrow QRS, normal axis, normal intervals, no concerning ST changes.  ____________________________________________   INITIAL IMPRESSION / ASSESSMENT AND PLAN / ED COURSE  Pertinent labs & imaging results that were available during my care of the patient were reviewed by me and considered in my medical decision making (see chart for details).   Patient presents to the emergency department for a near syncopal episode.  Patient states this occurred while urinating overnight, states it has occurred multiple times in the past while urinating as well.  He also states a history of panic disorder and states it felt somewhat similar to that.  Differential would include syncope, vasovagal or orthostatic syncope, micturition syncope or anxiety/panic attack.  Patient's work-up in the emergency department overall appears well, EKG is reassuring.  Physical exam is reassuring.  I discussed the differential with the  patient, he has never had a Holter monitor performed.  In an abundance of caution I do believe it would be warranted to have a Holter monitor examination to ensure no arrhythmias.  Patient agreeable to plan of care.  We will refer to cardiology.  Otherwise I discussed with the patient hydrating throughout the day, sitting on the side of his bed for several minutes before giving up to urinate if he needs to urinate overnight.  Timothy [name] was evaluated in Emergency Department on 12/09/2019 for the symptoms described in the history of present illness. He was evaluated in the context of the global COVID-19 pandemic, which necessitated consideration that the patient might be at risk for infection with the SARS-CoV-2 virus that causes COVID-19. Institutional protocols and algorithms that pertain to the evaluation of patients at risk for COVID-19 are in a state of rapid change based on information released  by regulatory bodies including the CDC and federal and state organizations. These policies and algorithms were followed during the patient's care in the ED.  ____________________________________________   FINAL CLINICAL IMPRESSION(S) / ED DIAGNOSES  Near syncope   Harvest Dark, MD 12/09/19 [phone]

## 2019-12-30 ENCOUNTER — Other Ambulatory Visit: Payer: Self-pay | Admitting: Family Medicine

## 2019-12-30 DIAGNOSIS — F411 Generalized anxiety disorder: Secondary | ICD-10-CM

## 2019-12-30 NOTE — Telephone Encounter (Signed)
Patient's apt is scheduled for tomorrow around 2:00 pm with Elmyra Ricks.

## 2019-12-30 NOTE — Telephone Encounter (Signed)
Pt is is very upset ans is over anxious for this medication. Dr Raliegh Ip received note at 3:48 and informed pt being worked on escitalopram (LEXAPRO) 5 MG/5ML solution

## 2019-12-30 NOTE — Telephone Encounter (Signed)
Requested medication (s) are due for refill today:   Yes  Requested medication (s) are on the active medication list:   Yes  Future visit scheduled:   No.  former pt of Cassell Smiles.  LOV 09/19/2019 with Dr. Parks Ranger   Last ordered: 10/16/2019  600 ml with 1 refill   Returned because there is a note from Dr. Parks Ranger mentioning him establishing with the new provider.  Requested Prescriptions  Pending Prescriptions Disp Refills   escitalopram (LEXAPRO) 5 MG/5ML solution [Pharmacy Med Name: ESCITALOPRAM OXALATE 5 MG/5 ML] 600 mL 0    Sig: Take 20 mLs (20 mg total) by mouth daily.      Psychiatry:  Antidepressants - SSRI Failed - 12/30/2019 12:37 PM      Failed - Valid encounter within last 6 months    Recent Outpatient Visits           3 months ago Intermittent headache   [facility] [place], Devonne Doughty, DO   2 years ago Anxiety and depression   [facility] Mikey College, NP   3 years ago [place], [place], [place]   3 years ago Preventative health care   [facility] [place] Hews, Genevie Cheshire, NP   4 years ago Viral gastroenteritis   [facility] [place], Genevie Cheshire, NP              Passed - Completed PHQ-2 or PHQ-9 in the last 360 days.

## 2019-12-31 ENCOUNTER — Encounter: Payer: Self-pay | Admitting: Family Medicine

## 2019-12-31 ENCOUNTER — Other Ambulatory Visit: Payer: Self-pay

## 2019-12-31 ENCOUNTER — Ambulatory Visit (INDEPENDENT_AMBULATORY_CARE_PROVIDER_SITE_OTHER): Payer: Self-pay | Admitting: Family Medicine

## 2019-12-31 VITALS — BP 117/70 | HR 89 | Temp 97.3°F | Ht 72.0 in | Wt 285.8 lb

## 2019-12-31 DIAGNOSIS — F411 Generalized anxiety disorder: Secondary | ICD-10-CM

## 2019-12-31 DIAGNOSIS — F32A Depression, unspecified: Secondary | ICD-10-CM

## 2019-12-31 DIAGNOSIS — F329 Major depressive disorder, single episode, unspecified: Secondary | ICD-10-CM

## 2019-12-31 DIAGNOSIS — F419 Anxiety disorder, unspecified: Secondary | ICD-10-CM

## 2019-12-31 DIAGNOSIS — R7309 Other abnormal glucose: Secondary | ICD-10-CM

## 2019-12-31 DIAGNOSIS — R42 Dizziness and giddiness: Secondary | ICD-10-CM | POA: Insufficient documentation

## 2019-12-31 LAB — POCT URINALYSIS DIPSTICK
Bilirubin, UA: NEGATIVE
Blood, UA: NEGATIVE
Glucose, UA: NEGATIVE
Ketones, UA: NEGATIVE
Leukocytes, UA: NEGATIVE
Nitrite, UA: NEGATIVE
Protein, UA: NEGATIVE
Spec Grav, UA: <=1.005 — AB (ref 1.010–1.025)
Urobilinogen, UA: 0.2 E.U./dL
pH, UA: 5 (ref 5.0–8.0)

## 2019-12-31 LAB — POCT GLYCOSYLATED HEMOGLOBIN (HGB A1C): Hemoglobin A1C: 5.4 % (ref 4.0–5.6)

## 2019-12-31 MED ORDER — ESCITALOPRAM OXALATE 5 MG/5ML PO SOLN: 20.0000 mg | Freq: Every day | ORAL | 1 refills | Status: DC

## 2019-12-31 NOTE — Assessment & Plan Note (Signed)
Lightheadedness with presyncope.  Evaluated in the ER approx 2 weeks ago and advised to follow up with cardiology for further work up.  Discussed with patient, slow positional changes is best, since changing positions quickly can alter our blood pressure for a few moments and make [place] feel dizzy/lightheaded.  Also discussed if is changing positions slowly, may find sitting down to urinate may not exacerbate his symptoms of dizziness/feeling lightheaded.  Referral placed to cardiology, as ER discussed.  Patient requesting to see cardiologist his grandmother has seen in the past.  Plan: 1. Change positions slowly 2. Increase water intake 3. To sit down when urinating in the middle of the night 4. Referral for cardiology placed

## 2019-12-31 NOTE — Progress Notes (Signed)
Subjective:    Patient ID: Timothy [name], male    DOB: 12-16-1995, 24 y.o.   MRN: [id number]  Timothy T Tomei is a 24 y.o. male presenting on 12/31/2019 for Anxiety and Hyperglycemia   HPI  Mr. Berquist presents to clinic for refill on his lexapro.  Reports that he has been without medication and has found that his anxiety has increased.  States he has been having episodes where he gets up quickly in the middle of the night to use the restroom, when he begins urinating, he feels dizzy/lightheaded, will stop himself from urinating and he runs back to bed to avoid passing out.  This happened approximately 2 weeks ago and he called 911 for a transport to the emergency room.  States this feeling happens in the past when he has quick positional changes and has believed it may have been related to having decreased blood sugar..  Reports this did happen once in the past and he did have full LOC, awoke on the bathroom floor.  Was told by ER MD to follow up with cardiology, but appointment has not yet been scheduled.  Would like to follow with the cardiologist his grandmother sees with [facility]. Denies any cardiac history, history of diabetes.    Depression screen [facility] 2/9 12/31/2019 09/19/2019 07/19/2017  Decreased Interest 1 0 3  Down, Depressed, Hopeless 0 0 2  PHQ - 2 Score 1 0 5  Altered sleeping 0 2 3  Tired, decreased energy 1 0 3  Change in appetite 3 0 2  Feeling bad or failure about yourself  0 0 3  Trouble concentrating 0 0 0  Moving slowly or fidgety/restless 0 0 2  Suicidal thoughts 0 0 0  PHQ-9 Score 5 2 18   Difficult doing work/chores Not difficult at all Not difficult at all Somewhat difficult    Social History   Tobacco Use  . Smoking status: Current Some Day Smoker    Packs/day: 0.25    Types: Cigarettes  . Smokeless tobacco: Never Used  . Tobacco comment: pt smoke pack a week  Substance Use Topics  . Alcohol use: Yes    Alcohol/week: 2.0 standard drinks    Types: 2 Cans of beer  per week    Comment: occassionally  . Drug use: No    Review of Systems Per HPI unless specifically indicated above     Objective:    BP 117/70 (BP Location: Left Arm, Patient Position: Sitting, Cuff Size: Large)   Pulse 89   Temp (!) 97.3 F (36.3 C) (Temporal)   Ht 6' (1.829 m)   Wt 285 lb 12.8 oz (129.6 kg)   BMI 38.76 kg/m   Wt Readings from Last 3 Encounters:  12/31/19 285 lb 12.8 oz (129.6 kg)  12/09/19 270 lb (122.5 kg)  08/29/19 263 lb (119.3 kg)    Physical Exam  Results for orders placed or performed in visit on 12/31/19  POCT glycosylated hemoglobin (Hb A1C)  Result Value Ref Range   Hemoglobin A1C 5.4 4.0 - 5.6 %   HbA1c POC (<> result, manual entry)     HbA1c, POC (prediabetic range)     HbA1c, POC (controlled diabetic range)    POCT Urinalysis Dipstick  Result Value Ref Range   Color, UA Yellow    Clarity, UA clear    Glucose, UA Negative Negative   Bilirubin, UA Negative    Ketones, UA Negative    Spec Grav, UA <=1.005 (A) 1.010 -  1.025   Blood, UA negative    pH, UA 5.0 5.0 - 8.0   Protein, UA Negative Negative   Urobilinogen, UA 0.2 0.2 or 1.0 E.U./dL   Nitrite, UA negative    Leukocytes, UA Negative Negative   Appearance     Odor        Assessment & Plan:   Problem List Items Addressed This Visit      Other   Anxiety and depression    Currently unmedicated for anxiety due to running out of refills on his medications.  Increased anxiety at this time.  Discussed concerns of fight/flight that occur when he has quick positional changes that can increase his anxiety/panic.  Discussed how foods can stimulate his CN 10 and may find that his overeating is contributing to his anxiety as well as his smoking and ETOH intake.  Plan: 1. Restart escitalopram 20mg  daily, via liquid.  Patient unable to tolerate pills/capsules 2. Decrease vaping and cigarette intake as well as ETOH intake 3. Return to clinic in 1 month for follow up appointment.       Relevant Medications   escitalopram (LEXAPRO) 5 MG/5ML solution   Lightheadedness - Primary    Lightheadedness with presyncope.  Evaluated in the ER approx 2 weeks ago and advised to follow up with cardiology for further work up.  Discussed with patient, slow positional changes is best, since changing positions quickly can alter our blood pressure for a few moments and make [place] feel dizzy/lightheaded.  Also discussed if is changing positions slowly, may find sitting down to urinate may not exacerbate his symptoms of dizziness/feeling lightheaded.  Referral placed to cardiology, as ER discussed.  Patient requesting to see cardiologist his grandmother has seen in the past.  Plan: 1. Change positions slowly 2. Increase water intake 3. To sit down when urinating in the middle of the night 4. Referral for cardiology placed       Relevant Orders   Ambulatory referral to Cardiology    Other Visit Diagnoses    Elevated glucose       Relevant Orders   POCT glycosylated hemoglobin (Hb A1C) (Completed)   POCT Urinalysis Dipstick (Completed)   GAD (generalized anxiety disorder)       Relevant Medications   escitalopram (LEXAPRO) 5 MG/5ML solution      Meds ordered this encounter  Medications  . escitalopram (LEXAPRO) 5 MG/5ML solution    Sig: Take 20 mLs (20 mg total) by mouth daily.    Dispense:  600 mL    Refill:  1      Follow up plan: Return in about 4 weeks (around 01/28/2020) for Anxiety follow up.   Harlin Rain, [place] [facility] Nurse Practitioner [facility] 12/31/2019, 2:09 PM

## 2019-12-31 NOTE — Assessment & Plan Note (Signed)
Currently unmedicated for anxiety due to running out of refills on his medications.  Increased anxiety at this time.  Discussed concerns of fight/flight that occur when he has quick positional changes that can increase his anxiety/panic.  Discussed how foods can stimulate his CN 10 and may find that his overeating is contributing to his anxiety as well as his smoking and ETOH intake.  Plan: 1. Restart escitalopram 20mg  daily, via liquid.  Patient unable to tolerate pills/capsules 2. Decrease vaping and cigarette intake as well as ETOH intake 3. Return to clinic in 1 month for follow up appointment.

## 2019-12-31 NOTE — Patient Instructions (Signed)
I have put in a referral to cardiology.  If you do not have a call within 1 week to contact us and we will follow up with the referral coordinator.  Your medication refills have been sent to your pharmacy on file.  We will plan to see you back in 1 month for follow up on anxiety  You will receive a survey after today's visit either digitally by e-mail or paper by [employer]. Your experiences and feedback matter to [place].  Please respond so we know how we are doing as we provide care for you.  Call us with any questions/concerns/needs.  It is my goal to be available to you for your health concerns.  Thanks for choosing me to be a partner in your healthcare needs!  Harlin Rain, FNP-C Family Nurse Practitioner [facility] Phone: [phone]

## 2020-01-28 ENCOUNTER — Ambulatory Visit (INDEPENDENT_AMBULATORY_CARE_PROVIDER_SITE_OTHER): Payer: BC Managed Care – PPO | Admitting: Family Medicine

## 2020-01-28 ENCOUNTER — Encounter: Payer: Self-pay | Admitting: Family Medicine

## 2020-01-28 ENCOUNTER — Other Ambulatory Visit: Payer: Self-pay

## 2020-01-28 VITALS — BP 117/64 | Temp 97.7°F | Ht 72.0 in | Wt 286.2 lb

## 2020-01-28 DIAGNOSIS — G479 Sleep disorder, unspecified: Secondary | ICD-10-CM

## 2020-01-28 DIAGNOSIS — F329 Major depressive disorder, single episode, unspecified: Secondary | ICD-10-CM | POA: Diagnosis not present

## 2020-01-28 DIAGNOSIS — F419 Anxiety disorder, unspecified: Secondary | ICD-10-CM | POA: Diagnosis not present

## 2020-01-28 DIAGNOSIS — F411 Generalized anxiety disorder: Secondary | ICD-10-CM

## 2020-01-28 DIAGNOSIS — F32A Depression, unspecified: Secondary | ICD-10-CM

## 2020-01-28 MED ORDER — ESCITALOPRAM OXALATE 5 MG/5ML PO SOLN: 20.0000 mg | Freq: Every day | ORAL | 1 refills | Status: DC

## 2020-01-28 NOTE — Assessment & Plan Note (Signed)
Likely r/t to poor sleep hygiene.  Reports watching TV and/or playing on phone while in bed.  Discussed importance of setting a sleep hygiene routine, including avoiding all screen time/blue light for approx 2 hours before bed.  Sleep hygiene handout provided.  Reviewed over the counter melatonin gummies, if would like to try.  Plan: 1. Work on sleep hygiene, referring to hand out provided 2. Can try over the counter melatonin gummies, according to packaging directions, as needed for sleep 3. Return to clinic in 3 months, or sooner, should the need arise

## 2020-01-28 NOTE — Assessment & Plan Note (Signed)
Currently stable and tolerating his prescription for escitalopram.  Tolerating well without known side effects.  Will continue prescription.  Plan: 1. Continue escitalopram 20mg  daily. 2. Mood handout provided 3. Follow up in 3 months

## 2020-01-28 NOTE — Patient Instructions (Addendum)
Look below at information on balancing mood and for sleep hygiene.  The following recommendations are helpful adjuncts for helping rebalance your mood.  Eat a nourishing diet. Ensure adequate intake of calories, protein, carbs, fat, vitamins, and minerals. Prioritize whole foods at each meal, including meats, vegetables, fruits, nuts and seeds, etc.   Avoid inflammatory and/or "junk" foods, such as sugar, omega-6 fats, refined grains, chemicals, and preservatives are common in packaged and prepared foods. Minimize or completely avoid these ingredients and stick to whole foods with little to no additives. Cook from scratch as much as possible for more control over what you eat  Get enough sleep. Poor sleep is significantly associated with depression and anxiety. Make 7-9 hours of sleep nightly a top priority  Exercise appropriately. Exercise is known to improve brain functioning and boost mood. Aim for 30 minutes of daily physical activity. Avoid "overtraining," which can cause mental disturbances  Assess your light exposure. Not enough natural light during the day and too much artificial light can have a major impact on your mood. Get outside as often as possible during daylight hours. Minimize light exposure after dark and avoid the use of electronics that give off blue light before bed  Manage your stress.  Use daily stress management techniques such as meditation, yoga, or mindfulness to retrain your brain to respond differently to stress. Try deep breathing to deactivate your "fight or flight" response.  There are many of sources with apps like Headspace, Calm or a variety of YouTube videos (videos from Gwynne Edinger have guided meditation)  Prioritize your social life. Work on building social support with new friends or improve current relationships. Consider getting a pet that allows for companionship, social interaction, and physical touch. Try volunteering or joining a faith-based  community to increase your sense of purpose  4-7-8 breathing technique at bedtime: breathe in to count of 4, hold breath for count of 7, exhale for count of 8; do 3-5 times for letting go of overactive thoughts  Take time to play Unstructured "play" time can help reduce anxiety and depression Options for play include music, games, sports, dance, art, etc.  Try to add daily omega 3 fatty acids, magnesium, B complex, and balanced amino acid supplements to help improve mood and anxiety.  Sleep hygiene is the single most effective treatment for sleep issues, but it is hard work.  Tips for a good night's sleep:  -Keep sleep environment comfortable and conducive to sleep -Keep regular sleep schedule 7 nights a week -Avoiding naps during the day -Avoiding going to bed until drowsy and ready to sleep, not trying to sleep, and not watching the clock -Get out of bed if not asleep within 15-20 minutes and returning only when drowsy -Avoiding caffeine, nicotine, alcohol, and other substances that interfere with sleep before bedtime -Take an hour before your set bedtime and start to wind down: bath/shower, no more TV or phone (the blue light can interfere with sleeping), listen to soothing music, or meditation -No TV in your bedroom -Exercising regularly, at least 6 hours before sleep. Yoga and Tai Chi can improve sleep quality  There are a lot of books and apps that may help guide you with any of the following:   -Progressive muscle relaxation (involves methodical tension and relaxation of different Muscle groups throughout body)  Guided imagery  -YouTube - Gwynne Edinger has free videos on YouTube that can help with meditation and some   Abdominal breathing   Over the counter  sleep aid one hour before bed- and gradually wean your use over 2-4 weeks  Some examples are : *Melatonin 5-10 mg *Sleepology (Can find on [employer]) taken according to packaging directions  There are a few online  evidence based online programs, unfortunately they are not free.   Developed by a sleep expert who created a drug-free program for insomnia proven more effective than sleeping pills.  www.cbtforinsomnia.com Sleepio is an evidence-based digital sleep improvement program   www.sleepio.com SHUTi is designed to actively help retrain your body and mind for great sleep through six engaging Cognitive Behavioral Therapy for Insomnia strategy and learning sessions  [email or web address]  We will plan to see you back in 3 months for depression follow up  You will receive a survey after today's visit either digitally by e-mail or paper by [employer]. Your experiences and feedback matter to [place].  Please respond so we know how we are doing as we provide care for you.  Call us with any questions/concerns/needs.  It is my goal to be available to you for your health concerns.  Thanks for choosing me to be a partner in your healthcare needs!  Harlin Rain, FNP-C Family Nurse Practitioner [facility] Phone: [phone]

## 2020-01-28 NOTE — Progress Notes (Signed)
Subjective:    Patient ID: Timothy [name], male    DOB: [birth year], 24 y.o.   MRN: [id number]  Timothy T Burnsworth is a 24 y.o. male presenting on 01/28/2020 for Anxiety and Sleeping Problem (pt complain of sleeping more than usual)   HPI  Mr. Murk presents to clinic for re-evaluation on his anxiety and new concern of sleeping problems.  Reports that he has restarted his escitalopram and believes that it has helped decrease his anxiety.  Is tolerating well without known side effects.  Has concerns for sleep problems.  Reports that he is having difficulty with falling asleep some nights, can stay up all night watching TV or using his cell phone, and then will sleep during the day.  Denies any known snoring, waking up choking/gasping, being treated for hypertension.  Does not have a set sleep hygiene routine.  Has not tried anything over the counter for his symptoms.   Depression screen [facility] 2/9 01/28/2020 12/31/2019 09/19/2019  Decreased Interest 1 1 0  Down, Depressed, Hopeless 0 0 0  PHQ - 2 Score 1 1 0  Altered sleeping 3 0 2  Tired, decreased energy 1 1 0  Change in appetite 0 3 0  Feeling bad or failure about yourself  0 0 0  Trouble concentrating 0 0 0  Moving slowly or fidgety/restless 0 0 0  Suicidal thoughts 0 0 0  PHQ-9 Score 5 5 2   Difficult doing work/chores Somewhat difficult Not difficult at all Not difficult at all    Social History   Tobacco Use  . Smoking status: Current Some Day Smoker    Packs/day: 0.25    Types: Cigarettes  . Smokeless tobacco: Never Used  . Tobacco comment: pt smoke pack a week  Substance Use Topics  . Alcohol use: Yes    Alcohol/week: 2.0 standard drinks    Types: 2 Cans of beer per week    Comment: occassionally  . Drug use: No    Review of Systems  Constitutional: Negative.   HENT: Negative.   Eyes: Negative.   Respiratory: Negative.   Cardiovascular: Negative.   Gastrointestinal: Negative.   Endocrine: Negative.   Genitourinary:  Negative.   Musculoskeletal: Negative.   Skin: Negative.   Allergic/Immunologic: Negative.   Neurological: Negative.   Hematological: Negative.   Psychiatric/Behavioral: Positive for sleep disturbance. Negative for agitation, behavioral problems, confusion, decreased concentration, dysphoric mood, hallucinations, self-injury and suicidal ideas. The patient is not nervous/anxious and is not hyperactive.    Per HPI unless specifically indicated above     Objective:    BP 117/64 (BP Location: Right Arm, Patient Position: Sitting, Cuff Size: Large)   Temp 97.7 F (36.5 C) (Temporal)   Ht 6' (1.829 m)   Wt 286 lb 3.2 oz (129.8 kg)   BMI 38.82 kg/m   Wt Readings from Last 3 Encounters:  01/28/20 286 lb 3.2 oz (129.8 kg)  12/31/19 285 lb 12.8 oz (129.6 kg)  12/09/19 270 lb (122.5 kg)    Physical Exam Vitals reviewed.  Constitutional:      General: He is not in acute distress.    Appearance: Normal appearance. He is well-developed and well-groomed. He is obese. He is not ill-appearing or toxic-appearing.  HENT:     Head: Normocephalic.     Nose:     Comments: Lizbeth Bark is in place, covering mouth and nose Eyes:     General: Lids are normal. Vision grossly intact.  Right eye: No discharge.        Left eye: No discharge.     Extraocular Movements: Extraocular movements intact.     Conjunctiva/sclera: Conjunctivae normal.     Pupils: Pupils are equal, round, and reactive to light.  Cardiovascular:     Rate and Rhythm: Normal rate and regular rhythm.     Pulses: Normal pulses.     Heart sounds: Normal heart sounds. No murmur. No friction rub. No gallop.   Pulmonary:     Effort: Pulmonary effort is normal. No respiratory distress.     Breath sounds: Normal breath sounds.  Skin:    General: Skin is warm and dry.     Capillary Refill: Capillary refill takes less than 2 seconds.  Neurological:     General: No focal deficit present.     Mental Status: He is alert and oriented  to person, place, and time.     Cranial Nerves: No cranial nerve deficit.     Sensory: No sensory deficit.     Motor: No weakness.     Coordination: Coordination normal.     Gait: Gait normal.  Psychiatric:        Attention and Perception: Attention and perception normal.        Mood and Affect: Mood and affect normal.        Speech: Speech normal.        Behavior: Behavior normal. Behavior is cooperative.        Thought Content: Thought content normal.        Cognition and Memory: Cognition and memory normal.        Judgment: Judgment normal.     Results for orders placed or performed in visit on 12/31/19  POCT glycosylated hemoglobin (Hb A1C)  Result Value Ref Range   Hemoglobin A1C 5.4 4.0 - 5.6 %   HbA1c POC (<> result, manual entry)     HbA1c, POC (prediabetic range)     HbA1c, POC (controlled diabetic range)    POCT Urinalysis Dipstick  Result Value Ref Range   Color, UA Yellow    Clarity, UA clear    Glucose, UA Negative Negative   Bilirubin, UA Negative    Ketones, UA Negative    Spec Grav, UA <=1.005 (A) 1.010 - 1.025   Blood, UA negative    pH, UA 5.0 5.0 - 8.0   Protein, UA Negative Negative   Urobilinogen, UA 0.2 0.2 or 1.0 E.U./dL   Nitrite, UA negative    Leukocytes, UA Negative Negative   Appearance     Odor        Assessment & Plan:   Problem List Items Addressed This Visit      Other   Anxiety and depression - Primary    Currently stable and tolerating his prescription for escitalopram.  Tolerating well without known side effects.  Will continue prescription.  Plan: 1. Continue escitalopram 20mg  daily. 2. Mood handout provided 3. Follow up in 3 months      Relevant Medications   escitalopram (LEXAPRO) 5 MG/5ML solution   Sleep disturbance    Likely r/t to poor sleep hygiene.  Reports watching TV and/or playing on phone while in bed.  Discussed importance of setting a sleep hygiene routine, including avoiding all screen time/blue light for  approx 2 hours before bed.  Sleep hygiene handout provided.  Reviewed over the counter melatonin gummies, if would like to try.  Plan: 1. Work on sleep hygiene, referring to  hand out provided 2. Can try over the counter melatonin gummies, according to packaging directions, as needed for sleep 3. Return to clinic in 3 months, or sooner, should the need arise       Other Visit Diagnoses    GAD (generalized anxiety disorder)       Relevant Medications   escitalopram (LEXAPRO) 5 MG/5ML solution      Meds ordered this encounter  Medications  . escitalopram (LEXAPRO) 5 MG/5ML solution    Sig: Take 20 mLs (20 mg total) by mouth daily.    Dispense:  600 mL    Refill:  1      Follow up plan: Return in about 3 months (around 04/29/2020) for Depression f/u and sleep hygiene.   Harlin Rain, [place] [facility] Nurse Practitioner [facility] 01/28/2020, 11:17 AM

## 2020-02-28 DIAGNOSIS — R42 Dizziness and giddiness: Secondary | ICD-10-CM

## 2020-02-28 HISTORY — DX: Syncope and collapse: R42

## 2020-05-01 ENCOUNTER — Ambulatory Visit: Payer: BC Managed Care – PPO | Admitting: Family Medicine

## 2020-05-12 ENCOUNTER — Ambulatory Visit: Payer: Self-pay | Admitting: Family Medicine

## 2020-06-02 ENCOUNTER — Other Ambulatory Visit: Payer: Self-pay | Admitting: Family Medicine

## 2020-06-02 DIAGNOSIS — F411 Generalized anxiety disorder: Secondary | ICD-10-CM

## 2020-06-02 NOTE — Telephone Encounter (Signed)
Requested Prescriptions  Pending Prescriptions Disp Refills  . escitalopram (LEXAPRO) 5 MG/5ML solution [Pharmacy Med Name: ESCITALOPRAM OXALATE 5 MG/5 ML] 600 mL 0    Sig: Take 20 mLs (20 mg total) by mouth daily.     Psychiatry:  Antidepressants - SSRI Passed - 06/02/2020 11:39 AM      Passed - Completed PHQ-2 or PHQ-9 in the last 360 days.      Passed - Valid encounter within last 6 months    Recent Outpatient Visits          4 months ago Anxiety and depression   [place], FNP   5 months ago [place], FNP   8 months ago Intermittent headache   [facility] [place], Devonne Doughty, DO   2 years ago Anxiety and depression   [facility] Merrily[facility] [place], Jerrel Ivory, NP   3 years ago [facility] [place], [place], [place]

## 2020-06-03 ENCOUNTER — Ambulatory Visit: Payer: Self-pay | Admitting: *Deleted

## 2020-06-03 NOTE — Telephone Encounter (Signed)
Patient states he was at work and got dizzy. He feels it was environmental- he was in the back trailer. Patient is having stomach pain- he feels it is related to holding bowel and not going to the bathroom when he has the urge. He sometimes will have sharp pain and dizziness with this action.   Reason for Disposition . [1] MILD pain (e.g., does not interfere with normal activities) AND [2] pain comes and goes (cramps) [3] present > 48 hours  (Exception: this same abdominal pain is a chronic symptom recurrent or ongoing AND present > 4 weeks)  Answer Assessment - Initial Assessment Questions 1. LOCATION: "Where does it hurt?"      R- lower 2. RADIATION: "Does the pain shoot anywhere else?" (e.g., chest, back)     No radation 3. ONSET: "When did the pain begin?" (Minutes, hours or days ago)      This morning 4. SUDDEN: "Gradual or sudden onset?"     sudden 5. PATTERN "Does the pain come and go, or is it constant?"    - If constant: "Is it getting better, staying the same, or worsening?"      (Note: Constant means the pain never goes away completely; most serious pain is constant and it progresses)     - If intermittent: "How long does it last?" "Do you have pain now?"     (Note: Intermittent means the pain goes away completely between bouts)     Comes and goes- usually associated with bowel movement 6. SEVERITY: "How bad is the pain?"  (e.g., Scale 1-10; mild, moderate, or severe)    - MILD (1-3): doesn't interfere with normal activities, abdomen soft and not tender to touch     - MODERATE (4-7): interferes with normal activities or awakens from sleep, tender to touch     - SEVERE (8-10): excruciating pain, doubled over, unable to do any normal activities       Mild/moderate 7. RECURRENT SYMPTOM: "Have you ever had this type of stomach pain before?" If Yes, ask: "When was the last time?" and "What happened that time?"      Before patient needs to have bowel movement- he gets pain 8. CAUSE:  "What do you think is causing the stomach pain?"     Trying to wait to go to empty bowels 9. RELIEVING/AGGRAVATING FACTORS: "What makes it better or worse?" (e.g., movement, antacids, bowel movement)    Bowel movement 10. OTHER SYMPTOMS: "Has there been any vomiting, diarrhea, constipation, or urine problems?"       dizziness  Protocols used: ABDOMINAL PAIN - MALE-A-AH

## 2020-06-04 ENCOUNTER — Encounter: Payer: Self-pay | Admitting: Family Medicine

## 2020-06-04 ENCOUNTER — Other Ambulatory Visit: Payer: Self-pay

## 2020-06-04 ENCOUNTER — Ambulatory Visit
Admission: RE | Admit: 2020-06-04 | Discharge: 2020-06-04 | Disposition: A | Discharge status: Home / Self Care | Payer: BC Managed Care – PPO | Attending: Family Medicine | Admitting: Family Medicine

## 2020-06-04 ENCOUNTER — Ambulatory Visit
Admission: RE | Admit: 2020-06-04 | Discharge: 2020-06-04 | Disposition: A | Discharge status: Home / Self Care | Payer: BC Managed Care – PPO | Source: Ambulatory Visit | Attending: Family Medicine | Admitting: Family Medicine

## 2020-06-04 ENCOUNTER — Ambulatory Visit (INDEPENDENT_AMBULATORY_CARE_PROVIDER_SITE_OTHER): Payer: BC Managed Care – PPO | Admitting: Family Medicine

## 2020-06-04 VITALS — BP 123/74 | HR 75 | Temp 98.5°F | Resp 18 | Ht 72.0 in | Wt 298.8 lb

## 2020-06-04 DIAGNOSIS — F32A Depression, unspecified: Secondary | ICD-10-CM | POA: Diagnosis not present

## 2020-06-04 DIAGNOSIS — F419 Anxiety disorder, unspecified: Secondary | ICD-10-CM

## 2020-06-04 DIAGNOSIS — K5900 Constipation, unspecified: Secondary | ICD-10-CM

## 2020-06-04 NOTE — Progress Notes (Signed)
Subjective:    Patient ID: Timothy [name], male    DOB: 06-17-1996, 24 y.o.   MRN: [id number]  Timothy [name] is a 24 y.o. male presenting on 06/04/2020 for Abdominal Pain   HPI  Timothy presents to clinic for follow up on abdominal pain.  Reports he gets abdominal pain before he needs to have a bowel movement.  Has recently had a short term schedule change at work that has interfered with his bowel routine and he has found that he is holding his bowels instead of emptying while at work.  Reports started to get dizzy after doing this yesterday.  Reports last bowel movement was yesterday, was small pellets and feels "backed up".  Has taken Magnesium citrate in the past with good results, does not tolerate mirilax well.  Reports he has been taking his lexapro and tolerating it well.   Has no acute concerns today.  Depression screen [facility] 2/9 06/04/2020 01/28/2020 12/31/2019  Decreased Interest 0 1 1  Down, Depressed, Hopeless 0 0 0  PHQ - 2 Score 0 1 1  Altered sleeping 0 3 0  Tired, decreased energy 2 1 1   Change in appetite 3 0 3  Feeling bad or failure about yourself  0 0 0  Trouble concentrating 0 0 0  Moving slowly or fidgety/restless 0 0 0  Suicidal thoughts 0 0 0  PHQ-9 Score 5 5 5   Difficult doing work/chores Not difficult at all Somewhat difficult Not difficult at all    Social History   Tobacco Use  . Smoking status: Current Some Day Smoker    Packs/day: 0.25    Types: Cigarettes  . Smokeless tobacco: Never Used  . Tobacco comment: pt smoke pack a week  Vaping Use  . Vaping Use: Some days  . Last attempt to quit: 09/09/2019  . Substances: Nicotine, Flavoring  Substance Use Topics  . Alcohol use: Yes    Alcohol/week: 2.0 standard drinks    Types: 2 Cans of beer per week    Comment: occassionally  . Drug use: No    Review of Systems  Constitutional: Negative.   HENT: Negative.   Eyes: Negative.   Respiratory: Negative.   Cardiovascular: Negative.     Gastrointestinal: Positive for constipation. Negative for abdominal distention, abdominal pain, anal bleeding, blood in stool, diarrhea, nausea, rectal pain and vomiting.  Endocrine: Negative.   Genitourinary: Negative.   Musculoskeletal: Negative.   Skin: Negative.   Allergic/Immunologic: Negative.   Neurological: Negative.   Hematological: Negative.   Psychiatric/Behavioral: Negative.    Per HPI unless specifically indicated above     Objective:    BP 123/74 (BP Location: Left Arm, Patient Position: Sitting, Cuff Size: Large)   Pulse 75   Temp 98.5 F (36.9 C) (Oral)   Resp 18   Ht 6' (1.829 m)   Wt 298 lb 12.8 oz (135.5 kg)   SpO2 98%   BMI 40.52 kg/m   Wt Readings from Last 3 Encounters:  06/04/20 298 lb 12.8 oz (135.5 kg)  01/28/20 286 lb 3.2 oz (129.8 kg)  12/31/19 285 lb 12.8 oz (129.6 kg)    Physical Exam Vitals and nursing note reviewed.  Constitutional:      General: He is not in acute distress.    Appearance: Normal appearance. He is well-developed and well-groomed. He is morbidly obese. He is not ill-appearing or toxic-appearing.  HENT:     Head: Normocephalic and atraumatic.     Nose:  Comments: Lizbeth Bark is in place, covering mouth and nose. Eyes:     General:        Right eye: No discharge.        Left eye: No discharge.     Extraocular Movements: Extraocular movements intact.     Conjunctiva/sclera: Conjunctivae normal.     Pupils: Pupils are equal, round, and reactive to light.  Cardiovascular:     Rate and Rhythm: Normal rate and regular rhythm.     Pulses: Normal pulses.     Heart sounds: Normal heart sounds. No murmur heard.  No friction rub. No gallop.   Pulmonary:     Effort: Pulmonary effort is normal. No respiratory distress.     Breath sounds: Normal breath sounds.  Abdominal:     General: Bowel sounds are normal.     Palpations: Abdomen is soft.     Tenderness: There is no abdominal tenderness.     Comments: Unable to palpate for  hepatomegaly or splenomegaly due to body habitus  Musculoskeletal:     Right lower leg: No edema.     Left lower leg: No edema.  Skin:    General: Skin is warm and dry.     Capillary Refill: Capillary refill takes less than 2 seconds.  Neurological:     General: No focal deficit present.     Mental Status: He is alert and oriented to person, place, and time.  Psychiatric:        Attention and Perception: Attention and perception normal.        Mood and Affect: Mood and affect normal.        Speech: Speech normal.        Behavior: Behavior normal. Behavior is cooperative.        Thought Content: Thought content normal.        Cognition and Memory: Cognition and memory normal.    Results for orders placed or performed in visit on 12/31/19  POCT glycosylated hemoglobin (Hb A1C)  Result Value Ref Range   Hemoglobin A1C 5.4 4.0 - 5.6 %   HbA1c POC (<> result, manual entry)     HbA1c, POC (prediabetic range)     HbA1c, POC (controlled diabetic range)    POCT Urinalysis Dipstick  Result Value Ref Range   Color, UA Yellow    Clarity, UA clear    Glucose, UA Negative Negative   Bilirubin, UA Negative    Ketones, UA Negative    Spec Grav, UA <=1.005 (A) 1.010 - 1.025   Blood, UA negative    pH, UA 5.0 5.0 - 8.0   Protein, UA Negative Negative   Urobilinogen, UA 0.2 0.2 or 1.0 E.U./dL   Nitrite, UA negative    Leukocytes, UA Negative Negative   Appearance     Odor        Assessment & Plan:   Problem List Items Addressed This Visit      Other   Anxiety and depression    PHQ9-5/GAD7-0.  Currently stable and well tolerated prescription of escitalopram 20mg  daily.  Will renew prescription.  Plan: 1. Continue escitalopram 20mg  daily 2. Continue following mood handout recommendations 3. RTC in 3 months for re-evaluation      Constipation - Primary    Likely constipation due to change in bowel regimen, has been holding his bowel movements while at work, due to a shift change,  instead of evacuating according to his bowel regimen.  Will have abdominal XR ordered today.  Discussed can increase fluids and fiber, can also take OTC Magnesium citrate, since he has used this in the past and tolerates well.  Plan: 1. Stay on structured bowel regimen at home and at work 2. Can take OTC Magensium Citrate as directed 3. Work towards increasing water and fiber intake 4. RTC if symptoms worsen or fail to improve      Relevant Orders   DG Abd 1 View      No orders of the defined types were placed in this encounter.   Follow up plan: Return in about 3 months (around 09/04/2020) for Anxiety and depression follow up visit.   Harlin Rain, [place] 06/04/2020, 9:15 AM

## 2020-06-04 NOTE — Progress Notes (Signed)
Abdominal xray shows some constipation.  No other abnormality.

## 2020-06-04 NOTE — Assessment & Plan Note (Signed)
PHQ9-5/GAD7-0.  Currently stable and well tolerated prescription of escitalopram 20mg  daily.  Will renew prescription.  Plan: 1. Continue escitalopram 20mg  daily 2. Continue following mood handout recommendations 3. RTC in 3 months for re-evaluation

## 2020-06-04 NOTE — Assessment & Plan Note (Signed)
Likely constipation due to change in bowel regimen, has been holding his bowel movements while at work, due to a shift change, instead of evacuating according to his bowel regimen.  Will have abdominal XR ordered today.  Discussed can increase fluids and fiber, can also take OTC Magnesium citrate, since he has used this in the past and tolerates well.  Plan: 1. Stay on structured bowel regimen at home and at work 2. Can take OTC Magensium Citrate as directed 3. Work towards increasing water and fiber intake 4. RTC if symptoms worsen or fail to improve

## 2020-06-04 NOTE — Patient Instructions (Signed)
As we discussed, it is important to stay on a regular bowel movement regimen.  With your work schedule having changed and avoiding having a bowel movement, it has probably led to your constipation.  We have done an abdominal xray today and will contact you when we receive the results.  Increase your fluid and fiber intake to help with regular bowel movements.  Can take over the counter Magnesium Citrate to achieve the bowel clean out you are looking for.  If you need help with staying regular, can look at over the counter Mirilax  We will plan to see you back in 3 months for anxiety and depression follow up visit  You will receive a survey after today's visit either digitally by e-mail or paper by [place] mail. Your experiences and feedback matter to [place].  Please respond so we know how we are doing as we provide care for you.  Call us with any questions/concerns/needs.  It is my goal to be available to you for your health concerns.  Thanks for choosing me to be a partner in your healthcare needs!  Harlin Rain, FNP-C Family Nurse Practitioner [facility] Phone: [phone]

## 2020-06-04 NOTE — Telephone Encounter (Signed)
OV 06/04/2020

## 2020-06-11 ENCOUNTER — Encounter: Payer: Self-pay | Admitting: Family Medicine

## 2020-06-11 ENCOUNTER — Other Ambulatory Visit: Payer: Self-pay

## 2020-06-11 ENCOUNTER — Telehealth (INDEPENDENT_AMBULATORY_CARE_PROVIDER_SITE_OTHER): Payer: BC Managed Care – PPO | Admitting: Family Medicine

## 2020-06-11 ENCOUNTER — Other Ambulatory Visit: Payer: BC Managed Care – PPO

## 2020-06-11 DIAGNOSIS — Z20822 Contact with and (suspected) exposure to covid-19: Secondary | ICD-10-CM

## 2020-06-11 DIAGNOSIS — B349 Viral infection, unspecified: Secondary | ICD-10-CM | POA: Diagnosis not present

## 2020-06-11 NOTE — Patient Instructions (Addendum)
As we discussed, it is encouraged that you have COVID testing.  Continue in quarantine/isolation until you receive your COVID test results.  Increase your water intake, eat crackers, toast, rice, foods that can help slow down your diarrhea.  Alternate ibuprofen and acetaminophen, according to packaging directions.  If you begin to have worsening shortness of breath, chest pain, fever over 104 that is not responsive to ibuprofen and/or acetaminophen, or impending sense of doom to [place]!  We will plan to see you back if your symptoms worsen or fail to improve  You will receive a survey after today's visit either digitally by e-mail or paper by [employer] mail. Your experiences and feedback matter to [place].  Please respond so we know how we are doing as we provide care for you.  Call us with any questions/concerns/needs.  It is my goal to be available to you for your health concerns.  Thanks for choosing me to be a partner in your healthcare needs!  Harlin Rain, FNP-C Family Nurse Practitioner [facility] Phone: [phone]

## 2020-06-11 NOTE — Assessment & Plan Note (Signed)
Viral symptoms reported x 6 days, since day of/after our last OV on 06/04/2020.  Has had abdominal cramping, nausea, vomiting, diarrhea, dry coughing, lack of appetite, nasal congestion, loss of taste/smell, and sore throat.  Encouraged COVID testing and to treat symptoms.  Can take ibuprofen and/or acetaminophen, according to packaging instructions.  Can gargle with warm salt water.  Rest, hydrate and encouraged a BRAT diet.  Plan: 1. Have COVID testing completed 2. Treat symptomatically 3. RTC PRN

## 2020-06-11 NOTE — Progress Notes (Signed)
Virtual Visit via MyChart Video Visit  The purpose of this virtual visit is to provide medical care while limiting exposure to the novel coronavirus (COVID19) for both patient and office staff.  Consent was obtained for phone visit:  Yes.   Answered questions that patient had about telehealth interaction:  Yes.   I discussed the limitations, risks, security and privacy concerns of performing an evaluation and management service by telephone. I also discussed with the patient that there may be a patient responsible charge related to this service. The patient expressed understanding and agreed to proceed.  Patient is at home and is accessed via [place] are provided by Harlin Rain, FNP-C from [facility])  ---------------------------------------------------------------------- Chief Complaint  Patient presents with  . Abdominal Pain    diarrhea x 2 days , abdominal cramping, coughing x 6 days, sore throat, lack appetite, vomiting  and stuffy nose    S: Reviewed CMA documentation. I have called patient and gathered additional HPI as follows:  Mr. Bloodgood presents to clinic for concerns of diarrhea x 2 days with abdominal cramping, dry coughing x 6 days, a sore throat, nasal congestion, lack of appetite, and vomiting.  Reports he has been notified of 2 coworkers that have tested positive for COVID.  Unknown fever.  Denies chills, sweats, body aches, sinus pain or pressure, headache, SOB, DOE, CP.  Has not had a COVID test done.  Has not been vaccinated for COVID.  Patient is currently home Denies any high risk travel to areas of current concern for COVID19. Denies any known or suspected exposure to person with or possibly with COVID19.  Past Medical History:  Diagnosis Date  . Allergy   . Depression   . Diverticulitis   . Dyspnea   . Fainting spell    in middle school  . Family history of bone cancer   . Family history of breast cancer     Social History   Tobacco Use  . Smoking status: Current Some Day Smoker    Packs/day: 0.25    Types: Cigarettes  . Smokeless tobacco: Never Used  . Tobacco comment: pt smoke pack a week  Vaping Use  . Vaping Use: Some days  . Last attempt to quit: 09/09/2019  . Substances: Nicotine, Flavoring  Substance Use Topics  . Alcohol use: Yes    Alcohol/week: 2.0 standard drinks    Types: 2 Cans of beer per week    Comment: occassionally  . Drug use: No    Current Outpatient Medications:  .  acetaminophen (TYLENOL) 160 MG/5ML liquid, Take 500 mg by mouth at bedtime as needed for fever., Disp: , Rfl:  .  escitalopram (LEXAPRO) 5 MG/5ML solution, Take 20 mLs (20 mg total) by mouth daily., Disp: 600 mL, Rfl: 0  Depression screen [facility] 2/9 06/04/2020 01/28/2020 12/31/2019  Decreased Interest 0 1 1  Down, Depressed, Hopeless 0 0 0  PHQ - 2 Score 0 1 1  Altered sleeping 0 3 0  Tired, decreased energy 2 1 1   Change in appetite 3 0 3  Feeling bad or failure about yourself  0 0 0  Trouble concentrating 0 0 0  Moving slowly or fidgety/restless 0 0 0  Suicidal thoughts 0 0 0  PHQ-9 Score 5 5 5   Difficult doing work/chores Not difficult at all Somewhat difficult Not difficult at all    GAD 7 : Generalized Anxiety Score 06/04/2020 01/28/2020 12/31/2019 09/19/2019  Nervous, Anxious, on  Edge 0 0 1 2  Control/stop worrying 0 0 1 2  Worry too much - different things 0 0 2 2  Trouble relaxing 0 0 1 3  Restless 0 1 1 3   Easily annoyed or irritable 0 1 1 1   Afraid - awful might happen 0 1 2 1   Total GAD 7 Score 0 3 9 14   Anxiety Difficulty Not difficult at all Not difficult at all Somewhat difficult Not difficult at all    -------------------------------------------------------------------------- O: No physical exam performed due to remote telephone encounter.  Physical Exam: Patient remotely monitored with video.  Verbal communication appropriate.  Cognition normal.  No results found for this or  any previous visit (from the past 2160 hour(s)).  -------------------------------------------------------------------------- A&P:  Problem List Items Addressed This Visit      Other   Viral infection - Primary    Viral symptoms reported x 6 days, since day of/after our last OV on 06/04/2020.  Has had abdominal cramping, nausea, vomiting, diarrhea, dry coughing, lack of appetite, nasal congestion, loss of taste/smell, and sore throat.  Encouraged COVID testing and to treat symptoms.  Can take ibuprofen and/or acetaminophen, according to packaging instructions.  Can gargle with warm salt water.  Rest, hydrate and encouraged a BRAT diet.  Plan: 1. Have COVID testing completed 2. Treat symptomatically 3. RTC PRN         No orders of the defined types were placed in this encounter.   Follow-up: - Return if symptoms worsen or fail to improve  Patient verbalizes understanding with the above medical recommendations including the limitation of remote medical advice.  Specific follow-up and call-back criteria were given for patient to follow-up or seek medical care more urgently if needed.  - Time spent in direct consultation with patient on video: 9 minutes  Harlin Rain, [facility] 06/11/2020, 2:40 PM

## 2020-06-12 LAB — NOVEL CORONAVIRUS, NAA: SARS-CoV-2, NAA: NOT DETECTED

## 2020-06-12 LAB — SARS-COV-2, NAA 2 DAY TAT

## 2020-07-16 ENCOUNTER — Other Ambulatory Visit: Payer: Self-pay | Admitting: Family Medicine

## 2020-07-16 DIAGNOSIS — F411 Generalized anxiety disorder: Secondary | ICD-10-CM

## 2020-08-17 ENCOUNTER — Other Ambulatory Visit: Payer: Self-pay | Admitting: Family Medicine

## 2020-08-17 DIAGNOSIS — F411 Generalized anxiety disorder: Secondary | ICD-10-CM

## 2020-09-03 ENCOUNTER — Ambulatory Visit: Payer: Self-pay | Admitting: Family Medicine

## 2020-09-03 NOTE — Telephone Encounter (Signed)
Ret'd call to pt.  Reported last BM was 2 days ago, and voiced concern. Last stool was large formed without presence of blood.  Reported normal bowel evacuation is twice/ day.  C/o feeling "uncomfortable" in the lower abdomen.  Stated he tried Mag Citrate last night, and vomited it, due to the "nasty taste".  Denies any nausea or further vomiting.  Denied fever.  Is not sure about abdominal bloating; stated he is large in size and has a big belly.  Reported is passing gas.  Discussed several home care methods to improve bowel function.  Care advice given per protocol.  Pt. Verb. Understanding.  Requested to cancel his appt. For tomorrow.  Reported he wants to try different recommendations to improve his bowels.  Encouraged to call back Monday, if constipation persists.  Agreed with plan.  .   Reason for Disposition . MILD constipation  Answer Assessment - Initial Assessment Questions 1. STOOL PATTERN OR FREQUENCY: "How often do you pass bowel movements (BMs)?"  (Normal range: tid to q 3 days)  "When was the last BM passed?"       Usually has BM 2 x/ day 2. STRAINING: "Do you have to strain to have a BM?"      yes 3. RECTAL PAIN: "Does your rectum hurt when the stool comes out?" If Yes, ask: "Do you have hemorrhoids? How bad is the pain?"  (Scale 1-10; or mild, moderate, severe)     Denied  4. STOOL COMPOSITION: "Are the stools hard?"      Last stool on Tues.; large stool 5. BLOOD ON STOOLS: "Has there been any blood on the toilet tissue or on the surface of the BM?" If Yes, ask: "When was the last time?"      Denied  6. CHRONIC CONSTIPATION: "Is this a new problem for you?"  If no, ask: "How long have you had this problem?" (days, weeks, months)      Has had some constipation in past  7. CHANGES IN DIET OR HYDRATION: "Have there been any recent changes in your diet?" "How much fluids are you drinking consuming on a daily basis?"  "How much have you had to drink today?"     Drinks about 3 bottles  water / day 8. MEDICATIONS: "Have you been taking any new medications?" "Are you taking any narcotic pain medications?" (e.g., Vicoden, Percocet, morphine, dilaudid)      9. LAXATIVES: "Have you been using any stool softeners, laxatives, or enemas?"  If yes, ask "What, how often, and when was the last time?"    Tried to take Mag. Citrate last eve, and vomited it.  10.ACTIVITY:  "How much walking do you do every day? on a daily basis?"  "Has your activity level decreased in the past week?"        Has not been very active 11. CAUSE: "What do you think is causing the constipation?"         12. OTHER SYMPTOMS: "Do you have any other symptoms?" (e.g., abdominal pain, bloating, fever, vomiting)       Reported uncomfortable in lower intestine; is passing gas; vomited x 1 after taking Magnesium Citrate yesterday, but denied any nausea/ vomiting.  13. MEDICAL HISTORY: "Do you have a history of hemorrhoids, rectal fissures, or rectal surgery or rectal abscess?"         Hx of surgery for diverticulitis 14. PREGNANCY: "Is there any chance you are pregnant?" "When was your last menstrual period?"  n/a  Protocols used: CONSTIPATION-A-AH  Message from Scherrie Gerlach sent at 09/03/2020 8:37 AM EST  Pt states he went to the restroom Monday and had a bowel movement. Pt states since then he has not been able to go. He says it is difficult to explain, but his stomach feels "heavy". Made appt for Friday which was first available. But advised pt I would have nurse call him because I could not understand exactly what he needed to be seen for.

## 2020-09-04 ENCOUNTER — Ambulatory Visit: Payer: BC Managed Care – PPO | Admitting: Family Medicine

## 2020-10-13 IMAGING — DX DG CHEST 1V PORT
1 series · 1 of 1 positions shown · non-contrast
Comparison: 08/28/2017

CLINICAL DATA: Dizziness

EXAM:
PORTABLE CHEST 1 VIEW

[chest ap]
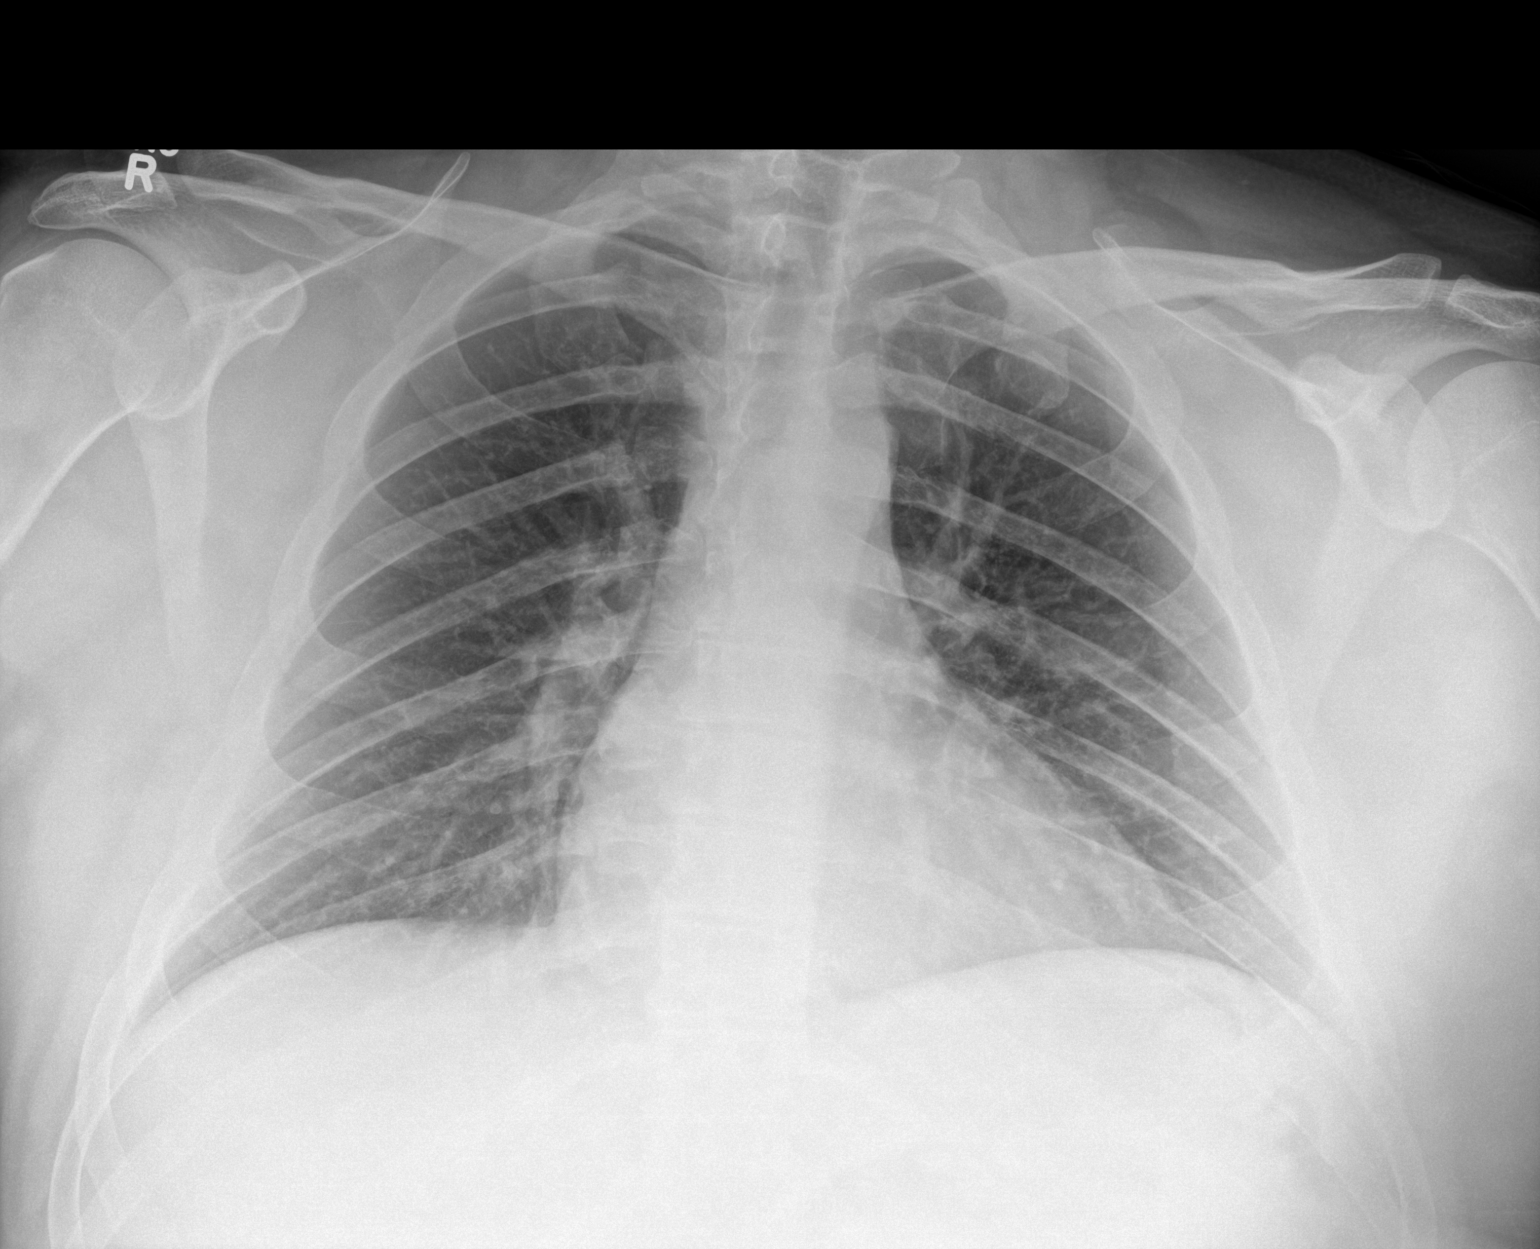

[1 of 1 positions shown; findings below may reference images not displayed]

FINDINGS: The heart size and mediastinal contours are within normal limits.
Both lungs are clear. The visualized skeletal structures are
unremarkable.
IMPRESSION: No active disease.

## 2020-11-17 ENCOUNTER — Ambulatory Visit: Payer: BC Managed Care – PPO | Admitting: Unknown Physician Specialty

## 2020-12-28 ENCOUNTER — Ambulatory Visit: Payer: Self-pay | Admitting: Family Medicine

## 2020-12-30 ENCOUNTER — Ambulatory Visit: Payer: Self-pay | Admitting: Internal Medicine

## 2020-12-30 NOTE — Progress Notes (Deleted)
Subjective:    Patient ID: Timothy [name], male    DOB: [birth year], 25 y.o.   MRN: [id number]  HPI  Patient presents to the clinic today with complaint of.  He has a history of diverticulitis with perforation status post colectomy with colostomy placement.  Review of Systems  Past Medical History:  Diagnosis Date  . Allergy   . Depression   . Diverticulitis   . Dyspnea   . Fainting spell    in middle school  . Family history of bone cancer   . Family history of breast cancer     Current Outpatient Medications  Medication Sig Dispense Refill  . acetaminophen (TYLENOL) 160 MG/5ML liquid Take 500 mg by mouth at bedtime as needed for fever.    . escitalopram (LEXAPRO) 5 MG/5ML solution Take 20 mLs (20 mg total) by mouth daily. 600 mL 0   No current facility-administered medications for this visit.    No Known Allergies  Family History  Problem Relation Age of Onset  . Heart disease Maternal Grandmother   . Diabetes Maternal Grandmother   . Cancer Maternal Grandmother        breast cancer  . Alport syndrome Maternal Grandmother   . Cancer Paternal Grandfather        Bone cancer  . Healthy Sister   . Alport syndrome Maternal Aunt   . Depression Mother   . Cervical cancer Mother     Social History   Socioeconomic History  . Marital status: Single    Spouse name: Not on file  . Number of children: Not on file  . Years of education: Not on file  . Highest education level: Not on file  Occupational History  . Not on file  Tobacco Use  . Smoking status: Current Some Day Smoker    Packs/day: 0.25    Types: Cigarettes  . Smokeless tobacco: Never Used  . Tobacco comment: pt smoke pack a week  Vaping Use  . Vaping Use: Some days  . Last attempt to quit: 09/09/2019  . Substances: Nicotine, Flavoring  Substance and Sexual Activity  . Alcohol use: Yes    Alcohol/week: 2.0 standard drinks    Types: 2 Cans of beer per week    Comment: occassionally  . Drug use: No   . Sexual activity: Not on file  Other Topics Concern  . Not on file  Social History Narrative  . Not on file   Social Determinants of Health   Financial Resource Strain: Not on file  Food Insecurity: Not on file  Transportation Needs: Not on file  Physical Activity: Not on file  Stress: Not on file  Social Connections: Not on file  Intimate Partner Violence: Not on file     Constitutional: Denies fever, malaise, fatigue, headache or abrupt weight changes.  HEENT: Denies eye pain, eye redness, ear pain, ringing in the ears, wax buildup, runny nose, nasal congestion, bloody nose, or sore throat. Respiratory: Denies difficulty breathing, shortness of breath, cough or sputum production.   Cardiovascular: Denies chest pain, chest tightness, palpitations or swelling in the hands or feet.  Gastrointestinal: Denies abdominal pain, bloating, constipation, diarrhea or blood in the stool.  GU: Denies urgency, frequency, pain with urination, burning sensation, blood in urine, odor or discharge. Musculoskeletal: Denies decrease in range of motion, difficulty with gait, muscle pain or joint pain and swelling.  Skin: Denies redness, rashes, lesions or ulcercations.  Neurological: Denies dizziness, difficulty with memory, difficulty with speech  or problems with balance and coordination.  Psych: Denies anxiety, depression, SI/HI.  No other specific complaints in a complete review of systems (except as listed in HPI above).     Objective:   Physical Exam   There were no vitals taken for this visit. Wt Readings from Last 3 Encounters:  06/04/20 298 lb 12.8 oz (135.5 kg)  01/28/20 286 lb 3.2 oz (129.8 kg)  12/31/19 285 lb 12.8 oz (129.6 kg)    General: Appears their stated age, well developed, well nourished in NAD. Skin: Warm, dry and intact. No rashes, lesions or ulcerations noted. HEENT: Head: normal shape and size; Eyes: sclera white, no icterus, conjunctiva pink, PERRLA and EOMs  intact; Ears: Tm's gray and intact, normal light reflex; Nose: mucosa pink and moist, septum midline; Throat/Mouth: Teeth present, mucosa pink and moist, no exudate, lesions or ulcerations noted.  Neck:  Neck supple, trachea midline. No masses, lumps or thyromegaly present.  Cardiovascular: Normal rate and rhythm. S1,S2 noted.  No murmur, rubs or gallops noted. No JVD or BLE edema. No carotid bruits noted. Pulmonary/Chest: Normal effort and positive vesicular breath sounds. No respiratory distress. No wheezes, rales or ronchi noted.  Abdomen: Soft and nontender. Normal bowel sounds. No distention or masses noted. Liver, spleen and kidneys non palpable. Musculoskeletal: Normal range of motion. No signs of joint swelling. No difficulty with gait.  Neurological: Alert and oriented. Cranial nerves II-XII grossly intact. Coordination normal.  Psychiatric: Mood and affect normal. Behavior is normal. Judgment and thought content normal.   BMET    Component Value Date/Time   NA 140 12/09/2019 0555   NA 138 12/16/2014 0839   K 3.9 12/09/2019 0555   K 3.9 12/16/2014 0839   CL 106 12/09/2019 0555   CL 106 12/16/2014 0839   CO2 25 12/09/2019 0555   CO2 26 12/16/2014 0839   GLUCOSE 101 (H) 12/09/2019 0555   GLUCOSE 110 (H) 12/16/2014 0839   BUN 15 12/09/2019 0555   BUN 11 12/16/2014 0839   CREATININE 0.96 12/09/2019 0555   CREATININE 0.81 12/16/2014 0839   CALCIUM 9.1 12/09/2019 0555   CALCIUM 8.8 (L) 12/16/2014 0839   GFRNONAA >60 12/09/2019 0555   GFRNONAA >60 12/16/2014 0839   GFRAA >60 12/09/2019 0555   GFRAA >60 12/16/2014 0839    Lipid Panel  No results found for: CHOL, TRIG, HDL, CHOLHDL, VLDL, LDLCALC  CBC    Component Value Date/Time   WBC 11.8 (H) 12/09/2019 0555   RBC 5.38 12/09/2019 0555   HGB 15.8 12/09/2019 0555   HGB 14.6 12/16/2014 0839   HCT 47.2 12/09/2019 0555   HCT 44.0 12/16/2014 0839   PLT 269 12/09/2019 0555   PLT 233 12/16/2014 0839   MCV 87.7 12/09/2019  0555   MCV 88 12/16/2014 0839   MCH 29.4 12/09/2019 0555   MCHC 33.5 12/09/2019 0555   RDW 13.2 12/09/2019 0555   RDW 13.8 12/16/2014 0839   LYMPHSABS 2.3 03/29/2019 0234   LYMPHSABS 2.9 12/16/2014 0839   MONOABS 1.3 (H) 03/29/2019 0234   MONOABS 1.2 (H) 12/16/2014 0839   EOSABS 0.4 03/29/2019 0234   EOSABS 0.3 12/16/2014 0839   BASOSABS 0.1 03/29/2019 0234   BASOSABS 0.1 12/16/2014 0839    Hgb A1C Lab Results  Component Value Date   HGBA1C 5.4 12/31/2019           Assessment & Plan:    [name], NP This visit occurred during the SARS-CoV-2 public health emergency.  Safety  protocols were in place, including screening questions prior to the visit, additional usage of staff PPE, and extensive cleaning of exam room while observing appropriate contact time as indicated for disinfecting solutions.

## 2021-02-13 ENCOUNTER — Encounter: Payer: Self-pay | Admitting: Emergency Medicine

## 2021-02-13 ENCOUNTER — Emergency Department
Admission: EM | Admit: 2021-02-13 | Discharge: 2021-02-13 | Disposition: A | Discharge status: Home / Self Care | Payer: No Typology Code available for payment source | Attending: Emergency Medicine | Admitting: Emergency Medicine

## 2021-02-13 ENCOUNTER — Emergency Department: Payer: No Typology Code available for payment source

## 2021-02-13 DIAGNOSIS — F17210 Nicotine dependence, cigarettes, uncomplicated: Secondary | ICD-10-CM | POA: Diagnosis not present

## 2021-02-13 DIAGNOSIS — R1032 Left lower quadrant pain: Secondary | ICD-10-CM | POA: Insufficient documentation

## 2021-02-13 DIAGNOSIS — F411 Generalized anxiety disorder: Secondary | ICD-10-CM | POA: Diagnosis not present

## 2021-02-13 LAB — URINALYSIS, COMPLETE (UACMP) WITH MICROSCOPIC
Bacteria, UA: NONE SEEN
Bilirubin Urine: NEGATIVE
Glucose, UA: NEGATIVE mg/dL
Hgb urine dipstick: NEGATIVE
Ketones, ur: NEGATIVE mg/dL
Leukocytes,Ua: NEGATIVE
Nitrite: NEGATIVE
Protein, ur: NEGATIVE mg/dL
Specific Gravity, Urine: 1.018 (ref 1.005–1.030)
Squamous Epithelial / HPF: NONE SEEN (ref 0–5)
WBC, UA: NONE SEEN WBC/hpf (ref 0–5)
pH: 5 (ref 5.0–8.0)

## 2021-02-13 LAB — COMPREHENSIVE METABOLIC PANEL
ALT: 33 U/L (ref 0–44)
AST: 28 U/L (ref 15–41)
Albumin: 4.6 g/dL (ref 3.5–5.0)
Alkaline Phosphatase: 72 U/L (ref 38–126)
Anion gap: 10 (ref 5–15)
BUN: 20 mg/dL (ref 6–20)
CO2: 24 mmol/L (ref 22–32)
Calcium: 9.1 mg/dL (ref 8.9–10.3)
Chloride: 102 mmol/L (ref 98–111)
Creatinine, Ser: 0.99 mg/dL (ref 0.61–1.24)
GFR, Estimated: >60 mL/min (ref >60)
Glucose, Bld: 116 mg/dL — ABNORMAL HIGH (ref 70–99)
Potassium: 3.6 mmol/L (ref 3.5–5.1)
Sodium: 136 mmol/L (ref 135–145)
Total Bilirubin: 0.4 mg/dL (ref 0.3–1.2)
Total Protein: 8 g/dL (ref 6.5–8.1)

## 2021-02-13 LAB — CBC
HCT: 42.1 % (ref 39.0–52.0)
Hemoglobin: 14.2 g/dL (ref 13.0–17.0)
MCH: 29 pg (ref 26.0–34.0)
MCHC: 33.7 g/dL (ref 30.0–36.0)
MCV: 85.9 fL (ref 80.0–100.0)
Platelets: 261 10*3/uL (ref 150–400)
RBC: 4.9 MIL/uL (ref 4.22–5.81)
RDW: 13.2 % (ref 11.5–15.5)
WBC: 11.4 10*3/uL — ABNORMAL HIGH (ref 4.0–10.5)
nRBC: 0 % (ref 0.0–0.2)

## 2021-02-13 LAB — LIPASE, BLOOD: Lipase: 30 U/L (ref 11–51)

## 2021-02-13 MED ORDER — ESCITALOPRAM OXALATE 5 MG/5ML PO SOLN: 20.0000 mg | Freq: Every day | ORAL | 0 refills | Status: DC

## 2021-02-13 MED ORDER — IOHEXOL 300 MG/ML  SOLN
100.0000 mL | Freq: Once | INTRAMUSCULAR | Status: AC | PRN
  Administered 2021-02-13: 100 mL via INTRAVENOUS
  Filled 2021-02-13: qty 100

## 2021-02-13 NOTE — ED Provider Notes (Signed)
[facility] Emergency Department Provider Note   ____________________________________________    I have reviewed the triage vital signs and the nursing notes.   HISTORY  Chief Complaint Left lower quadrant abdominal pain     HPI Timothy [name] is a 25 y.o. male who presents with left lower quadrant abdominal pain.  Patient reports he has had this pain since having colostomy reversal over 2 years ago however sometimes it worsens.  Over the last several days it has been worse than typical.  He denies fevers chills.  No nausea or vomiting.  He describes a cramping sensation, mildly worse with eating.  He is having normal stools.  Currently feels well.  Also reports he needs a refill of his Lexapro because of increased stress at his job  Past Medical History:  Diagnosis Date   Allergy    Depression    Diverticulitis    Dyspnea    Fainting spell    in middle school   Family history of bone cancer    Family history of breast cancer     Patient Active Problem List   Diagnosis Date Noted   Sleep disturbance 01/28/2020   Colostomy in place [facility]) 01/30/2019   Anxiety and depression 07/28/2017   Other insomnia 07/28/2017   Allergic rhinitis due to pollen 08/04/2015    Past Surgical History:  Procedure Laterality Date   COLECTOMY WITH COLOSTOMY CREATION/HARTMANN PROCEDURE N/A 07/22/2018   Procedure: COLECTOMY WITH COLOSTOMY CREATION/HARTMANN PROCEDURE;  Surgeon: Fredirick Maudlin, MD;  Location: [facility] ORS;  Service: General;  Laterality: N/A;   COLONOSCOPY WITH PROPOFOL N/A 11/01/2018   Procedure: COLONOSCOPY WITH PROPOFOL;  Surgeon: [name], MD;  Location: [facility] ENDOSCOPY;  Service: Gastroenterology;  Laterality: N/A;   COLOSTOMY REVERSAL N/A 03/22/2019   Procedure: COLOSTOMY REVERSAL;  Surgeon: Fredirick Maudlin, MD;  Location: [facility] ORS;  Service: General;  Laterality: N/A;    Prior to Admission medications   Medication Sig Start Date End Date  Taking? Authorizing Provider  acetaminophen (TYLENOL) 160 MG/5ML liquid Take 500 mg by mouth at bedtime as needed for fever.    [provider]  escitalopram (LEXAPRO) 5 MG/5ML solution Take 20 mLs (20 mg total) by mouth daily. 02/13/21   Lavonia Drafts, MD     Allergies Patient has no known allergies.  Family History  Problem Relation Age of Onset   Heart disease Maternal Grandmother    Diabetes Maternal Grandmother    Cancer Maternal Grandmother        breast cancer   Alport syndrome Maternal Grandmother    Cancer Paternal Grandfather        Bone cancer   Healthy Sister    Alport syndrome Maternal Aunt    Depression Mother    Cervical cancer Mother     Social History Social History   Tobacco Use   Smoking status: Some Days    Packs/day: 0.25    Pack years: 0.00    Types: Cigarettes   Smokeless tobacco: Never   Tobacco comments:    pt smoke pack a week  Vaping Use   Vaping Use: Some days   Last attempt to quit: 09/09/2019   Substances: Nicotine, Flavoring  Substance Use Topics   Alcohol use: Yes    Alcohol/week: 2.0 standard drinks    Types: 2 Cans of beer per week    Comment: occassionally   Drug use: No    Review of Systems  Constitutional: No fever/chills Eyes: No visual changes.  ENT: No sore throat. Cardiovascular: Denies chest pain. Respiratory: Denies shortness of breath. Gastrointestinal: As above Genitourinary: Negative for dysuria. Musculoskeletal: Negative for back pain. Skin: Negative for rash. Neurological: Negative for headaches or weakness   ____________________________________________   PHYSICAL EXAM:  VITAL SIGNS: ED Triage Vitals  Enc Vitals Group     BP 02/13/21 1224 131/65     Pulse Rate 02/13/21 1219 86     Resp 02/13/21 1219 18     Temp 02/13/21 1219 98.4 F (36.9 C)     Temp Source 02/13/21 1219 Oral     SpO2 02/13/21 1219 97 %     Weight 02/13/21 1220 135.5 kg (298 lb 11.6 oz)     Height 02/13/21 1220 1.829 m  (6')     Head Circumference --      Peak Flow --      Pain Score 02/13/21 1220 4     Pain Loc --      Pain Edu? --      Excl. in [place]? --     Constitutional: Alert and oriented. No acute distress. Pleasant and interactive  Nose: No congestion/rhinnorhea. Mouth/Throat: Mucous membranes are moist.   Neck:  Painless ROM Cardiovascular: Normal rate, regular rhythm.  Good peripheral circulation. Respiratory: Normal respiratory effort.  No retractions. Lungs CTAB. Gastrointestinal: Soft, mild tenderness overlying left colostomy reversal scar  Musculoskeletal: Warm and well perfused Neurologic:  Normal speech and language. No gross focal neurologic deficits are appreciated.  Skin:  Skin is warm, dry and intact. No rash noted. Psychiatric: Mood and affect are normal. Speech and behavior are normal.  ____________________________________________   LABS (all labs ordered are listed, but only abnormal results are displayed)  Labs Reviewed  COMPREHENSIVE METABOLIC PANEL - Abnormal; Notable for the following components:      Result Value   Glucose, Bld 116 (*)    All other components within normal limits  CBC - Abnormal; Notable for the following components:   WBC 11.4 (*)    All other components within normal limits  URINALYSIS, COMPLETE (UACMP) WITH MICROSCOPIC - Abnormal; Notable for the following components:   Color, Urine YELLOW (*)    APPearance CLEAR (*)    All other components within normal limits  LIPASE, BLOOD   ____________________________________________  EKG  None ____________________________________________  RADIOLOGY  CT abdomen pelvis reviewed by me ____________________________________________   PROCEDURES  Procedure(s) performed: No  Procedures   Critical Care performed: No ____________________________________________   INITIAL IMPRESSION / ASSESSMENT AND PLAN / ED COURSE  Pertinent labs & imaging results that were available during my care of the  patient were reviewed by me and considered in my medical decision making (see chart for details).   Patient presents with abdominal pain as above, request for medication refill.  Mild tenderness overlying the left colostomy reversal scar, no bloating, no nausea or vomiting.  Lab work is overall reassuring, minimally elevated white blood cell count noted.  CT scan obtained which demonstrates small area of inflammation.  Discussed with Dr. Dahlia Byes of general surgery.  Given patient's reassuring exam, no nausea or vomiting, normal stools appropriate for close outpatient follow-up    ____________________________________________   FINAL CLINICAL IMPRESSION(S) / ED DIAGNOSES  Final diagnoses:  Left lower quadrant abdominal pain  GAD (generalized anxiety disorder)        Note:  This document was prepared using Dragon voice recognition software and may include unintentional dictation errors.    Lavonia Drafts, MD 02/13/21 [id number]

## 2021-02-13 NOTE — ED Triage Notes (Signed)
Pt reports that his lower LLQ pain that has been going on for quite awhile but the pain has gotten worse this am. He was able to have a BM today. He reports that he is under a lot of stress and thinks it may be some of his problem. Pt has had abd surgery 2 years ago.

## 2021-02-13 NOTE — ED Notes (Signed)
Patient transported to CT 

## 2021-02-18 ENCOUNTER — Ambulatory Visit (INDEPENDENT_AMBULATORY_CARE_PROVIDER_SITE_OTHER): Payer: No Typology Code available for payment source | Admitting: General Surgery

## 2021-02-18 ENCOUNTER — Encounter: Payer: Self-pay | Admitting: General Surgery

## 2021-02-18 ENCOUNTER — Other Ambulatory Visit: Payer: Self-pay

## 2021-02-18 VITALS — BP 145/84 | HR 83 | Temp 98.1°F | Ht 72.0 in | Wt 288.0 lb

## 2021-02-18 DIAGNOSIS — E6609 Other obesity due to excess calories: Secondary | ICD-10-CM

## 2021-02-18 DIAGNOSIS — Z6839 Body mass index (BMI) 39.0-39.9, adult: Secondary | ICD-10-CM

## 2021-02-18 NOTE — Progress Notes (Signed)
Patient ID: Timothy [name], male   DOB: 1995/09/12, 25 y.o.   MRN: [id number]  Chief Complaint  Patient presents with   Follow-up    HPI Timothy [name] is a 25 y.o. male.   He underwent an emergency Hartmann procedure in November 2019 for perforated diverticulitis.  He subsequently had his colostomy reversed in July 2020.  He presented to the emergency department over the weekend with left lower quadrant abdominal pain.  He says that he has been having similar discomfort since about February of this year, but on Friday, the pain became so severe that he sought medical attention.  He says that the discomfort typically comes and goes.  It is worse with eating and improves with having a bowel movement.  He has not experienced any nausea or vomiting associated with this discomfort.  He does endorse continuation of his previously poor eating habits.  He says that he is using fiber supplements to try to make sure that he has a bowel movement every day and is usually successful in these endeavors.  During his emergency department evaluation, he did undergo a CT scan of the abdomen and pelvis.  He was found to have a small incisional hernia versus laxity near the old colostomy site.  He is here today to discuss what steps might be next.   Past Medical History:  Diagnosis Date   Allergy    Depression    Diverticulitis    Dyspnea    Fainting spell    in middle school   Family history of bone cancer    Family history of breast cancer     Past Surgical History:  Procedure Laterality Date   COLECTOMY WITH COLOSTOMY CREATION/HARTMANN PROCEDURE N/A 07/22/2018   Procedure: COLECTOMY WITH COLOSTOMY CREATION/HARTMANN PROCEDURE;  Surgeon: Fredirick Maudlin, MD;  Location: [facility] ORS;  Service: General;  Laterality: N/A;   COLONOSCOPY WITH PROPOFOL N/A 11/01/2018   Procedure: COLONOSCOPY WITH PROPOFOL;  Surgeon: [name], MD;  Location: [facility] ENDOSCOPY;  Service: Gastroenterology;  Laterality: N/A;   COLOSTOMY  REVERSAL N/A 03/22/2019   Procedure: COLOSTOMY REVERSAL;  Surgeon: Fredirick Maudlin, MD;  Location: [facility] ORS;  Service: General;  Laterality: N/A;    Family History  Problem Relation Age of Onset   Heart disease Maternal Grandmother    Diabetes Maternal Grandmother    Cancer Maternal Grandmother        breast cancer   Alport syndrome Maternal Grandmother    Cancer Paternal Grandfather        Bone cancer   Healthy Sister    Alport syndrome Maternal Aunt    Depression Mother    Cervical cancer Mother     Social History Social History   Tobacco Use   Smoking status: Some Days    Packs/day: 0.25    Pack years: 0.00    Types: Cigarettes   Smokeless tobacco: Never   Tobacco comments:    pt smoke pack a week  Vaping Use   Vaping Use: Some days   Last attempt to quit: 09/09/2019   Substances: Nicotine, Flavoring  Substance Use Topics   Alcohol use: Yes    Alcohol/week: 2.0 standard drinks    Types: 2 Cans of beer per week    Comment: occassionally   Drug use: No    No Known Allergies  Current Outpatient Medications  Medication Sig Dispense Refill   acetaminophen (TYLENOL) 160 MG/5ML liquid Take 500 mg by mouth at bedtime as needed for fever.  escitalopram (LEXAPRO) 5 MG/5ML solution Take 20 mLs (20 mg total) by mouth daily. 600 mL 0   No current facility-administered medications for this visit.    Review of Systems Review of Systems  Gastrointestinal:  Positive for abdominal pain and constipation.       GERD  Musculoskeletal:        Foot pain at night  All other systems reviewed and are negative.  Blood pressure (!) 145/84, pulse 83, temperature 98.1 F (36.7 C), height 6' (1.829 m), weight 288 lb (130.6 kg), SpO2 96 %. Body mass index is 39.06 kg/m.  Physical Exam Physical Exam Constitutional:      General: He is not in acute distress.    Appearance: He is obese.  HENT:     Head: Normocephalic and atraumatic.     Nose:     Comments: Covered with a  mask    Mouth/Throat:     Comments: Covered with a mask Eyes:     General: No scleral icterus.       Right eye: No discharge.        Left eye: No discharge.  Cardiovascular:     Rate and Rhythm: Normal rate and regular rhythm.     Pulses: Normal pulses.  Pulmonary:     Effort: Pulmonary effort is normal.     Breath sounds: Normal breath sounds.  Abdominal:     General: Bowel sounds are normal.     Palpations: Abdomen is soft.       Comments: The pink and purple circle marks the area of tenderness.  No discrete hernia is identified on exam.  Genitourinary:    Comments: Deferred Musculoskeletal:        General: No swelling or tenderness.  Skin:    General: Skin is warm and dry.  Neurological:     General: No focal deficit present.     Mental Status: He is alert and oriented to person, place, and time.  Psychiatric:        Mood and Affect: Mood normal.        Behavior: Behavior normal.    Data Reviewed I personally reviewed the CT scan performed during his emergency department evaluation.  In truth, the hernia is quite difficult to appreciate, but I do see the area of inflammation discussed by the radiologist.  I have copied the radiology interpretation here:  CLINICAL DATA:  25 year old with left lower quadrant pain. History of partial colectomy with colostomy and colostomy takedown.   EXAM: CT ABDOMEN AND PELVIS WITH CONTRAST   TECHNIQUE: Multidetector CT imaging of the abdomen and pelvis was performed using the standard protocol following bolus administration of intravenous contrast.   CONTRAST:  166mL OMNIPAQUE IOHEXOL 300 MG/ML  SOLN   COMPARISON:  07/21/2018   FINDINGS: Lower chest: Motion artifact at the lung bases. Otherwise, the lung bases are clear.   Hepatobiliary: Normal appearance of the liver, gallbladder and portal venous system. No biliary dilatation.   Pancreas: Unremarkable. No pancreatic ductal dilatation or surrounding inflammatory  changes.   Spleen: Normal in size without focal abnormality.   Adrenals/Urinary Tract: Normal adrenal glands. Normal appearance of both kidneys without hydronephrosis. No suspicious renal lesions. Normal appearance of the urinary bladder. Mild stranding around the mid left ureter but this is likely chronic based on the previous examination.   Stomach/Bowel: Patient has had partial colonic resection in the sigmoid colon. Normal appearance of the appendix. There is no significant wall thickening or inflammatory changes involving  the colon. Normal appearance of the stomach and duodenum. Dilated loops of small bowel in the left lower anterior abdomen with a transition point associated with a small left anterior ventral hernia/focal laxity on sequence 2, image 71. Small hernia is located between the rectus abdominal musculature and the left oblique musculature and suspect this is near the old colostomy site. This wall hernia is very small but small bowel loop in this region has some mild wall thickening and inflammatory changes. Small bowel just proximal to the hernia is slightly dilated. Findings are suggestive for mild or partial obstructive process. Distal small bowel is decompressed.   Vascular/Lymphatic: Normal caliber of the abdominal aorta. Main arterial visceral structures are patent. Incidentally, there is retrocardiac left renal vein which is a normal variant. Again noted are small retroperitoneal periaortic lymph nodes. Mildly prominent central mesenteric lymph nodes are nonspecific.   Reproductive: Prostate is unremarkable.   Other: Negative for ascites. Negative for free air. Postoperative changes along the anterior abdominal wall   Musculoskeletal: No acute bone abnormality.   IMPRESSION: 1. Dilated loops of small bowel in the left lower abdomen near the old colostomy reversal site. There is a transition point in this area and findings are suggestive for a mild or  partial small bowel obstruction. There is a small hernia near the old colostomy takedown site suggesting that the obstruction could be related to a small incarcerated hernia versus adhesion in this area. 2. Postsurgical changes from a partial sigmoid colon resection. No evidence for acute colonic inflammation. Stranding along the left side of the pelvis appears to be chronic.   These results were called by telephone at the time of interpretation on 02/13/2021 at 2:26 pm to provider Lavonia Drafts , who verbally acknowledged these results.    Results for Timothy [name], Timothy [name] (MRN [id number]) as of 02/18/2021 11:56  Ref. Range 08/29/2019 11:17 12/09/2019 05:55 02/13/2021 12:26  WBC Latest Ref Range: 4.0 - 10.5 K/uL 9.1 11.8 (H) 11.4 (H)  RBC Latest Ref Range: 4.22 - 5.81 MIL/uL 5.24 5.38 4.90  Hemoglobin Latest Ref Range: 13.0 - 17.0 g/dL 15.1 15.8 14.2  HCT Latest Ref Range: 39.0 - 52.0 % 45.5 47.2 42.1  MCV Latest Ref Range: 80.0 - 100.0 fL 86.8 87.7 85.9  MCH Latest Ref Range: 26.0 - 34.0 pg 28.8 29.4 29.0  MCHC Latest Ref Range: 30.0 - 36.0 g/dL 33.2 33.5 33.7  RDW Latest Ref Range: 11.5 - 15.5 % 12.9 13.2 13.2  Platelets Latest Ref Range: 150 - 400 K/uL 263 269 261  nRBC Latest Ref Range: 0.0 - 0.2 % 0.0 0.0 0.0   It appears that he has had a mild leukocytosis since April, but I do not have a clear etiology for this finding.  Results for Timothy [name], Timothy [name] (MRN [id number]) as of 02/18/2021 11:56  Ref. Range 02/13/2021 12:26  Sodium Latest Ref Range: 135 - 145 mmol/L 136  Potassium Latest Ref Range: 3.5 - 5.1 mmol/L 3.6  Chloride Latest Ref Range: 98 - 111 mmol/L 102  CO2 Latest Ref Range: 22 - 32 mmol/L 24  Glucose Latest Ref Range: 70 - 99 mg/dL 116 (H)  BUN Latest Ref Range: 6 - 20 mg/dL 20  Creatinine Latest Ref Range: 0.61 - 1.24 mg/dL 0.99  Calcium Latest Ref Range: 8.9 - 10.3 mg/dL 9.1  Anion gap Latest Ref Range: 5 - 15  10  Alkaline Phosphatase Latest Ref Range: 38 - 126 U/L 72   Albumin Latest Ref Range: 3.5 -  5.0 g/dL 4.6  Lipase Latest Ref Range: 11 - 51 U/L 30  AST Latest Ref Range: 15 - 41 U/L 28  ALT Latest Ref Range: 0 - 44 U/L 33  Total Protein Latest Ref Range: 6.5 - 8.1 g/dL 8.0  Total Bilirubin Latest Ref Range: 0.3 - 1.2 mg/dL 0.4  GFR, Estimated Latest Ref Range: >60 mL/min >60  These chemistry labs are unremarkable.  Assessment Is a 25 year old man who is now about 2 years out from colostomy reversal after an emergency Hartmann procedure in November 2019.  He has had intermittent abdominal pain near his prior colostomy site.  There appears to be a small hernia versus tissue laxity in this location.  Plan I discussed with him that I may or may not be able to surgically improve upon his current status.  I do think that weight loss might be beneficial both in terms of potentially alleviating his symptoms or better facilitating a repair in the future.  He had extremely dense scar tissue and adhesions throughout his entire abdomen at the time that his colostomy was reversed and I am somewhat reluctant to approach a new operation in him, given those findings at that procedure.  It is possible that this could potentially be done more locally, right at the stoma incision site.  He also demonstrated very poor wound healing with a prolonged course of secondary intention.  I have encouraged him to engage in a weight loss program and have placed referral to our weight management clinic to help facilitate this.  I would like to see him in about 3 months to assess his progress and determine whether or not surgical intervention might still be of benefit to him.    Fredirick Maudlin 02/18/2021, 11:41 AM

## 2021-02-18 NOTE — Patient Instructions (Addendum)
We would you like to loose 25-30 pounds before we think about operating on you. Try being more active when not at work. You should try eating smaller portions. Be sure you are having 1-2 bowel movements a day. We will refer you to our Weight loss Management Clinic. They will call you to schedule.    Follow up here in about 3-4 months.   Hernia, Adult     A hernia happens when tissue inside your body pushes out through a weak spot in your belly muscles (abdominal wall). This makes a round lump (bulge). The lump may be: In a scar from surgery that was done in your belly (incisional hernia). Near your belly button (umbilical hernia). In your groin (inguinal hernia). Your groin is the area where your leg meets your lower belly (abdomen). This kind of hernia could also be: In your scrotum, if you are male. In folds of skin around your vagina, if you are male. In your upper thigh (femoral hernia). Inside your belly (hiatal hernia). This happens when your stomach slides above the muscle between your belly and your chest (diaphragm). If your hernia is small and it does not cause pain, you may not need treatment.If your hernia is large or it causes pain, you may need surgery. Follow these instructions at home: Activity Avoid stretching or overusing (straining) the muscles near your hernia. Straining can happen when you: Lift something heavy. Poop (have a bowel movement). Do not lift anything that is heavier than 10 lb (4.5 kg), or the limit that you are told, until your doctor says that it is safe. Use the strength of your legs when you lift something heavy. Do not use only your back muscles to lift. General instructions Do these things if told by your doctor so you do not have trouble pooping (constipation): Drink enough fluid to keep your pee (urine) pale yellow. Eat foods that are high in fiber. These include fresh fruits and vegetables, whole grains, and beans. Limit foods that are high in  fat and processed sugars. These include foods that are fried or sweet. Take medicine for trouble pooping. When you cough, try to cough gently. You may try to push your hernia in by very gently pressing on it when you are lying down. Do not try to force the bulge back in if it will not push in easily. If you are overweight, work with your doctor to lose weight safely. Do not use any products that have nicotine or tobacco in them. These include cigarettes and e-cigarettes. If you need help quitting, ask your doctor. If you will be having surgery (hernia repair), watch your hernia for changes in shape, size, or color. Tell your doctor if you see any changes. Take over-the-counter and prescription medicines only as told by your doctor. Keep all follow-up visits as told by your doctor. Contact a doctor if: You get new pain, swelling, or redness near your hernia. You poop fewer times in a week than normal. You have trouble pooping. You have poop (stool) that is more dry than normal. You have poop that is harder or larger than normal. Get help right away if: You have a fever. You have belly pain that gets worse. You feel sick to your stomach (nauseous). You throw up (vomit). Your hernia cannot be pushed in by very gently pressing on it when you are lying down. Do not try to force the bulge back in if it will not push in easily. Your hernia: Changes  in shape or size. Changes color. Feels hard or it hurts when you touch it. These symptoms may represent a serious problem that is an emergency. Do not wait to see if the symptoms will go away. Get medical help right away. Call your local emergency services (911 in the [place] Summary A hernia happens when tissue inside your body pushes out through a weak spot in the belly muscles. This creates a bulge. If your hernia is small and it does not hurt, you may not need treatment. If your hernia is large or it hurts, you may need surgery. If you will be  having surgery, watch your hernia for changes in shape, size, or color. Tell your doctor about any changes. This information is not intended to replace advice given to you by your health care provider. Make sure you discuss any questions you have with your healthcare provider. Document Revised: 12/06/2018 Document Reviewed: 05/17/2017 Elsevier Patient Education  [place].

## 2021-03-25 ENCOUNTER — Other Ambulatory Visit: Payer: Self-pay | Admitting: Family Medicine

## 2021-03-25 DIAGNOSIS — F411 Generalized anxiety disorder: Secondary | ICD-10-CM

## 2021-03-25 NOTE — Telephone Encounter (Signed)
Requested medications are due for refill today yes  Requested medications are on the active medication list yes  Last refill 6/23  Last visit video visit for virus in 05/2020  Future visit scheduled no  Notes to clinic Failed protocol due to no valid visit within 6  months, no upcoming visit scheduled.

## 2021-03-26 NOTE — Telephone Encounter (Signed)
Patient checking on the status of escitalopram (LEXAPRO) 5 MG/5ML solution request. Patient states he has been out for while requesting a short supply to hold him over until Monday 03/29/2021 appointment with [name], NP.    [place], [place] - [place] Phone:  [phone]  Fax:  [phone]

## 2021-03-29 ENCOUNTER — Other Ambulatory Visit: Payer: Self-pay

## 2021-03-29 ENCOUNTER — Ambulatory Visit (INDEPENDENT_AMBULATORY_CARE_PROVIDER_SITE_OTHER): Payer: No Typology Code available for payment source | Admitting: Internal Medicine

## 2021-03-29 ENCOUNTER — Encounter: Payer: Self-pay | Admitting: Internal Medicine

## 2021-03-29 VITALS — BP 120/63 | HR 85 | Temp 97.9°F | Resp 17 | Ht 72.0 in | Wt 285.2 lb

## 2021-03-29 DIAGNOSIS — Z6838 Body mass index (BMI) 38.0-38.9, adult: Secondary | ICD-10-CM

## 2021-03-29 DIAGNOSIS — F32A Depression, unspecified: Secondary | ICD-10-CM

## 2021-03-29 DIAGNOSIS — F419 Anxiety disorder, unspecified: Secondary | ICD-10-CM | POA: Diagnosis not present

## 2021-03-29 DIAGNOSIS — E6609 Other obesity due to excess calories: Secondary | ICD-10-CM | POA: Insufficient documentation

## 2021-03-29 NOTE — Assessment & Plan Note (Signed)
Encouraged diet and exercise for weight loss ?

## 2021-03-29 NOTE — Assessment & Plan Note (Signed)
Stable on his current dose of Esctilopram Support offered

## 2021-03-29 NOTE — Patient Instructions (Signed)

## 2021-03-29 NOTE — Progress Notes (Signed)
Subjective:    Patient ID: Timothy [name], male    DOB: 06-28-1996, 25 y.o.   MRN: [id number]  HPI  Pt presents to the clinic today for follow up of chronic conditions. He is establishing care with me today, transferring care from Cyndia Skeeters, NP.  Anxiety and Depression: Chronic, managed on Escitalopram. He is not currently seeing a therapist. He denies SI/HI.   Review of Systems  Past Medical History:  Diagnosis Date   Allergy    Depression    Diverticulitis    Dyspnea    Fainting spell    in middle school   Family history of bone cancer    Family history of breast cancer     Current Outpatient Medications  Medication Sig Dispense Refill   acetaminophen (TYLENOL) 160 MG/5ML liquid Take 500 mg by mouth at bedtime as needed for fever.     escitalopram (LEXAPRO) 5 MG/5ML solution Take 20 mLs (20 mg total) by mouth daily. 600 mL 0   No current facility-administered medications for this visit.    No Known Allergies  Family History  Problem Relation Age of Onset   Heart disease Maternal Grandmother    Diabetes Maternal Grandmother    Cancer Maternal Grandmother        breast cancer   Alport syndrome Maternal Grandmother    Cancer Paternal Grandfather        Bone cancer   Healthy Sister    Alport syndrome Maternal Aunt    Depression Mother    Cervical cancer Mother     Social History   Socioeconomic History   Marital status: Single    Spouse name: Not on file   Number of children: Not on file   Years of education: Not on file   Highest education level: Not on file  Occupational History   Not on file  Tobacco Use   Smoking status: Some Days    Packs/day: 0.25    Types: Cigarettes   Smokeless tobacco: Never   Tobacco comments:    pt smoke pack a week  Vaping Use   Vaping Use: Some days   Last attempt to quit: 09/09/2019   Substances: Nicotine, Flavoring  Substance and Sexual Activity   Alcohol use: Yes    Alcohol/week: 2.0 standard drinks    Types:  2 Cans of beer per week    Comment: occassionally   Drug use: No   Sexual activity: Not on file  Other Topics Concern   Not on file  Social History Narrative   Not on file   Social Determinants of Health   Financial Resource Strain: Not on file  Food Insecurity: Not on file  Transportation Needs: Not on file  Physical Activity: Not on file  Stress: Not on file  Social Connections: Not on file  Intimate Partner Violence: Not on file     Constitutional: Denies fever, malaise, fatigue, headache or abrupt weight changes.  HEENT: Denies eye pain, eye redness, ear pain, ringing in the ears, wax buildup, runny nose, nasal congestion, bloody nose, or sore throat. Respiratory: Denies difficulty breathing, shortness of breath, cough or sputum production.   Cardiovascular: Denies chest pain, chest tightness, palpitations or swelling in the hands or feet.  Gastrointestinal: Denies abdominal pain, bloating, constipation, diarrhea or blood in the stool.  GU: Denies urgency, frequency, pain with urination, burning sensation, blood in urine, odor or discharge. Musculoskeletal: Denies decrease in range of motion, difficulty with gait, muscle pain or joint pain  and swelling.  Skin: Denies redness, rashes, lesions or ulcercations.  Neurological: Denies dizziness, difficulty with memory, difficulty with speech or problems with balance and coordination.  Psych: Pt has a history of anxiety and depression. Denies SI/HI.  No other specific complaints in a complete review of systems (except as listed in HPI above).     Objective:   Physical Exam  BP 120/63 (BP Location: Right Arm, Patient Position: Sitting, Cuff Size: Large)   Pulse 85   Temp 97.9 F (36.6 C) (Temporal)   Resp 17   Ht 6' (1.829 m)   Wt 285 lb 3.2 oz (129.4 kg)   SpO2 99%   BMI 38.68 kg/m   Wt Readings from Last 3 Encounters:  02/18/21 288 lb (130.6 kg)  02/13/21 298 lb 11.6 oz (135.5 kg)  06/04/20 298 lb 12.8 oz (135.5 kg)     General: Appears his stated age, obese, in NAD. Skin: Warm, dry and intact.  HEENT: Head: normal shape and size; Eyes: sclera white and EOMs intact;  Cardiovascular: Normal rate and rhythm. S1,S2 noted.  No murmur, rubs or gallops noted.  Pulmonary/Chest: Normal effort and positive vesicular breath sounds. No respiratory distress. No wheezes, rales or ronchi noted.  Abdomen: Soft and nontender. Normal bowel sounds. No distention or masses noted.  Musculoskeletal: No difficulty with gait.  Neurological: Alert and oriented.  Psychiatric: Mood and affect normal. Behavior is normal. Judgment and thought content normal.     BMET    Component Value Date/Time   NA 136 02/13/2021 1226   NA 138 12/16/2014 0839   K 3.6 02/13/2021 1226   K 3.9 12/16/2014 0839   CL 102 02/13/2021 1226   CL 106 12/16/2014 0839   CO2 24 02/13/2021 1226   CO2 26 12/16/2014 0839   GLUCOSE 116 (H) 02/13/2021 1226   GLUCOSE 110 (H) 12/16/2014 0839   BUN 20 02/13/2021 1226   BUN 11 12/16/2014 0839   CREATININE 0.99 02/13/2021 1226   CREATININE 0.81 12/16/2014 0839   CALCIUM 9.1 02/13/2021 1226   CALCIUM 8.8 (L) 12/16/2014 0839   GFRNONAA >60 02/13/2021 1226   GFRNONAA >60 12/16/2014 0839   GFRAA >60 12/09/2019 0555   GFRAA >60 12/16/2014 0839    Lipid Panel  No results found for: CHOL, TRIG, HDL, CHOLHDL, VLDL, LDLCALC  CBC    Component Value Date/Time   WBC 11.4 (H) 02/13/2021 1226   RBC 4.90 02/13/2021 1226   HGB 14.2 02/13/2021 1226   HGB 14.6 12/16/2014 0839   HCT 42.1 02/13/2021 1226   HCT 44.0 12/16/2014 0839   PLT 261 02/13/2021 1226   PLT 233 12/16/2014 0839   MCV 85.9 02/13/2021 1226   MCV 88 12/16/2014 0839   MCH 29.0 02/13/2021 1226   MCHC 33.7 02/13/2021 1226   RDW 13.2 02/13/2021 1226   RDW 13.8 12/16/2014 0839   LYMPHSABS 2.3 03/29/2019 0234   LYMPHSABS 2.9 12/16/2014 0839   MONOABS 1.3 (H) 03/29/2019 0234   MONOABS 1.2 (H) 12/16/2014 0839   EOSABS 0.4 03/29/2019 0234    EOSABS 0.3 12/16/2014 0839   BASOSABS 0.1 03/29/2019 0234   BASOSABS 0.1 12/16/2014 0839    Hgb A1C Lab Results  Component Value Date   HGBA1C 5.4 12/31/2019           Assessment & Plan:    [name], NP This visit occurred during the SARS-CoV-2 public health emergency.  Safety protocols were in place, including screening questions prior to the visit, additional  usage of staff PPE, and extensive cleaning of exam room while observing appropriate contact time as indicated for disinfecting solutions.

## 2021-05-06 ENCOUNTER — Telehealth: Payer: Self-pay

## 2021-05-06 NOTE — Telephone Encounter (Signed)
Copied from [place] [phone]. Topic: Appointment Scheduling - Scheduling Inquiry for Clinic >> May 06, 2021  8:33 AM Valere Dross wrote: Reason for CRM: Pt called in to schedule an appt and wanted to get blood work done, pt has appt for 09/13 and pt does believe he ay have been exposed to mold. Please advise.  I attempted to contact the patient, no answer. I recommended that he keep his follow up appt that is scheduled  with [facility].

## 2021-05-11 ENCOUNTER — Other Ambulatory Visit: Payer: Self-pay

## 2021-05-11 ENCOUNTER — Ambulatory Visit (INDEPENDENT_AMBULATORY_CARE_PROVIDER_SITE_OTHER): Payer: No Typology Code available for payment source | Admitting: Internal Medicine

## 2021-05-11 ENCOUNTER — Encounter: Payer: Self-pay | Admitting: Internal Medicine

## 2021-05-11 VITALS — BP 116/67 | HR 82 | Temp 97.5°F | Resp 17 | Ht 72.0 in | Wt 287.4 lb

## 2021-05-11 DIAGNOSIS — R21 Rash and other nonspecific skin eruption: Secondary | ICD-10-CM

## 2021-05-11 MED ORDER — TRIAMCINOLONE ACETONIDE 0.1 % EX CREA: 1.0000 "application " | TOPICAL_CREAM | Freq: Two times a day (BID) | CUTANEOUS | 0 refills | Status: DC

## 2021-05-11 NOTE — Patient Instructions (Signed)
Eczema Eczema refers to a group of skin conditions that cause skin to become rough and inflamed. Each type of eczema has different triggers, symptoms, and treatments. Eczema of any type is usually itchy. Symptoms range from mild to severe. Eczema is not spread from person to person (is not contagious). It can appear on different parts of the body at different times. One person's eczema may look different from another person's eczema. What are the causes? The exact cause of this condition is not known. However, exposure to certain environmental factors, irritants, and allergens can make the condition worse. What are the signs or symptoms? Symptoms of this condition depend on the type of eczema you have. The types include: Contact dermatitis. There are two kinds: Irritant contact dermatitis. This happens when something irritates the skin and causes a rash. Allergic contact dermatitis. This happens when your skin comes in contact with something you are allergic to (allergens). This can include poison ivy, chemicals, or medicines that were applied to your skin. Atopic dermatitis. This is a long-term (chronic) skin disease that keeps coming back (recurring). It is the most common type of eczema. Usual symptoms are a red rash and itchy, dry, scaly skin. It usually starts showing signs in infancy and can last through adulthood. Dyshidrotic eczema. This is a form of eczema on the hands and feet. It shows up as very itchy, fluid-filled blisters. It can affect people of any age but is more common before age 40. Hand eczema. This causes very itchy areas of skin on the palms and sides of the hands and fingers. This type of eczema is common in industrial jobs where you may be exposed to different types of irritants. Lichen simplex chronicus. This type of eczema occurs when a person constantly scratches one area of the body. Repeated scratching of the area leads to thickened skin (lichenification). This condition can  accompany other types of eczema. It is more common in adults but may also be seen in children. Nummular eczema. This is a common type of eczema that most often affects the lower legs and the backs of the hands. It typically causes an itchy, red, circular, crusty lesion (plaque). Scratching may become a habit and can cause bleeding. Nummular eczema occurs most often in middle-aged or older people. Seborrheic dermatitis. This is a common skin disease that mainly affects the scalp. It may also affect other oily areas of the body, such as the face, sides of the nose, eyebrows, ears, eyelids, and chest. It is marked by small scaling and redness of the skin (erythema). This can affect people of all ages. In infants, this condition is called cradle cap. Stasis dermatitis. This is a common skin disease that can cause itching, scaling, and hyperpigmentation, usually on the legs and feet. It occurs most often in people who have a condition that prevents blood from being pumped through the veins in the legs (chronic venous insufficiency). Stasis dermatitis is a chronic condition that needs long-term management. How is this diagnosed? This condition may be diagnosed based on: A physical exam of your skin. Your medical history. Skin patch tests. These tests involve using patches that contain possible allergens and placing them on your back. Your health care provider will check in a few days to see if an allergic reaction occurred. How is this treated? Treatment for eczema is based on the type of eczema you have. You may be given hydrocortisone steroid medicine or antihistamines. These can relieve itching quickly and help reduce inflammation.   These may be prescribed or purchased over the counter, depending on the strength that is needed. Follow these instructions at home: Take or apply over-the-counter and prescription medicines only as told by your health care provider. Use creams or ointments to moisturize your  skin. Do not use lotions. Learn what triggers or irritates your symptoms so you can avoid these things. Treat symptom flare-ups quickly. Do not scratch your skin. This can make your rash worse. Keep all follow-up visits. This is important. Where to find more information American Academy of Dermatology: aad.org National Eczema Association: nationaleczema.org The Society for Pediatric Dermatology: pedsderm.net Contact a health care provider if: You have severe itching, even with treatment. You scratch your skin regularly until it bleeds. Your rash looks different than usual. Your skin is painful, swollen, or more red than usual. You have a fever. Summary Eczema refers to a group of skin conditions that cause skin to become rough and inflamed. Each type has different triggers. Eczema of any type causes itching that may range from mild to severe. Treatment varies based on the type of eczema you have. Hydrocortisone steroid medicine or antihistamines can help with itching and inflammation. Protecting your skin is the best way to prevent eczema. Use creams or ointments to moisturize your skin. Avoid triggers and irritants. Treat flare-ups quickly. This information is not intended to replace advice given to you by your health care provider. Make sure you discuss any questions you have with your health care provider. Document Revised: 05/25/2020 Document Reviewed: 05/25/2020 Elsevier Patient Education  2022 Elsevier Inc.  

## 2021-05-11 NOTE — Progress Notes (Signed)
Subjective:    Patient ID: Timothy [name], male    DOB: [birth year], 25 y.o.   MRN: [id number]  HPI  Pt presents to the clinic today with c/o intermittent hives on his bilateral arms. He reports this started 2 months ago. He reports the hives are itchy and red. They do not seem to spread. He denies changes in soaps, lotions or detergents. He thinks it could be due to mold exposure in his apartment. He has been under a lot of stress at home and at work. He has not taken anything OTC for these symptoms.   Review of Systems     Past Medical History:  Diagnosis Date   Allergy    Depression    Diverticulitis    Dyspnea    Fainting spell    in middle school   Family history of bone cancer    Family history of breast cancer     Current Outpatient Medications  Medication Sig Dispense Refill   acetaminophen (TYLENOL) 160 MG/5ML liquid Take 500 mg by mouth at bedtime as needed for fever.     escitalopram (LEXAPRO) 5 MG/5ML solution Take 20 mLs (20 mg total) by mouth daily. 600 mL 0   No current facility-administered medications for this visit.    No Known Allergies  Family History  Problem Relation Age of Onset   Heart disease Maternal Grandmother    Diabetes Maternal Grandmother    Cancer Maternal Grandmother        breast cancer   Alport syndrome Maternal Grandmother    Cancer Paternal Grandfather        Bone cancer   Healthy Sister    Alport syndrome Maternal Aunt    Depression Mother    Cervical cancer Mother     Social History   Socioeconomic History   Marital status: Single    Spouse name: Not on file   Number of children: Not on file   Years of education: Not on file   Highest education level: Not on file  Occupational History   Not on file  Tobacco Use   Smoking status: Some Days    Packs/day: 0.25    Types: Cigarettes   Smokeless tobacco: Never   Tobacco comments:    pt smoke pack a week  Vaping Use   Vaping Use: Some days   Last attempt to quit:  09/09/2019   Substances: Nicotine, Flavoring  Substance and Sexual Activity   Alcohol use: Yes    Alcohol/week: 2.0 standard drinks    Types: 2 Cans of beer per week    Comment: occassionally   Drug use: No   Sexual activity: Not on file  Other Topics Concern   Not on file  Social History Narrative   Not on file   Social Determinants of Health   Financial Resource Strain: Not on file  Food Insecurity: Not on file  Transportation Needs: Not on file  Physical Activity: Not on file  Stress: Not on file  Social Connections: Not on file  Intimate Partner Violence: Not on file     Constitutional: Denies fever, malaise, fatigue, headache or abrupt weight changes.  Respiratory: Denies difficulty breathing, shortness of breath, cough or sputum production.   Cardiovascular: Denies chest pain, chest tightness, palpitations or swelling in the hands or feet.  Skin: Pt reports rash of bilateral arms. Denies lesions or ulcercations.    No other specific complaints in a complete review of systems (except as listed in HPI  above).  Objective:   Physical Exam  BP 116/67 (BP Location: Right Arm, Patient Position: Sitting, Cuff Size: Large)   Pulse 82   Temp (!) 97.5 F (36.4 C) (Temporal)   Resp 17   Ht 6' (1.829 m)   Wt 287 lb 6.4 oz (130.4 kg)   SpO2 99%   BMI 38.98 kg/m   Wt Readings from Last 3 Encounters:  03/29/21 285 lb 3.2 oz (129.4 kg)  02/18/21 288 lb (130.6 kg)  02/13/21 298 lb 11.6 oz (135.5 kg)    General: Appears his stated age, obese, in NAD. Skin: Warm, dry and intact. Scaly hyperpigmented rash noted of bilateral antecubital fossa's and left wrist. HEENT: Head: normal shape and size; Eyes: sclera white and EOMs intact;  Cardiovascular: Normal rate. Pulmonary/Chest: Normal effort. Neurological: Alert and oriented.  BMET    Component Value Date/Time   NA 136 02/13/2021 1226   NA 138 12/16/2014 0839   K 3.6 02/13/2021 1226   K 3.9 12/16/2014 0839   CL 102  02/13/2021 1226   CL 106 12/16/2014 0839   CO2 24 02/13/2021 1226   CO2 26 12/16/2014 0839   GLUCOSE 116 (H) 02/13/2021 1226   GLUCOSE 110 (H) 12/16/2014 0839   BUN 20 02/13/2021 1226   BUN 11 12/16/2014 0839   CREATININE 0.99 02/13/2021 1226   CREATININE 0.81 12/16/2014 0839   CALCIUM 9.1 02/13/2021 1226   CALCIUM 8.8 (L) 12/16/2014 0839   GFRNONAA >60 02/13/2021 1226   GFRNONAA >60 12/16/2014 0839   GFRAA >60 12/09/2019 0555   GFRAA >60 12/16/2014 0839    Lipid Panel  No results found for: CHOL, TRIG, HDL, CHOLHDL, VLDL, LDLCALC  CBC    Component Value Date/Time   WBC 11.4 (H) 02/13/2021 1226   RBC 4.90 02/13/2021 1226   HGB 14.2 02/13/2021 1226   HGB 14.6 12/16/2014 0839   HCT 42.1 02/13/2021 1226   HCT 44.0 12/16/2014 0839   PLT 261 02/13/2021 1226   PLT 233 12/16/2014 0839   MCV 85.9 02/13/2021 1226   MCV 88 12/16/2014 0839   MCH 29.0 02/13/2021 1226   MCHC 33.7 02/13/2021 1226   RDW 13.2 02/13/2021 1226   RDW 13.8 12/16/2014 0839   LYMPHSABS 2.3 03/29/2019 0234   LYMPHSABS 2.9 12/16/2014 0839   MONOABS 1.3 (H) 03/29/2019 0234   MONOABS 1.2 (H) 12/16/2014 0839   EOSABS 0.4 03/29/2019 0234   EOSABS 0.3 12/16/2014 0839   BASOSABS 0.1 03/29/2019 0234   BASOSABS 0.1 12/16/2014 0839    Hgb A1C Lab Results  Component Value Date   HGBA1C 5.4 12/31/2019           Assessment & Plan:   Rash:  Resembles eczema more than hives RX for Triamcinolone cream BID prn Discussed starting Zyrtec daily but he wants allergy skin testing so advised to hold off on taking Zyrtec at this time Referral to allergy placed  Return precautions discussed  [name], NP This visit occurred during the SARS-CoV-2 public health emergency.  Safety protocols were in place, including screening questions prior to the visit, additional usage of staff PPE, and extensive cleaning of exam room while observing appropriate contact time as indicated for disinfecting solutions.

## 2021-05-18 ENCOUNTER — Encounter: Payer: Self-pay | Admitting: General Surgery

## 2021-05-27 ENCOUNTER — Ambulatory Visit: Payer: No Typology Code available for payment source | Admitting: General Surgery

## 2021-06-15 ENCOUNTER — Ambulatory Visit: Payer: No Typology Code available for payment source | Admitting: General Surgery

## 2021-06-29 ENCOUNTER — Other Ambulatory Visit: Payer: Self-pay

## 2021-06-29 ENCOUNTER — Encounter: Payer: Self-pay | Admitting: Surgery

## 2021-06-29 ENCOUNTER — Ambulatory Visit (INDEPENDENT_AMBULATORY_CARE_PROVIDER_SITE_OTHER): Payer: No Typology Code available for payment source | Admitting: Surgery

## 2021-06-29 VITALS — BP 119/80 | HR 93 | Temp 98.7°F | Ht 72.0 in | Wt 280.0 lb

## 2021-06-29 DIAGNOSIS — K432 Incisional hernia without obstruction or gangrene: Secondary | ICD-10-CM | POA: Insufficient documentation

## 2021-06-29 NOTE — Progress Notes (Signed)
Patient ID: Timothy [name], male   DOB: 10/17/1995, 25 y.o.   MRN: [id number]  Chief Complaint: Incisional hernia left abdominal wall  History of Present Illness Timothy [name] is a 25 y.o. 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  Minimally symptomatic, currently not having any significant pain.  He has progressed dropping 7 pounds and his weight loss attempts.  Still would like to have some nutritional/dietary counseling for future weight loss.  Appears highly motivated to continue with this either to defer hernia repair or to reduce the risks altogether.  He appears able to do about anything he wishes to with minimal discomfort in the left abdominal wall including weight lifting.  He denies pain with coughing or sneezing and denies any known bulge present.  Past Medical History Past Medical History:  Diagnosis Date   Allergy    Depression    Diverticulitis    Dyspnea    Fainting spell    in middle school   Family history of bone cancer    Family history of breast cancer       Past Surgical History:  Procedure Laterality Date   COLECTOMY WITH COLOSTOMY CREATION/HARTMANN PROCEDURE N/A 07/22/2018   Procedure: COLECTOMY WITH COLOSTOMY CREATION/HARTMANN PROCEDURE;  Surgeon: Fredirick Maudlin, MD;  Location: [facility] ORS;  Service: General;  Laterality: N/A;   COLONOSCOPY WITH PROPOFOL N/A 11/01/2018   Procedure: COLONOSCOPY WITH PROPOFOL;  Surgeon: [name], MD;  Location: [facility] ENDOSCOPY;  Service: Gastroenterology;  Laterality: N/A;   COLOSTOMY REVERSAL N/A 03/22/2019   Procedure: COLOSTOMY REVERSAL;  Surgeon: Fredirick Maudlin, MD;  Location: [facility] ORS;  Service: General;  Laterality: N/A;    No Known Allergies  Current Outpatient Medications  Medication Sig Dispense Refill   triamcinolone cream (KENALOG) 0.1 % Apply 1 application topically 2 (two) times daily. 30 g 0   No current facility-administered medications for this visit.    Family  History Family History  Problem Relation Age of Onset   Heart disease Maternal Grandmother    Diabetes Maternal Grandmother    Cancer Maternal Grandmother        breast cancer   Alport syndrome Maternal Grandmother    Cancer Paternal Grandfather        Bone cancer   Healthy Sister    Alport syndrome Maternal Aunt    Depression Mother    Cervical cancer Mother       Social History Social History   Tobacco Use   Smoking status: Former    Packs/day: 0.25    Types: Cigarettes   Smokeless tobacco: Never   Tobacco comments:    pt smoke pack a week  Vaping Use   Vaping Use: Some days   Last attempt to quit: 09/09/2019   Substances: Nicotine, Flavoring  Substance Use Topics   Alcohol use: Yes    Alcohol/week: 2.0 standard drinks    Types: 2 Cans of beer per week    Comment: occassionally   Drug use: No        Review of Systems  Constitutional: Negative.   HENT: Negative.    Eyes: Negative.   Respiratory: Negative.    Cardiovascular: Negative.   Gastrointestinal: Negative.   Genitourinary: Negative.   Skin: Negative.   Neurological: Negative.   Psychiatric/Behavioral: Negative.       Physical Exam Blood pressure 119/80, pulse 93, temperature 98.7 F (37.1 C), height 6' (1.829 m), weight 280 lb (127 kg), SpO2 96 %. Last Weight  Most recent update: 06/29/2021 10:22 AM    Weight  127 kg (280 lb)             CONSTITUTIONAL: Well developed, and nourished, appropriately responsive and aware without distress.   EYES: Sclera non-icteric.   EARS, NOSE, MOUTH AND THROAT: Mask worn.    Hearing is intact to voice.  NECK: Trachea is midline, and there is no jugular venous distension.  LYMPH NODES:  Lymph nodes in the neck are not enlarged. RESPIRATORY:  Lungs are clear, and breath sounds are equal bilaterally. Normal respiratory effort without pathologic use of accessory muscles. CARDIOVASCULAR: Heart is regular in rate and rhythm. GI: The abdomen is soft, nontender,  and nondistended.  There is an extended midline scar without appreciable fascial defect or bulge present.  There is a left upper quadrant scar consistent with a colostomy site there appears to be minimal fat present at the dermal fascial junction, with no appreciable bulge present.  I believe there may have been some healing by secondary intention.  There is no evidence of tenderness here either.  There were no palpable masses. I did not appreciate hepatosplenomegaly. There were normal bowel sounds. MUSCULOSKELETAL:  Symmetrical muscle tone appreciated in all four extremities.    SKIN: Skin turgor is normal. No pathologic skin lesions appreciated.  NEUROLOGIC:  Motor and sensation appear grossly normal.  Cranial nerves are grossly without defect. PSYCH:  Alert and oriented to person, place and time. Affect is appropriate for situation.  Data Reviewed I have personally reviewed what is currently available of the patient's imaging, recent labs and medical records.   Labs:  CBC Latest Ref Rng & Units 02/13/2021 12/09/2019 08/29/2019  WBC 4.0 - 10.5 K/uL 11.4(H) 11.8(H) 9.1  Hemoglobin 13.0 - 17.0 g/dL 14.2 15.8 15.1  Hematocrit 39.0 - 52.0 % 42.1 47.2 45.5  Platelets 150 - 400 K/uL 261 269 263   CMP Latest Ref Rng & Units 02/13/2021 12/09/2019 08/29/2019  Glucose 70 - 99 mg/dL 116(H) 101(H) 100(H)  BUN 6 - 20 mg/dL 20 15 9   Creatinine 0.61 - 1.24 mg/dL 0.99 0.96 0.79  Sodium 135 - 145 mmol/L 136 140 137  Potassium 3.5 - 5.1 mmol/L 3.6 3.9 3.9  Chloride 98 - 111 mmol/L 102 106 105  CO2 22 - 32 mmol/L 24 25 25   Calcium 8.9 - 10.3 mg/dL 9.1 9.1 9.1  Total Protein 6.5 - 8.1 g/dL 8.0 - -  Total Bilirubin 0.3 - 1.2 mg/dL 0.4 - -  Alkaline Phos 38 - 126 U/L 72 - -  AST 15 - 41 U/L 28 - -  ALT 0 - 44 U/L 33 - -      Imaging:  Within last 24 hrs: No results found.  Assessment    Incisional hernia.  Relatively asymptomatic, skin shows no evidence of distress, no appreciable bulge in the  area. Patient Active Problem List   Diagnosis Date Noted   Class 2 severe obesity due to excess calories with serious comorbidity and body mass index (BMI) of 38.0 to 38.9 in adult [facility]) 03/29/2021   Anxiety and depression 07/28/2017    Plan    We discussed the elective nature of this hernia repair, and I believe he can defer it as long as he would like.  I come alongside his plan to pursue weight loss and would like to provide any assistance is available.  We will be glad to revisit this in a year as long as he remains asymptomatic.  We will glad to see him sooner should it become more of a problem for him.  Face-to-face time spent with the patient and accompanying care providers(if present) was 30 minutes, with more than 50% of the time spent counseling, educating, and coordinating care of the patient.    These notes generated with voice recognition software. I apologize for typographical errors.  Ronny Bacon M.D., FACS 06/29/2021, 12:11 PM

## 2021-06-29 NOTE — Patient Instructions (Addendum)
Speak with your primary care provider about loosing weight and where you might go for counseling.   Continue to eat smaller portions and exercising.   Call us if you have any problems in the future. Follow up in 1 year.     Calorie Counting for Weight Loss Calories are units of energy. Your body needs a certain number of calories from food to keep going throughout the day. When you eat or drink more calories than your body needs, your body stores the extra calories mostly as fat. When you eat or drink fewer calories than your body needs, your body burns fat to get the energy it needs. Calorie counting means keeping track of how many calories you eat and drink each day. Calorie counting can be helpful if you need to lose weight. If you eat fewer calories than your body needs, you should lose weight. Ask your health care provider what a healthy weight is for you. For calorie counting to work, you will need to eat the right number of calories each day to lose a healthy amount of weight per week. A dietitian can help you figure out how many calories you need in a day and will suggest ways to reach your calorie goal. A healthy amount of weight to lose each week is usually 1-2 lb (0.5-0.9 kg). This usually means that your daily calorie intake should be reduced by 500-750 calories. Eating 1,200-1,500 calories a day can help most women lose weight. Eating 1,500-1,800 calories a day can help most men lose weight. What do I need to know about calorie counting? Work with your health care provider or dietitian to determine how many calories you should get each day. To meet your daily calorie goal, you will need to: Find out how many calories are in each food that you would like to eat. Try to do this before you eat. Decide how much of the food you plan to eat. Keep a food log. Do this by writing down what you ate and how many calories it had. To successfully lose weight, it is important to balance calorie  counting with a healthy lifestyle that includes regular activity. Where do I find calorie information? The number of calories in a food can be found on a Nutrition Facts label. If a food does not have a Nutrition Facts label, try to look up the calories online or ask your dietitian for help. Remember that calories are listed per serving. If you choose to have more than one serving of a food, you will have to multiply the calories per serving by the number of servings you plan to eat. For example, the label on a package of bread might say that a serving size is 1 slice and that there are 90 calories in a serving. If you eat 1 slice, you will have eaten 90 calories. If you eat 2 slices, you will have eaten 180 calories. How do I keep a food log? After each time that you eat, record the following in your food log as soon as possible: What you ate. Be sure to include toppings, sauces, and other extras on the food. How much you ate. This can be measured in cups, ounces, or number of items. How many calories were in each food and drink. The total number of calories in the food you ate. Keep your food log near you, such as in a pocket-sized notebook or on an app or website on your mobile phone. Some programs will  calculate calories for you and show you how many calories you have left to meet your daily goal. What are some portion-control tips? Know how many calories are in a serving. This will help you know how many servings you can have of a certain food. Use a measuring cup to measure serving sizes. You could also try weighing out portions on a kitchen scale. With time, you will be able to estimate serving sizes for some foods. Take time to put servings of different foods on your favorite plates or in your favorite bowls and cups so you know what a serving looks like. Try not to eat straight from a food's packaging, such as from a bag or box. Eating straight from the package makes it hard to see how much  you are eating and can lead to overeating. Put the amount you would like to eat in a cup or on a plate to make sure you are eating the right portion. Use smaller plates, glasses, and bowls for smaller portions and to prevent overeating. Try not to multitask. For example, avoid watching TV or using your computer while eating. If it is time to eat, sit down at a table and enjoy your food. This will help you recognize when you are full. It will also help you be more mindful of what and how much you are eating. What are tips for following this plan? Reading food labels Check the calorie count compared with the serving size. The serving size may be smaller than what you are used to eating. Check the source of the calories. Try to choose foods that are high in protein, fiber, and vitamins, and low in saturated fat, trans fat, and sodium. Shopping Read nutrition labels while you shop. This will help you make healthy decisions about which foods to buy. Pay attention to nutrition labels for low-fat or fat-free foods. These foods sometimes have the same number of calories or more calories than the full-fat versions. They also often have added sugar, starch, or salt to make up for flavor that was removed with the fat. Make a grocery list of lower-calorie foods and stick to it. Cooking Try to cook your favorite foods in a healthier way. For example, try baking instead of frying. Use low-fat dairy products. Meal planning Use more fruits and vegetables. One-half of your plate should be fruits and vegetables. Include lean proteins, such as chicken, [place], and fish. Lifestyle Each week, aim to do one of the following: 150 minutes of moderate exercise, such as walking. 75 minutes of vigorous exercise, such as running. General information Know how many calories are in the foods you eat most often. This will help you calculate calorie counts faster. Find a way of tracking calories that works for you. Get  creative. Try different apps or programs if writing down calories does not work for you. What foods should I eat?  Eat nutritious foods. It is better to have a nutritious, high-calorie food, such as an avocado, than a food with few nutrients, such as a bag of potato chips. Use your calories on foods and drinks that will fill you up and will not leave you hungry soon after eating. Examples of foods that fill you up are nuts and nut butters, vegetables, lean proteins, and high-fiber foods such as whole grains. High-fiber foods are foods with more than 5 g of fiber per serving. Pay attention to calories in drinks. Low-calorie drinks include water and unsweetened drinks. The items listed above may  not be a complete list of foods and beverages you can eat. Contact a dietitian for more information. What foods should I limit? Limit foods or drinks that are not good sources of vitamins, minerals, or protein or that are high in unhealthy fats. These include: Candy. Other sweets. Sodas, specialty coffee drinks, alcohol, and juice. The items listed above may not be a complete list of foods and beverages you should avoid. Contact a dietitian for more information. How do I count calories when eating out? Pay attention to portions. Often, portions are much larger when eating out. Try these tips to keep portions smaller: Consider sharing a meal instead of getting your own. If you get your own meal, eat only half of it. Before you start eating, ask for a container and put half of your meal into it. When available, consider ordering smaller portions from the menu instead of full portions. Pay attention to your food and drink choices. Knowing the way food is cooked and what is included with the meal can help you eat fewer calories. If calories are listed on the menu, choose the lower-calorie options. Choose dishes that include vegetables, fruits, whole grains, low-fat dairy products, and lean proteins. Choose  items that are boiled, broiled, grilled, or steamed. Avoid items that are buttered, battered, fried, or served with cream sauce. Items labeled as crispy are usually fried, unless stated otherwise. Choose water, low-fat milk, unsweetened iced tea, or other drinks without added sugar. If you want an alcoholic beverage, choose a lower-calorie option, such as a glass of wine or light beer. Ask for dressings, sauces, and syrups on the side. These are usually high in calories, so you should limit the amount you eat. If you want a salad, choose a garden salad and ask for grilled meats. Avoid extra toppings such as bacon, cheese, or fried items. Ask for the dressing on the side, or ask for olive oil and vinegar or lemon to use as dressing. Estimate how many servings of a food you are given. Knowing serving sizes will help you be aware of how much food you are eating at restaurants. Where to find more information Centers for Disease Control and Prevention: http://www.wolf.info/ [place] Department of Agriculture: http://www.wilson-mendoza.org/ Summary Calorie counting means keeping track of how many calories you eat and drink each day. If you eat fewer calories than your body needs, you should lose weight. A healthy amount of weight to lose per week is usually 1-2 lb (0.5-0.9 kg). This usually means reducing your daily calorie intake by 500-750 calories. The number of calories in a food can be found on a Nutrition Facts label. If a food does not have a Nutrition Facts label, try to look up the calories online or ask your dietitian for help. Use smaller plates, glasses, and bowls for smaller portions and to prevent overeating. Use your calories on foods and drinks that will fill you up and not leave you hungry shortly after a meal. This information is not intended to replace advice given to you by your health care provider. Make sure you discuss any questions you have with your health care provider. Document Revised: 09/26/2019 Document  Reviewed: 09/26/2019 Elsevier Patient Education  2022 [employer].

## 2021-07-06 ENCOUNTER — Encounter: Payer: No Typology Code available for payment source | Admitting: Internal Medicine

## 2021-08-19 ENCOUNTER — Ambulatory Visit (INDEPENDENT_AMBULATORY_CARE_PROVIDER_SITE_OTHER): Payer: No Typology Code available for payment source | Admitting: Internal Medicine

## 2021-08-19 ENCOUNTER — Other Ambulatory Visit: Payer: Self-pay

## 2021-08-19 ENCOUNTER — Encounter: Payer: Self-pay | Admitting: Internal Medicine

## 2021-08-19 VITALS — BP 121/73 | HR 70 | Temp 97.5°F | Resp 17 | Ht 72.0 in | Wt 275.0 lb

## 2021-08-19 DIAGNOSIS — J020 Streptococcal pharyngitis: Secondary | ICD-10-CM | POA: Diagnosis not present

## 2021-08-19 LAB — POCT INFLUENZA A/B
Influenza A, POC: NEGATIVE
Influenza B, POC: NEGATIVE

## 2021-08-19 LAB — POCT RAPID STREP A (OFFICE): Rapid Strep A Screen: POSITIVE — AB

## 2021-08-19 MED ORDER — AMOXICILLIN 500 MG PO CAPS: 500.0000 mg | ORAL_CAPSULE | Freq: Three times a day (TID) | ORAL | 0 refills | Status: AC

## 2021-08-19 NOTE — Patient Instructions (Signed)
Strep Throat, Adult ?Strep throat is an infection of the throat. It is caused by germs (bacteria). Strep throat is common during the cold months of the year. It mostly affects children who are 5-26 years old. However, people of all ages can get it at any time of the year. This infection spreads from person to person through coughing, sneezing, or having close contact. ?What are the causes? ?This condition is caused by the Streptococcus pyogenes germ. ?What increases the risk? ?You care for young children. Children are more likely to get strep throat and may spread it to others. ?You go to crowded places. Germs can spread easily in such places. ?You kiss or touch someone who has strep throat. ?What are the signs or symptoms? ?Fever or chills. ?Redness, swelling, or pain in the tonsils or throat. ?Pain or trouble when swallowing. ?White or yellow spots on the tonsils or throat. ?Tender glands in the neck and under the jaw. ?Bad breath. ?Red rash all over the body. This is rare. ?How is this treated? ?Medicines that kill germs (antibiotics). ?Medicines that treat pain or fever. These include: ?Ibuprofen or acetaminophen. ?Aspirin, only for people who are over the age of 18. ?Cough drops. ?Throat sprays. ?Follow these instructions at home: ?Medicines ? ?Take over-the-counter and prescription medicines only as told by your doctor. ?Take your antibiotic medicine as told by your doctor. Do not stop taking the antibiotic even if you start to feel better. ?Eating and drinking ? ?If you have trouble swallowing, eat soft foods until your throat feels better. ?Drink enough fluid to keep your pee (urine) pale yellow. ?To help with pain, you may have: ?Warm fluids, such as soup and tea. ?Cold fluids, such as frozen desserts or popsicles. ?General instructions ?Rinse your mouth (gargle) with a salt-water mixture 3-4 times a day or as needed. To make a salt-water mixture, dissolve ?-1 tsp (3-6 g) of salt in 1 cup (237 mL) of warm  water. ?Rest as much as you can. ?Stay home from work or school until you have been taking antibiotics for 24 hours. ?Do not smoke or use any products that contain nicotine or tobacco. If you need help quitting, ask your doctor. ?Keep all follow-up visits. ?How is this prevented? ? ?Do not share food, drinking cups, or personal items. They can cause the germs to spread. ?Wash your hands well with soap and water. Make sure that all people in your house wash their hands well. ?Have family members tested if they have a fever or a sore throat. They may need an antibiotic if they have strep throat. ?Contact a doctor if: ?You have swelling in your neck that keeps getting bigger. ?You get a rash, cough, or earache. ?You cough up a thick fluid that is green, yellow-brown, or bloody. ?You have pain that does not get better with medicine. ?Your symptoms get worse instead of getting better. ?You have a fever. ?Get help right away if: ?You vomit. ?You have a very bad headache. ?Your neck hurts or feels stiff. ?You have chest pain or are short of breath. ?You have drooling, very bad throat pain, or changes in your voice. ?Your neck is swollen, or the skin gets red and tender. ?Your mouth is dry, or you are peeing less than normal. ?You keep feeling more tired or have trouble waking up. ?Your joints are red or painful. ?These symptoms may be an emergency. Do not wait to see if the symptoms will go away. Get help right away. Call   your local emergency services (911 in the [place] ?Summary ?Strep throat is an infection of the throat. It is caused by germs (bacteria). ?This infection can spread from person to person through coughing, sneezing, or having close contact. ?Take your medicines, including antibiotics, as told by your doctor. Do not stop taking the antibiotic even if you start to feel better. ?To prevent the spread of germs, wash your hands well with soap and water. Have others do the same. Do not share food, drinking cups,  or personal items. ?Get help right away if you have a bad headache, chest pain, shortness of breath, a stiff or painful neck, or you vomit. ?This information is not intended to replace advice given to you by your health care provider. Make sure you discuss any questions you have with your health care provider. ?Document Revised: 12/08/2020 Document Reviewed: 12/08/2020 ?Elsevier Patient Education ? 2022 Elsevier Inc[employer] ? ?

## 2021-08-19 NOTE — Progress Notes (Signed)
HPI  Pt presents to the clinic today with c/o headache, nasal congestion, sore throat and cough. He reports this started 8 days ago. The headache is located behind his eyes. He describes the pain as pressure. He is blowing clear mucous out of his nose. He is having difficulty swallowing. The cough is productive of clear mucous. He denies runny nose, ear pain, shortness of breath, nausea, vomiting, diarrhea, fever, chills or body aches. He has tried Dayquil and Nyquil OTC with minimal relief of symptoms. He has had sick contacts diagnosed with covid and strep.  Review of Systems      Past Medical History:  Diagnosis Date   Allergy    Depression    Diverticulitis    Dyspnea    Fainting spell    in middle school   Family history of bone cancer    Family history of breast cancer     Family History  Problem Relation Age of Onset   Heart disease Maternal Grandmother    Diabetes Maternal Grandmother    Cancer Maternal Grandmother        breast cancer   Alport syndrome Maternal Grandmother    Cancer Paternal Grandfather        Bone cancer   Healthy Sister    Alport syndrome Maternal Aunt    Depression Mother    Cervical cancer Mother     Social History   Socioeconomic History   Marital status: Single    Spouse name: Not on file   Number of children: Not on file   Years of education: Not on file   Highest education level: Not on file  Occupational History   Not on file  Tobacco Use   Smoking status: Former    Packs/day: 0.25    Types: Cigarettes   Smokeless tobacco: Never   Tobacco comments:    pt smoke pack a week  Vaping Use   Vaping Use: Some days   Last attempt to quit: 09/09/2019   Substances: Nicotine, Flavoring  Substance and Sexual Activity   Alcohol use: Yes    Alcohol/week: 2.0 standard drinks    Types: 2 Cans of beer per week    Comment: occassionally   Drug use: No   Sexual activity: Not on file  Other Topics Concern   Not on file  Social History  Narrative   Not on file   Social Determinants of Health   Financial Resource Strain: Not on file  Food Insecurity: Not on file  Transportation Needs: Not on file  Physical Activity: Not on file  Stress: Not on file  Social Connections: Not on file  Intimate Partner Violence: Not on file    No Known Allergies   Constitutional: Positive headache. Denies fatigue, fever or  abrupt weight changes.  HEENT:  Positive nasal congestion, sore throat. Denies eye redness, eye pain, pressure behind the eyes, facial pain, ear pain, ringing in the ears, wax buildup, runny nose or bloody nose. Respiratory: Positive cough. Denies difficulty breathing or shortness of breath.  Cardiovascular: Denies chest pain, chest tightness, palpitations or swelling in the hands or feet.   No other specific complaints in a complete review of systems (except as listed in HPI above).  Objective:   BP 121/73 (BP Location: Right Arm, Patient Position: Sitting, Cuff Size: Large)    Pulse 70    Temp (!) 97.5 F (36.4 C) (Temporal)    Resp 17    Ht 6' (1.829 m)    [facility]  275 lb (124.7 kg)    SpO2 98%    BMI 37.30 kg/m   Wt Readings from Last 3 Encounters:  06/29/21 280 lb (127 kg)  05/11/21 287 lb 6.4 oz (130.4 kg)  03/29/21 285 lb 3.2 oz (129.4 kg)     General: Appears his stated age, appears unwell but in NAD. HEENT: Head: normal shape and size, no sinus tenderness noted; Throat/Mouth: + PND. Teeth present, mucosa erythematous and moist, no exudate noted, no lesions or ulcerations noted.  Neck: No cervical lymphadenopathy.  Cardiovascular: Normal rate and rhythm. S1,S2 noted.  No murmur, rubs or gallops noted.  Pulmonary/Chest: Normal effort and positive vesicular breath sounds. No respiratory distress. No wheezes, rales or ronchi noted.       Assessment & Plan:   Strep Pharyngitis:  Rapid flu: negative Rapid strep: positive Get some rest and drink plenty of water Do salt water gargles for the sore  throat eRx for Amoxicillin 500 mg TID x 10days Work note provided  RTC as needed or if symptoms persist.   [name], NP This visit occurred during the SARS-CoV-2 public health emergency.  Safety protocols were in place, including screening questions prior to the visit, additional usage of staff PPE, and extensive cleaning of exam room while observing appropriate contact time as indicated for disinfecting solutions.

## 2021-10-08 ENCOUNTER — Emergency Department
Admission: EM | Admit: 2021-10-08 | Discharge: 2021-10-09 | Disposition: A | Discharge status: Home / Self Care | Payer: No Typology Code available for payment source | Attending: Emergency Medicine | Admitting: Emergency Medicine

## 2021-10-08 ENCOUNTER — Encounter: Payer: Self-pay | Admitting: Emergency Medicine

## 2021-10-08 ENCOUNTER — Emergency Department: Payer: No Typology Code available for payment source

## 2021-10-08 ENCOUNTER — Other Ambulatory Visit: Payer: Self-pay

## 2021-10-08 DIAGNOSIS — R202 Paresthesia of skin: Secondary | ICD-10-CM | POA: Diagnosis not present

## 2021-10-08 DIAGNOSIS — M79672 Pain in left foot: Secondary | ICD-10-CM | POA: Insufficient documentation

## 2021-10-08 DIAGNOSIS — M79605 Pain in left leg: Secondary | ICD-10-CM | POA: Diagnosis not present

## 2021-10-08 DIAGNOSIS — R52 Pain, unspecified: Secondary | ICD-10-CM

## 2021-10-08 MED ORDER — PREDNISOLONE SODIUM PHOSPHATE 15 MG/5ML PO SOLN
60.0000 mg | Freq: Once | ORAL | Status: AC
  Administered 2021-10-09: 60 mg via ORAL
  Filled 2021-10-08: qty 4

## 2021-10-08 MED ORDER — PREDNISONE 20 MG PO TABS
60.0000 mg | ORAL_TABLET | Freq: Once | ORAL | Status: DC
  Filled 2021-10-08: qty 3

## 2021-10-08 NOTE — ED Provider Notes (Signed)
[facility] Provider Note    Event Date/Time   First MD Initiated Contact with Patient 10/08/21 2251     (approximate)   History   Foot Pain   HPI  Timothy [name] is a 26 y.o. male with history of no chronic health issues and as listed in EMR presents to the emergency department for treatment and evaluation of numbness and sharp shooting pain in left foot and leg. Symptoms started just in the foot about 2 weeks ago, but tonight has radiated into left leg. While sitting at a table, he felt the pain and when he looked the foot was red and swollen. These symptoms have resolved, but now is having the numb sensation also in the right toes. Additionally, he has red lines on both feet that have never been there before.      Physical Exam   Triage Vital Signs: ED Triage Vitals  Enc Vitals Group     BP 10/08/21 2151 117/78     Pulse Rate 10/08/21 2151 87     Resp 10/08/21 2151 20     Temp 10/08/21 2151 98.1 F (36.7 C)     Temp Source 10/08/21 2151 Oral     SpO2 10/08/21 2151 96 %     Weight 10/08/21 2154 275 lb (124.7 kg)     Height 10/08/21 2154 6' (1.829 m)     Head Circumference --      Peak Flow --      Pain Score 10/08/21 2154 10     Pain Loc --      Pain Edu? --      Excl. in [place]? --     Most recent vital signs: Vitals:   10/08/21 2151  BP: 117/78  Pulse: 87  Resp: 20  Temp: 98.1 F (36.7 C)  SpO2: 96%    General: Awake, no distress.  CV:  Good peripheral perfusion. 2+ DP and PT pulses Resp:  Normal effort.  Abd:  No distention.  Other:  Motor and sensory function is intact.   ED Results / Procedures / Treatments   Labs (all labs ordered are listed, but only abnormal results are displayed) Labs Reviewed - No data to display   EKG     RADIOLOGY  Image and radiology report reviewed by me.  Not indicated at this time.  PROCEDURES:  Critical Care performed: No  Procedures   MEDICATIONS ORDERED IN ED: Medications  - No data to display   IMPRESSION / MDM / [place] / ED COURSE   I have reviewed the triage note.  Differential diagnosis includes, but is not limited to, neuropathy, progressive neurological disease, thyroid disorder  26 year old male presenting to the emergency department for treatment and evaluation of symptoms as described in the HPI.  Plan will be to get some screening labs to rule out underlying conditions.  We will give a dose of prednisone to see if that helps with the radicular symptoms.  Patient to be moved to room 9 to await lab results.  Care relinquished to Dr. Leonides Schanz who will follow to disposition.      FINAL CLINICAL IMPRESSION(S) / ED DIAGNOSES   Final diagnoses:  Pain     Rx / DC Orders   ED Discharge Orders     None        Note:  This document was prepared using Dragon voice recognition software and may include unintentional dictation errors.   Victorino Dike, FNP  10/11/21 [place], Kevin, MD 10/15/21 [phone]

## 2021-10-08 NOTE — ED Triage Notes (Signed)
Pt reports he has been dealing with left foot pain and numbness to his toes. Reports today he was sitting playing cards and stood up and felt his left leg numb and his toes with shooting pains. Pt denies any injuries. Pt talks in complete sentences no distress noted.

## 2021-10-09 LAB — CBC WITH DIFFERENTIAL/PLATELET
Abs Immature Granulocytes: 0.02 10*3/uL (ref 0.00–0.07)
Basophils Absolute: 0 10*3/uL (ref 0.0–0.1)
Basophils Relative: 1 %
Eosinophils Absolute: 0.1 10*3/uL (ref 0.0–0.5)
Eosinophils Relative: 2 %
HCT: 42.7 % (ref 39.0–52.0)
Hemoglobin: 14.3 g/dL (ref 13.0–17.0)
Immature Granulocytes: 0 %
Lymphocytes Relative: 37 %
Lymphs Abs: 3.1 10*3/uL (ref 0.7–4.0)
MCH: 29.8 pg (ref 26.0–34.0)
MCHC: 33.5 g/dL (ref 30.0–36.0)
MCV: 89 fL (ref 80.0–100.0)
Monocytes Absolute: 0.8 10*3/uL (ref 0.1–1.0)
Monocytes Relative: 10 %
Neutro Abs: 4.3 10*3/uL (ref 1.7–7.7)
Neutrophils Relative %: 50 %
Platelets: 248 10*3/uL (ref 150–400)
RBC: 4.8 MIL/uL (ref 4.22–5.81)
RDW: 13.2 % (ref 11.5–15.5)
WBC: 8.5 10*3/uL (ref 4.0–10.5)
nRBC: 0 % (ref 0.0–0.2)

## 2021-10-09 LAB — COMPREHENSIVE METABOLIC PANEL
ALT: 25 U/L (ref 0–44)
AST: 21 U/L (ref 15–41)
Albumin: 4 g/dL (ref 3.5–5.0)
Alkaline Phosphatase: 70 U/L (ref 38–126)
Anion gap: 10 (ref 5–15)
BUN: 11 mg/dL (ref 6–20)
CO2: 25 mmol/L (ref 22–32)
Calcium: 8.9 mg/dL (ref 8.9–10.3)
Chloride: 103 mmol/L (ref 98–111)
Creatinine, Ser: 0.86 mg/dL (ref 0.61–1.24)
GFR, Estimated: >60 mL/min (ref >60)
Glucose, Bld: 101 mg/dL — ABNORMAL HIGH (ref 70–99)
Potassium: 4.1 mmol/L (ref 3.5–5.1)
Sodium: 138 mmol/L (ref 135–145)
Total Bilirubin: 0.4 mg/dL (ref 0.3–1.2)
Total Protein: 7.1 g/dL (ref 6.5–8.1)

## 2021-10-09 LAB — TSH: TSH: 2.631 u[IU]/mL (ref 0.350–4.500)

## 2021-10-09 NOTE — Discharge Instructions (Signed)
You were seen in the emergency department for intermittent numbness to the left foot, right foot and left hand.  We are very concerned about possible multiple sclerosis especially given your maternal aunt has multiple sclerosis.  We are also concerned about other emergent neurologic conditions such as Guillain-Barr and have recommended emergent neurologic evaluation and possible MRI, spinal tap and admission to the hospital however you have refused.  I recommend close follow-up with your primary care doctor.  Your blood work today was reassuring.  If it anytime you change your mind and would like further emergent work-up, please return to the emergency department immediately and we are happy to see you and take care of you.  If your symptoms worsen and you began having vision changes, worsening numbness or weakness, please return to the ER immediately.

## 2021-10-09 NOTE — ED Notes (Signed)
Pt states he cannot swallow pills and requesting medication in liquid form. Provider aware.

## 2021-10-09 NOTE — ED Notes (Addendum)
Patient came to desk to state that he wanted to leave AMA. Provider and I went to bedside and patient seemed very much not concerned that he needed to stay and speak with Neurology. Provider is concerned that patient may be a work-up for MS due to intermittent numbness and pain in his hands and feet. Patient doesn't believe that is what it is. Patient's mother at bedside states that her sister was dx with MS at 27 years old. Patient still states he wants to leave. He states he will see his PCP and schedule an MRI. Patient was provided AMA papers and he ambulated out to the waiting room with a steady gait.

## 2021-10-09 NOTE — ED Notes (Signed)
See triage note. Pt states he has been have pain in his left foot. Pt has been able to walk on foot and denies any injury.

## 2021-10-09 NOTE — ED Provider Notes (Signed)
12:42 AM  Assumed care at shift change.  Patient here with left-sided foot numbness and shooting pain up the leg with now right toe numbness and left hand numbness.  X-ray of the left foot unremarkable.  Labs show normal hemoglobin, electrolytes, glucose.  Thyroid function pending.  Patient has a maternal aunt with history of MS.  No recent vaccination or previous history of Guillain-Barr.  No recent fevers, infectious symptoms.  No signs of cellulitis, DVT on exam.  Strong DP pulses bilaterally.  Compartments soft.  Will consult neurology given waxing and waning neurologic symptoms that could be concerning for MS, Guillain-Barr.  On my exam, patient still has some numbness in the left toes and the sole of his foot but otherwise appears to be neurologically intact.  2:00 AM  Pt came to nurse stating that he did not want to stay to be seen by neurology and is refusing further work-up such as MRI, LP, admission if needed.  He states he is here because he wants someone to tell him why he has had pain in his left foot for a year.  Discussed with patient that he has no sign of fracture, arterial obstruction, claudication, DVT, compartment syndrome, gout, septic arthritis, cellulitis.  He keeps pointing to areas that look like old scarring to the top of his foot stating "look at this".  Have explained again that I see no sign of cellulitis.  I discussed with him and his mother that I am very concerned about these different areas of numbness that have been intermittent and in different areas of the body especially with a family history of multiple sclerosis and I feel he needs further emergent work-up for this.  He continues to refuse stating "I will just see my primary for this".  Have discussed my concern that if he does have multiple sclerosis and it is untreated or even Guillain-Barr that this can lead to worsening symptoms, permanent disability and even death.  His mother is at the bedside and has urged him  to stay for further work-up he again refuses.  We discussed that if at any point in time he changes his mind he should return to the emergency department immediately and especially if he begins having worsening neurologic symptoms.  Patient will sign out [place].  I reviewed all nursing notes and pertinent previous records as available.  I have reviewed and interpreted any and all EKGs, lab and urine results, imaging and radiology reports (as available).    Rhet Rorke, Delice Bison, DO 10/09/21 [phone]

## 2021-10-19 ENCOUNTER — Encounter: Payer: Self-pay | Admitting: Internal Medicine

## 2021-10-19 ENCOUNTER — Other Ambulatory Visit: Payer: Self-pay

## 2021-10-19 ENCOUNTER — Ambulatory Visit (INDEPENDENT_AMBULATORY_CARE_PROVIDER_SITE_OTHER): Payer: No Typology Code available for payment source | Admitting: Internal Medicine

## 2021-10-19 VITALS — BP 128/71 | HR 77 | Temp 97.7°F | Wt 281.0 lb

## 2021-10-19 DIAGNOSIS — J039 Acute tonsillitis, unspecified: Secondary | ICD-10-CM | POA: Diagnosis not present

## 2021-10-19 MED ORDER — METHYLPREDNISOLONE ACETATE 80 MG/ML IJ SUSP
80.0000 mg | Freq: Once | INTRAMUSCULAR | Status: AC
  Administered 2021-10-19: 80 mg via INTRAMUSCULAR

## 2021-10-19 NOTE — Progress Notes (Signed)
History of Present Illness:  Pt reports sore throat.  He report this started 5 days ago. He was seen in the ER yesterday for the same.  He came in complaining of headache, ringing in his ears, sore throat, nausea and fever.  Strep test was negative.  CBC revealed a WBC count of 12.  Troponin was negative.  CRP and ESR were slightly elevated.  C-Met was unremarkable.  He tested negative for flu, RSV or COVID.  Monotest was negative.  Chest x-ray did not reveal any acute findings.  ECG was unremarkable.  He was provided with Augmentin, discharged and advised to follow-up with his PCP.  Since discharge he reports all of his symptoms have resolved except for his sore throat.  He did not pick up the antibiotics and get started on them.  He reports he woke up this morning with facial swelling and inability to open his mouth fully.  He reports the symptoms have also gotten better.  He was treated for strep 07/2021 with antibiotics as well.    Past Medical History:  Diagnosis Date   Allergy    Depression    Diverticulitis    Dyspnea    Fainting spell    in middle school   Family history of bone cancer    Family history of breast cancer     No current outpatient medications on file.   No current facility-administered medications for this visit.    No Known Allergies  Family History  Problem Relation Age of Onset   Heart disease Maternal Grandmother    Diabetes Maternal Grandmother    Cancer Maternal Grandmother        breast cancer   Alport syndrome Maternal Grandmother    Cancer Paternal Grandfather        Bone cancer   Healthy Sister    Alport syndrome Maternal Aunt    Depression Mother    Cervical cancer Mother     Social History   Socioeconomic History   Marital status: Single    Spouse name: Not on file   Number of children: Not on file   Years of education: Not on file   Highest education level: Not on file  Occupational History   Not on file  Tobacco Use   Smoking  status: Former    Packs/day: 0.25    Types: Cigarettes   Smokeless tobacco: Never   Tobacco comments:    pt smoke pack a week  Vaping Use   Vaping Use: Some days   Last attempt to quit: 09/09/2019   Substances: Nicotine, Flavoring  Substance and Sexual Activity   Alcohol use: Yes    Alcohol/week: 2.0 standard drinks    Types: 2 Cans of beer per week    Comment: occassionally   Drug use: No   Sexual activity: Not on file  Other Topics Concern   Not on file  Social History Narrative   Not on file   Social Determinants of Health   Financial Resource Strain: Not on file  Food Insecurity: Not on file  Transportation Needs: Not on file  Physical Activity: Not on file  Stress: Not on file  Social Connections: Not on file  Intimate Partner Violence: Not on file     Constitutional: Pt reports headache, fever. Denies fever, malaise, fatigue, or abrupt weight changes.  HEENT: Pt reports sore throat. Denies eye pain, eye redness, ear pain, ringing in the ears, wax buildup, runny nose, nasal congestion, bloody nose. Respiratory: Denies  difficulty breathing, shortness of breath, cough or sputum production.   Cardiovascular: Denies chest pain, chest tightness, palpitations or swelling in the hands or feet.  Gastrointestinal: Patient reports nausea.  Denies abdominal pain, bloating, constipation, diarrhea or blood in the stool.  Skin: Denies redness, rashes, lesions or ulcercations.    No other specific complaints in a complete review of systems (except as listed in HPI above).    Observations/Objective:  BP 128/71 (BP Location: Right Arm, Patient Position: Sitting, Cuff Size: Large)    Pulse 77    Temp 97.7 F (36.5 C) (Temporal)    Wt 281 lb (127.5 kg)    SpO2 97%    BMI 38.11 kg/m   Wt Readings from Last 3 Encounters:  10/08/21 275 lb (124.7 kg)  08/19/21 275 lb (124.7 kg)  06/29/21 280 lb (127 kg)    General: Appears his stated age, obese, in NAD. HEENT: Head: normal  shape and size; Throat/Mouth: Buccal mucosa pink and moist, Uvula swollen but raises midline, tonsil 2+ bilaterally, erythematous with exudate noted bilaterally. Neck: No adenopathy noted but tender with palpation. Heart: Regular rate and rhythm, no murmur noted Pulmonary/Chest: Normal effort.  Positive vesicular breath sounds.  No respiratory distress.  No rhonchi, wheezing or rails noted. Neurological: Alert and oriented.  Psych: Anxious appearing.  BMET    Component Value Date/Time   NA 138 10/08/2021 2345   NA 138 12/16/2014 0839   K 4.1 10/08/2021 2345   K 3.9 12/16/2014 0839   CL 103 10/08/2021 2345   CL 106 12/16/2014 0839   CO2 25 10/08/2021 2345   CO2 26 12/16/2014 0839   GLUCOSE 101 (H) 10/08/2021 2345   GLUCOSE 110 (H) 12/16/2014 0839   BUN 11 10/08/2021 2345   BUN 11 12/16/2014 0839   CREATININE 0.86 10/08/2021 2345   CREATININE 0.81 12/16/2014 0839   CALCIUM 8.9 10/08/2021 2345   CALCIUM 8.8 (L) 12/16/2014 0839   GFRNONAA >60 10/08/2021 2345   GFRNONAA >60 12/16/2014 0839   GFRAA >60 12/09/2019 0555   GFRAA >60 12/16/2014 0839    Lipid Panel  No results found for: CHOL, TRIG, HDL, CHOLHDL, VLDL, LDLCALC  CBC    Component Value Date/Time   WBC 8.5 10/08/2021 2345   RBC 4.80 10/08/2021 2345   HGB 14.3 10/08/2021 2345   HGB 14.6 12/16/2014 0839   HCT 42.7 10/08/2021 2345   HCT 44.0 12/16/2014 0839   PLT 248 10/08/2021 2345   PLT 233 12/16/2014 0839   MCV 89.0 10/08/2021 2345   MCV 88 12/16/2014 0839   MCH 29.8 10/08/2021 2345   MCHC 33.5 10/08/2021 2345   RDW 13.2 10/08/2021 2345   RDW 13.8 12/16/2014 0839   LYMPHSABS 3.1 10/08/2021 2345   LYMPHSABS 2.9 12/16/2014 0839   MONOABS 0.8 10/08/2021 2345   MONOABS 1.2 (H) 12/16/2014 0839   EOSABS 0.1 10/08/2021 2345   EOSABS 0.3 12/16/2014 0839   BASOSABS 0.0 10/08/2021 2345   BASOSABS 0.1 12/16/2014 0839    Hgb A1C Lab Results  Component Value Date   HGBA1C 5.4 12/31/2019       Assessment  and Plan:  ER follow-up for Tonsillitis:  ER notes, labs and imaging reviewed Today's exam consistent with tonsillitis Advised him to pick up the antibiotics and start them tonight 80 mg Depo-Medrol IM today Advised him if he has persistent issues with sore throat, will refer to ENT for further evaluation  Return precautions discussed [name], NP This visit occurred during  the SARS-CoV-2 public health emergency.  Safety protocols were in place, including screening questions prior to the visit, additional usage of staff PPE, and extensive cleaning of exam room while observing appropriate contact time as indicated for disinfecting solutions.

## 2021-10-19 NOTE — Patient Instructions (Signed)
Tonsillitis Tonsillitis is an infection of the throat. Tonsils are tissues in the back of your throat. This infection causes the tonsils to become red, tender, and swollen. What are the causes? Tonsillitis is caused by germs (bacteria or a virus). This condition can also occur when pieces of food and bacteria build up around the tonsils. Tonsillitis that is caused by germs can spread from person to person. What are the signs or symptoms? A sore throat. Trouble swallowing. White patches on the tonsils. Swollen tonsils. Fever. Headache. Tiredness. Not feeling hungry. Snoring during sleep when you did not snore before. Foul-smelling, yellowish-white pieces of material that you cough up or spit out. These can cause bad breath. How is this treated? Medicines. These can be given to treat pain, swelling, or fever. They can also be given to kill bacteria. Surgery to take out the tonsils. This is done if you have very bad infections that do not go away. Follow these instructions at home: Medicines Take over-the-counter and prescription medicines only as told by your doctor. If you were prescribed an antibiotic medicine, take it as told by your doctor. Do not stop taking the antibiotic even if you start to feel better. Eating and drinking Drink enough fluid to keep your pee (urine) pale yellow. While your throat is sore, eat soft or liquid foods, such as: Soup. Sherbet. Soft, warm cereals, such as oatmeal or hot wheat cereal. Drink warm fluids. Eat frozen ice pops. General instructions Rest as much as you can, and get plenty of sleep. Rinse your mouth often with salt water. To make salt water, dissolve -1 tsp (3-6 g) of salt in 1 cup (237 mL) of warm water. Do not swallow the salt water. Wash your hands often with soap and water for at least 20 seconds. If there is no soap and water, use hand sanitizer. Do not share cups, bottles, or other utensils until your symptoms are gone. Do not  smoke or use any products that contain nicotine or tobacco. If you need help quitting, ask your doctor. Keep all follow-up visits. Contact a doctor if: You have large, tender lumps in your neck that are new. You have a fever that does not go away after 2-3 days. You have a rash. You cough up green, yellow-brown, or bloody fluid. You cannot swallow liquids or food for 24 hours. Only one of your tonsils is swollen. Get help right away if: You have any new symptoms such as: Vomiting. Very bad headache. Stiff neck. Chest pain. Trouble breathing or swallowing. You have very bad throat pain, and you also have drooling or voice changes. You have very bad pain that is not helped by medicine. You cannot fully open your mouth. You have redness, swelling, or very bad pain anywhere in your neck. Summary Tonsillitis is an infection of the throat. It causes your tonsils to be red, tender, and swollen. While your throat is sore, eat soft or liquid foods. Rinse your mouth often with salt water. Do not share cups, bottles, or other utensils until your symptoms are gone. This information is not intended to replace advice given to you by your health care provider. Make sure you discuss any questions you have with your health care provider. Document Revised: 01/07/2021 Document Reviewed: 01/07/2021 Elsevier Patient Education  [place].

## 2021-10-21 ENCOUNTER — Other Ambulatory Visit: Payer: Self-pay | Admitting: Internal Medicine

## 2021-10-21 NOTE — Telephone Encounter (Signed)
Pt called about status of refill for escitalopram (LEXAPRO) 5 MG/5ML solution/ to be sent to [facility] / please advise   Pt stated he was just in the office for an appt on 2.21.23

## 2021-10-21 NOTE — Telephone Encounter (Signed)
Requested medication (s) are due for refill today:   No  Requested medication (s) are on the active medication list:   No   Does not have any medications on his med list.  Future visit scheduled:   No   Seen by Rollene Fare 2 days ago for tonsillitis but Lexapro is not mentioned or on his med. List.   Last ordered: N/A  Returned because he is requesting Lexapro solution.   Provider to review.   Requested Prescriptions  Pending Prescriptions Disp Refills   escitalopram (LEXAPRO) 5 MG/5ML solution [Pharmacy Med Name: ESCITALOPRAM OXALATE 5 MG/5 ML] 600 mL 0    Sig: Take 20 mLs (20 mg total) by mouth daily.     Psychiatry:  Antidepressants - SSRI Passed - 10/21/2021 10:02 AM      Passed - Completed PHQ-2 or PHQ-9 in the last 360 days      Passed - Valid encounter within last 6 months    Recent Outpatient Visits           2 days ago [facility] [place], Coralie Keens, NP   2 months ago Strep pharyngitis   [facility] [place], [place], [place]   5 months ago Rash and nonspecific skin eruption   [facility] [place], [place], NP   6 months ago Class 2 severe obesity due to excess calories with serious comorbidity and body mass index (BMI) of 38.0 to 38.9 in adult [facility])   [facility], Coralie Keens, NP   1 year ago Viral infection   [facility], Lupita Raider, [place]

## 2021-10-21 NOTE — Telephone Encounter (Signed)
Requested medication (s) are due for refill today - no  Requested medication (s) are on the active medication list -no  Future visit scheduled -no  Last refill: 03/26/21- per medication history  Notes to clinic: Request RF: medication was discontinued from medication list- 05/11/21 as not taking- patient has requested RF- sent to office for review of request  Requested Prescriptions  Pending Prescriptions Disp Refills   escitalopram (LEXAPRO) 5 MG/5ML solution [Pharmacy Med Name: ESCITALOPRAM OXALATE 5 MG/5 ML] 600 mL 0    Sig: Take 20 mLs (20 mg total) by mouth daily.     Psychiatry:  Antidepressants - SSRI Passed - 10/21/2021 10:02 AM      Passed - Completed PHQ-2 or PHQ-9 in the last 360 days      Passed - Valid encounter within last 6 months    Recent Outpatient Visits           2 days ago [facility] [place], Coralie Keens, NP   2 months ago Strep pharyngitis   [facility] [place], [place], NP   5 months ago Rash and nonspecific skin eruption   [facility] [place], [place], NP   6 months ago Class 2 severe obesity due to excess calories with serious comorbidity and body mass index (BMI) of 38.0 to 38.9 in adult [facility])   [facility], Coralie Keens, NP   1 year ago Viral infection   [place], [place]                 Requested Prescriptions  Pending Prescriptions Disp Refills   escitalopram (LEXAPRO) 5 MG/5ML solution [Pharmacy Med Name: ESCITALOPRAM OXALATE 5 MG/5 ML] 600 mL 0    Sig: Take 20 mLs (20 mg total) by mouth daily.     Psychiatry:  Antidepressants - SSRI Passed - 10/21/2021 10:02 AM      Passed - Completed PHQ-2 or PHQ-9 in the last 360 days      Passed - Valid encounter within last 6 months    Recent Outpatient Visits           2 days ago [facility] [place], Coralie Keens, NP   2 months ago Strep pharyngitis   [facility] [place], [place], NP   5 months ago Rash and nonspecific skin eruption   [facility] [place], [place], NP   6 months ago Class 2 severe obesity due to excess calories with serious comorbidity and body mass index (BMI) of 38.0 to 38.9 in adult [facility])   [facility], Coralie Keens, NP   1 year ago Viral infection   [facility], Lupita Raider, [place]

## 2022-01-06 ENCOUNTER — Other Ambulatory Visit: Payer: Self-pay | Admitting: Internal Medicine

## 2022-01-06 NOTE — Telephone Encounter (Signed)
Requested Prescriptions  ?Pending Prescriptions Disp Refills  ?? escitalopram (LEXAPRO) 5 MG/5ML solution [Pharmacy Med Name: ESCITALOPRAM OXALATE 5 MG/5 ML] 600 mL 0  ?  Sig: Take 20 mLs (20 mg total) by mouth daily.  ?  ? Psychiatry:  Antidepressants - SSRI Passed - 01/06/2022  9:31 AM  ?  ?  Passed - Completed PHQ-2 or PHQ-9 in the last 360 days  ?  ?  Passed - Valid encounter within last 6 months  ?  Recent Outpatient Visits   ?      ? 2 months ago Tonsillitis  ? [place], NP  ? 4 months ago Strep pharyngitis  ? [facility] [place], [place], NP  ? 8 months ago Rash and nonspecific skin eruption  ? [facility] [place], [place], NP  ? 9 months ago Class 2 severe obesity due to excess calories with serious comorbidity and body mass index (BMI) of 38.0 to 38.9 in adult [facility])  ? [facility] [place], Coralie Keens, NP  ? 1 year ago Viral infection  ? [facility], Lupita Raider, FNP  ?  ?  ? ?  ?  ?  ? ? ?

## 2022-01-14 ENCOUNTER — Other Ambulatory Visit: Payer: Self-pay

## 2022-01-14 ENCOUNTER — Encounter: Payer: Self-pay | Admitting: Emergency Medicine

## 2022-01-14 ENCOUNTER — Emergency Department
Admission: EM | Admit: 2022-01-14 | Discharge: 2022-01-14 | Discharge status: Against Medical Advice | Payer: No Typology Code available for payment source | Attending: Emergency Medicine | Admitting: Emergency Medicine

## 2022-01-14 ENCOUNTER — Emergency Department: Payer: No Typology Code available for payment source

## 2022-01-14 DIAGNOSIS — Z20822 Contact with and (suspected) exposure to covid-19: Secondary | ICD-10-CM | POA: Diagnosis not present

## 2022-01-14 DIAGNOSIS — R079 Chest pain, unspecified: Secondary | ICD-10-CM | POA: Insufficient documentation

## 2022-01-14 DIAGNOSIS — J00 Acute nasopharyngitis [common cold]: Secondary | ICD-10-CM | POA: Insufficient documentation

## 2022-01-14 DIAGNOSIS — R61 Generalized hyperhidrosis: Secondary | ICD-10-CM | POA: Diagnosis not present

## 2022-01-14 DIAGNOSIS — Z5321 Procedure and treatment not carried out due to patient leaving prior to being seen by health care provider: Secondary | ICD-10-CM | POA: Diagnosis not present

## 2022-01-14 DIAGNOSIS — R42 Dizziness and giddiness: Secondary | ICD-10-CM | POA: Insufficient documentation

## 2022-01-14 LAB — CBC
HCT: 44.4 % (ref 39.0–52.0)
Hemoglobin: 14.7 g/dL (ref 13.0–17.0)
MCH: 29.2 pg (ref 26.0–34.0)
MCHC: 33.1 g/dL (ref 30.0–36.0)
MCV: 88.3 fL (ref 80.0–100.0)
Platelets: 240 10*3/uL (ref 150–400)
RBC: 5.03 MIL/uL (ref 4.22–5.81)
RDW: 12.9 % (ref 11.5–15.5)
WBC: 9.5 10*3/uL (ref 4.0–10.5)
nRBC: 0 % (ref 0.0–0.2)

## 2022-01-14 LAB — BASIC METABOLIC PANEL
Anion gap: 9 (ref 5–15)
BUN: 15 mg/dL (ref 6–20)
CO2: 24 mmol/L (ref 22–32)
Calcium: 8.8 mg/dL — ABNORMAL LOW (ref 8.9–10.3)
Chloride: 105 mmol/L (ref 98–111)
Creatinine, Ser: 0.87 mg/dL (ref 0.61–1.24)
GFR, Estimated: >60 mL/min (ref >60)
Glucose, Bld: 103 mg/dL — ABNORMAL HIGH (ref 70–99)
Potassium: 3.7 mmol/L (ref 3.5–5.1)
Sodium: 138 mmol/L (ref 135–145)

## 2022-01-14 LAB — TROPONIN I (HIGH SENSITIVITY): Troponin I (High Sensitivity): 4 ng/L (ref <18)

## 2022-01-14 LAB — RESP PANEL BY RT-PCR (FLU A&B, COVID) ARPGX2
Influenza A by PCR: NEGATIVE
Influenza B by PCR: NEGATIVE
SARS Coronavirus 2 by RT PCR: NEGATIVE

## 2022-01-14 NOTE — ED Triage Notes (Signed)
Pt presents via EMS with complaints of CP that started this AM with associated cold sweats and dizziness. Hx of panic attacks - takes lexapro. Denies SOB.

## 2022-01-14 NOTE — ED Notes (Signed)
Pt informed ER staff that he could not wait any longer and had to go to work in an hour.  NAD noted at this time.

## 2022-01-18 ENCOUNTER — Emergency Department
Admission: EM | Admit: 2022-01-18 | Discharge: 2022-01-18 | Disposition: A | Discharge status: Home / Self Care | Payer: No Typology Code available for payment source | Attending: Emergency Medicine | Admitting: Emergency Medicine

## 2022-01-18 ENCOUNTER — Emergency Department: Payer: No Typology Code available for payment source

## 2022-01-18 ENCOUNTER — Encounter: Payer: Self-pay | Admitting: Intensive Care

## 2022-01-18 ENCOUNTER — Other Ambulatory Visit: Payer: Self-pay

## 2022-01-18 DIAGNOSIS — R079 Chest pain, unspecified: Secondary | ICD-10-CM | POA: Insufficient documentation

## 2022-01-18 LAB — CBC
HCT: 46.3 % (ref 39.0–52.0)
Hemoglobin: 15.2 g/dL (ref 13.0–17.0)
MCH: 28.8 pg (ref 26.0–34.0)
MCHC: 32.8 g/dL (ref 30.0–36.0)
MCV: 87.9 fL (ref 80.0–100.0)
Platelets: 264 10*3/uL (ref 150–400)
RBC: 5.27 MIL/uL (ref 4.22–5.81)
RDW: 12.6 % (ref 11.5–15.5)
WBC: 11.2 10*3/uL — ABNORMAL HIGH (ref 4.0–10.5)
nRBC: 0 % (ref 0.0–0.2)

## 2022-01-18 LAB — BASIC METABOLIC PANEL
Anion gap: 9 (ref 5–15)
BUN: 13 mg/dL (ref 6–20)
CO2: 25 mmol/L (ref 22–32)
Calcium: 9.4 mg/dL (ref 8.9–10.3)
Chloride: 104 mmol/L (ref 98–111)
Creatinine, Ser: 1.01 mg/dL (ref 0.61–1.24)
GFR, Estimated: >60 mL/min (ref >60)
Glucose, Bld: 131 mg/dL — ABNORMAL HIGH (ref 70–99)
Potassium: 4.2 mmol/L (ref 3.5–5.1)
Sodium: 138 mmol/L (ref 135–145)

## 2022-01-18 LAB — TROPONIN I (HIGH SENSITIVITY)
Troponin I (High Sensitivity): 5 ng/L (ref <18)
Troponin I (High Sensitivity): 4 ng/L (ref <18)

## 2022-01-18 MED ORDER — HYDROXYZINE HCL 10 MG/5ML PO SYRP: 25.0000 mg | ORAL_SOLUTION | Freq: Three times a day (TID) | ORAL | 0 refills | Status: DC | PRN

## 2022-01-18 MED ORDER — HYDROXYZINE HCL 10 MG/5ML PO SYRP
25.0000 mg | ORAL_SOLUTION | Freq: Once | ORAL | Status: AC
Start: 2022-01-18 — End: 2022-01-18
  Administered 2022-01-18: 25 mg via ORAL
  Filled 2022-01-18: qty 12.5

## 2022-01-18 NOTE — Discharge Instructions (Signed)
Please seek medical attention for any high fevers, chest pain, shortness of breath, change in behavior, persistent vomiting, bloody stool or any other new or concerning symptoms.  

## 2022-01-18 NOTE — ED Provider Notes (Signed)
[facility] Provider Note    Event Date/Time   First MD Initiated Contact with Patient 01/18/22 1529     (approximate)   History   Chest Pain   HPI  Timothy [name] is a 26 y.o. male  who, per cardiology note dated 02/28/20 was seen for a presyncopy thought likely micturition, who presents to the emergency department today because of concern for episode of chest pain, near syncope. The patient states that he had a similar episode earlier this week. Came to the ER but left before evaluation. Today the patient was at the movie theater, had just urinated and when he got back to his seat started feeling bad. Started sweating. Tried to leave the theater but collapsed, although he did not lose consciousness. Felt a burning sensation to his chest that radiated to both arms.  Does feel some improvement by the time of my exam.       Physical Exam   Triage Vital Signs: ED Triage Vitals  Enc Vitals Group     BP 01/18/22 1403 140/75     Pulse Rate 01/18/22 1403 82     Resp 01/18/22 1403 16     Temp 01/18/22 1403 98.6 F (37 C)     Temp Source 01/18/22 1403 Oral     SpO2 01/18/22 1403 95 %     Weight 01/18/22 1407 275 lb (124.7 kg)     Height 01/18/22 1407 6' (1.829 m)     Head Circumference --      Peak Flow --      Pain Score 01/18/22 1406 9     Pain Loc --      Pain Edu? --      Excl. in [place]? --     Most recent vital signs: Vitals:   01/18/22 1403  BP: 140/75  Pulse: 82  Resp: 16  Temp: 98.6 F (37 C)  SpO2: 95%    General: Awake, alert, anxious. CV:  Good peripheral perfusion. Regular rate and rhythm. No m/r/g Resp:  Normal effort. Lungs clear. Abd:  No distention. Non tender.    ED Results / Procedures / Treatments   Labs (all labs ordered are listed, but only abnormal results are displayed) Labs Reviewed  BASIC METABOLIC PANEL - Abnormal; Notable for the following components:      Result Value   Glucose, Bld 131 (*)    All other  components within normal limits  CBC - Abnormal; Notable for the following components:   WBC 11.2 (*)    All other components within normal limits  TROPONIN I (HIGH SENSITIVITY)  TROPONIN I (HIGH SENSITIVITY)     EKG  I, Nance Pear, attending physician, personally viewed and interpreted this EKG  EKG Time: 1359 Rate: 83 Rhythm: normal sinus rhythm Axis: normal Intervals: qtc 411 QRS: narrow ST changes: no st elevation Impression: normal ekg  RADIOLOGY I independently interpreted and visualized the CXR. My interpretation: No pneumonia. No pneumothorax.  Radiology interpretation:    IMPRESSION:  No active cardiopulmonary disease.     PROCEDURES:  Critical Care performed: No  Procedures   MEDICATIONS ORDERED IN ED: Medications - No data to display   IMPRESSION / MDM / [place] / ED COURSE  I reviewed the triage vital signs and the nursing notes.  Differential diagnosis includes, but is not limited to, panic attack, pneumonia, ACS, GERD.  Patient presented to the emergency department today because of concerns for chest pain.  On exam patient did appear quite anxious.  Troponin was negative x2.  X-ray without any concern for pneumonia or pneumothorax.  Doubt PE or dissection.  Blood work without any concerning leukocytosis or electrolyte abnormalities.  Patient did feel better after Atarax.  I did discuss with patient possibility of anxiety playing a role in his symptoms.  Will give patient prescription for Atarax.  Additionally discussed possibility of acid reflux with patient. Given reassuring work up and clinical improvement do not feel patient necessitates inpatient admission at this time.   FINAL CLINICAL IMPRESSION(S) / ED DIAGNOSES   Final diagnoses:  Nonspecific chest pain     Note:  This document was prepared using Dragon voice recognition software and may include unintentional dictation errors.    Nance Pear, MD 01/18/22 Lurline Hare

## 2022-01-18 NOTE — ED Notes (Signed)
See triage note  presents with chest pain with some left arm numbness  describes as burning   became diaphoretic  but skin is w/d on arrival

## 2022-01-18 NOTE — ED Triage Notes (Signed)
Patient reports experiencing chest pain and burning sensation throughout center of chest and expanded out to shoulder blades/arms/fingers today. Reports cold sweat and dizziness/near syncope. A&O x4 in triage

## 2022-01-25 ENCOUNTER — Telehealth (INDEPENDENT_AMBULATORY_CARE_PROVIDER_SITE_OTHER): Payer: No Typology Code available for payment source | Admitting: Internal Medicine

## 2022-01-25 ENCOUNTER — Encounter: Payer: Self-pay | Admitting: Internal Medicine

## 2022-01-25 DIAGNOSIS — K219 Gastro-esophageal reflux disease without esophagitis: Secondary | ICD-10-CM

## 2022-01-25 DIAGNOSIS — F411 Generalized anxiety disorder: Secondary | ICD-10-CM | POA: Diagnosis not present

## 2022-01-25 DIAGNOSIS — R0789 Other chest pain: Secondary | ICD-10-CM | POA: Diagnosis not present

## 2022-01-25 MED ORDER — HYDROXYZINE HCL 10 MG/5ML PO SYRP: 25.0000 mg | ORAL_SOLUTION | Freq: Three times a day (TID) | ORAL | 0 refills | Status: DC | PRN

## 2022-01-25 MED ORDER — OMEPRAZOLE 2 MG/ML ORAL SUSPENSION: 20.0000 mg | Freq: Every day | ORAL | 2 refills | Status: DC

## 2022-01-25 NOTE — Progress Notes (Signed)
Virtual Visit via Video Note  I connected with Timothy [name] on 01/25/22 at 11:20 AM EDT by a video enabled telemedicine application and verified that I am speaking with the correct person using two identifiers.  Location: Patient: Home Provider: Office  Person's participating in this video call: [name], NP and Timothy Sevin.   I discussed the limitations of evaluation and management by telemedicine and the availability of in person appointments. The patient expressed understanding and agreed to proceed.  History of Present Illness:  Pt reports multiple ER visits. He went to the ER 5/19 for chest pain but he left without being seen. He went again 5/23 for the same complaint. Chest xray and labs were unremarkable. He was diagnosed with anxiety and given Hydroxyzine to take as needed. He was discharged and advised to follow up with his PCP. He reports he does have anxiety. He stopped his Lexapro and reports he feels better off of it, however his employer thinks that he needs to continue this in order to do his job. He feels like his main source of anxiety is work stress, and he has been seeking out other employment. He does not feel overly depressed. He is not currently seeing a therapist. He denies SI/HI.  He also complains of  heartburn. This has been an ongoing issue, but he feels like it has been worse in the last week. He describes it as a burning sensation that starts in his chest, radiates up into his throat and into his arms. He feels like this is being triggered by acidic foods so he has tried to cut this out. He reports the heartburn causing chest pain gives him anxiety. He has not tried anything OTC for this.     Past Medical History:  Diagnosis Date   Allergy    Depression    Diverticulitis    Dyspnea    Fainting spell    in middle school   Family history of bone cancer    Family history of breast cancer     Current Outpatient Medications  Medication Sig Dispense  Refill   escitalopram (LEXAPRO) 5 MG/5ML solution Take 20 mLs (20 mg total) by mouth daily. 600 mL 2   hydrOXYzine (ATARAX) 10 MG/5ML syrup Take 12.5 mLs (25 mg total) by mouth 3 (three) times daily as needed for up to 20 doses for anxiety. 240 mL 0   No current facility-administered medications for this visit.    No Known Allergies  Family History  Problem Relation Age of Onset   Heart disease Maternal Grandmother    Diabetes Maternal Grandmother    Cancer Maternal Grandmother        breast cancer   Alport syndrome Maternal Grandmother    Cancer Paternal Grandfather        Bone cancer   Healthy Sister    Alport syndrome Maternal Aunt    Depression Mother    Cervical cancer Mother     Social History   Socioeconomic History   Marital status: Single    Spouse name: Not on file   Number of children: Not on file   Years of education: Not on file   Highest education level: Not on file  Occupational History   Not on file  Tobacco Use   Smoking status: Former    Packs/day: 0.25    Types: Cigarettes   Smokeless tobacco: Never   Tobacco comments:    pt smoke pack a week  Vaping Use  Vaping Use: Every day   Last attempt to quit: 09/09/2019   Substances: Nicotine, Flavoring  Substance and Sexual Activity   Alcohol use: Yes    Alcohol/week: 6.0 standard drinks    Types: 6 Cans of beer per week    Comment: occassionally   Drug use: No   Sexual activity: Not on file  Other Topics Concern   Not on file  Social History Narrative   Not on file   Social Determinants of Health   Financial Resource Strain: Not on file  Food Insecurity: Not on file  Transportation Needs: Not on file  Physical Activity: Not on file  Stress: Not on file  Social Connections: Not on file  Intimate Partner Violence: Not on file     Constitutional: Denies fever, malaise, fatigue, headache or abrupt weight changes.  HEENT: Denies eye pain, eye redness, ear pain, ringing in the ears, wax  buildup, runny nose, nasal congestion, bloody nose, or sore throat. Respiratory: Denies difficulty breathing, shortness of breath, cough or sputum production.   Cardiovascular: Pt reports intermittent chest pain. Denies chest tightness, palpitations or swelling in the hands or feet.  Gastrointestinal: Pt reports reflux. Denies abdominal pain, bloating, constipation, diarrhea or blood in the stool.  Skin: Pt reports sweating with chest pain. Denies redness, rashes, lesions or ulcercations.  Neurological: Denies dizziness, difficulty with memory, difficulty with speech or problems with balance and coordination.  Psych: Pt reports anxiety. Denies depression, SI/HI.  No other specific complaints in a complete review of systems (except as listed in HPI above).   Observations/Objective:   Wt Readings from Last 3 Encounters:  01/18/22 275 lb (124.7 kg)  10/19/21 281 lb (127.5 kg)  10/08/21 275 lb (124.7 kg)    General: Appears his stated age, obese, in NAD. Pulmonary/Chest: Normal effort. No respiratory distress.  Neurological: Alert and oriented.  Psychiatric: He seems indecisive. He has a hard time explaining his thoughts. His thoughts are disorganized. He is mildly anxious appearing.   BMET    Component Value Date/Time   NA 138 01/18/2022 1407   NA 138 12/16/2014 0839   K 4.2 01/18/2022 1407   K 3.9 12/16/2014 0839   CL 104 01/18/2022 1407   CL 106 12/16/2014 0839   CO2 25 01/18/2022 1407   CO2 26 12/16/2014 0839   GLUCOSE 131 (H) 01/18/2022 1407   GLUCOSE 110 (H) 12/16/2014 0839   BUN 13 01/18/2022 1407   BUN 11 12/16/2014 0839   CREATININE 1.01 01/18/2022 1407   CREATININE 0.81 12/16/2014 0839   CALCIUM 9.4 01/18/2022 1407   CALCIUM 8.8 (L) 12/16/2014 0839   GFRNONAA >60 01/18/2022 1407   GFRNONAA >60 12/16/2014 0839   GFRAA >60 12/09/2019 0555   GFRAA >60 12/16/2014 0839    Lipid Panel  No results found for: CHOL, TRIG, HDL, CHOLHDL, VLDL, LDLCALC  CBC     Component Value Date/Time   WBC 11.2 (H) 01/18/2022 1407   RBC 5.27 01/18/2022 1407   HGB 15.2 01/18/2022 1407   HGB 14.6 12/16/2014 0839   HCT 46.3 01/18/2022 1407   HCT 44.0 12/16/2014 0839   PLT 264 01/18/2022 1407   PLT 233 12/16/2014 0839   MCV 87.9 01/18/2022 1407   MCV 88 12/16/2014 0839   MCH 28.8 01/18/2022 1407   MCHC 32.8 01/18/2022 1407   RDW 12.6 01/18/2022 1407   RDW 13.8 12/16/2014 0839   LYMPHSABS 3.1 10/08/2021 2345   LYMPHSABS 2.9 12/16/2014 0839   MONOABS 0.8 10/08/2021  2345   MONOABS 1.2 (H) 12/16/2014 0839   EOSABS 0.1 10/08/2021 2345   EOSABS 0.3 12/16/2014 0839   BASOSABS 0.0 10/08/2021 2345   BASOSABS 0.1 12/16/2014 0839    Hgb A1C Lab Results  Component Value Date   HGBA1C 5.4 12/31/2019        Assessment and Plan:  ER Follow Up for Chest Pain, GERD, Anxiety:  ER notes, labs and imaging reviewed He does not want to continue Lexapro, will d/c He would like to continue Hydroxyzine prn, refilled today He declines referral to psychiatry or a therapist at this time Discussed that he needed to find additional ways to manage his stress and anxiety RX for Omeprazole 20 mg daily for heartburn  RTC in 1 month for annual exam, followup Follow Up Instructions:    I discussed the assessment and treatment plan with the patient. The patient was provided an opportunity to ask questions and all were answered. The patient agreed with the plan and demonstrated an understanding of the instructions.   The patient was advised to call back or seek an in-person evaluation if the symptoms worsen or if the condition fails to improve as anticipated.  [name], NP

## 2022-01-25 NOTE — Patient Instructions (Signed)

## 2022-02-02 ENCOUNTER — Ambulatory Visit (INDEPENDENT_AMBULATORY_CARE_PROVIDER_SITE_OTHER): Payer: No Typology Code available for payment source | Admitting: Internal Medicine

## 2022-02-02 ENCOUNTER — Encounter: Payer: Self-pay | Admitting: Internal Medicine

## 2022-02-02 VITALS — BP 132/84 | HR 92 | Temp 97.3°F | Ht 72.0 in | Wt 281.0 lb

## 2022-02-02 DIAGNOSIS — Z1159 Encounter for screening for other viral diseases: Secondary | ICD-10-CM | POA: Diagnosis not present

## 2022-02-02 DIAGNOSIS — J029 Acute pharyngitis, unspecified: Secondary | ICD-10-CM | POA: Diagnosis not present

## 2022-02-02 DIAGNOSIS — Z0001 Encounter for general adult medical examination with abnormal findings: Secondary | ICD-10-CM

## 2022-02-02 DIAGNOSIS — Z1211 Encounter for screening for malignant neoplasm of colon: Secondary | ICD-10-CM

## 2022-02-02 DIAGNOSIS — Z6838 Body mass index (BMI) 38.0-38.9, adult: Secondary | ICD-10-CM

## 2022-02-02 DIAGNOSIS — D72829 Elevated white blood cell count, unspecified: Secondary | ICD-10-CM | POA: Insufficient documentation

## 2022-02-02 LAB — POCT RAPID STREP A (OFFICE): Rapid Strep A Screen: NEGATIVE

## 2022-02-02 NOTE — Progress Notes (Signed)
Subjective:    Patient ID: Timothy [name], male    DOB: 05/30/1996, 26 y.o.   MRN: [id number]  HPI  Patient presents to clinic today for his annual exam. He also reports sore throat. He reports this started 1 week ago. He is having some difficulty swallowing. He denies headache, runny nose, nasal congestion, ear pain, cough or SOB. He denies fever, chills or body aches. He has not tried anything OTC for this.  Flu: never Tetanus: 03/2013 COVID: never Dentist: as needed  Diet: He does eat meat. He rarely eats fruits and veggies. He does eat some fried foods. He drinks mostly water. Exercise: None  Review of Systems     Past Medical History:  Diagnosis Date   Allergy    Depression    Diverticulitis    Dyspnea    Fainting spell    in middle school   Family history of bone cancer    Family history of breast cancer     Current Outpatient Medications  Medication Sig Dispense Refill   hydrOXYzine (ATARAX) 10 MG/5ML syrup Take 12.5 mLs (25 mg total) by mouth 3 (three) times daily as needed for up to 20 doses for anxiety. 240 mL 0   omeprazole (FIRST-OMEPRAZOLE) 2 mg/mL SUSP oral suspension Take 10 mLs (20 mg total) by mouth daily. 300 mL 2   No current facility-administered medications for this visit.    No Known Allergies  Family History  Problem Relation Age of Onset   Heart disease Maternal Grandmother    Diabetes Maternal Grandmother    Cancer Maternal Grandmother        breast cancer   Alport syndrome Maternal Grandmother    Cancer Paternal Grandfather        Bone cancer   Healthy Sister    Alport syndrome Maternal Aunt    Depression Mother    Cervical cancer Mother     Social History   Socioeconomic History   Marital status: Single    Spouse name: Not on file   Number of children: Not on file   Years of education: Not on file   Highest education level: Not on file  Occupational History   Not on file  Tobacco Use   Smoking status: Former    Packs/day:  0.25    Types: Cigarettes   Smokeless tobacco: Never   Tobacco comments:    pt smoke pack a week  Vaping Use   Vaping Use: Every day   Last attempt to quit: 09/09/2019   Substances: Nicotine, Flavoring  Substance and Sexual Activity   Alcohol use: Yes    Alcohol/week: 6.0 standard drinks    Types: 6 Cans of beer per week    Comment: occassionally   Drug use: No   Sexual activity: Not on file  Other Topics Concern   Not on file  Social History Narrative   Not on file   Social Determinants of Health   Financial Resource Strain: Not on file  Food Insecurity: Not on file  Transportation Needs: Not on file  Physical Activity: Not on file  Stress: Not on file  Social Connections: Not on file  Intimate Partner Violence: Not on file     Constitutional: Denies fever, malaise, fatigue, headache or abrupt weight changes.  HEENT: Pt reports sore throat. Denies eye pain, eye redness, ear pain, ringing in the ears, wax buildup, runny nose, nasal congestion, bloody nose, or sore throat. Respiratory: Denies difficulty breathing, shortness of breath, cough  or sputum production.   Cardiovascular: Patient reports intermittent chest pain.  Denies chest tightness, palpitations or swelling in the hands or feet.  Gastrointestinal: Patient reports intermittent reflux, abdominal pain.  Denies bloating, constipation, diarrhea or blood in the stool.  GU: Pt reports urinary frequency. Denies urgency, pain with urination, burning sensation, blood in urine, odor or discharge. Musculoskeletal: Denies decrease in range of motion, difficulty with gait, muscle pain or joint pain and swelling.  Skin: Denies redness, rashes, lesions or ulcercations.  Neurological: Denies dizziness, difficulty with memory, difficulty with speech or problems with balance and coordination.  Psych: Patient has a history of anxiety and depression.  Denies SI/HI.  No other specific complaints in a complete review of systems (except  as listed in HPI above).  Objective:   Physical Exam BP 132/84 (BP Location: Left Arm, Patient Position: Sitting, Cuff Size: Large)   Pulse 92   Temp (!) 97.3 F (36.3 C) (Temporal)   Ht 6' (1.829 m)   Wt 281 lb (127.5 kg)   SpO2 97%   BMI 38.11 kg/m   Wt Readings from Last 3 Encounters:  01/18/22 275 lb (124.7 kg)  10/19/21 281 lb (127.5 kg)  10/08/21 275 lb (124.7 kg)    General: Appears his stated age, obese, in NAD. Skin: Warm, dry and intact.  HEENT: Head: normal shape and size; Eyes: sclera white, no icterus, conjunctiva pink, PERRLA and EOMs intact;  Throat/Mouth: Teeth present, mucosa erythematous and moist, with white exudate noted on bilateral tonsillar pillars noted.  Neck:  Neck supple, trachea midline. No masses, lumps or thyromegaly present.  Cardiovascular: Normal rate and rhythm. S1,S2 noted.  No murmur, rubs or gallops noted. No JVD or BLE edema.  Pulmonary/Chest: Normal effort and positive vesicular breath sounds. No respiratory distress. No wheezes, rales or ronchi noted.  Abdomen: Soft and nontender. Normal bowel sounds.  Musculoskeletal: Strength 5/5 BUE/BLE.  No difficulty with gait.  Neurological: Alert and oriented. Cranial nerves II-XII grossly intact. Coordination normal.  Psychiatric: Disorganized thoughts.  Anxious appearing.   BMET    Component Value Date/Time   NA 138 01/18/2022 1407   NA 138 12/16/2014 0839   K 4.2 01/18/2022 1407   K 3.9 12/16/2014 0839   CL 104 01/18/2022 1407   CL 106 12/16/2014 0839   CO2 25 01/18/2022 1407   CO2 26 12/16/2014 0839   GLUCOSE 131 (H) 01/18/2022 1407   GLUCOSE 110 (H) 12/16/2014 0839   BUN 13 01/18/2022 1407   BUN 11 12/16/2014 0839   CREATININE 1.01 01/18/2022 1407   CREATININE 0.81 12/16/2014 0839   CALCIUM 9.4 01/18/2022 1407   CALCIUM 8.8 (L) 12/16/2014 0839   GFRNONAA >60 01/18/2022 1407   GFRNONAA >60 12/16/2014 0839   GFRAA >60 12/09/2019 0555   GFRAA >60 12/16/2014 0839    Lipid Panel   No results found for: CHOL, TRIG, HDL, CHOLHDL, VLDL, LDLCALC  CBC    Component Value Date/Time   WBC 11.2 (H) 01/18/2022 1407   RBC 5.27 01/18/2022 1407   HGB 15.2 01/18/2022 1407   HGB 14.6 12/16/2014 0839   HCT 46.3 01/18/2022 1407   HCT 44.0 12/16/2014 0839   PLT 264 01/18/2022 1407   PLT 233 12/16/2014 0839   MCV 87.9 01/18/2022 1407   MCV 88 12/16/2014 0839   MCH 28.8 01/18/2022 1407   MCHC 32.8 01/18/2022 1407   RDW 12.6 01/18/2022 1407   RDW 13.8 12/16/2014 0839   LYMPHSABS 3.1 10/08/2021 2345  LYMPHSABS 2.9 12/16/2014 0839   MONOABS 0.8 10/08/2021 2345   MONOABS 1.2 (H) 12/16/2014 0839   EOSABS 0.1 10/08/2021 2345   EOSABS 0.3 12/16/2014 0839   BASOSABS 0.0 10/08/2021 2345   BASOSABS 0.1 12/16/2014 0839    Hgb A1C Lab Results  Component Value Date   HGBA1C 5.4 12/31/2019           Assessment & Plan:   Preventative Health Maintenance:  Encouraged him to get a flu shot in fall Tetanus UTD Encouraged him to get a COVID vaccine Encouraged him to consume a balanced diet and exercise regimen Advised him to see a dentist annually We will check CBC, c-Met, lipid, A1c and hep C today  Sore Throat:  Rapid strep negative Likely r/t untreated GERD Advised him to try to find a pharmacy that we will fill the omeprazole solution, if not get Prilosec OTC and try this daily for 2 weeks  RTC in 6 months, follow-up chronic conditions [name], NP

## 2022-02-02 NOTE — Patient Instructions (Signed)
Health Maintenance, Male Adopting a healthy lifestyle and getting preventive care are important in promoting health and wellness. Ask your health care provider about: The right schedule for you to have regular tests and exams. Things you can do on your own to prevent diseases and keep yourself healthy. What should I know about diet, weight, and exercise? Eat a healthy diet  Eat a diet that includes plenty of vegetables, fruits, low-fat dairy products, and lean protein. Do not eat a lot of foods that are high in solid fats, added sugars, or sodium. Maintain a healthy weight Body mass index (BMI) is a measurement that can be used to identify possible weight problems. It estimates body fat based on height and weight. Your health care provider can help determine your BMI and help you achieve or maintain a healthy weight. Get regular exercise Get regular exercise. This is one of the most important things you can do for your health. Most adults should: Exercise for at least 150 minutes each week. The exercise should increase your heart rate and make you sweat (moderate-intensity exercise). Do strengthening exercises at least twice a week. This is in addition to the moderate-intensity exercise. Spend less time sitting. Even light physical activity can be beneficial. Watch cholesterol and blood lipids Have your blood tested for lipids and cholesterol at 27 years of age, then have this test every 5 years. You may need to have your cholesterol levels checked more often if: Your lipid or cholesterol levels are high. You are older than 27 years of age. You are at high risk for heart disease. What should I know about cancer screening? Many types of cancers can be detected early and may often be prevented. Depending on your health history and family history, you may need to have cancer screening at various ages. This may include screening for: Colorectal cancer. Prostate cancer. Skin cancer. Lung  cancer. What should I know about heart disease, diabetes, and high blood pressure? Blood pressure and heart disease High blood pressure causes heart disease and increases the risk of stroke. This is more likely to develop in people who have high blood pressure readings or are overweight. Talk with your health care provider about your target blood pressure readings. Have your blood pressure checked: Every 3-5 years if you are 18-39 years of age. Every year if you are 40 years old or older. If you are between the ages of 65 and 75 and are a current or former smoker, ask your health care provider if you should have a one-time screening for abdominal aortic aneurysm (AAA). Diabetes Have regular diabetes screenings. This checks your fasting blood sugar level. Have the screening done: Once every three years after age 45 if you are at a normal weight and have a low risk for diabetes. More often and at a younger age if you are overweight or have a high risk for diabetes. What should I know about preventing infection? Hepatitis B If you have a higher risk for hepatitis B, you should be screened for this virus. Talk with your health care provider to find out if you are at risk for hepatitis B infection. Hepatitis C Blood testing is recommended for: Everyone born from 1945 through 1965. Anyone with known risk factors for hepatitis C. Sexually transmitted infections (STIs) You should be screened each year for STIs, including gonorrhea and chlamydia, if: You are sexually active and are younger than 27 years of age. You are older than 27 years of age and your   health care provider tells you that you are at risk for this type of infection. Your sexual activity has changed since you were last screened, and you are at increased risk for chlamydia or gonorrhea. Ask your health care provider if you are at risk. Ask your health care provider about whether you are at high risk for HIV. Your health care provider  may recommend a prescription medicine to help prevent HIV infection. If you choose to take medicine to prevent HIV, you should first get tested for HIV. You should then be tested every 3 months for as long as you are taking the medicine. Follow these instructions at home: Alcohol use Do not drink alcohol if your health care provider tells you not to drink. If you drink alcohol: Limit how much you have to 0-2 drinks a day. Know how much alcohol is in your drink. In the [place] one drink equals one 12 oz bottle of beer (355 mL), one 5 oz glass of wine (148 mL), or one 1 oz glass of hard liquor (44 mL). Lifestyle Do not use any products that contain nicotine or tobacco. These products include cigarettes, chewing tobacco, and vaping devices, such as e-cigarettes. If you need help quitting, ask your health care provider. Do not use street drugs. Do not share needles. Ask your health care provider for help if you need support or information about quitting drugs. General instructions Schedule regular health, dental, and eye exams. Stay current with your vaccines. Tell your health care provider if: You often feel depressed. You have ever been abused or do not feel safe at home. Summary Adopting a healthy lifestyle and getting preventive care are important in promoting health and wellness. Follow your health care provider's instructions about healthy diet, exercising, and getting tested or screened for diseases. Follow your health care provider's instructions on monitoring your cholesterol and blood pressure. This information is not intended to replace advice given to you by your health care provider. Make sure you discuss any questions you have with your health care provider. Document Revised: 01/04/2021 Document Reviewed: 01/04/2021 Elsevier Patient Education  2023 [employer]  

## 2022-02-02 NOTE — Assessment & Plan Note (Signed)
Encourage diet and exercise for weight loss 

## 2022-02-03 LAB — HEMOGLOBIN A1C
Hgb A1c MFr Bld: 5.6 % of total Hgb (ref <5.7)
Mean Plasma Glucose: 114 mg/dL
eAG (mmol/L): 6.3 mmol/L

## 2022-02-03 LAB — COMPREHENSIVE METABOLIC PANEL
AG Ratio: 1.6 (calc) (ref 1.0–2.5)
ALT: 29 U/L (ref 9–46)
AST: 21 U/L (ref 10–40)
Albumin: 4.6 g/dL (ref 3.6–5.1)
Alkaline phosphatase (APISO): 72 U/L (ref 36–130)
BUN: 11 mg/dL (ref 7–25)
CO2: 30 mmol/L (ref 20–32)
Calcium: 9.8 mg/dL (ref 8.6–10.3)
Chloride: 102 mmol/L (ref 98–110)
Creat: 1.05 mg/dL (ref 0.60–1.24)
Globulin: 2.8 g/dL (calc) (ref 1.9–3.7)
Glucose, Bld: 100 mg/dL (ref 65–139)
Potassium: 4.3 mmol/L (ref 3.5–5.3)
Sodium: 140 mmol/L (ref 135–146)
Total Bilirubin: 0.2 mg/dL (ref 0.2–1.2)
Total Protein: 7.4 g/dL (ref 6.1–8.1)

## 2022-02-03 LAB — CBC
HCT: 42.7 % (ref 38.5–50.0)
Hemoglobin: 14.6 g/dL (ref 13.2–17.1)
MCH: 29.9 pg (ref 27.0–33.0)
MCHC: 34.2 g/dL (ref 32.0–36.0)
MCV: 87.5 fL (ref 80.0–100.0)
MPV: 10 fL (ref 7.5–12.5)
Platelets: 261 10*3/uL (ref 140–400)
RBC: 4.88 10*6/uL (ref 4.20–5.80)
RDW: 13.2 % (ref 11.0–15.0)
WBC: 9 10*3/uL (ref 3.8–10.8)

## 2022-02-03 LAB — HEPATITIS C ANTIBODY
Hepatitis C Ab: NONREACTIVE
SIGNAL TO CUT-OFF: 0.06 (ref <1.00)

## 2022-02-03 LAB — LIPID PANEL
Cholesterol: 165 mg/dL (ref <200)
HDL: 31 mg/dL — ABNORMAL LOW (ref >=40)
LDL Cholesterol (Calc): 90 mg/dL (calc)
Non-HDL Cholesterol (Calc): 134 mg/dL (calc) — ABNORMAL HIGH (ref <130)
Total CHOL/HDL Ratio: 5.3 (calc) — ABNORMAL HIGH (ref <5.0)
Triglycerides: 330 mg/dL — ABNORMAL HIGH (ref <150)

## 2022-02-03 LAB — TSH: TSH: 1.05 mIU/L (ref 0.40–4.50)

## 2022-02-04 ENCOUNTER — Telehealth: Payer: Self-pay

## 2022-02-04 DIAGNOSIS — E781 Pure hyperglyceridemia: Secondary | ICD-10-CM

## 2022-02-04 MED ORDER — ATORVASTATIN CALCIUM 10 MG PO TABS: 10.0000 mg | ORAL_TABLET | Freq: Every day | ORAL | 2 refills | Status: DC

## 2022-02-04 NOTE — Telephone Encounter (Signed)
Advised pt the difference between cholesterol and Triglycerides.     Pt advised to try cholesterol medicine (I advised him it only comes in pill form) Please send to [facility].    Thanks,   -Mickel Baas

## 2022-02-04 NOTE — Telephone Encounter (Signed)
I have sent atorvastatin to pharmacy.  Please have him schedule lab only appointment in 3 months for repeat lipid panel.

## 2022-02-04 NOTE — Addendum Note (Signed)
Addended by: Jearld Fenton on: 02/04/2022 11:39 AM   Modules accepted: Orders

## 2022-02-04 NOTE — Telephone Encounter (Signed)
Copied from [place] [id number]. Topic: General - Inquiry >> Feb 04, 2022  9:31 AM Erskine Squibb wrote: Reason for CRM: Patient had recent labs and he just wanted to know the difference between cholesterol and triglycerides. Please assist patient further

## 2022-02-05 ENCOUNTER — Encounter: Payer: Self-pay | Admitting: Intensive Care

## 2022-02-05 ENCOUNTER — Emergency Department: Payer: No Typology Code available for payment source

## 2022-02-05 ENCOUNTER — Other Ambulatory Visit: Payer: Self-pay

## 2022-02-05 ENCOUNTER — Emergency Department
Admission: EM | Admit: 2022-02-05 | Discharge: 2022-02-05 | Disposition: A | Discharge status: Home / Self Care | Payer: No Typology Code available for payment source | Attending: Emergency Medicine | Admitting: Emergency Medicine

## 2022-02-05 DIAGNOSIS — J029 Acute pharyngitis, unspecified: Secondary | ICD-10-CM | POA: Diagnosis not present

## 2022-02-05 DIAGNOSIS — K2100 Gastro-esophageal reflux disease with esophagitis, without bleeding: Secondary | ICD-10-CM | POA: Insufficient documentation

## 2022-02-05 DIAGNOSIS — M79621 Pain in right upper arm: Secondary | ICD-10-CM | POA: Insufficient documentation

## 2022-02-05 DIAGNOSIS — R079 Chest pain, unspecified: Secondary | ICD-10-CM

## 2022-02-05 DIAGNOSIS — M79622 Pain in left upper arm: Secondary | ICD-10-CM | POA: Diagnosis not present

## 2022-02-05 LAB — BASIC METABOLIC PANEL
Anion gap: 8 (ref 5–15)
BUN: 13 mg/dL (ref 6–20)
CO2: 23 mmol/L (ref 22–32)
Calcium: 9.1 mg/dL (ref 8.9–10.3)
Chloride: 108 mmol/L (ref 98–111)
Creatinine, Ser: 0.89 mg/dL (ref 0.61–1.24)
GFR, Estimated: >60 mL/min (ref >60)
Glucose, Bld: 117 mg/dL — ABNORMAL HIGH (ref 70–99)
Potassium: 3.6 mmol/L (ref 3.5–5.1)
Sodium: 139 mmol/L (ref 135–145)

## 2022-02-05 LAB — CBC
HCT: 41.9 % (ref 39.0–52.0)
Hemoglobin: 14 g/dL (ref 13.0–17.0)
MCH: 29.2 pg (ref 26.0–34.0)
MCHC: 33.4 g/dL (ref 30.0–36.0)
MCV: 87.5 fL (ref 80.0–100.0)
Platelets: 226 10*3/uL (ref 150–400)
RBC: 4.79 MIL/uL (ref 4.22–5.81)
RDW: 12.5 % (ref 11.5–15.5)
WBC: 9.7 10*3/uL (ref 4.0–10.5)
nRBC: 0 % (ref 0.0–0.2)

## 2022-02-05 LAB — TROPONIN I (HIGH SENSITIVITY): Troponin I (High Sensitivity): 5 ng/L (ref <18)

## 2022-02-05 MED ORDER — FAMOTIDINE 40 MG PO TABS: 40.0000 mg | ORAL_TABLET | Freq: Every evening | ORAL | 0 refills | Status: DC

## 2022-02-05 MED ORDER — FAMOTIDINE 20 MG PO TABS
40.0000 mg | ORAL_TABLET | Freq: Once | ORAL | Status: AC
  Administered 2022-02-05: 40 mg via ORAL
  Filled 2022-02-05: qty 2

## 2022-02-05 MED ORDER — ONDANSETRON 4 MG PO TBDP
8.0000 mg | ORAL_TABLET | Freq: Once | ORAL | Status: AC
  Administered 2022-02-05: 8 mg via ORAL
  Filled 2022-02-05: qty 2

## 2022-02-05 MED ORDER — ONDANSETRON HCL 4 MG/2ML IJ SOLN: 4.0000 mg | Freq: Once | INTRAMUSCULAR | Status: DC

## 2022-02-05 NOTE — ED Provider Notes (Signed)
[facility] Provider Note   Event Date/Time   First MD Initiated Contact with Patient 02/05/22 1606     (approximate) History  Chest Pain  HPI Timothy [name] is a 26 y.o. male  Location: Central chest Duration: 2 months prior to arrival Timing: Intermittent but worse when laying flat Severity: 4/10 Quality: Burning pain Context: Patient states that he has been experiencing an intermittent episodes of burning central chest pain that radiates through to his back and bilateral arms Modifying factors: Patient states this pain is worse when he is laying flat or resting and improves when standing or walking Associated Symptoms: Pain in bilateral upper extremities, sore throat ROS: Patient currently denies any vision changes, tinnitus, difficulty speaking, facial droop, shortness of breath, abdominal pain, nausea/vomiting/diarrhea, dysuria, or weakness/numbness/paresthesias in any extremity   Physical Exam  Triage Vital Signs: ED Triage Vitals  Enc Vitals Group     BP 02/05/22 1605 129/81     Pulse Rate 02/05/22 1605 94     Resp 02/05/22 1605 15     Temp 02/05/22 1605 98.4 F (36.9 C)     Temp Source 02/05/22 1605 Oral     SpO2 02/05/22 1605 98 %     Weight 02/05/22 1603 281 lb (127.5 kg)     Height 02/05/22 1603 6' (1.829 m)     Head Circumference --      Peak Flow --      Pain Score 02/05/22 1602 4     Pain Loc --      Pain Edu? --      Excl. in [place]? --    Most recent vital signs: Vitals:   02/05/22 1605  BP: 129/81  Pulse: 94  Resp: 15  Temp: 98.4 F (36.9 C)  SpO2: 98%   General: Awake, oriented x4. CV:  Good peripheral perfusion.  Resp:  Normal effort.  Abd:  No distention.  Other:  Young adult Caucasian overweight male sitting in bed in no acute distress ED Results / Procedures / Treatments  Labs (all labs ordered are listed, but only abnormal results are displayed) Labs Reviewed  BASIC METABOLIC PANEL - Abnormal; Notable for the  following components:      Result Value   Glucose, Bld 117 (*)    All other components within normal limits  CBC  TROPONIN I (HIGH SENSITIVITY)  TROPONIN I (HIGH SENSITIVITY)   EKG ED ECG REPORT I, Naaman Plummer, the attending physician, personally viewed and interpreted this ECG. Date: 02/05/2022 EKG Time: 1603 Rate: 74 Rhythm: normal sinus rhythm QRS Axis: normal Intervals: normal ST/T Wave abnormalities: normal Narrative Interpretation: no evidence of acute ischemia RADIOLOGY ED MD interpretation: 2 view chest x-ray interpreted by me shows no evidence of acute abnormalities including no pneumonia, pneumothorax, or widened mediastinum -Agree with radiology assessment Official radiology report(s): DG Chest 2 View  Result Date: 02/05/2022 CLINICAL DATA:  Chest pain EXAM: CHEST - 2 VIEW COMPARISON:  01/18/2022 FINDINGS: The lungs are clear without focal pneumonia, edema, pneumothorax or pleural effusion. The cardiopericardial silhouette is within normal limits for size. Telemetry leads overlie the chest. IMPRESSION: No active cardiopulmonary disease. Electronically Signed   By: Misty Stanley M.D.   On: 02/05/2022 16:51   PROCEDURES: Critical Care performed: No .1-3 Lead EKG Interpretation  Performed by: Naaman Plummer, MD Authorized by: Naaman Plummer, MD     Interpretation: normal     ECG rate:  92   ECG rate assessment:  normal     Rhythm: sinus rhythm     Ectopy: none     Conduction: normal    MEDICATIONS ORDERED IN ED: Medications - No data to display IMPRESSION / MDM / [place] / ED COURSE  I reviewed the triage vital signs and the nursing notes.                             The patient is on the cardiac monitor to evaluate for evidence of arrhythmia and/or significant heart rate changes. Patient's presentation is most consistent with acute presentation with potential threat to life or bodily function. Workup: ECG, CXR, CBC, BMP,  Troponin Findings: ECG: No overt evidence of STEMI. No evidence of Brugadas sign, delta wave, epsilon wave, significantly prolonged QTc, or malignant arrhythmia HS Troponin: Negative x1 Other Labs unremarkable for emergent problems. CXR: Without PTX, PNA, or widened mediastinum Last Stress Test: Never Last Heart Catheterization: Never HEART Score: 1  Given History, Exam, and Workup I have low suspicion for ACS, Pneumothorax, Pneumonia, Pulmonary Embolus, Tamponade, Aortic Dissection or other emergent problem as a cause for this presentation.   Reassesment: Prior to discharge patients pain was controlled and they were well appearing.  Disposition:  Discharge. Strict return precautions discussed with patient with full understanding. Advised patient to follow up promptly with primary care provider    FINAL CLINICAL IMPRESSION(S) / ED DIAGNOSES   Final diagnoses:  Chest pain, unspecified type  Gastroesophageal reflux disease with esophagitis without hemorrhage   Rx / DC Orders   ED Discharge Orders          Ordered    famotidine (PEPCID) 40 MG tablet  Every evening        02/05/22 1737    Ambulatory referral to Cardiology       Comments: If you have not heard from the Cardiology office within the next 72 hours please call [phone].   02/05/22 1740           Note:  This document was prepared using Dragon voice recognition software and may include unintentional dictation errors.   Naaman Plummer, MD 02/05/22 [phone]

## 2022-02-05 NOTE — ED Notes (Signed)
Pt repeatedly stating "I'm scared something bad is going to happen, I don't want to be alone in here"  Pt denies being unsafe at home. States he takes hydroxyzine for anxiety but does not think it is working.

## 2022-02-05 NOTE — ED Notes (Signed)
Pt was heard vomiting.  RN to room pt vomiting water onto floor. Given emesis bag and towel for water.  EDP notified

## 2022-02-05 NOTE — ED Notes (Signed)
Patient states he does not want to speak to the DR at this time. Patient states he wants to go home and take a nap before work.

## 2022-02-05 NOTE — ED Triage Notes (Signed)
Patient c/o intermittent chest pain X1 month with sharp headache and back pain. Reports he feels like something "burst" in the middle of his chest. Reports he felt safer here but denies being scared of anyone. Denies hallucinations. Denies SI/HI. Hx panic attacks.

## 2022-02-05 NOTE — ED Notes (Signed)
Pt requesting to see Dr. Cheri Fowler for additional questions about mental health

## 2022-02-15 ENCOUNTER — Emergency Department: Payer: No Typology Code available for payment source

## 2022-02-15 ENCOUNTER — Other Ambulatory Visit: Payer: Self-pay

## 2022-02-15 ENCOUNTER — Encounter: Payer: Self-pay | Admitting: Emergency Medicine

## 2022-02-15 ENCOUNTER — Emergency Department
Admission: EM | Admit: 2022-02-15 | Discharge: 2022-02-15 | Disposition: A | Discharge status: Home / Self Care | Payer: No Typology Code available for payment source | Attending: Emergency Medicine | Admitting: Emergency Medicine

## 2022-02-15 DIAGNOSIS — R531 Weakness: Secondary | ICD-10-CM | POA: Insufficient documentation

## 2022-02-15 DIAGNOSIS — H538 Other visual disturbances: Secondary | ICD-10-CM | POA: Diagnosis not present

## 2022-02-15 DIAGNOSIS — R519 Headache, unspecified: Secondary | ICD-10-CM | POA: Insufficient documentation

## 2022-02-15 DIAGNOSIS — R42 Dizziness and giddiness: Secondary | ICD-10-CM | POA: Diagnosis present

## 2022-02-15 LAB — URINALYSIS, ROUTINE W REFLEX MICROSCOPIC
Bilirubin Urine: NEGATIVE
Glucose, UA: NEGATIVE mg/dL
Hgb urine dipstick: NEGATIVE
Ketones, ur: NEGATIVE mg/dL
Leukocytes,Ua: NEGATIVE
Nitrite: NEGATIVE
Protein, ur: NEGATIVE mg/dL
Specific Gravity, Urine: 1.013 (ref 1.005–1.030)
pH: 6 (ref 5.0–8.0)

## 2022-02-15 LAB — BASIC METABOLIC PANEL
Anion gap: 9 (ref 5–15)
BUN: 15 mg/dL (ref 6–20)
CO2: 25 mmol/L (ref 22–32)
Calcium: 9.4 mg/dL (ref 8.9–10.3)
Chloride: 106 mmol/L (ref 98–111)
Creatinine, Ser: 0.92 mg/dL (ref 0.61–1.24)
GFR, Estimated: >60 mL/min (ref >60)
Glucose, Bld: 113 mg/dL — ABNORMAL HIGH (ref 70–99)
Potassium: 3.9 mmol/L (ref 3.5–5.1)
Sodium: 140 mmol/L (ref 135–145)

## 2022-02-15 LAB — CBC
HCT: 43.1 % (ref 39.0–52.0)
Hemoglobin: 14.2 g/dL (ref 13.0–17.0)
MCH: 29 pg (ref 26.0–34.0)
MCHC: 32.9 g/dL (ref 30.0–36.0)
MCV: 88.1 fL (ref 80.0–100.0)
Platelets: 238 10*3/uL (ref 150–400)
RBC: 4.89 MIL/uL (ref 4.22–5.81)
RDW: 12.7 % (ref 11.5–15.5)
WBC: 8.6 10*3/uL (ref 4.0–10.5)
nRBC: 0 % (ref 0.0–0.2)

## 2022-02-15 MED ORDER — SODIUM CHLORIDE 0.9 % IV BOLUS
1000.0000 mL | Freq: Once | INTRAVENOUS | Status: AC
  Administered 2022-02-15: 1000 mL via INTRAVENOUS

## 2022-02-15 MED ORDER — KETOROLAC TROMETHAMINE 15 MG/ML IJ SOLN
15.0000 mg | Freq: Once | INTRAMUSCULAR | Status: AC
  Administered 2022-02-15: 15 mg via INTRAVENOUS
  Filled 2022-02-15: qty 1

## 2022-02-15 NOTE — Discharge Instructions (Signed)
Your CT scan and blood work are normal.  Please follow-up with the phone numbers above.  Please return to the emergency department for any new, worsening, or change in symptoms or other concerns.  Was pleasure caring for you today.

## 2022-02-15 NOTE — ED Triage Notes (Signed)
Pt here with weakness and dizziness for a month. Pt states he has been seen but states he is being told it is his anxiety. Pt states he almost passed out a few days ago. Pt states a severe headache last night on the right side of his head.

## 2022-02-15 NOTE — ED Provider Notes (Signed)
[facility] Provider Note    Event Date/Time   First MD Initiated Contact with Patient 02/15/22 1115     (approximate)   History   Weakness and Dizziness   HPI  Timothy [name] is a 26 y.o. male with a past medical history of GERD, severe obesity, anxiety, depression who presents today for evaluation of headaches.  Patient reports that he has had intermittent headaches ever since he was a teenager.  He reports that over the past week he feels like his headaches have been more frequent.  He reports that he feels a sharp stabbing pain in the back of his head that last for approximately 1 minute.  He reports that this occurred 3 times last night while he was working which caused him concern.  He reports that when he gets the sharp pain he feels slightly blurry vision.  He reports that he says "dizziness" when he means "blurry vision."  Denies vertigo or lightheadedness.  He has not had any nausea or vomiting.  The headaches do not awaken him from sleep.  He has had no problems walking.  He notices that the bright lights make his headache worse.  He denies paresthesias or weakness.  He has not taken anything for his symptoms.  He reports that these headaches are similar to the headaches he has had since he was a teenager.  He has never had any head imaging.  He denies any trauma.  No recent illnesses.  No neck pain or fever.    Patient Active Problem List   Diagnosis Date Noted   Leukocytosis 02/02/2022   Gastroesophageal reflux disease without esophagitis 01/25/2022   Class 2 severe obesity due to excess calories with serious comorbidity and body mass index (BMI) of 38.0 to 38.9 in adult [facility]) 03/29/2021   Anxiety and depression 07/28/2017          Physical Exam   Triage Vital Signs: ED Triage Vitals [02/15/22 1100]  Enc Vitals Group     BP (!) 128/91     Pulse Rate 100     Resp 18     Temp 99.1 F (37.3 C)     Temp Source Oral     SpO2 96 %     Weight  281 lb 1.4 oz (127.5 kg)     Height 6' (1.829 m)     Head Circumference      Peak Flow      Pain Score 4     Pain Loc      Pain Edu?      Excl. in [place]?     Most recent vital signs: Vitals:   02/15/22 1100 02/15/22 1244  BP: (!) 128/91 130/88  Pulse: 100 90  Resp: 18 18  Temp: 99.1 F (37.3 C)   SpO2: 96% 97%    Physical Exam Vitals and nursing note reviewed.  Constitutional:      General: Awake and alert. No acute distress.    Appearance: Normal appearance. He is well-developed and overweight.  HENT:     Head: Normocephalic and atraumatic.  No temporal artery tenderness    Mouth: Mucous membranes are moist.  Eyes:     General: PERRL. Normal EOMs        Right eye: No discharge.        Left eye: No discharge.     Conjunctiva/sclera: Conjunctivae normal.  Cardiovascular:     Rate and Rhythm: Normal rate and regular rhythm.  Pulses: Normal pulses.     Heart sounds: Normal heart sounds Pulmonary:     Effort: Pulmonary effort is normal. No respiratory distress.     Breath sounds: Normal breath sounds.  Abdominal:     Abdomen is soft. There is no abdominal tenderness. No rebound or guarding. No distention. Well healed surgical scar Musculoskeletal:        General: No swelling. Normal range of motion.     Cervical back: Normal range of motion and neck supple.  Lymphadenopathy:     Cervical: No cervical adenopathy.  Skin:    General: Skin is warm and dry.     Capillary Refill: Capillary refill takes less than 2 seconds.     Findings: No rash.  Neurological:     Mental Status: He is alert.   Neurological: GCS 15 alert and oriented x3 Normal speech, no expressive or receptive aphasia or dysarthria Cranial nerves II through XII intact Normal visual fields 5 out of 5 strength in all 4 extremities with intact sensation throughout No extremity drift Normal finger-to-nose testing, no limb or truncal ataxia Psych:  Denies SI/HI.  Calm, cooperative, linear, goal  oriented.  Does not appear to be responding to internal stimuli.  Well-kempt.  Normal affect, normal eye contact   ED Results / Procedures / Treatments   Labs (all labs ordered are listed, but only abnormal results are displayed) Labs Reviewed  BASIC METABOLIC PANEL - Abnormal; Notable for the following components:      Result Value   Glucose, Bld 113 (*)    All other components within normal limits  URINALYSIS, ROUTINE W REFLEX MICROSCOPIC - Abnormal; Notable for the following components:   Color, Urine YELLOW (*)    APPearance CLEAR (*)    All other components within normal limits  CBC  CBG MONITORING, ED     EKG     RADIOLOGY I independently reviewed and interpreted imaging and agree with radiologists findings.     PROCEDURES:  Critical Care performed:   Procedures   MEDICATIONS ORDERED IN ED: Medications  ketorolac (TORADOL) 15 MG/ML injection 15 mg (15 mg Intravenous Given 02/15/22 1154)  sodium chloride 0.9 % bolus 1,000 mL (0 mLs Intravenous Stopped 02/15/22 1244)     IMPRESSION / MDM / ASSESSMENT AND PLAN / ED COURSE  I reviewed the triage vital signs and the nursing notes.   Patient has been seen multiple times within the past month in the emergency department.  He was most recently seen on 01/31/2022 at [facility] emergency department was diagnosed with migraine headache.  He was treated with IV fluids and migraine cocktail with improvement of the symptoms.  He had normal blood tests at that time.  He was also seen on 02/05/2022.  Patient presented with a chief concern of a headache. Gradual in onset, without history or physical exam findings to suggest encephalopathy; no altered mental status, fever or meningismus, vision changes, vomiting or focal neurological deficit and improved with treatment in the emergency department.  Doubt meningitis as there is no fever, photophobia, neck symptoms, altered mental status. Additionally the patient is not known to be  immunocompromised. No history of trauma, doubt subdural or epidural hematoma. No dizziness or other neurologic symptoms so cerebellar infarction or other hemorrhagic stroke are unlikely. Intracranial mass unlikely given that the headache is not getting progressively worse, is not worse in the morning, there are no other neurologic symptoms, and the neurologic exam is grossly normal. Unlikely to be giant cell  arteritis as there is no tenderness over temporal artery or vision changes. Doubt CO toxicity as no known exposure and no other family members have a headache. No neck pain and was not sudden onset or associated with movement of the neck and no dizziness,  doubt carotid artery dissection. No occipital tenderness so occipital neuralgia seems less likely.  Patient has never had neuroimaging before, therefore CT head obtained and this is negative for any acute pathology.  Labs are also overall reassuring.  He was treated with a headache cocktail with improvement of his symptoms.  Given his reoccurring headaches, I recommended that he follow-up with neurology.  He also asked for behavioral health follow-up given his anxiety and depression.  He denies SI or HI. Appears well kempt. The appropriate information was provided.  We discussed return precautions and the importance of close outpatient follow-up.  Patient understands and agrees with plan.  Discharged in stable condition.   Patient's presentation is most consistent with acute complicated illness / injury requiring diagnostic workup.   Clinical Course as of 02/15/22 1348  Tue Feb 15, 2022  1214 Patient reports that he feels improved.  [JP]    Clinical Course User Index [JP] Brady Plant, Clarnce Flock, PA-C     FINAL CLINICAL IMPRESSION(S) / ED DIAGNOSES   Final diagnoses:  Nonintractable episodic headache, unspecified headache type     Rx / DC Orders   ED Discharge Orders     None        Note:  This document was prepared using Dragon voice  recognition software and may include unintentional dictation errors.   Emeline Gins 02/15/22 1348    Blake Divine, MD 02/16/22 [phone]

## 2022-02-15 NOTE — ED Notes (Signed)
See triage note  presents with headache and some dizziness  states he has had the dizziness for the past month  but also had a near syncopal episode  low grade fever on arrival

## 2022-02-17 ENCOUNTER — Ambulatory Visit (INDEPENDENT_AMBULATORY_CARE_PROVIDER_SITE_OTHER): Payer: No Typology Code available for payment source | Admitting: Internal Medicine

## 2022-02-17 ENCOUNTER — Encounter: Payer: Self-pay | Admitting: Internal Medicine

## 2022-02-17 VITALS — BP 122/80 | HR 80 | Temp 97.1°F | Wt 275.0 lb

## 2022-02-17 DIAGNOSIS — K219 Gastro-esophageal reflux disease without esophagitis: Secondary | ICD-10-CM

## 2022-02-17 DIAGNOSIS — R0789 Other chest pain: Secondary | ICD-10-CM | POA: Diagnosis not present

## 2022-02-17 DIAGNOSIS — R519 Headache, unspecified: Secondary | ICD-10-CM

## 2022-02-17 DIAGNOSIS — F32A Depression, unspecified: Secondary | ICD-10-CM

## 2022-02-17 DIAGNOSIS — F419 Anxiety disorder, unspecified: Secondary | ICD-10-CM

## 2022-02-17 MED ORDER — SUMATRIPTAN 20 MG/ACT NA SOLN: 20.0000 mg | Freq: Once | NASAL | 0 refills | Status: DC | PRN

## 2022-02-17 NOTE — Progress Notes (Signed)
Subjective:    Patient ID: Timothy [name], male    DOB: 09-Jul-1996, 26 y.o.   MRN: [id number]  HPI  Pt presents to the clinic today for multiple ER followup. He presented to the ER 6/10 with c/o chest pain. Chest xray was normal. ECG was normal. Labs were unremarkable. He was discharged, given a RX for Famotidine and advised to follow up with cardiology. He presented back to the ER 6/15 with c/o headache and chest pain. ECG was again normal. He was given a migraine cocktail, discharged and advised to follow up with his PCP. He presented to the ER 6/20 with c/o headaches. CT head was negative. He was also given IV fluids. He was given Toradol, discharged and advised to followup with neurology. Since that time, he feels like his heartburn has improved. He attributes this to recent exercise and weight loss. He reports intermittent headaches. The headaches are located in the back of his head, in the temple and behind his eye. He describes the pain as sharp and stabbing. He does have sensitivity to light but denies sensitivity to sound. He has had some associated nausea and vomiting. He takes Tylenol as needed with minimal relief of symptoms. He reports his chest pain subsided but reoccurred last night. He reports this improves with distraction and deep breathing. He does report the chest pain seems to be triggered by GERD. He is able to use TUMS with good relief of symptoms. He reports he has persistent anxiety and depression. He takes Hydroxyzine as needed. He has had some suicidal thoughts but he has not active plan or intent. He denies HI.  Review of Systems     Past Medical History:  Diagnosis Date   Allergy    Depression    Diverticulitis    Dyspnea    Fainting spell    in middle school   Family history of bone cancer    Family history of breast cancer     Current Outpatient Medications  Medication Sig Dispense Refill   atorvastatin (LIPITOR) 10 MG tablet Take 1 tablet (10 mg total) by  mouth daily. 30 tablet 2   famotidine (PEPCID) 40 MG tablet Take 1 tablet (40 mg total) by mouth every evening. 30 tablet 0   hydrOXYzine (ATARAX) 10 MG/5ML syrup Take 12.5 mLs (25 mg total) by mouth 3 (three) times daily as needed for up to 20 doses for anxiety. 240 mL 0   omeprazole (FIRST-OMEPRAZOLE) 2 mg/mL SUSP oral suspension Take 10 mLs (20 mg total) by mouth daily. (Patient not taking: Reported on 02/02/2022) 300 mL 2   No current facility-administered medications for this visit.    No Known Allergies  Family History  Problem Relation Age of Onset   Heart disease Maternal Grandmother    Diabetes Maternal Grandmother    Cancer Maternal Grandmother        breast cancer   Alport syndrome Maternal Grandmother    Cancer Paternal Grandfather        Bone cancer   Healthy Sister    Alport syndrome Maternal Aunt    Depression Mother    Cervical cancer Mother     Social History   Socioeconomic History   Marital status: Single    Spouse name: Not on file   Number of children: Not on file   Years of education: Not on file   Highest education level: Not on file  Occupational History   Not on file  Tobacco Use  Smoking status: Former    Packs/day: 0.25    Types: Cigarettes   Smokeless tobacco: Current    Types: Snuff   Tobacco comments:    pt smoke pack a week  Vaping Use   Vaping Use: Every day   Last attempt to quit: 09/09/2019   Substances: Nicotine, Flavoring  Substance and Sexual Activity   Alcohol use: Yes    Alcohol/week: 6.0 standard drinks of alcohol    Types: 6 Cans of beer per week    Comment: occassionally   Drug use: No   Sexual activity: Not on file  Other Topics Concern   Not on file  Social History Narrative   Not on file   Social Determinants of Health   Financial Resource Strain: Not on file  Food Insecurity: Not on file  Transportation Needs: Not on file  Physical Activity: Not on file  Stress: Not on file  Social Connections: Not on file   Intimate Partner Violence: Not on file     Constitutional: Pt reports frequent headaches. Denies fever, malaise, fatigue, or abrupt weight changes.  HEENT: Denies eye pain, eye redness, ear pain, ringing in the ears, wax buildup, runny nose, nasal congestion, bloody nose, or sore throat. Respiratory: Denies difficulty breathing, shortness of breath, cough or sputum production.   Cardiovascular: Pt reports intermittent chest pain. Denies chest tightness, palpitations or swelling in the hands or feet.  Gastrointestinal: Pt reports reflux, nausea. Denies abdominal pain, bloating, constipation, diarrhea or blood in the stool.  GU: Denies urgency, frequency, pain with urination, burning sensation, blood in urine, odor or discharge. Musculoskeletal: Denies decrease in range of motion, difficulty with gait, muscle pain or joint pain and swelling.  Skin: Denies redness, rashes, lesions or ulcercations.  Neurological: Denies dizziness, difficulty with memory, difficulty with speech or problems with balance and coordination.  Psych: Pt has a history of anxiety and depression. Denies HI.  No other specific complaints in a complete review of systems (except as listed in HPI above).  Objective:   Physical Exam  BP 122/80 (BP Location: Right Arm, Patient Position: Sitting, Cuff Size: Large)   Pulse 80   Temp (!) 97.1 F (36.2 C) (Temporal)   Wt 275 lb (124.7 kg)   SpO2 99%   BMI 37.30 kg/m   Wt Readings from Last 3 Encounters:  02/15/22 281 lb 1.4 oz (127.5 kg)  02/05/22 281 lb (127.5 kg)  02/02/22 281 lb (127.5 kg)    General: Appears his stated age, obese, in NAD. Skin: Warm, dry and intact. HEENT: Head: normal shape and size; Eyes: sclera white, no icterus, conjunctiva pink, PERRLA and EOMs intact; Cardiovascular: Normal rate and rhythm. S1,S2 noted.  No murmur, rubs or gallops noted.  Pulmonary/Chest: Normal effort and positive vesicular breath sounds. No respiratory distress. No  wheezes, rales or ronchi noted.  Abdomen: Soft and nontender. Normal bowel sounds. No distention or masses noted.  Musculoskeletal: No difficulty with gait.  Neurological: Alert and oriented. Coordination normal.  Psychiatric: Mood and affect normal. Anxious appearing. Judgment and thought content normal.   BMET    Component Value Date/Time   NA 140 02/15/2022 1102   NA 138 12/16/2014 0839   K 3.9 02/15/2022 1102   K 3.9 12/16/2014 0839   CL 106 02/15/2022 1102   CL 106 12/16/2014 0839   CO2 25 02/15/2022 1102   CO2 26 12/16/2014 0839   GLUCOSE 113 (H) 02/15/2022 1102   GLUCOSE 110 (H) 12/16/2014 0839   BUN  15 02/15/2022 1102   BUN 11 12/16/2014 0839   CREATININE 0.92 02/15/2022 1102   CREATININE 1.05 02/02/2022 1022   CALCIUM 9.4 02/15/2022 1102   CALCIUM 8.8 (L) 12/16/2014 0839   GFRNONAA >60 02/15/2022 1102   GFRNONAA >60 12/16/2014 0839   GFRAA >60 12/09/2019 0555   GFRAA >60 12/16/2014 0839    Lipid Panel     Component Value Date/Time   CHOL 165 02/02/2022 1022   TRIG 330 (H) 02/02/2022 1022   HDL 31 (L) 02/02/2022 1022   CHOLHDL 5.3 (H) 02/02/2022 1022   LDLCALC 90 02/02/2022 1022    CBC    Component Value Date/Time   WBC 8.6 02/15/2022 1102   RBC 4.89 02/15/2022 1102   HGB 14.2 02/15/2022 1102   HGB 14.6 12/16/2014 0839   HCT 43.1 02/15/2022 1102   HCT 44.0 12/16/2014 0839   PLT 238 02/15/2022 1102   PLT 233 12/16/2014 0839   MCV 88.1 02/15/2022 1102   MCV 88 12/16/2014 0839   MCH 29.0 02/15/2022 1102   MCHC 32.9 02/15/2022 1102   RDW 12.7 02/15/2022 1102   RDW 13.8 12/16/2014 0839   LYMPHSABS 3.1 10/08/2021 2345   LYMPHSABS 2.9 12/16/2014 0839   MONOABS 0.8 10/08/2021 2345   MONOABS 1.2 (H) 12/16/2014 0839   EOSABS 0.1 10/08/2021 2345   EOSABS 0.3 12/16/2014 0839   BASOSABS 0.0 10/08/2021 2345   BASOSABS 0.1 12/16/2014 0839    Hgb A1C Lab Results  Component Value Date   HGBA1C 5.6 02/02/2022          Assessment & Plan:   ER  Follow Up for Headaches, Chest Pain, Reflux and Anxiety:  ER notes, labs and imaging reviewed. RX for Imitrex nasal spray Referral to psychiatry for further evaluation and treatment- as this is likely the rood cause of most of his problems Discussed trying Omeprazole caps- open up and sprinkle in applescause- he prefers to wait until his appt with GI for this Discussed ER overuse, advised him to call here for an appt  Return precautions discussed [name], NP

## 2022-02-17 NOTE — Patient Instructions (Signed)
General Headache Without Cause A headache is pain or discomfort you feel around the head or neck area. There are many causes and types of headaches. In some cases, the cause may not be found. Follow these instructions at home: Watch your condition for any changes. Let your doctor know about them. Take these steps to help with your condition: Managing pain     Take over-the-counter and prescription medicines only as told by your doctor. This includes medicines for pain that are taken by mouth or put on the skin. Lie down in a dark, quiet room when you have a headache. If told, put ice on your head and neck area: Put ice in a plastic bag. Place a towel between your skin and the bag. Leave the ice on for 20 minutes, 2-3 times per day. Take off the ice if your skin turns bright red. This is very important. If you cannot feel pain, heat, or cold, you have a greater risk of damage to the area. If told, put heat on the affected area. Use the heat source that your doctor recommends, such as a moist heat pack or a heating pad. Place a towel between your skin and the heat source. Leave the heat on for 20-30 minutes. Take off the heat if your skin turns bright red. This is very important. If you cannot feel pain, heat, or cold, you have a greater risk of getting burned. Keep lights dim if bright lights bother you or make your headaches worse. Eating and drinking Eat meals on a regular schedule. If you drink alcohol: Limit how much you have to: 0-1 drink a day for women who are not pregnant. 0-2 drinks a day for men. Know how much alcohol is in a drink. In the [place] one drink equals one 12 oz bottle of beer (355 mL), one 5 oz glass of wine (148 mL), or one 1 oz glass of hard liquor (44 mL). Stop drinking caffeine, or drink less caffeine. General instructions  Keep a journal to find out if certain things bring on headaches. For example, write down: What you eat and drink. How much sleep you  get. Any change to your diet or medicines. Get a massage or try other ways to relax. Limit stress. Sit up straight. Do not tighten (tense) your muscles. Do not smoke or use any products that contain nicotine or tobacco. If you need help quitting, ask your doctor. Exercise regularly as told by your doctor. Get enough sleep. This often means 7-9 hours of sleep each night. Keep all follow-up visits. This is important. Contact a doctor if: Medicine does not help your symptoms. You have a headache that feels different than the other headaches. You feel like you may vomit (nauseous) or you vomit. You have a fever. Get help right away if: Your headache: Gets very bad quickly. Gets worse after a lot of physical activity. You have any of these symptoms: You continue to vomit. A stiff neck. Trouble seeing. Your eye or ear hurts. Trouble speaking. Weak muscles or you lose muscle control. You lose your balance or have trouble walking. You feel like you will pass out (faint) or you pass out. You are mixed up (confused). You have a seizure. These symptoms may be an emergency. Get help right away. Call your local emergency services (911 in the [place] Do not wait to see if the symptoms will go away. Do not drive yourself to the hospital. Summary A headache is pain or discomfort that   is felt around the head or neck area. There are many causes and types of headaches. In some cases, the cause may not be found. Keep a journal to help find out what causes your headaches. Watch your condition for any changes. Let your doctor know about them. Contact a doctor if you have a headache that is different from usual, or if medicine does not help your headache. Get help right away if your headache gets very bad, you throw up, you have trouble seeing, you lose your balance, or you have a seizure. This information is not intended to replace advice given to you by your health care provider. Make sure you  discuss any questions you have with your health care provider. Document Revised: 01/13/2021 Document Reviewed: 01/13/2021 Elsevier Patient Education  2023 Elsevier Inc.  

## 2022-02-18 ENCOUNTER — Other Ambulatory Visit: Payer: Self-pay | Admitting: Internal Medicine

## 2022-02-18 MED ORDER — RIZATRIPTAN BENZOATE 5 MG PO TBDP
5.0000 mg | ORAL_TABLET | ORAL | 0 refills | Status: DC | PRN
Start: 2022-02-18 — End: 2022-03-16

## 2022-02-20 ENCOUNTER — Encounter: Payer: Self-pay | Admitting: Emergency Medicine

## 2022-02-20 ENCOUNTER — Emergency Department: Payer: No Typology Code available for payment source

## 2022-02-20 ENCOUNTER — Emergency Department
Admission: EM | Admit: 2022-02-20 | Discharge: 2022-02-20 | Disposition: A | Discharge status: Home / Self Care | Payer: No Typology Code available for payment source | Attending: Emergency Medicine | Admitting: Emergency Medicine

## 2022-02-20 ENCOUNTER — Other Ambulatory Visit: Payer: Self-pay

## 2022-02-20 DIAGNOSIS — K2100 Gastro-esophageal reflux disease with esophagitis, without bleeding: Secondary | ICD-10-CM | POA: Diagnosis not present

## 2022-02-20 DIAGNOSIS — G8929 Other chronic pain: Secondary | ICD-10-CM

## 2022-02-20 DIAGNOSIS — R079 Chest pain, unspecified: Secondary | ICD-10-CM

## 2022-02-20 DIAGNOSIS — R002 Palpitations: Secondary | ICD-10-CM | POA: Insufficient documentation

## 2022-02-20 DIAGNOSIS — R109 Unspecified abdominal pain: Secondary | ICD-10-CM | POA: Diagnosis present

## 2022-02-20 LAB — COMPREHENSIVE METABOLIC PANEL
ALT: 34 U/L (ref 0–44)
AST: 31 U/L (ref 15–41)
Albumin: 4.2 g/dL (ref 3.5–5.0)
Alkaline Phosphatase: 69 U/L (ref 38–126)
Anion gap: 8 (ref 5–15)
BUN: 12 mg/dL (ref 6–20)
CO2: 20 mmol/L — ABNORMAL LOW (ref 22–32)
Calcium: 8.7 mg/dL — ABNORMAL LOW (ref 8.9–10.3)
Chloride: 106 mmol/L (ref 98–111)
Creatinine, Ser: 0.96 mg/dL (ref 0.61–1.24)
GFR, Estimated: >60 mL/min (ref >60)
Glucose, Bld: 163 mg/dL — ABNORMAL HIGH (ref 70–99)
Potassium: 4 mmol/L (ref 3.5–5.1)
Sodium: 134 mmol/L — ABNORMAL LOW (ref 135–145)
Total Bilirubin: 0.2 mg/dL — ABNORMAL LOW (ref 0.3–1.2)
Total Protein: 7.4 g/dL (ref 6.5–8.1)

## 2022-02-20 LAB — CBC
HCT: 44 % (ref 39.0–52.0)
Hemoglobin: 14.6 g/dL (ref 13.0–17.0)
MCH: 29.3 pg (ref 26.0–34.0)
MCHC: 33.2 g/dL (ref 30.0–36.0)
MCV: 88.2 fL (ref 80.0–100.0)
Platelets: 254 10*3/uL (ref 150–400)
RBC: 4.99 MIL/uL (ref 4.22–5.81)
RDW: 12.7 % (ref 11.5–15.5)
WBC: 9 10*3/uL (ref 4.0–10.5)
nRBC: 0 % (ref 0.0–0.2)

## 2022-02-20 LAB — LIPASE, BLOOD: Lipase: 28 U/L (ref 11–51)

## 2022-02-20 LAB — TROPONIN I (HIGH SENSITIVITY): Troponin I (High Sensitivity): 2 ng/L (ref <18)

## 2022-02-20 MED ORDER — LIDOCAINE VISCOUS HCL 2 % MT SOLN: 15.0000 mL | OROMUCOSAL | 0 refills | Status: DC | PRN

## 2022-02-22 ENCOUNTER — Emergency Department: Payer: No Typology Code available for payment source

## 2022-02-22 ENCOUNTER — Ambulatory Visit: Payer: Self-pay | Admitting: *Deleted

## 2022-02-22 ENCOUNTER — Other Ambulatory Visit: Payer: Self-pay

## 2022-02-22 ENCOUNTER — Telehealth: Payer: Self-pay | Admitting: Internal Medicine

## 2022-02-22 ENCOUNTER — Emergency Department
Admission: EM | Admit: 2022-02-22 | Discharge: 2022-02-22 | Disposition: A | Discharge status: Home / Self Care | Payer: 59 | Attending: Emergency Medicine | Admitting: Emergency Medicine

## 2022-02-22 DIAGNOSIS — F32A Depression, unspecified: Secondary | ICD-10-CM | POA: Insufficient documentation

## 2022-02-22 DIAGNOSIS — R079 Chest pain, unspecified: Secondary | ICD-10-CM | POA: Diagnosis not present

## 2022-02-22 DIAGNOSIS — Y900 Blood alcohol level of less than 20 mg/100 ml: Secondary | ICD-10-CM | POA: Insufficient documentation

## 2022-02-22 DIAGNOSIS — Z20822 Contact with and (suspected) exposure to covid-19: Secondary | ICD-10-CM | POA: Insufficient documentation

## 2022-02-22 DIAGNOSIS — F419 Anxiety disorder, unspecified: Secondary | ICD-10-CM | POA: Insufficient documentation

## 2022-02-22 DIAGNOSIS — K219 Gastro-esophageal reflux disease without esophagitis: Secondary | ICD-10-CM | POA: Diagnosis present

## 2022-02-22 DIAGNOSIS — E6609 Other obesity due to excess calories: Secondary | ICD-10-CM

## 2022-02-22 LAB — CBC WITH DIFFERENTIAL/PLATELET
Abs Immature Granulocytes: 0.03 10*3/uL (ref 0.00–0.07)
Basophils Absolute: 0 10*3/uL (ref 0.0–0.1)
Basophils Relative: 0 %
Eosinophils Absolute: 0.2 10*3/uL (ref 0.0–0.5)
Eosinophils Relative: 1 %
HCT: 46.7 % (ref 39.0–52.0)
Hemoglobin: 15.2 g/dL (ref 13.0–17.0)
Immature Granulocytes: 0 %
Lymphocytes Relative: 32 %
Lymphs Abs: 3.3 10*3/uL (ref 0.7–4.0)
MCH: 29 pg (ref 26.0–34.0)
MCHC: 32.5 g/dL (ref 30.0–36.0)
MCV: 89.1 fL (ref 80.0–100.0)
Monocytes Absolute: 0.9 10*3/uL (ref 0.1–1.0)
Monocytes Relative: 9 %
Neutro Abs: 5.9 10*3/uL (ref 1.7–7.7)
Neutrophils Relative %: 58 %
Platelets: 270 10*3/uL (ref 150–400)
RBC: 5.24 MIL/uL (ref 4.22–5.81)
RDW: 12.8 % (ref 11.5–15.5)
WBC: 10.4 10*3/uL (ref 4.0–10.5)
nRBC: 0 % (ref 0.0–0.2)

## 2022-02-22 LAB — COMPREHENSIVE METABOLIC PANEL
ALT: 39 U/L (ref 0–44)
AST: 28 U/L (ref 15–41)
Albumin: 4.8 g/dL (ref 3.5–5.0)
Alkaline Phosphatase: 74 U/L (ref 38–126)
Anion gap: 10 (ref 5–15)
BUN: 12 mg/dL (ref 6–20)
CO2: 26 mmol/L (ref 22–32)
Calcium: 9.2 mg/dL (ref 8.9–10.3)
Chloride: 103 mmol/L (ref 98–111)
Creatinine, Ser: 0.93 mg/dL (ref 0.61–1.24)
GFR, Estimated: >60 mL/min (ref >60)
Glucose, Bld: 112 mg/dL — ABNORMAL HIGH (ref 70–99)
Potassium: 3.9 mmol/L (ref 3.5–5.1)
Sodium: 139 mmol/L (ref 135–145)
Total Bilirubin: 0.5 mg/dL (ref 0.3–1.2)
Total Protein: 8.4 g/dL — ABNORMAL HIGH (ref 6.5–8.1)

## 2022-02-22 LAB — ETHANOL: Alcohol, Ethyl (B): <10 mg/dL (ref <10)

## 2022-02-22 LAB — RESP PANEL BY RT-PCR (FLU A&B, COVID) ARPGX2
Influenza A by PCR: NEGATIVE
Influenza B by PCR: NEGATIVE
SARS Coronavirus 2 by RT PCR: NEGATIVE

## 2022-02-22 LAB — SALICYLATE LEVEL: Salicylate Lvl: <7 mg/dL — ABNORMAL LOW (ref 7.0–30.0)

## 2022-02-22 LAB — TROPONIN I (HIGH SENSITIVITY): Troponin I (High Sensitivity): 3 ng/L (ref <18)

## 2022-02-22 LAB — ACETAMINOPHEN LEVEL: Acetaminophen (Tylenol), Serum: <10 ug/mL — ABNORMAL LOW (ref 10–30)

## 2022-02-22 MED ORDER — PANTOPRAZOLE SODIUM 40 MG PO PACK: 40.0000 mg | PACK | Freq: Every day | ORAL | 0 refills | Status: DC

## 2022-02-22 MED ORDER — ALUM & MAG HYDROXIDE-SIMETH 200-200-20 MG/5ML PO SUSP
30.0000 mL | Freq: Once | ORAL | Status: AC
  Administered 2022-02-22: 30 mL via ORAL
  Filled 2022-02-22: qty 30

## 2022-02-22 MED ORDER — LIDOCAINE VISCOUS HCL 2 % MT SOLN
15.0000 mL | Freq: Once | OROMUCOSAL | Status: AC
  Administered 2022-02-22: 15 mL via ORAL
  Filled 2022-02-22: qty 15

## 2022-02-22 MED ORDER — SUCRALFATE 1 GM/10ML PO SUSP: 1.0000 g | Freq: Three times a day (TID) | ORAL | 0 refills | Status: DC

## 2022-02-22 MED ORDER — PANTOPRAZOLE 2 MG/ML SUSPENSION
40.0000 mg | Freq: Every day | ORAL | Status: DC
  Administered 2022-02-22: 40 mg via ORAL
  Filled 2022-02-22: qty 20

## 2022-02-22 MED ORDER — SUCRALFATE 1 GM/10ML PO SUSP
1.0000 g | ORAL | Status: AC
Start: 2022-02-22 — End: 2022-02-22
  Administered 2022-02-22: 1 g via ORAL
  Filled 2022-02-22: qty 10

## 2022-02-22 NOTE — Consult Note (Signed)
[facility] Consult   Reason for Consult: Anxiety (SI) and SI Referring Physician: Dr. Tamala Julian Patient Identification: Timothy [name] MRN:  [id number] Principal Diagnosis: <principal problem not specified> Diagnosis:  Active Problems:   Anxiety and depression   Class 2 severe obesity due to excess calories with serious comorbidity and body mass index (BMI) of 38.0 to 38.9 in adult [facility])   Gastroesophageal reflux disease without esophagitis   Total Time spent with patient: 1 hour  Subjective: "After being here, I realized how good I have it." Timothy [name] is a 26 y.o. male patient presented to [facility] ED via POV voluntary. Per the ED triage nurses note, Pt comes with c/o anxiety and SI. Pt states he is not on any meds but wishes to be. Pt states his PCP was informed of his SI and told to come here. Pt states he doesn't think he would harm himself but he has been having the thoughts. Pt states the pain in his chest go so bad earlier today he wished not to be here. Pt denies any current SI. Pt states he was thinking of plan. Pt states he did drink heavy on Friday. Pt denies any drug use. Pt states recent stress and lost job today and states it was mutual agreement between him and boss. The patient was assessed and said, "I don't want to die." He voiced. "I am afraid of death. I was frustrated with my anxiety, and it did not seem I was getting the help I needed. After being here, I realized that I have it good." The patient appeared happy and voiced that he has a support system. The patient states he lives with his mom and has a good relationship with his boss.   This provider saw The patient face-to-face; the chart was reviewed, and consulted with Dr. Tamala Julian on 02/22/2022 due to the patient's care. It was discussed with the EDP that the patient does not meet the criteria to be admitted to the psychiatric inpatient unit.  On evaluation, the patient is alert and oriented x 4, calm,  cooperative, and mood-congruent with affect. The patient does not appear to be responding to internal or external stimuli. Neither is the patient presenting with any delusional thinking. The patient denies auditory or visual hallucinations. The patient denies any suicidal, homicidal, or self-harm ideations. The patient is not presenting with any psychotic or paranoid behaviors. During an encounter with the patient, he could answer questions appropriately.  HPI: Per Dr. Tamala Julian, Timothy [name] is a 26 y.o. male with a past medical history of diverticulitis, GERD and depression patient does not seem currently treated for presents for evaluation of worsening depression and some passive suicidal thoughts.  Patient states he also lost his job today.  He drank heavily couple days ago.  He has not had any alcohol today.  No illegal drugs.  States he has been struggling with chest pain on and off for over a month and does not feel he is got an adequate answer as far as what is causing this.  Discussed with him he was recently evaluated emergency room 3 days ago and diagnosed with GERD but he states that his pain is worse today.  States he sometimes will have cough, shortness of breath, vomiting but that he has been dealing with all of the symptoms for over a month and that they are not particularly severe or different today.  He denies any HI or hallucinations.  Past Psychiatric History: History reviewed.  No pertinent past psychiatric history  Risk to Self:   Risk to Others:   Prior Inpatient Therapy:   Prior Outpatient Therapy:    Past Medical History:  Past Medical History:  Diagnosis Date   Allergy    Depression    Diverticulitis    Dyspnea    Fainting spell    in middle school   Family history of bone cancer    Family history of breast cancer     Past Surgical History:  Procedure Laterality Date   COLECTOMY WITH COLOSTOMY CREATION/HARTMANN PROCEDURE N/A 07/22/2018   Procedure: COLECTOMY WITH  COLOSTOMY CREATION/HARTMANN PROCEDURE;  Surgeon: Fredirick Maudlin, MD;  Location: [facility] ORS;  Service: General;  Laterality: N/A;   COLONOSCOPY WITH PROPOFOL N/A 11/01/2018   Procedure: COLONOSCOPY WITH PROPOFOL;  Surgeon: [name], MD;  Location: [facility] ENDOSCOPY;  Service: Gastroenterology;  Laterality: N/A;   COLOSTOMY REVERSAL N/A 03/22/2019   Procedure: COLOSTOMY REVERSAL;  Surgeon: Fredirick Maudlin, MD;  Location: [facility] ORS;  Service: General;  Laterality: N/A;   Family History:  Family History  Problem Relation Age of Onset   Heart disease Maternal Grandmother    Diabetes Maternal Grandmother    Cancer Maternal Grandmother        breast cancer   Alport syndrome Maternal Grandmother    Cancer Paternal Grandfather        Bone cancer   Healthy Sister    Alport syndrome Maternal Aunt    Depression Mother    Cervical cancer Mother    Family Psychiatric  History:  Social History:  Social History   Substance and Sexual Activity  Alcohol Use Yes   Alcohol/week: 6.0 standard drinks of alcohol   Types: 6 Cans of beer per week   Comment: occassionally     Social History   Substance and Sexual Activity  Drug Use No    Social History   Socioeconomic History   Marital status: Single    Spouse name: Not on file   Number of children: Not on file   Years of education: Not on file   Highest education level: Not on file  Occupational History   Not on file  Tobacco Use   Smoking status: Former    Packs/day: 0.25    Types: Cigarettes   Smokeless tobacco: Current    Types: Snuff   Tobacco comments:    pt smoke pack a week  Vaping Use   Vaping Use: Every day   Last attempt to quit: 09/09/2019   Substances: Nicotine, Flavoring  Substance and Sexual Activity   Alcohol use: Yes    Alcohol/week: 6.0 standard drinks of alcohol    Types: 6 Cans of beer per week    Comment: occassionally   Drug use: No   Sexual activity: Not on file  Other Topics Concern   Not on file  Social  History Narrative   Not on file   Social Determinants of Health   Financial Resource Strain: Not on file  Food Insecurity: Not on file  Transportation Needs: Not on file  Physical Activity: Not on file  Stress: Not on file  Social Connections: Not on file   Additional Social History:    Allergies:  No Known Allergies  Labs:  Results for orders placed or performed during the hospital encounter of 02/22/22 (from the past 48 hour(s))  Comprehensive metabolic panel     Status: Abnormal   Collection Time: 02/22/22  6:30 PM  Result Value Ref Range  Sodium 139 135 - 145 mmol/L   Potassium 3.9 3.5 - 5.1 mmol/L   Chloride 103 98 - 111 mmol/L   CO2 26 22 - 32 mmol/L   Glucose, Bld 112 (H) 70 - 99 mg/dL    Comment: Glucose reference range applies only to samples taken after fasting for at least 8 hours.   BUN 12 6 - 20 mg/dL   Creatinine, Ser 0.93 0.61 - 1.24 mg/dL   Calcium 9.2 8.9 - 10.3 mg/dL   Total Protein 8.4 (H) 6.5 - 8.1 g/dL   Albumin 4.8 3.5 - 5.0 g/dL   AST 28 15 - 41 U/L   ALT 39 0 - 44 U/L   Alkaline Phosphatase 74 38 - 126 U/L   Total Bilirubin 0.5 0.3 - 1.2 mg/dL   GFR, Estimated >60 >60 mL/min    Comment: (NOTE) Calculated using the CKD-EPI Creatinine Equation (2021)    Anion gap 10 5 - 15    Comment: Performed at [facility], [address] [place], [place] [address]  Ethanol     Status: None   Collection Time: 02/22/22  6:30 PM  Result Value Ref Range   Alcohol, Ethyl (B) <10 <10 mg/dL    Comment: (NOTE) Lowest detectable limit for serum alcohol is 10 mg/dL.  For medical purposes only. Performed at [facility], [address] [place], [place] [address]   CBC with Diff     Status: None   Collection Time: 02/22/22  6:30 PM  Result Value Ref Range   WBC 10.4 4.0 - 10.5 K/uL   RBC 5.24 4.22 - 5.81 MIL/uL   Hemoglobin 15.2 13.0 - 17.0 g/dL   HCT 46.7 39.0 - 52.0 %   MCV 89.1 80.0 - 100.0 fL   MCH 29.0 26.0 - 34.0 pg   MCHC 32.5  30.0 - 36.0 g/dL   RDW 12.8 11.5 - 15.5 %   Platelets 270 150 - 400 K/uL   nRBC 0.0 0.0 - 0.2 %   Neutrophils Relative % 58 %   Neutro Abs 5.9 1.7 - 7.7 K/uL   Lymphocytes Relative 32 %   Lymphs Abs 3.3 0.7 - 4.0 K/uL   Monocytes Relative 9 %   Monocytes Absolute 0.9 0.1 - 1.0 K/uL   Eosinophils Relative 1 %   Eosinophils Absolute 0.2 0.0 - 0.5 K/uL   Basophils Relative 0 %   Basophils Absolute 0.0 0.0 - 0.1 K/uL   Immature Granulocytes 0 %   Abs Immature Granulocytes 0.03 0.00 - 0.07 K/uL    Comment: Performed at [facility], [place], [place] [address]  Troponin I (High Sensitivity)     Status: None   Collection Time: 02/22/22  6:30 PM  Result Value Ref Range   Troponin I (High Sensitivity) 3 <18 ng/L    Comment: (NOTE) Elevated high sensitivity troponin I (hsTnI) values and significant  changes across serial measurements may suggest ACS but many other  chronic and acute conditions are known to elevate hsTnI results.  Refer to the "Links" section for chest pain algorithms and additional  guidance. Performed at [facility], [place], [place] [address]   Salicylate level     Status: Abnormal   Collection Time: 02/22/22  6:30 PM  Result Value Ref Range   Salicylate Lvl <7.7 (L) 7.0 - 30.0 mg/dL    Comment: Performed at [facility], [address], [place], [place] [address]  Acetaminophen level  Status: Abnormal   Collection Time: 02/22/22  6:30 PM  Result Value Ref Range   Acetaminophen (Tylenol), Serum <10 (L) 10 - 30 ug/mL    Comment: (NOTE) Therapeutic concentrations vary significantly. A range of 10-30 ug/mL  may be an effective concentration for many patients. However, some  are best treated at concentrations outside of this range. Acetaminophen concentrations >150 ug/mL at 4 hours after ingestion  and >50 ug/mL at 12 hours after ingestion are often associated with  toxic reactions.  Performed at  [facility], [address] [place], [place] [address]   Resp Panel by RT-PCR (Flu A&B, Covid) Anterior Nasal Swab     Status: None   Collection Time: 02/22/22  8:04 PM   Specimen: Anterior Nasal Swab  Result Value Ref Range   SARS Coronavirus 2 by RT PCR NEGATIVE NEGATIVE    Comment: (NOTE) SARS-CoV-2 target nucleic acids are NOT DETECTED.  The SARS-CoV-2 RNA is generally detectable in upper respiratory specimens during the acute phase of infection. The lowest concentration of SARS-CoV-2 viral copies this assay can detect is 138 copies/mL. A negative result does not preclude SARS-Cov-2 infection and should not be used as the sole basis for treatment or other patient management decisions. A negative result may occur with  improper specimen collection/handling, submission of specimen other than nasopharyngeal swab, presence of viral mutation(s) within the areas targeted by this assay, and inadequate number of viral copies(<138 copies/mL). A negative result must be combined with clinical observations, patient history, and epidemiological information. The expected result is Negative.  Fact Sheet for Patients:  EntrepreneurPulse.com.au  Fact Sheet for Healthcare Providers:  IncredibleEmployment.be  This test is no t yet approved or cleared by the [place] FDA and  has been authorized for detection and/or diagnosis of SARS-CoV-2 by FDA under an Emergency Use Authorization (EUA). This EUA will remain  in effect (meaning this test can be used) for the duration of the COVID-19 declaration under Section 564(b)(1) of the Act, 21 U.S.C.section 360bbb-3(b)(1), unless the authorization is terminated  or revoked sooner.       Influenza A by PCR NEGATIVE NEGATIVE   Influenza B by PCR NEGATIVE NEGATIVE    Comment: (NOTE) The Xpert Xpress SARS-CoV-2/FLU/RSV plus assay is intended as an aid in the diagnosis of influenza from Nasopharyngeal  swab specimens and should not be used as a sole basis for treatment. Nasal washings and aspirates are unacceptable for Xpert Xpress SARS-CoV-2/FLU/RSV testing.  Fact Sheet for Patients: EntrepreneurPulse.com.au  Fact Sheet for Healthcare Providers: IncredibleEmployment.be  This test is not yet approved or cleared by the [place] FDA and has been authorized for detection and/or diagnosis of SARS-CoV-2 by FDA under an Emergency Use Authorization (EUA). This EUA will remain in effect (meaning this test can be used) for the duration of the COVID-19 declaration under Section 564(b)(1) of the Act, 21 U.S.C. section 360bbb-3(b)(1), unless the authorization is terminated or revoked.  Performed at [facility], [address] [place], [place] [address]     Current Facility-Administered Medications  Medication Dose Route Frequency Provider Last Rate Last Admin   pantoprazole sodium (PROTONIX) 40 mg/20 mL oral suspension 40 mg  40 mg Oral Daily Lucrezia Starch, MD   40 mg at 02/22/22 2034   Current Outpatient Medications  Medication Sig Dispense Refill   hydrOXYzine (ATARAX) 10 MG/5ML syrup Take 12.5 mLs (25 mg total) by mouth 3 (three) times daily as needed for up to 20 doses for anxiety.  240 mL 0   lidocaine (XYLOCAINE) 2 % solution Use as directed 15 mLs in the mouth or throat as needed for mouth pain. 100 mL 0   pantoprazole sodium (PROTONIX) 40 mg Take 40 mg by mouth daily. 30 each 0   rizatriptan (MAXALT-MLT) 5 MG disintegrating tablet Take 1 tablet (5 mg total) by mouth as needed for migraine. May repeat in 2 hours if needed 10 tablet 0   sucralfate (CARAFATE) 1 GM/10ML suspension Take 10 mLs (1 g total) by mouth 4 (four) times daily -  with meals and at bedtime for 5 days. 200 mL 0    Musculoskeletal: Strength & Muscle Tone: within normal limits Gait & Station: normal Patient leans: N/A  Psychiatric Specialty  Exam:  Presentation  General Appearance: Appropriate for Environment  Eye Contact:Good  Speech:Clear and Coherent  Speech Volume:Normal  Handedness:Right   Mood and Affect  Mood:Euthymic  Affect:Appropriate   Thought Process  Thought Processes:Coherent  Descriptions of Associations:Intact  Orientation:Full (Time, Place and Person)  Thought Content:Logical  History of Schizophrenia/Schizoaffective disorder:No data recorded Duration of Psychotic Symptoms:No data recorded Hallucinations:Hallucinations: None  Ideas of Reference:None  Suicidal Thoughts:Suicidal Thoughts: No  Homicidal Thoughts:Homicidal Thoughts: No   Sensorium  Memory:Immediate Good; Recent Good; Remote Good  Judgment:Good  Insight:Good   Executive Functions  Concentration:Good  Attention Span:Good  [place] of Knowledge:Good  Language:Good   Psychomotor Activity  Psychomotor Activity:Psychomotor Activity: Normal   Assets  Assets:Communication Skills; Desire for Improvement; Social Support; Resilience   Sleep  Sleep:Sleep: Good Number of Hours of Sleep: 8   Physical Exam: Physical Exam Vitals and nursing note reviewed.  Constitutional:      Appearance: Normal appearance. He is obese.  HENT:     Head: Normocephalic and atraumatic.     Right Ear: External ear normal.     Left Ear: External ear normal.     Nose: Nose normal.  Cardiovascular:     Rate and Rhythm: Normal rate.     Pulses: Normal pulses.  Pulmonary:     Effort: Pulmonary effort is normal.  Musculoskeletal:        General: Normal range of motion.     Cervical back: Normal range of motion and neck supple.  Neurological:     General: No focal deficit present.     Mental Status: He is alert and oriented to person, place, and time.  Psychiatric:        Mood and Affect: Mood normal.        Behavior: Behavior normal.    ROS Blood pressure (!) 132/92, pulse 78, temperature 98.5 F (36.9 C),  resp. rate 19, SpO2 100 %. There is no height or weight on file to calculate BMI.  Treatment Plan Summary: Plan Patient does not meet criteria for psychiatric inpatient admission  Disposition: No evidence of imminent risk to self or others at present.   Patient does not meet criteria for psychiatric inpatient admission. Supportive therapy provided about ongoing stressors. Discussed crisis plan, support from social network, calling 911, coming to the Emergency Department, and calling Suicide Hotline.  Caroline Sauger, NP 02/22/2022 10:11 PM

## 2022-02-23 ENCOUNTER — Ambulatory Visit: Payer: No Typology Code available for payment source | Admitting: Internal Medicine

## 2022-02-23 ENCOUNTER — Emergency Department
Admission: EM | Admit: 2022-02-23 | Discharge: 2022-02-24 | Disposition: A | Discharge status: Home / Self Care | Payer: No Typology Code available for payment source | Attending: Emergency Medicine | Admitting: Emergency Medicine

## 2022-02-23 ENCOUNTER — Other Ambulatory Visit: Payer: Self-pay

## 2022-02-23 ENCOUNTER — Emergency Department: Payer: No Typology Code available for payment source

## 2022-02-23 DIAGNOSIS — E876 Hypokalemia: Secondary | ICD-10-CM | POA: Diagnosis not present

## 2022-02-23 DIAGNOSIS — F17200 Nicotine dependence, unspecified, uncomplicated: Secondary | ICD-10-CM | POA: Insufficient documentation

## 2022-02-23 DIAGNOSIS — R0789 Other chest pain: Secondary | ICD-10-CM | POA: Insufficient documentation

## 2022-02-23 DIAGNOSIS — R072 Precordial pain: Secondary | ICD-10-CM | POA: Diagnosis present

## 2022-02-23 DIAGNOSIS — T40725A Adverse effect of synthetic cannabinoids, initial encounter: Secondary | ICD-10-CM | POA: Insufficient documentation

## 2022-02-23 DIAGNOSIS — F419 Anxiety disorder, unspecified: Secondary | ICD-10-CM | POA: Insufficient documentation

## 2022-02-23 DIAGNOSIS — R079 Chest pain, unspecified: Secondary | ICD-10-CM

## 2022-02-23 LAB — CBC
HCT: 47.2 % (ref 39.0–52.0)
Hemoglobin: 15.9 g/dL (ref 13.0–17.0)
MCH: 29.1 pg (ref 26.0–34.0)
MCHC: 33.7 g/dL (ref 30.0–36.0)
MCV: 86.3 fL (ref 80.0–100.0)
Platelets: 304 10*3/uL (ref 150–400)
RBC: 5.47 MIL/uL (ref 4.22–5.81)
RDW: 12.4 % (ref 11.5–15.5)
WBC: 16.3 10*3/uL — ABNORMAL HIGH (ref 4.0–10.5)
nRBC: 0 % (ref 0.0–0.2)

## 2022-02-23 LAB — BASIC METABOLIC PANEL
Anion gap: 12 (ref 5–15)
BUN: 13 mg/dL (ref 6–20)
CO2: 23 mmol/L (ref 22–32)
Calcium: 9.8 mg/dL (ref 8.9–10.3)
Chloride: 106 mmol/L (ref 98–111)
Creatinine, Ser: 1.2 mg/dL (ref 0.61–1.24)
GFR, Estimated: >60 mL/min (ref >60)
Glucose, Bld: 106 mg/dL — ABNORMAL HIGH (ref 70–99)
Potassium: 3 mmol/L — ABNORMAL LOW (ref 3.5–5.1)
Sodium: 141 mmol/L (ref 135–145)

## 2022-02-23 LAB — HEPATIC FUNCTION PANEL
ALT: 44 U/L (ref 0–44)
AST: 31 U/L (ref 15–41)
Albumin: 4.7 g/dL (ref 3.5–5.0)
Alkaline Phosphatase: 74 U/L (ref 38–126)
Bilirubin, Direct: <0.1 mg/dL (ref 0.0–0.2)
Total Bilirubin: 0.5 mg/dL (ref 0.3–1.2)
Total Protein: 7.9 g/dL (ref 6.5–8.1)

## 2022-02-23 LAB — TROPONIN I (HIGH SENSITIVITY)
Troponin I (High Sensitivity): 3 ng/L (ref <18)
Troponin I (High Sensitivity): 3 ng/L (ref <18)

## 2022-02-23 LAB — LIPASE, BLOOD: Lipase: 34 U/L (ref 11–51)

## 2022-02-23 MED ORDER — POTASSIUM CHLORIDE 20 MEQ PO PACK
40.0000 meq | PACK | Freq: Once | ORAL | Status: AC
  Administered 2022-02-23: 40 meq via ORAL
  Filled 2022-02-23: qty 2

## 2022-02-23 MED ORDER — SODIUM CHLORIDE 0.9 % IV BOLUS
1000.0000 mL | Freq: Once | INTRAVENOUS | Status: AC
  Administered 2022-02-23: 1000 mL via INTRAVENOUS

## 2022-02-23 MED ORDER — LORAZEPAM 2 MG/ML IJ SOLN
2.0000 mg | Freq: Once | INTRAMUSCULAR | Status: AC
Start: 2022-02-23 — End: 2022-02-23
  Administered 2022-02-23: 2 mg via INTRAVENOUS
  Filled 2022-02-23: qty 1

## 2022-02-23 MED ORDER — ONDANSETRON HCL 4 MG/2ML IJ SOLN
4.0000 mg | Freq: Once | INTRAMUSCULAR | Status: AC
Start: 2022-02-23 — End: 2022-02-23
  Administered 2022-02-23: 4 mg via INTRAVENOUS
  Filled 2022-02-23: qty 2

## 2022-02-23 MED ORDER — KETOROLAC TROMETHAMINE 30 MG/ML IJ SOLN
15.0000 mg | Freq: Once | INTRAMUSCULAR | Status: AC
Start: 2022-02-23 — End: 2022-02-23
  Administered 2022-02-23: 15 mg via INTRAVENOUS
  Filled 2022-02-23: qty 1

## 2022-02-23 MED ORDER — FAMOTIDINE IN NACL 20-0.9 MG/50ML-% IV SOLN
20.0000 mg | Freq: Once | INTRAVENOUS | Status: AC
  Administered 2022-02-23: 20 mg via INTRAVENOUS
  Filled 2022-02-23: qty 50

## 2022-02-23 NOTE — ED Provider Notes (Signed)
[facility] Provider Note    Event Date/Time   First MD Initiated Contact with Patient 02/23/22 2303     (approximate)   History   Anxiety  and Chest Pain   HPI  Timothy [name] is a 26 y.o. male brought to the ED via EMS from home with a chief complaint of chest pain and anxiety after smoking a CBD tablet for the first time.  Patient endorses shortness of breath and substernal/epigastric burning with nausea.  States he drink alcohol several days ago after abstaining for several months.  Reports he had approximately 122 ounces of beer at that time.  Denies recent fever, cough, vomiting, hematuria or diarrhea.  Denies recent travel, trauma or hormone use.     Past Medical History   Past Medical History:  Diagnosis Date  . Allergy   . Depression   . Diverticulitis   . Dyspnea   . Fainting spell    in middle school  . Family history of bone cancer   . Family history of breast cancer      Active Problem List   Patient Active Problem List   Diagnosis Date Noted  . Leukocytosis 02/02/2022  . Gastroesophageal reflux disease without esophagitis 01/25/2022  . Class 2 severe obesity due to excess calories with serious comorbidity and body mass index (BMI) of 38.0 to 38.9 in adult [facility]) 03/29/2021  . Anxiety and depression 07/28/2017     Past Surgical History   Past Surgical History:  Procedure Laterality Date  . COLECTOMY WITH COLOSTOMY CREATION/HARTMANN PROCEDURE N/A 07/22/2018   Procedure: COLECTOMY WITH COLOSTOMY CREATION/HARTMANN PROCEDURE;  Surgeon: Fredirick Maudlin, MD;  Location: [facility] ORS;  Service: General;  Laterality: N/A;  . COLONOSCOPY WITH PROPOFOL N/A 11/01/2018   Procedure: COLONOSCOPY WITH PROPOFOL;  Surgeon: [name], MD;  Location: [facility] ENDOSCOPY;  Service: Gastroenterology;  Laterality: N/A;  . COLOSTOMY REVERSAL N/A 03/22/2019   Procedure: COLOSTOMY REVERSAL;  Surgeon: Fredirick Maudlin, MD;  Location: [facility] ORS;  Service:  General;  Laterality: N/A;     Home Medications   Prior to Admission medications   Medication Sig Start Date End Date Taking? Authorizing Provider  hydrOXYzine (ATARAX) 10 MG/5ML syrup Take 12.5 mLs (25 mg total) by mouth 3 (three) times daily as needed for up to 20 doses for anxiety. 01/25/22   Jearld Fenton, NP  lidocaine (XYLOCAINE) 2 % solution Use as directed 15 mLs in the mouth or throat as needed for mouth pain. 02/20/22   Blake Divine, MD  pantoprazole sodium (PROTONIX) 40 mg Take 40 mg by mouth daily. 02/22/22 03/24/22  Lucrezia Starch, MD  rizatriptan (MAXALT-MLT) 5 MG disintegrating tablet Take 1 tablet (5 mg total) by mouth as needed for migraine. May repeat in 2 hours if needed 02/18/22   Jearld Fenton, NP  sucralfate (CARAFATE) 1 GM/10ML suspension Take 10 mLs (1 g total) by mouth 4 (four) times daily -  with meals and at bedtime for 5 days. 02/22/22 02/27/22  Lucrezia Starch, MD     Allergies  Patient has no known allergies.   Family History   Family History  Problem Relation Age of Onset  . Heart disease Maternal Grandmother   . Diabetes Maternal Grandmother   . Cancer Maternal Grandmother        breast cancer  . Alport syndrome Maternal Grandmother   . Cancer Paternal Grandfather        Bone cancer  . Healthy Sister   .  Alport syndrome Maternal Aunt   . Depression Mother   . Cervical cancer Mother      Physical Exam  Triage Vital Signs: ED Triage Vitals  Enc Vitals Group     BP 02/23/22 2028 102/80     Pulse Rate 02/23/22 2028 91     Resp 02/23/22 2028 (!) 24     Temp 02/23/22 2028 98.4 F (36.9 C)     Temp Source 02/23/22 2028 Oral     SpO2 02/23/22 2028 99 %     Weight 02/23/22 2027 274 lb (124.3 kg)     Height --      Head Circumference --      Peak Flow --      Pain Score 02/23/22 2026 10     Pain Loc --      Pain Edu? --      Excl. in [place]? --     Updated Vital Signs: BP 124/65   Pulse 65   Temp 98.4 F (36.9 C) (Oral)   Resp 17    Wt 124.3 kg   SpO2 95%   BMI 37.16 kg/m    General: Awake, mild distress.  CV:  RRR.  Good peripheral perfusion.  Resp:  Normal effort.  CTA B.  Anterior chest tender to palpation. Abd:  Mild epigastric tenderness to palpation without rebound or guarding.  No CVAT.  No truncal vesicles.  No distention.  Other:  Bilateral calves are nonswollen and nontender.   ED Results / Procedures / Treatments  Labs (all labs ordered are listed, but only abnormal results are displayed) Labs Reviewed  BASIC METABOLIC PANEL - Abnormal; Notable for the following components:      Result Value   Potassium 3.0 (*)    Glucose, Bld 106 (*)    All other components within normal limits  CBC - Abnormal; Notable for the following components:   WBC 16.3 (*)    All other components within normal limits  HEPATIC FUNCTION PANEL  LIPASE, BLOOD  TROPONIN I (HIGH SENSITIVITY)  TROPONIN I (HIGH SENSITIVITY)     EKG  ED ECG REPORT I, Dasan Hardman J, the attending physician, personally viewed and interpreted this ECG.   Date: 02/23/2022  EKG Time: 2026  Rate: 101  Rhythm: normal sinus rhythm  Axis: Normal  Intervals:none  ST&T Change: Nonspecific    RADIOLOGY I have independently visualized and interpreted patient's chest x-ray as well as noted the radiology interpretation:  Chest x-ray: No acute cardiopulmonary process  Official radiology report(s): DG Chest 1 View  Result Date: 02/23/2022 CLINICAL DATA:  Chest pain, shortness of breath, and anxiety. EXAM: CHEST  1 VIEW COMPARISON:  02/22/2022 FINDINGS: The heart size and mediastinal contours are within normal limits. Both lungs are clear. The visualized skeletal structures are unremarkable. IMPRESSION: No active disease. Electronically Signed   By: Lucienne Capers M.D.   On: 02/23/2022 20:49     PROCEDURES:  Critical Care performed: No  .1-3 Lead EKG Interpretation  Performed by: Paulette Blanch, MD Authorized by: Paulette Blanch, MD      Interpretation: normal     ECG rate:  90   ECG rate assessment: normal     Rhythm: sinus rhythm     Ectopy: none     Conduction: normal   Comments:     Patient placed on cardiac monitor to evaluate for arrhythmias    MEDICATIONS ORDERED IN ED: Medications  sodium chloride 0.9 % bolus 1,000 mL (1,000 mLs  Intravenous New Bag/Given 02/23/22 2353)  LORazepam (ATIVAN) injection 2 mg (2 mg Intravenous Given 02/23/22 2343)  famotidine (PEPCID) IVPB 20 mg premix (20 mg Intravenous New Bag/Given 02/23/22 2353)  ondansetron (ZOFRAN) injection 4 mg (4 mg Intravenous Given 02/23/22 2341)  ketorolac (TORADOL) 30 MG/ML injection 15 mg (15 mg Intravenous Given 02/23/22 2342)  potassium chloride (KLOR-CON) packet 40 mEq (40 mEq Oral Given 02/23/22 2354)     IMPRESSION / MDM / ASSESSMENT AND PLAN / ED COURSE  I reviewed the triage vital signs and the nursing notes.                             26 year old male presenting with chest pain and heightened anxiety after smoking CBD tablet for the first time. Differential diagnosis includes, but is not limited to, ACS, aortic dissection, pulmonary embolism, cardiac tamponade, pneumothorax, pneumonia, pericarditis, myocarditis, GI-related causes including esophagitis/gastritis, and musculoskeletal chest wall pain.   I have personally reviewed patient's records and see mostly emergency department visits for chest pain and migraines.  Patient tells me he last had a cardiology visit in 2019.  Patient's presentation is most consistent with acute, uncomplicated illness.  The patient is on the cardiac monitor to evaluate for evidence of arrhythmia and/or significant heart rate changes.  Laboratory results demonstrate moderate leukocytosis 16.3, mild hypokalemia potassium 3.0, initial troponin, EKG and chest x-ray unremarkable.  Patient is pending repeat troponin.  We will add LFTs/lipase given binge drinking over the weekend.  Administer IV fluid hydration, IV ketorolac  for pain, IV Pepcid for gastritis, IV Ativan for anxiety, IV Zofran for nausea.  Liquid potassium replacement as patient claims he cannot take pills.  Will reassess.  Clinical Course as of 02/24/22 0030  Thu Feb 24, 2022  0028 Updated patient and family member of repeat negative troponin, unremarkable LFTs/lipase.  He is feeling significantly better, currently eating multiple packets of crackers and peanut butter, smiling.  Will place him on Carafate and any PPI that may come in liquid form.  Encouraged him to stop using marijuana.  Will place internal referral to cardiology for outpatient follow-up.  Also refer to [facility] for counseling for his anxiety.  Strict return precautions given.  Patient and family member verbalized understanding and agree with plan of care. [JS]    Clinical Course User Index [JS] Paulette Blanch, MD     FINAL CLINICAL IMPRESSION(S) / ED DIAGNOSES   Final diagnoses:  Nonspecific chest pain  Chest wall pain  Anxiety  Adverse effect of synthetic cannabinoid, initial encounter  Hypokalemia     Rx / DC Orders   ED Discharge Orders     None        Note:  This document was prepared using Dragon voice recognition software and may include unintentional dictation errors.   Paulette Blanch, MD 02/24/22 [phone]

## 2022-02-23 NOTE — Telephone Encounter (Signed)
He can schedule an appointment to discuss this.  His options are limited because he cannot swallow pills.  There is nothing I can do at this point to speed up the psychiatry referral.

## 2022-02-23 NOTE — Telephone Encounter (Signed)
This is a second time he has called.  Again there is nothing I can do to speed up the psychiatry referral.  He can always go to [facility] in [place] if he needs immediate assistance.

## 2022-02-23 NOTE — ED Triage Notes (Signed)
Pt BIB EMS for chest pain after smoking a CBD tab. Pt very anxious in triage. Pt endorses SOB.

## 2022-02-24 MED ORDER — SUCRALFATE 1 GM/10ML PO SUSP: 1.0000 g | Freq: Four times a day (QID) | ORAL | 1 refills | Status: DC

## 2022-02-24 MED ORDER — PANTOPRAZOLE SODIUM 40 MG PO PACK: 40.0000 mg | PACK | Freq: Every day | ORAL | 0 refills | Status: DC

## 2022-02-24 NOTE — ED Notes (Signed)
Pt given graham crackers and peanut butter.

## 2022-02-24 NOTE — Telephone Encounter (Signed)
Pt advised.  He is going to try [facility]   Thanks,   -Mickel Baas

## 2022-02-24 NOTE — Telephone Encounter (Signed)
Pt advised.   He is going to try [facility]   Thanks,   -Mickel Baas

## 2022-02-24 NOTE — Discharge Instructions (Addendum)
1.  Stop using marijuana. 2.  Please take the liquid medicines as prescribed for you: Carafate 3 times daily with meals and at bedtime Protonix 40 mg daily 3.  Drink alcohol only in moderation. 4.  Return to the ER for worsening symptoms, persistent vomiting, difficulty breathing or other concerns.

## 2022-02-25 ENCOUNTER — Other Ambulatory Visit: Payer: Self-pay

## 2022-02-25 ENCOUNTER — Encounter: Payer: Self-pay | Admitting: Emergency Medicine

## 2022-02-25 ENCOUNTER — Emergency Department
Admission: EM | Admit: 2022-02-25 | Discharge: 2022-02-25 | Discharge status: Against Medical Advice | Payer: No Typology Code available for payment source | Attending: Emergency Medicine | Admitting: Emergency Medicine

## 2022-02-25 DIAGNOSIS — Z5321 Procedure and treatment not carried out due to patient leaving prior to being seen by health care provider: Secondary | ICD-10-CM | POA: Insufficient documentation

## 2022-02-25 DIAGNOSIS — R079 Chest pain, unspecified: Secondary | ICD-10-CM | POA: Insufficient documentation

## 2022-02-25 NOTE — ED Triage Notes (Signed)
Pt to ED via POV, pt c/o continued CP at this time. Pt states pain worse when he lays down and tries to rest. Pt states sleeping about 6 hrs of sleep per night.    Pt states CP resolved yesterday after being given Ativan, pt states was also given Toradol but he believes it was not effective in relieving his pain.

## 2022-02-25 NOTE — ED Notes (Addendum)
Will obtain EKG.  Patient seen several times in June for same.  Labs, CXR done yesterday, the day before and two days before that.  Will hold on repeat labs and xray at this time.    Blythe Veach, Delice Bison, DO 02/25/22 [phone]

## 2022-02-25 NOTE — ED Notes (Signed)
Per Dr. Leonides Schanz, only EKG in triage at this time.

## 2022-02-26 ENCOUNTER — Emergency Department
Admission: EM | Admit: 2022-02-26 | Discharge: 2022-02-26 | Disposition: A | Discharge status: Home / Self Care | Payer: No Typology Code available for payment source | Attending: Emergency Medicine | Admitting: Emergency Medicine

## 2022-02-26 ENCOUNTER — Other Ambulatory Visit: Payer: Self-pay

## 2022-02-26 ENCOUNTER — Emergency Department: Payer: No Typology Code available for payment source

## 2022-02-26 DIAGNOSIS — R079 Chest pain, unspecified: Secondary | ICD-10-CM | POA: Diagnosis present

## 2022-02-26 DIAGNOSIS — F17200 Nicotine dependence, unspecified, uncomplicated: Secondary | ICD-10-CM | POA: Insufficient documentation

## 2022-02-26 LAB — BASIC METABOLIC PANEL
Anion gap: 6 (ref 5–15)
BUN: 10 mg/dL (ref 6–20)
CO2: 29 mmol/L (ref 22–32)
Calcium: 9.6 mg/dL (ref 8.9–10.3)
Chloride: 103 mmol/L (ref 98–111)
Creatinine, Ser: 0.88 mg/dL (ref 0.61–1.24)
GFR, Estimated: >60 mL/min (ref >60)
Glucose, Bld: 102 mg/dL — ABNORMAL HIGH (ref 70–99)
Potassium: 4 mmol/L (ref 3.5–5.1)
Sodium: 138 mmol/L (ref 135–145)

## 2022-02-26 LAB — CBC
HCT: 46.2 % (ref 39.0–52.0)
Hemoglobin: 15.1 g/dL (ref 13.0–17.0)
MCH: 29 pg (ref 26.0–34.0)
MCHC: 32.7 g/dL (ref 30.0–36.0)
MCV: 88.8 fL (ref 80.0–100.0)
Platelets: 233 10*3/uL (ref 150–400)
RBC: 5.2 MIL/uL (ref 4.22–5.81)
RDW: 12.8 % (ref 11.5–15.5)
WBC: 8.1 10*3/uL (ref 4.0–10.5)
nRBC: 0 % (ref 0.0–0.2)

## 2022-02-26 LAB — TROPONIN I (HIGH SENSITIVITY)
Troponin I (High Sensitivity): 7 ng/L (ref <18)
Troponin I (High Sensitivity): 7 ng/L (ref <18)

## 2022-02-26 NOTE — Discharge Instructions (Addendum)
Please follow up with Dr. Stark Klein, cardiology, at 1030 AM Monday.  Return as needed.  Everything today look good.

## 2022-02-26 NOTE — ED Notes (Signed)
Pt in bed, mom at bedside, pt reports decreased pain, explained plan of care and follow up, pt verbalized understanding. Pt from dpt

## 2022-02-26 NOTE — ED Provider Notes (Signed)
[facility] Provider Note    Event Date/Time   First MD Initiated Contact with Patient 02/26/22 1010     (approximate)   History   Chest Pain   HPI  Timothy [name] is a 26 y.o. male who comes in complaining of chest pain.  He reports woke him up around 9 and he was walking down the hallway to sit on the couch and with every step it got tighter and he got to the couch and now it is better EMS gave him some aspirin.  Had a visit here for chest pain on 10 June on 25 June 27 June and 28 June.  Last time it happened after he smoked a CBD tablet and apparently had some alcohol.  He had 122 ounces of beer at 1 point.    Past history diverticulitis with sigmoid colon removed and ostomy which was then taken down later.  Physical Exam   Triage Vital Signs: ED Triage Vitals  Enc Vitals Group     BP 02/26/22 1015 122/74     Pulse Rate 02/26/22 1013 64     Resp 02/26/22 1013 18     Temp --      Temp src --      SpO2 02/26/22 1015 97 %     Weight --      Height --      Head Circumference --      Peak Flow --      Pain Score 02/26/22 1013 7     Pain Loc --      Pain Edu? --      Excl. in [place]? --     Most recent vital signs: Vitals:   02/26/22 1245 02/26/22 1300  BP:  115/65  Pulse: (!) 55 86  Resp:  18  Temp:    SpO2: 95% 96%    General: Awake, alert and anxious in no distress CV:  Good peripheral perfusion.  Heart regular rate and rhythm no audible murmur Resp:  Normal effort.  Lungs are clear Abd:  No distention.  Abdomen soft bowel sounds are positive there is no tenderness there is a large midline scar and some other scars where he does have a colostomy which was taken down. Extremities: No edema   ED Results / Procedures / Treatments   Labs (all labs ordered are listed, but only abnormal results are displayed) Labs Reviewed  BASIC METABOLIC PANEL - Abnormal; Notable for the following components:      Result Value   Glucose, Bld 102 (*)     All other components within normal limits  CBC  TROPONIN I (HIGH SENSITIVITY)  TROPONIN I (HIGH SENSITIVITY)     EKG  EKG read and interpreted by me shows sinus bradycardia at 58 normal axis no acute ST-T wave changes   RADIOLOGY Chest x-ray read by radiology read interpreted again by me shows no acute disease   PROCEDURES:  Critical Care performed:   Procedures   MEDICATIONS ORDERED IN ED: Medications - No data to display   IMPRESSION / MDM / [place] / ED COURSE  I reviewed the triage vital signs and the nursing notes. Patient with frequent visits for chest pain I did call Dr. Sharyn Creamer and was able to get him an appointment on Monday.  I will have the patient return as needed.  Differential diagnosis includes, but is not limited to, angina or pleurisy more likely though this is nonspecific chest  pain and anxiety.  Patient has never had any findings during any of his visits that I was able to determine.  Patient's presentation is most consistent with acute complicated illness / injury requiring diagnostic workup.  The patient is on the cardiac monitor to evaluate for evidence of arrhythmia and/or significant heart rate changes.  None were seen      FINAL CLINICAL IMPRESSION(S) / ED DIAGNOSES   Final diagnoses:  Nonspecific chest pain     Rx / DC Orders   ED Discharge Orders     None        Note:  This document was prepared using Dragon voice recognition software and may include unintentional dictation errors.   Nena Polio, MD 02/26/22 1310

## 2022-02-26 NOTE — ED Triage Notes (Signed)
Pt comes pov with chest pain. Has been a recurring issue and seen here for it multiple times. '324mg'$  aspirin given by ems. VSS. NSR.

## 2022-03-01 ENCOUNTER — Other Ambulatory Visit: Payer: Self-pay

## 2022-03-01 ENCOUNTER — Emergency Department: Payer: No Typology Code available for payment source

## 2022-03-01 ENCOUNTER — Encounter: Payer: Self-pay | Admitting: Emergency Medicine

## 2022-03-01 DIAGNOSIS — R079 Chest pain, unspecified: Secondary | ICD-10-CM | POA: Diagnosis present

## 2022-03-01 DIAGNOSIS — D72829 Elevated white blood cell count, unspecified: Secondary | ICD-10-CM | POA: Diagnosis not present

## 2022-03-01 DIAGNOSIS — R0789 Other chest pain: Secondary | ICD-10-CM | POA: Diagnosis not present

## 2022-03-01 DIAGNOSIS — R197 Diarrhea, unspecified: Secondary | ICD-10-CM | POA: Diagnosis not present

## 2022-03-01 DIAGNOSIS — R1030 Lower abdominal pain, unspecified: Secondary | ICD-10-CM | POA: Diagnosis not present

## 2022-03-01 DIAGNOSIS — R0600 Dyspnea, unspecified: Secondary | ICD-10-CM | POA: Diagnosis not present

## 2022-03-01 LAB — CBC
HCT: 43.9 % (ref 39.0–52.0)
Hemoglobin: 14.6 g/dL (ref 13.0–17.0)
MCH: 29 pg (ref 26.0–34.0)
MCHC: 33.3 g/dL (ref 30.0–36.0)
MCV: 87.3 fL (ref 80.0–100.0)
Platelets: 233 10*3/uL (ref 150–400)
RBC: 5.03 MIL/uL (ref 4.22–5.81)
RDW: 12.6 % (ref 11.5–15.5)
WBC: 14.1 10*3/uL — ABNORMAL HIGH (ref 4.0–10.5)
nRBC: 0 % (ref 0.0–0.2)

## 2022-03-01 LAB — BASIC METABOLIC PANEL
Anion gap: 6 (ref 5–15)
BUN: 14 mg/dL (ref 6–20)
CO2: 28 mmol/L (ref 22–32)
Calcium: 9.3 mg/dL (ref 8.9–10.3)
Chloride: 106 mmol/L (ref 98–111)
Creatinine, Ser: 1.04 mg/dL (ref 0.61–1.24)
GFR, Estimated: >60 mL/min (ref >60)
Glucose, Bld: 110 mg/dL — ABNORMAL HIGH (ref 70–99)
Potassium: 3.7 mmol/L (ref 3.5–5.1)
Sodium: 140 mmol/L (ref 135–145)

## 2022-03-01 LAB — TROPONIN I (HIGH SENSITIVITY)
Troponin I (High Sensitivity): 7 ng/L (ref <18)
Troponin I (High Sensitivity): 5 ng/L (ref <18)

## 2022-03-01 NOTE — ED Triage Notes (Signed)
Pt to ED via POV with c/o chest pain, pt having same pain has has been seen several times with. Pt c/o pain to L upper chest and L arm. Pt states "nobody is ever right there when it's at it's worst". Pt able to speak in full and complete sentences in triage. Pt c/o throbbing pain to L arm.

## 2022-03-02 ENCOUNTER — Emergency Department
Admission: EM | Admit: 2022-03-02 | Discharge: 2022-03-02 | Disposition: A | Discharge status: Home / Self Care | Payer: No Typology Code available for payment source | Attending: Emergency Medicine | Admitting: Emergency Medicine

## 2022-03-02 DIAGNOSIS — R079 Chest pain, unspecified: Secondary | ICD-10-CM

## 2022-03-02 MED ORDER — ALUM & MAG HYDROXIDE-SIMETH 200-200-20 MG/5ML PO SUSP
30.0000 mL | Freq: Once | ORAL | Status: AC
  Administered 2022-03-02: 30 mL via ORAL
  Filled 2022-03-02: qty 30

## 2022-03-02 NOTE — Discharge Instructions (Signed)
Your heart work-up today was reassuring.  Please feel follow-up with gastroenterologist and cardiologist as you are scheduled to do.

## 2022-03-02 NOTE — ED Provider Notes (Signed)
[facility] Provider Note    Event Date/Time   First MD Initiated Contact with Patient 03/02/22 [phone]     (approximate)   History   Chest Pain   HPI  Timothy [name] is a 26 y.o. male medical history of anxiety depression who presents with chest pain.  Patient notes that he was feeling well for the last several days and then tonight around 7 PM went to wake up his mom when he had acute onset of midsternal chest pain.  Lasted for about an hour and is since resolved since he is been here.  It is sharp in quality associated with some mild dyspnea.  Also endorses some general lower abdominal pain has had several episodes of diarrhea while in the emergency department.  Patient has had multiple ED visits for similar.  He saw cardiology yesterday, and by report was told that he did not have a heart attack but is going to have a stress test and echo.  He was not happy with the visit.  He is also scheduled to see GI for colonoscopy and EGD next month.  Patient has been prescribed PPI and Carafate but he is unable to take pills and also does not have insurance cannot afford this has not been taking it.  Denies drug or alcohol use currently.  He is primary care doctor is trying to arrange psychiatry for him but he does not have an appointment scheduled yet.  Has been referred to [facility] but has not gone yet.  Patient with multiple recent stressors this month including losing his insurance and difficulty at work.  Denies suicidal thoughts.    Past Medical History:  Diagnosis Date   Allergy    Depression    Diverticulitis    Dyspnea    Fainting spell    in middle school   Family history of bone cancer    Family history of breast cancer     Patient Active Problem List   Diagnosis Date Noted   Leukocytosis 02/02/2022   Gastroesophageal reflux disease without esophagitis 01/25/2022   Class 2 severe obesity due to excess calories with serious comorbidity and body mass index (BMI)  of 38.0 to 38.9 in adult [facility]) 03/29/2021   Anxiety and depression 07/28/2017     Physical Exam  Triage Vital Signs: ED Triage Vitals  Enc Vitals Group     BP 03/01/22 2103 133/85     Pulse Rate 03/01/22 2103 83     Resp 03/01/22 2103 20     Temp 03/01/22 2103 99 F (37.2 C)     Temp Source 03/01/22 2103 Oral     SpO2 03/01/22 2103 95 %     Weight 03/01/22 2109 274 lb (124.3 kg)     Height 03/01/22 2109 6' (1.829 m)     Head Circumference --      Peak Flow --      Pain Score 03/01/22 2103 7     Pain Loc --      Pain Edu? --      Excl. in [place]? --     Most recent vital signs: Vitals:   03/01/22 2103  BP: 133/85  Pulse: 83  Resp: 20  Temp: 99 F (37.2 C)  SpO2: 95%     General: Awake, no distress.  CV:  Good peripheral perfusion.  No edema or asymmetry Resp:  Normal effort.  Lungs are clear Abd:  No distention.  Soft nontender throughout Neuro:  Awake, Alert, Oriented x 3  Other:     ED Results / Procedures / Treatments  Labs (all labs ordered are listed, but only abnormal results are displayed) Labs Reviewed  BASIC METABOLIC PANEL - Abnormal; Notable for the following components:      Result Value   Glucose, Bld 110 (*)    All other components within normal limits  CBC - Abnormal; Notable for the following components:   WBC 14.1 (*)    All other components within normal limits  TROPONIN I (HIGH SENSITIVITY)  TROPONIN I (HIGH SENSITIVITY)     EKG  EKG interpretation performed by myself: NSR, nml axis, nml intervals, no acute ischemic changes    RADIOLOGY I reviewed and interpreted the CXR which does not show any acute cardiopulmonary process    PROCEDURES:  Critical Care performed: No  Procedures   MEDICATIONS ORDERED IN ED: Medications  alum & mag hydroxide-simeth (MAALOX/MYLANTA) 200-200-20 MG/5ML suspension 30 mL (30 mLs Oral Given 03/02/22 0134)     IMPRESSION / MDM / ASSESSMENT AND PLAN / ED COURSE  I reviewed the triage  vital signs and the nursing notes.                              Patient's presentation is most consistent with acute presentation with potential threat to life or bodily function.  Differential diagnosis includes, but is not limited to, anxiety, illness anxiety disorder, acid reflux/esophagitis/gastritis, less likely acute coronary syndrome, pulmonary embolism  The patient is a 26 year old male with multiple recent ED visits for chest pain who presents with similar today.  The episode started tonight around 7 PM and is now resolved he has no chest pain currently.  He apparently saw cardiology yesterday is going to have an echo and stress test but was told that this is unlikely to be cardiac.  Also has GI appointment coming up.  Has had multiple stressors recently with his job this month.  Does not have medical insurance is not taking PPI or Carafate because he both cannot take pills and he does not have insurance to afford it.  Today vitals are within normal limits his exam is unremarkable troponins x2 are negative EKG is nonischemic chest x-ray also unremarkable.  Does have mild leukocytosis which has had in the past.  Had a long discussion with him about the fact that this is very unlikely to be heart related that is good that he is seeing cardiology to have the full work-up.  We discussed possible other etiologies including GI related and anxiety/illness anxiety disorder.  Patient was given GI cocktail.  I encouraged him to take PPI that he has been prescribed and to keep his follow-up appointments.  Strongly encouraged him to follow-up with [facility] as I think there is largely a supratentorial component to his symptoms.  He is not suicidal no indication for IVC today.  He is appropriate for discharge.      FINAL CLINICAL IMPRESSION(S) / ED DIAGNOSES   Final diagnoses:  Nonspecific chest pain     Rx / DC Orders   ED Discharge Orders     None        Note:  This document was prepared using  Dragon voice recognition software and may include unintentional dictation errors.   Rada Hay, MD 03/02/22 [phone]

## 2022-03-12 ENCOUNTER — Other Ambulatory Visit: Payer: Self-pay

## 2022-03-12 ENCOUNTER — Encounter: Payer: Self-pay | Admitting: Emergency Medicine

## 2022-03-12 ENCOUNTER — Emergency Department: Payer: No Typology Code available for payment source

## 2022-03-12 DIAGNOSIS — R0789 Other chest pain: Secondary | ICD-10-CM | POA: Diagnosis not present

## 2022-03-12 DIAGNOSIS — R079 Chest pain, unspecified: Secondary | ICD-10-CM | POA: Diagnosis present

## 2022-03-12 DIAGNOSIS — R0602 Shortness of breath: Secondary | ICD-10-CM | POA: Diagnosis not present

## 2022-03-12 DIAGNOSIS — R001 Bradycardia, unspecified: Secondary | ICD-10-CM | POA: Diagnosis not present

## 2022-03-12 LAB — BASIC METABOLIC PANEL
Anion gap: 7 (ref 5–15)
BUN: 9 mg/dL (ref 6–20)
CO2: 26 mmol/L (ref 22–32)
Calcium: 9.4 mg/dL (ref 8.9–10.3)
Chloride: 107 mmol/L (ref 98–111)
Creatinine, Ser: 1.07 mg/dL (ref 0.61–1.24)
GFR, Estimated: >60 mL/min (ref >60)
Glucose, Bld: 153 mg/dL — ABNORMAL HIGH (ref 70–99)
Potassium: 3.6 mmol/L (ref 3.5–5.1)
Sodium: 140 mmol/L (ref 135–145)

## 2022-03-12 LAB — CBC
HCT: 45.1 % (ref 39.0–52.0)
Hemoglobin: 15.1 g/dL (ref 13.0–17.0)
MCH: 29.4 pg (ref 26.0–34.0)
MCHC: 33.5 g/dL (ref 30.0–36.0)
MCV: 87.7 fL (ref 80.0–100.0)
Platelets: 254 10*3/uL (ref 150–400)
RBC: 5.14 MIL/uL (ref 4.22–5.81)
RDW: 12.6 % (ref 11.5–15.5)
WBC: 9.2 10*3/uL (ref 4.0–10.5)
nRBC: 0 % (ref 0.0–0.2)

## 2022-03-12 LAB — TROPONIN I (HIGH SENSITIVITY): Troponin I (High Sensitivity): 4 ng/L (ref <18)

## 2022-03-12 NOTE — ED Triage Notes (Signed)
Pt to ED via [facility] with c/o L sided CP. Pt seen multiples times for same. Pt states today pain became worse after eating dinner. Pt alert and oriented, presentation same as previous encounters with patient.   Per EMS VSS en route, 12 lead unremarkable.

## 2022-03-13 ENCOUNTER — Other Ambulatory Visit: Payer: Self-pay

## 2022-03-13 ENCOUNTER — Emergency Department
Admission: EM | Admit: 2022-03-13 | Discharge: 2022-03-13 | Disposition: A | Discharge status: Home / Self Care | Payer: No Typology Code available for payment source | Source: Home / Self Care | Attending: Emergency Medicine | Admitting: Emergency Medicine

## 2022-03-13 ENCOUNTER — Emergency Department
Admission: EM | Admit: 2022-03-13 | Discharge: 2022-03-13 | Disposition: A | Discharge status: Home / Self Care | Payer: No Typology Code available for payment source | Attending: Emergency Medicine | Admitting: Emergency Medicine

## 2022-03-13 DIAGNOSIS — R0789 Other chest pain: Secondary | ICD-10-CM

## 2022-03-13 DIAGNOSIS — R001 Bradycardia, unspecified: Secondary | ICD-10-CM | POA: Insufficient documentation

## 2022-03-13 LAB — D-DIMER, QUANTITATIVE: D-Dimer, Quant: <0.27 ug/mL-FEU (ref 0.00–0.50)

## 2022-03-13 LAB — COMPREHENSIVE METABOLIC PANEL
ALT: 45 U/L — ABNORMAL HIGH (ref 0–44)
AST: 25 U/L (ref 15–41)
Albumin: 4.3 g/dL (ref 3.5–5.0)
Alkaline Phosphatase: 62 U/L (ref 38–126)
Anion gap: 7 (ref 5–15)
BUN: 12 mg/dL (ref 6–20)
CO2: 25 mmol/L (ref 22–32)
Calcium: 9.5 mg/dL (ref 8.9–10.3)
Chloride: 110 mmol/L (ref 98–111)
Creatinine, Ser: 0.97 mg/dL (ref 0.61–1.24)
GFR, Estimated: >60 mL/min (ref >60)
Glucose, Bld: 94 mg/dL (ref 70–99)
Potassium: 4.1 mmol/L (ref 3.5–5.1)
Sodium: 142 mmol/L (ref 135–145)
Total Bilirubin: 0.6 mg/dL (ref 0.3–1.2)
Total Protein: 7.5 g/dL (ref 6.5–8.1)

## 2022-03-13 LAB — CBC WITH DIFFERENTIAL/PLATELET
Abs Immature Granulocytes: 0.02 10*3/uL (ref 0.00–0.07)
Basophils Absolute: 0 10*3/uL (ref 0.0–0.1)
Basophils Relative: 0 %
Eosinophils Absolute: 0.1 10*3/uL (ref 0.0–0.5)
Eosinophils Relative: 1 %
HCT: 43.4 % (ref 39.0–52.0)
Hemoglobin: 14.4 g/dL (ref 13.0–17.0)
Immature Granulocytes: 0 %
Lymphocytes Relative: 33 %
Lymphs Abs: 3 10*3/uL (ref 0.7–4.0)
MCH: 29.4 pg (ref 26.0–34.0)
MCHC: 33.2 g/dL (ref 30.0–36.0)
MCV: 88.6 fL (ref 80.0–100.0)
Monocytes Absolute: 1 10*3/uL (ref 0.1–1.0)
Monocytes Relative: 11 %
Neutro Abs: 4.8 10*3/uL (ref 1.7–7.7)
Neutrophils Relative %: 55 %
Platelets: 227 10*3/uL (ref 150–400)
RBC: 4.9 MIL/uL (ref 4.22–5.81)
RDW: 12.6 % (ref 11.5–15.5)
WBC: 8.9 10*3/uL (ref 4.0–10.5)
nRBC: 0 % (ref 0.0–0.2)

## 2022-03-13 LAB — LIPASE, BLOOD: Lipase: 31 U/L (ref 11–51)

## 2022-03-13 LAB — TROPONIN I (HIGH SENSITIVITY)
Troponin I (High Sensitivity): 4 ng/L (ref <18)
Troponin I (High Sensitivity): 4 ng/L (ref <18)

## 2022-03-13 MED ORDER — HYDROXYZINE HCL 25 MG PO TABS
25.0000 mg | ORAL_TABLET | Freq: Once | ORAL | Status: DC
Start: 2022-03-13 — End: 2022-03-13

## 2022-03-13 MED ORDER — LIDOCAINE VISCOUS HCL 2 % MT SOLN
15.0000 mL | Freq: Once | OROMUCOSAL | Status: AC
  Administered 2022-03-13: 15 mL via OROMUCOSAL
  Filled 2022-03-13: qty 15

## 2022-03-13 MED ORDER — ALUM & MAG HYDROXIDE-SIMETH 200-200-20 MG/5ML PO SUSP
30.0000 mL | Freq: Once | ORAL | Status: AC
  Administered 2022-03-13: 30 mL via ORAL
  Filled 2022-03-13: qty 30

## 2022-03-13 MED ORDER — KETOROLAC TROMETHAMINE 30 MG/ML IJ SOLN
15.0000 mg | Freq: Once | INTRAMUSCULAR | Status: AC
  Administered 2022-03-13: 15 mg via INTRAVENOUS
  Filled 2022-03-13: qty 1

## 2022-03-13 MED ORDER — FAMOTIDINE 40 MG/5ML PO SUSR
40.0000 mg | Freq: Once | ORAL | Status: AC
  Administered 2022-03-13: 40 mg via ORAL
  Filled 2022-03-13: qty 5

## 2022-03-13 MED ORDER — LORAZEPAM 0.5 MG PO TABS
0.5000 mg | ORAL_TABLET | Freq: Once | ORAL | Status: AC
  Administered 2022-03-13: 0.5 mg via ORAL
  Filled 2022-03-13: qty 1

## 2022-03-13 MED ORDER — HYDROXYZINE HCL 10 MG/5ML PO SYRP
50.0000 mg | ORAL_SOLUTION | Freq: Once | ORAL | Status: AC
Start: 2022-03-13 — End: 2022-03-13
  Administered 2022-03-13: 50 mg via ORAL
  Filled 2022-03-13: qty 25

## 2022-03-13 NOTE — ED Provider Notes (Signed)
[facility] Provider Note  Patient Contact: 3:52 PM (approximate)   History   Chest Pain   HPI  Timothy [name] is a 27 y.o. male with a history of chest pain, anxiety, depression and GERD, presents to the emergency department after being seen and evaluated for chest pain yesterday.  Patient states that he is very pleased with the care he received last night and felt that his physician was very supportive and caring.  He states that he had some residual chest discomfort that he feels is from acid reflux that has not gone away and became concerned.  He states this chest pain is typical for him and he has not developed any new symptoms this morning.  He states that he occasionally has chest tightness and shortness of breath but none right now.  He also states that he occasionally has headaches but does not currently have a headache.  Patient would like to be observed in the emergency department until 6:00 as he states this is when his typical chest pain occurs.      Physical Exam   Triage Vital Signs: ED Triage Vitals  Enc Vitals Group     BP 03/13/22 1247 120/70     Pulse Rate 03/13/22 1247 73     Resp 03/13/22 1247 18     Temp 03/13/22 1247 98.9 F (37.2 C)     Temp Source 03/13/22 1247 Oral     SpO2 03/13/22 1247 96 %     Weight --      Height --      Head Circumference --      Peak Flow --      Pain Score 03/13/22 1245 4     Pain Loc --      Pain Edu? --      Excl. in [place]? --     Most recent vital signs: Vitals:   03/13/22 1853 03/13/22 1902  BP: 120/85 120/85  Pulse: 66 68  Resp: 18 18  Temp:    SpO2:  98%     General: Alert and in no acute distress. Eyes:  PERRL. EOMI. Head: No acute traumatic findings ENT:      Nose: No congestion/rhinnorhea.      Mouth/Throat: Mucous membranes are moist. Neck: No stridor. No cervical spine tenderness to palpation. Cardiovascular:  Good peripheral perfusion Respiratory: Normal respiratory effort  without tachypnea or retractions. Lungs CTAB. Good air entry to the bases with no decreased or absent breath sounds. Gastrointestinal: Bowel sounds 4 quadrants. Soft and nontender to palpation. No guarding or rigidity. No palpable masses. No distention. No CVA tenderness. Musculoskeletal: Full range of motion to all extremities.  Neurologic:  No gross focal neurologic deficits are appreciated.  Skin:   No rash noted Other:   ED Results / Procedures / Treatments   Labs (all labs ordered are listed, but only abnormal results are displayed) Labs Reviewed  COMPREHENSIVE METABOLIC PANEL - Abnormal; Notable for the following components:      Result Value   ALT 45 (*)    All other components within normal limits  CBC WITH DIFFERENTIAL/PLATELET  LIPASE, BLOOD  TROPONIN I (HIGH SENSITIVITY)  TROPONIN I (HIGH SENSITIVITY)     EKG  Sinus bradycardia with arrhythmia but no ST segment elevation or other apparent arrhythmia.   RADIOLOGY     PROCEDURES:  Critical Care performed: No  Procedures   MEDICATIONS ORDERED IN ED: Medications  famotidine (PEPCID) 40 MG/5ML suspension 40  mg (40 mg Oral Given 03/13/22 1622)  alum & mag hydroxide-simeth (MAALOX/MYLANTA) 200-200-20 MG/5ML suspension 30 mL (30 mLs Oral Given 03/13/22 1621)  hydrOXYzine (ATARAX) 10 MG/5ML syrup 50 mg (50 mg Oral Given 03/13/22 1621)  LORazepam (ATIVAN) tablet 0.5 mg (0.5 mg Oral Given 03/13/22 1852)     IMPRESSION / MDM / ASSESSMENT AND PLAN / ED COURSE  I reviewed the triage vital signs and the nursing notes.                              Assessment and plan Chest pain 26 year old male presents to the emergency department with epigastric abdominal discomfort that radiates to the chest.  Vital signs are reassuring at triage.  On exam, patient was alert, active and nontoxic-appearing.  I reviewed patient's work-up from last night which include a reassuring EKG, D-dimer, delta troponin and comprehensive  labs.  Patient expressed concern about his blood pressure being low yesterday and I reviewed his blood pressure at triage which was within reference range.  We agreed to conduct an EKG during this emergency department encounter and to administer liquid Pepcid as patient cannot take pills and repeat GI cocktail as I do suspect that patient has acid reflux as a source of his discomfort.  Patient also requested medicine to help him sleep and I ordered Atarax suspension for him.   ----------------------------------------- 6:40 PM on 03/13/2022 ----------------------------------------- I went in to check on the patient after he requested to be observed until 6:00 PM.  Patient reports that his chest pain is worsened and is now radiating into his left hand.  I will repeat basic labs and troponin and will administer half milligram of Ativan and will reassess.  CBC, CMP and troponin reassuring.  EKG indicated sinus bradycardia with arrhythmia but no ST segment elevation or other apparent arrhythmia.  I did recommend follow-up with cardiology.  Return precautions were given to return with new or worsening symptoms.   FINAL CLINICAL IMPRESSION(S) / ED DIAGNOSES   Final diagnoses:  Other chest pain     Rx / DC Orders   ED Discharge Orders     None        Note:  This document was prepared using Dragon voice recognition software and may include unintentional dictation errors.   Vallarie Mare [place], PA-C 03/13/22 2015    Duffy Bruce, MD 03/21/22 [phone]

## 2022-03-13 NOTE — ED Provider Notes (Signed)
[facility] Provider Note    Event Date/Time   First MD Initiated Contact with Patient 03/13/22 0100     (approximate)   History   Chest Pain   HPI  Timothy [name] is a 26 y.o. male who presents to the ED for evaluation of Chest Pain   I reviewed recurrent ED visits for chest pain.  Mostly in our facility, but was seen at [facility] 8 days ago for the same.  10 visits in the past 2 months.  Patient presents to the ED for evaluation of another episode of chest pain and shortness of breath.  Reports symptoms started shortly after eating this evening and have been present since about 5:30 PM.  Reports symptoms of been constant since that timeframe, seem worse with exertion.  Reports persistent "poking" sensation to his substernal chest by the time I evaluate him.  He reports being evaluated by a cardiologist this past Monday, Dr. Humphrey Rolls.  Reports a stress test was ordered despite reassurance from the cardiologist that this was unlikely to be from his heart.  Patient reports he may not do the stress test because it might cost him $1000.   Physical Exam   Triage Vital Signs: ED Triage Vitals  Enc Vitals Group     BP 03/12/22 2255 108/73     Pulse Rate 03/12/22 2255 88     Resp 03/12/22 2255 17     Temp 03/12/22 2255 98.8 F (37.1 C)     Temp Source 03/12/22 2255 Oral     SpO2 03/12/22 2255 97 %     Weight 03/12/22 2247 274 lb (124.3 kg)     Height 03/12/22 2247 6' (1.829 m)     Head Circumference --      Peak Flow --      Pain Score 03/12/22 2247 10     Pain Loc --      Pain Edu? --      Excl. in [place]? --     Most recent vital signs: Vitals:   03/13/22 0103 03/13/22 0130  BP: 114/60 108/60  Pulse: 76 60  Resp: 12 14  Temp:    SpO2: 96% 98%    General: Awake, no distress.  CV:  Good peripheral perfusion. RRR Resp:  Normal effort. CTAB Abd:  No distention.  MSK:  No deformity noted.  Neuro:  No focal deficits appreciated. Other:     ED  Results / Procedures / Treatments   Labs (all labs ordered are listed, but only abnormal results are displayed) Labs Reviewed  BASIC METABOLIC PANEL - Abnormal; Notable for the following components:      Result Value   Glucose, Bld 153 (*)    All other components within normal limits  CBC  D-DIMER, QUANTITATIVE  TROPONIN I (HIGH SENSITIVITY)  TROPONIN I (HIGH SENSITIVITY)    EKG Sinus rhythm with a rate of 95 bpm.  Normal axis and intervals.  Nonspecific ST changes to inferior and lateral leads without STEMI. Similar morphology to EKG from 12 days ago.  RADIOLOGY CXR interpreted by me without evidence of acute cardiopulmonary pathology.  Official radiology report(s): DG Chest 2 View  Result Date: 03/12/2022 CLINICAL DATA:  Chest pain.  Left-sided weakness. EXAM: CHEST - 2 VIEW COMPARISON:  Chest radiograph 03/01/2022. FINDINGS: Stable lung volumes.The cardiomediastinal contours are normal. The lungs are clear. Pulmonary vasculature is normal. No consolidation, pleural effusion, or pneumothorax. No acute osseous abnormalities are seen. IMPRESSION: Negative radiographs  of the chest. Electronically Signed   By: Keith Rake M.D.   On: 03/12/2022 23:26    PROCEDURES and INTERVENTIONS:  .1-3 Lead EKG Interpretation  Performed by: Vladimir Crofts, MD Authorized by: Vladimir Crofts, MD     Interpretation: normal     ECG rate:  62   ECG rate assessment: normal     Rhythm: sinus rhythm     Ectopy: none     Conduction: normal     Medications  alum & mag hydroxide-simeth (MAALOX/MYLANTA) 200-200-20 MG/5ML suspension 30 mL (30 mLs Oral Given 03/13/22 0127)  lidocaine (XYLOCAINE) 2 % viscous mouth solution 15 mL (15 mLs Mouth/Throat Given 03/13/22 0127)  ketorolac (TORADOL) 30 MG/ML injection 15 mg (15 mg Intravenous Given 03/13/22 0127)     IMPRESSION / MDM / ASSESSMENT AND PLAN / ED COURSE  I reviewed the triage vital signs and the nursing notes.  Differential diagnosis includes,  but is not limited to, ACS, PTX, PNA, muscle strain/spasm, PE, dissection, anxiety  {Patient presents with symptoms of an acute illness or injury that is potentially life-threatening.  26 year old male presents to the ED with acute on chronic chest pain without evidence of acute pathology and suitable for outpatient management.  Look systemically well.  Reassuring work-up with nonischemic EKG, negative troponins, negative D-dimer, normal CBC and metabolic panel.  CXR is clear.  I considered observation admission, but ultimately we will discharge home.  We discussed return precautions and following up with his cardiologist.  Clinical Course as of 03/13/22 0338  Sun Mar 13, 2022  0155 Reassessed.  Patient reports feeling better.  We discussed reassuring work-up and appropriate return precautions for the ED. [DS]    Clinical Course User Index [DS] Vladimir Crofts, MD     FINAL CLINICAL IMPRESSION(S) / ED DIAGNOSES   Final diagnoses:  Other chest pain     Rx / DC Orders   ED Discharge Orders     None        Note:  This document was prepared using Dragon voice recognition software and may include unintentional dictation errors.   Vladimir Crofts, MD 03/13/22 [phone]

## 2022-03-13 NOTE — ED Triage Notes (Signed)
Pt comes with c/o cp. Pt states he doesn't feel any better. Pt states he was just seen here in ED for same. Pt stats he was dc went home and vomited. Pt states he needs to check in again.

## 2022-03-13 NOTE — Discharge Instructions (Signed)
You can follow-up with Dr. Nehemiah Massed in the office.

## 2022-03-13 NOTE — ED Provider Triage Note (Signed)
Emergency Medicine Provider Triage Evaluation Note  Timothy [name] , a 26 y.o. male  was evaluated in triage.  Pt complains of chest pain and vomiting after eating.  States it feels like it is in the center of his chest.  Has a appointment with GI later this week and an appointment with psych in August..  Review of Systems  Positive:  Negative:   Physical Exam  BP 120/70 (BP Location: Right Arm)   Pulse 73   Temp 98.9 F (37.2 C) (Oral)   Resp 18   SpO2 96%  Gen:   Awake, no distress   Resp:  Normal effort  MSK:   Moves extremities without difficulty  Other:    Medical Decision Making  Medically screening exam initiated at 12:57 PM.  Appropriate orders placed.  Timothy T Depoy was informed that the remainder of the evaluation will be completed by another provider, this initial triage assessment does not replace that evaluation, and the importance of remaining in the ED until their evaluation is complete.  Patient was just discharged earlier and all of his labs were normal even second troponins were done.  Patient is not worse than he was.  It appears to be more anxiety related and most likely GI.   Versie Starks, PA-C 03/13/22 1258

## 2022-03-14 ENCOUNTER — Encounter: Payer: Self-pay | Admitting: Gastroenterology

## 2022-03-14 ENCOUNTER — Encounter: Payer: Self-pay | Admitting: *Deleted

## 2022-03-14 ENCOUNTER — Other Ambulatory Visit: Payer: Self-pay

## 2022-03-14 ENCOUNTER — Ambulatory Visit (INDEPENDENT_AMBULATORY_CARE_PROVIDER_SITE_OTHER): Payer: No Typology Code available for payment source | Admitting: Gastroenterology

## 2022-03-14 VITALS — BP 105/72 | HR 96 | Temp 99.2°F | Ht 72.0 in | Wt 267.0 lb

## 2022-03-14 DIAGNOSIS — Z86010 Personal history of colonic polyps: Secondary | ICD-10-CM

## 2022-03-14 DIAGNOSIS — R1084 Generalized abdominal pain: Secondary | ICD-10-CM | POA: Diagnosis not present

## 2022-03-14 DIAGNOSIS — R0789 Other chest pain: Secondary | ICD-10-CM

## 2022-03-14 DIAGNOSIS — K5909 Other constipation: Secondary | ICD-10-CM | POA: Diagnosis not present

## 2022-03-14 DIAGNOSIS — K219 Gastro-esophageal reflux disease without esophagitis: Secondary | ICD-10-CM

## 2022-03-14 LAB — BASIC METABOLIC PANEL
Anion gap: 8 (ref 5–15)
BUN: 14 mg/dL (ref 6–20)
CO2: 25 mmol/L (ref 22–32)
Calcium: 9.3 mg/dL (ref 8.9–10.3)
Chloride: 108 mmol/L (ref 98–111)
Creatinine, Ser: 1.13 mg/dL (ref 0.61–1.24)
GFR, Estimated: >60 mL/min (ref >60)
Glucose, Bld: 88 mg/dL (ref 70–99)
Potassium: 4.1 mmol/L (ref 3.5–5.1)
Sodium: 141 mmol/L (ref 135–145)

## 2022-03-14 LAB — CBC
HCT: 44.6 % (ref 39.0–52.0)
Hemoglobin: 14.8 g/dL (ref 13.0–17.0)
MCH: 29.4 pg (ref 26.0–34.0)
MCHC: 33.2 g/dL (ref 30.0–36.0)
MCV: 88.7 fL (ref 80.0–100.0)
Platelets: 240 10*3/uL (ref 150–400)
RBC: 5.03 MIL/uL (ref 4.22–5.81)
RDW: 12.6 % (ref 11.5–15.5)
WBC: 9.4 10*3/uL (ref 4.0–10.5)
nRBC: 0 % (ref 0.0–0.2)

## 2022-03-14 LAB — TROPONIN I (HIGH SENSITIVITY)
Troponin I (High Sensitivity): 4 ng/L (ref <18)
Troponin I (High Sensitivity): 4 ng/L (ref <18)

## 2022-03-14 NOTE — Patient Instructions (Signed)
Dr. Rogue Jury [place]--Cardiology [facility] [address] [place], [place] [address] [phone]

## 2022-03-14 NOTE — ED Notes (Signed)
Pt asking to be test diabetes. When asked when symptoms he was having. Pt stated, "I always feel hungry and have these spots on my feet." Pt informed that all this tech could do was make a note and it was up to provider if they ordered further testing.

## 2022-03-14 NOTE — ED Notes (Signed)
Pt stated to this tech, "I am going to keep coming back here when I feel like I am dying. It is up to yall to figure out what is going on with me. The cardiologist refuses to see me because I do not have $1050 to pay them up front. I saw GI and they told me they would do everything but the anesthesiologist will refuse me until I see the cardiologist." Pt informed that we are doing all we can. Pt stated, "I keep coming back and yall are not doing anything for me."

## 2022-03-14 NOTE — ED Triage Notes (Addendum)
Pt reports chest pain  pt was seen here twice for similar sx.  Pt has n/v.   Pt alert   speech clear.  Pt reports feeling stressed out.

## 2022-03-14 NOTE — Progress Notes (Signed)
[name] MD, MRCP(U.K) [address]  [place]  [place], [place] [address]  Main: [phone]  Fax: [phone]   Gastroenterology Consultation  Referring Provider:     Jearld Fenton, NP Primary Care Physician:  Jearld Fenton, NP Primary Gastroenterologist:  Dr. [name]  Reason for Consultation:     Colonoscopy        HPI:   Timothy [name] is a 26 y.o. y/o male referred for consultation & management  by Jearld Fenton, NP.    Since his last visit he has had a few visits to the ED ER for nonspecific chest pain the last visit being yesterday thinks it might be from acid reflux given a GI cocktail.  Similar visit on 03/05/2022 where he was given PPI and discharged.  CT scan on 02/13/2021 showed a partial colonic resection in the sigmoid colon small hernia.  He has had a history of diverticulitis in 9983 complicated by perforation at sigmoid resection.  11/01/2018: 15 mm polyp was resected in the transverse colon.  The polyp was a tubular adenoma.  He is due for surveillance colonoscopy.  He has been having episodes of chest pain feels like a pressure sometimes radiating to his lower abdomen sometimes radiating to his neck sometimes short of breath sometimes sharp in nature.  He has been advised to see a cardiologist has seen Dr. Humphrey Rolls but does not have the money to proceed with a stress test.  It is pending.  He has was some constipation but cannot quantify how often he has a bowel movement tried MiraLAX to low-dose which has not really helped.  He says he cannot swallow pills for acid reflux he wants a liquid.  Past Medical History:  Diagnosis Date   Allergy    Depression    Diverticulitis    Dyspnea    Fainting spell    in middle school   Family history of bone cancer    Family history of breast cancer     Past Surgical History:  Procedure Laterality Date   COLECTOMY WITH COLOSTOMY CREATION/HARTMANN PROCEDURE N/A 07/22/2018   Procedure: COLECTOMY WITH COLOSTOMY  CREATION/HARTMANN PROCEDURE;  Surgeon: Fredirick Maudlin, MD;  Location: [facility] ORS;  Service: General;  Laterality: N/A;   COLONOSCOPY WITH PROPOFOL N/A 11/01/2018   Procedure: COLONOSCOPY WITH PROPOFOL;  Surgeon: [name], MD;  Location: [facility];  Service: Gastroenterology;  Laterality: N/A;   COLOSTOMY REVERSAL N/A 03/22/2019   Procedure: COLOSTOMY REVERSAL;  Surgeon: Fredirick Maudlin, MD;  Location: [facility] ORS;  Service: General;  Laterality: N/A;    Prior to Admission medications   Medication Sig Start Date End Date Taking? Authorizing Provider  hydrOXYzine (ATARAX) 10 MG/5ML syrup Take 12.5 mLs (25 mg total) by mouth 3 (three) times daily as needed for up to 20 doses for anxiety. 01/25/22   Jearld Fenton, NP  lidocaine (XYLOCAINE) 2 % solution Use as directed 15 mLs in the mouth or throat as needed for mouth pain. 02/20/22   Blake Divine, MD  pantoprazole sodium (PROTONIX) 40 mg Take 40 mg by mouth daily. 02/24/22   Paulette Blanch, MD  rizatriptan (MAXALT-MLT) 5 MG disintegrating tablet Take 1 tablet (5 mg total) by mouth as needed for migraine. May repeat in 2 hours if needed 02/18/22   Jearld Fenton, NP  sucralfate (CARAFATE) 1 GM/10ML suspension Take 10 mLs (1 g total) by mouth 4 (four) times daily. 02/24/22   Paulette Blanch, MD  Family History  Problem Relation Age of Onset   Heart disease Maternal Grandmother    Diabetes Maternal Grandmother    Cancer Maternal Grandmother        breast cancer   Alport syndrome Maternal Grandmother    Cancer Paternal Grandfather        Bone cancer   Healthy Sister    Alport syndrome Maternal Aunt    Depression Mother    Cervical cancer Mother      Social History   Tobacco Use   Smoking status: Former    Packs/day: 0.25    Types: Cigarettes   Smokeless tobacco: Current    Types: Snuff   Tobacco comments:    pt smoke pack a week  Vaping Use   Vaping Use: Every day   Last attempt to quit: 09/09/2019   Substances: Nicotine,  Flavoring  Substance Use Topics   Alcohol use: Yes    Alcohol/week: 6.0 standard drinks of alcohol    Types: 6 Cans of beer per week    Comment: occassionally   Drug use: No    Allergies as of 03/14/2022   (No Known Allergies)    Review of Systems:    All systems reviewed and negative except where noted in HPI.   Physical Exam:  There were no vitals taken for this visit. No LMP for male patient. Psych:  Alert and cooperative. Normal mood and affect. General:   Alert,  Well-developed, well-nourished, pleasant and cooperative in NAD Head:  Normocephalic and atraumatic. Eyes:  Sclera clear, no icterus.   Conjunctiva pink. Ears:  Normal auditory acuity. Neck:  Supple; no masses or thyromegaly. Lungs:  Respirations even and unlabored.  Clear throughout to auscultation.   No wheezes, crackles, or rhonchi. No acute distress. Heart:  Regular rate and rhythm; no murmurs, clicks, rubs, or gallops. Abdomen:  Normal bowel sounds.  No bruits.  Soft, non-tender and non-distended without masses, hepatosplenomegaly or hernias noted.  No guarding or rebound tenderness.    Neurologic:  Alert and oriented x3;  grossly normal neurologically. Psych:  Alert and cooperative. Normal mood and affect.  Imaging Studies: DG Chest 2 View  Result Date: 03/12/2022 CLINICAL DATA:  Chest pain.  Left-sided weakness. EXAM: CHEST - 2 VIEW COMPARISON:  Chest radiograph 03/01/2022. FINDINGS: Stable lung volumes.The cardiomediastinal contours are normal. The lungs are clear. Pulmonary vasculature is normal. No consolidation, pleural effusion, or pneumothorax. No acute osseous abnormalities are seen. IMPRESSION: Negative radiographs of the chest. Electronically Signed   By: Keith Rake M.D.   On: 03/12/2022 23:26   DG Chest 2 View  Result Date: 03/01/2022 CLINICAL DATA:  Chest pain EXAM: CHEST - 2 VIEW COMPARISON:  02/26/2022 FINDINGS: The heart size and mediastinal contours are within normal limits. Both lungs  are clear. The visualized skeletal structures are unremarkable. IMPRESSION: No active cardiopulmonary disease. Electronically Signed   By: Inez Catalina M.D.   On: 03/01/2022 21:29   DG Chest Port 1 View  Result Date: 02/26/2022 CLINICAL DATA:  Recurrent chest pain. EXAM: PORTABLE CHEST 1 VIEW COMPARISON:  Radiographs 02/23/2022 and 02/22/2022. FINDINGS: 1039 hours. The heart size and mediastinal contours are normal. The lungs are clear. There is no pleural effusion or pneumothorax. No acute osseous findings are identified. Telemetry leads overlie the chest. IMPRESSION: Stable chest.  No evidence of active cardiopulmonary process. Electronically Signed   By: Richardean Sale M.D.   On: 02/26/2022 10:48   DG Chest 1 View  Result Date: 02/23/2022 CLINICAL  DATA:  Chest pain, shortness of breath, and anxiety. EXAM: CHEST  1 VIEW COMPARISON:  02/22/2022 FINDINGS: The heart size and mediastinal contours are within normal limits. Both lungs are clear. The visualized skeletal structures are unremarkable. IMPRESSION: No active disease. Electronically Signed   By: Lucienne Capers M.D.   On: 02/23/2022 20:49   DG Chest 2 View  Result Date: 02/22/2022 CLINICAL DATA:  Chest pain. EXAM: CHEST - 2 VIEW COMPARISON:  02/20/2022 FINDINGS: The cardiomediastinal contours are normal. The lungs are clear. Pulmonary vasculature is normal. No consolidation, pleural effusion, or pneumothorax. No acute osseous abnormalities are seen. IMPRESSION: Negative radiographs of the chest. Electronically Signed   By: Keith Rake M.D.   On: 02/22/2022 20:01   DG Chest 2 View  Result Date: 02/20/2022 CLINICAL DATA:  Chest pain EXAM: CHEST - 2 VIEW COMPARISON:  02/05/2022 FINDINGS: The lungs are clear without focal pneumonia, edema, pneumothorax or pleural effusion. The cardiopericardial silhouette is within normal limits for size. The visualized bony structures of the thorax are unremarkable. IMPRESSION: No active cardiopulmonary  disease. Electronically Signed   By: Misty Stanley M.D.   On: 02/20/2022 05:54   CT Head Wo Contrast  Result Date: 02/15/2022 CLINICAL DATA:  Severe headache. Weakness and dizziness for the past month. EXAM: CT HEAD WITHOUT CONTRAST TECHNIQUE: Contiguous axial images were obtained from the base of the skull through the vertex without intravenous contrast. RADIATION DOSE REDUCTION: This exam was performed according to the departmental dose-optimization program which includes automated exposure control, adjustment of the mA and/or kV according to patient size and/or use of iterative reconstruction technique. COMPARISON:  CT head dated August 10, 2014. FINDINGS: Brain: No evidence of acute infarction, hemorrhage, hydrocephalus, extra-axial collection or mass lesion/mass effect. Vascular: No hyperdense vessel or unexpected calcification. Skull: Normal. Negative for fracture or focal lesion. Sinuses/Orbits: No acute finding. Other: None. IMPRESSION: 1. No acute intracranial abnormality. Electronically Signed   By: Titus Dubin M.D.   On: 02/15/2022 11:55    Assessment and Plan:   Timothy [name] is a 26 y.o. y/o male has been referred for chest pain- some features of reflux but his history is not clear- he has been suggested to see a cardiologist but says he doesn't have the money . H/o prior colon polyps . Does likely have some constipation too. Has not taken any ppi since he says he cannot swallow pills.   Plan 1.  Advised him to find out and which pharmacy you would like me to send a prescription of Prilosec liquid 40 mg twice daily which would be the cheapest and I will send it to him. 2.  Commence on MiraLAX OTC 1 capful 3 times a day.  Lactulose not an option as he has to pay cash and cannot afford it at this point of time. 3.  He would require an EGD and colonoscopy to evaluate for his upper GI symptoms and for colon cancer surveillance due to prior history of a colon polyp but we cannot do so at  this point of time as he has had multiple episodes of chest pain as grandmother had significant coronary artery disease and his description of the chest pain has some features of cardiac pain and some features of GI pain.  Once he has cardiac evaluation I will be happy to proceed with his endoscopy procedures.  He will contact us after his stress test.  Follow up in as needed.  Dr [name] MD,MRCP(U.K)

## 2022-03-14 NOTE — ED Provider Triage Note (Signed)
  Emergency Medicine Provider Triage Evaluation Note  Timothy [name] , a 26 y.o.male,  was evaluated in triage.  Pt complains of chest pain, shortness of breath, palpitations.  He has been seen here twice recently for similar symptoms.  Endorses extreme amount of stress recently due to work events and recent dropping from [employer].   Review of Systems  Positive: Chest pain, palpitations Negative: Denies fever, chest pain, vomiting  Physical Exam  There were no vitals filed for this visit. Gen:   Awake, no distress   Resp:  Normal effort  MSK:   Moves extremities without difficulty  Other:    Medical Decision Making  Given the patient's initial medical screening exam, the following diagnostic evaluation has been ordered. The patient will be placed in the appropriate treatment space, once one is available, to complete the evaluation and treatment. I have discussed the plan of care with the patient and I have advised the patient that an ED physician or mid-level practitioner will reevaluate their condition after the test results have been received, as the results may give them additional insight into the type of treatment they may need.    Diagnostics: Labs, EKG,  Treatments: none immediately   Teodoro Spray, [place] 03/14/22 1913

## 2022-03-15 ENCOUNTER — Emergency Department
Admission: EM | Admit: 2022-03-15 | Discharge: 2022-03-15 | Disposition: A | Discharge status: Home / Self Care | Payer: No Typology Code available for payment source | Attending: Emergency Medicine | Admitting: Emergency Medicine

## 2022-03-15 DIAGNOSIS — R0789 Other chest pain: Secondary | ICD-10-CM

## 2022-03-15 MED ORDER — LIDOCAINE VISCOUS HCL 2 % MT SOLN
OROMUCOSAL | Status: AC
  Filled 2022-03-15: qty 15

## 2022-03-15 MED ORDER — LIDOCAINE VISCOUS HCL 2 % MT SOLN
15.0000 mL | Freq: Once | OROMUCOSAL | Status: AC
  Administered 2022-03-15: 15 mL via OROMUCOSAL

## 2022-03-15 MED ORDER — ALUM & MAG HYDROXIDE-SIMETH 200-200-20 MG/5ML PO SUSP
ORAL | Status: AC
  Filled 2022-03-15: qty 30

## 2022-03-15 MED ORDER — ALUM & MAG HYDROXIDE-SIMETH 200-200-20 MG/5ML PO SUSP
30.0000 mL | Freq: Once | ORAL | Status: AC
  Administered 2022-03-15: 30 mL via ORAL

## 2022-03-15 MED ORDER — LORAZEPAM 1 MG PO TABS
1.0000 mg | ORAL_TABLET | Freq: Once | ORAL | Status: AC
  Administered 2022-03-15: 1 mg via ORAL

## 2022-03-15 MED ORDER — LORAZEPAM 1 MG PO TABS
ORAL_TABLET | ORAL | Status: AC
  Filled 2022-03-15: qty 1

## 2022-03-15 NOTE — ED Provider Notes (Signed)
[facility] Provider Note    Event Date/Time   First MD Initiated Contact with Patient 03/15/22 [phone]     (approximate)   History   Chest Pain   HPI  Timothy [name] is a 26 y.o. male who presents to the ED for evaluation of Chest Pain   I review GI clinic visit from 7/17.  I also reviewed recurrent ED visits for chest pain.  This is his third visit in a row in less than 72 hours for the same complaint.  I saw him 2 days ago and remember him distinctly.  He presents to the ED for evaluation of another episode of chest pain after getting into an argument with his mom.  He elaborates at length about his psychosocial stressors, difficulty getting insurance to facilitate stress test that would allow him to be sedated for GI endoscopy/colonoscopy.  Reports he has a psychiatrist visit coming up in August.  Reports his chest pain is improved now but reports frustration with the situation.  No fevers, syncopal episodes, cough.   Physical Exam   Triage Vital Signs: ED Triage Vitals  Enc Vitals Group     BP 03/14/22 2040 (!) 158/100     Pulse Rate 03/14/22 2040 78     Resp 03/14/22 2040 20     Temp 03/14/22 2040 98.6 F (37 C)     Temp Source 03/14/22 2040 Oral     SpO2 03/14/22 2040 98 %     Weight 03/14/22 1904 266 lb 12.1 oz (121 kg)     Height 03/14/22 1904 6' (1.829 m)     Head Circumference --      Peak Flow --      Pain Score 03/14/22 1904 5     Pain Loc --      Pain Edu? --      Excl. in [place]? --     Most recent vital signs: Vitals:   03/14/22 2040 03/14/22 2136  BP: (!) 158/100 113/70  Pulse: 78 82  Resp: 20 20  Temp: 98.6 F (37 C) 98.5 F (36.9 C)  SpO2: 98% 97%    General: Awake, no distress.  Anxious, loquacious.  Ambulatory with normal gait CV:  Good peripheral perfusion. RRR without appreciable murmurs Resp:  Normal effort.  CTA B Abd:  No distention.  MSK:  No deformity noted.  Neuro:  No focal deficits appreciated.    Other:     ED Results / Procedures / Treatments   Labs (all labs ordered are listed, but only abnormal results are displayed) Labs Reviewed  BASIC METABOLIC PANEL  CBC  TROPONIN I (HIGH SENSITIVITY)  TROPONIN I (HIGH SENSITIVITY)    EKG   RADIOLOGY   Official radiology report(s): No results found.  PROCEDURES and INTERVENTIONS:  Procedures  Medications - No data to display   IMPRESSION / MDM / [place] / ED COURSE  I reviewed the triage vital signs and the nursing notes.  Differential diagnosis includes, but is not limited to, ACS, PTX, PNA, muscle strain/spasm, PE, dissection, anxiety, malingering  {Patient presents with symptoms of an acute illness or injury that is potentially life-threatening.  26 year old male presents to the ED with another episode of chest pain with a benign work-up.  Reassuring work-up.  No tachycardia or hypoxia.  PERC negative and PE unlikely.  Had a negative D-dimer 2 days ago.  2 negative troponins, normal BNP and CBC.  EKG continues to be nonischemic.  I considered observation admission for this patient, but ultimately we will treat symptomatically and discharge with close return precautions      FINAL CLINICAL IMPRESSION(S) / ED DIAGNOSES   Final diagnoses:  None     Rx / DC Orders   ED Discharge Orders     None        Note:  This document was prepared using Dragon voice recognition software and may include unintentional dictation errors.   Vladimir Crofts, MD 03/15/22 [phone]

## 2022-03-16 ENCOUNTER — Ambulatory Visit: Payer: Self-pay | Admitting: Internal Medicine

## 2022-03-16 ENCOUNTER — Encounter: Payer: Self-pay | Admitting: Internal Medicine

## 2022-03-16 ENCOUNTER — Ambulatory Visit (INDEPENDENT_AMBULATORY_CARE_PROVIDER_SITE_OTHER): Payer: Self-pay | Admitting: Internal Medicine

## 2022-03-16 ENCOUNTER — Ambulatory Visit: Payer: Self-pay | Admitting: *Deleted

## 2022-03-16 ENCOUNTER — Telehealth: Payer: Self-pay | Admitting: Internal Medicine

## 2022-03-16 VITALS — BP 106/74 | HR 80 | Temp 96.6°F | Wt 264.0 lb

## 2022-03-16 DIAGNOSIS — R0789 Other chest pain: Secondary | ICD-10-CM

## 2022-03-16 DIAGNOSIS — F32A Depression, unspecified: Secondary | ICD-10-CM

## 2022-03-16 DIAGNOSIS — Z7689 Persons encountering health services in other specified circumstances: Secondary | ICD-10-CM

## 2022-03-16 DIAGNOSIS — F419 Anxiety disorder, unspecified: Secondary | ICD-10-CM

## 2022-03-16 DIAGNOSIS — K219 Gastro-esophageal reflux disease without esophagitis: Secondary | ICD-10-CM

## 2022-03-16 MED ORDER — SUCRALFATE 1 GM/10ML PO SUSP
1.0000 g | Freq: Four times a day (QID) | ORAL | 0 refills | Status: DC
Start: 2022-03-16 — End: 2022-03-17

## 2022-03-16 MED ORDER — LITHIUM CITRATE 300 MG/5 ML PO SYRP: 300.0000 mg | Freq: Two times a day (BID) | ORAL | 0 refills | Status: DC

## 2022-03-16 MED ORDER — HYDROXYZINE HCL 10 MG/5ML PO SYRP
50.0000 mg | ORAL_SOLUTION | Freq: Three times a day (TID) | ORAL | 0 refills | Status: AC | PRN
Start: 2022-03-16 — End: 2022-06-14

## 2022-03-16 NOTE — Telephone Encounter (Signed)
Reason for Disposition  [1] Numbness or tingling in one or both hands AND [2] is a chronic symptom (recurrent or ongoing AND present > 4 weeks)  Answer Assessment - Initial Assessment Questions 1. SYMPTOM: "What is the main symptom you are concerned about?" (e.g., weakness, numbness)     Numbness in hands and arms at night 2. ONSET: "When did this start?" (minutes, hours, days; while sleeping)     2-3 weeks 3. LAST NORMAL: "When was the last time you (the patient) were normal (no symptoms)?"     Numbness has been moving around in body 4. PATTERN "Does this come and go, or has it been constant since it started?"  "Is it present now?"     Comes and goes- mostly at night 5. CARDIAC SYMPTOMS: "Have you had any of the following symptoms: chest pain, difficulty breathing, palpitations?"     No- not at this time 6. NEUROLOGIC SYMPTOMS: "Have you had any of the following symptoms: headache, dizziness, vision loss, double vision, changes in speech, unsteady on your feet?"     no 7. OTHER SYMPTOMS: "Do you have any other symptoms?"     Abdominal issues- constipation 8. PREGNANCY: "Is there any chance you are pregnant?" "When was your last menstrual period?"  Protocols used: Neurologic Deficit-A-AH

## 2022-03-16 NOTE — Telephone Encounter (Signed)
  Chief Complaint: hand and arm pain Symptoms: hand/arm pain at night Frequency: 2-3 weeks- off/on Pertinent Negatives: Patient denies headache, dizziness, vision loss, double vision, changes in speech, unsteady on your feet?" Disposition: '[]'$ ED /'[]'$ Urgent Care (no appt availability in office) / '[]'$ Appointment(In office/virtual)/ '[]'$  [facility]/ '[]'$ Home Care/ '[]'$ Refused Recommended Disposition /'[]'$ [facility]/ '[x]'$  Follow-up with PCP Additional Notes: Patient has appointment today- advised keep appointment

## 2022-03-16 NOTE — Telephone Encounter (Signed)
Pt said he went to pharmacy to pick up lithium citrate 300 MG/5ML solution and would like to know if this can be changed to the capsule form. Pt is currently at pharmacy. Please send rx to  [place], [place]  [address] [place] [address]  Phone:  [phone]  Fax:  [phone]

## 2022-03-16 NOTE — Telephone Encounter (Signed)
[facility] is not able to get the liquid lithium so pt is requesting capsule form.  He also request carafate in tablet instead of liquid.  He says the liquid was too expensive.  (He spoke with the pharmacist and he is able to dissolve the tablets in water.)    Thanks,   -Mickel Baas

## 2022-03-16 NOTE — Progress Notes (Deleted)
Subjective:    Patient ID: Timothy [name], male    DOB: 1995/12/27, 26 y.o.   MRN: [id number]  HPI  Patient presents to clinic today for multiple ER follow-up.  He has presented to the ER 6/25, 6/27, 6/28, 6/30, 7/1, 7/5, 7/8, 7/16, 7/19 with complaints of chest pain.  He has had multiple EKGs, chest x-rays and lab work-up which all have been normal.  Review of Systems     Past Medical History:  Diagnosis Date   Allergy    Depression    Diverticulitis    Dyspnea    Fainting spell    in middle school   Family history of bone cancer    Family history of breast cancer     Current Outpatient Medications  Medication Sig Dispense Refill   hydrOXYzine (ATARAX) 10 MG/5ML syrup Take 12.5 mLs (25 mg total) by mouth 3 (three) times daily as needed for up to 20 doses for anxiety. (Patient not taking: Reported on 03/14/2022) 240 mL 0   lidocaine (XYLOCAINE) 2 % solution Use as directed 15 mLs in the mouth or throat as needed for mouth pain. (Patient not taking: Reported on 03/14/2022) 100 mL 0   pantoprazole sodium (PROTONIX) 40 mg Take 40 mg by mouth daily. (Patient not taking: Reported on 03/14/2022) 30 packet 0   rizatriptan (MAXALT-MLT) 5 MG disintegrating tablet Take 1 tablet (5 mg total) by mouth as needed for migraine. May repeat in 2 hours if needed (Patient not taking: Reported on 03/14/2022) 10 tablet 0   sucralfate (CARAFATE) 1 GM/10ML suspension Take 10 mLs (1 g total) by mouth 4 (four) times daily. (Patient not taking: Reported on 03/14/2022) 420 mL 1   No current facility-administered medications for this visit.    No Known Allergies  Family History  Problem Relation Age of Onset   Heart disease Maternal Grandmother    Diabetes Maternal Grandmother    Cancer Maternal Grandmother        breast cancer   Alport syndrome Maternal Grandmother    Cancer Paternal Grandfather        Bone cancer   Healthy Sister    Alport syndrome Maternal Aunt    Depression Mother    Cervical  cancer Mother     Social History   Socioeconomic History   Marital status: Single    Spouse name: Not on file   Number of children: Not on file   Years of education: Not on file   Highest education level: Not on file  Occupational History   Not on file  Tobacco Use   Smoking status: Former    Packs/day: 0.25    Types: Cigarettes   Smokeless tobacco: Current    Types: Snuff   Tobacco comments:    pt smoke pack a week  Vaping Use   Vaping Use: Every day   Last attempt to quit: 09/09/2019   Substances: Nicotine, Flavoring  Substance and Sexual Activity   Alcohol use: Not Currently    Alcohol/week: 6.0 standard drinks of alcohol    Types: 6 Cans of beer per week    Comment: occassionally   Drug use: No   Sexual activity: Not on file  Other Topics Concern   Not on file  Social History Narrative   Not on file   Social Determinants of Health   Financial Resource Strain: Not on file  Food Insecurity: Not on file  Transportation Needs: Not on file  Physical Activity: Not on file  Stress: Not on file  Social Connections: Not on file  Intimate Partner Violence: Not on file     Constitutional: Denies fever, malaise, fatigue, headache or abrupt weight changes.  HEENT: Denies eye pain, eye redness, ear pain, ringing in the ears, wax buildup, runny nose, nasal congestion, bloody nose, or sore throat. Respiratory: Denies difficulty breathing, shortness of breath, cough or sputum production.   Cardiovascular: Denies chest pain, chest tightness, palpitations or swelling in the hands or feet.  Gastrointestinal: Denies abdominal pain, bloating, constipation, diarrhea or blood in the stool.  GU: Denies urgency, frequency, pain with urination, burning sensation, blood in urine, odor or discharge. Musculoskeletal: Denies decrease in range of motion, difficulty with gait, muscle pain or joint pain and swelling.  Skin: Denies redness, rashes, lesions or ulcercations.  Neurological:  Denies dizziness, difficulty with memory, difficulty with speech or problems with balance and coordination.  Psych: Patient reports anxiety.  Denies depression, SI/HI.  No other specific complaints in a complete review of systems (except as listed in HPI above).  Objective:   Physical Exam  There were no vitals taken for this visit. Wt Readings from Last 3 Encounters:  03/14/22 266 lb 12.1 oz (121 kg)  03/14/22 267 lb (121.1 kg)  03/12/22 274 lb (124.3 kg)    General: Appears their stated age, well developed, well nourished in NAD. Skin: Warm, dry and intact. No rashes, lesions or ulcerations noted. HEENT: Head: normal shape and size; Eyes: sclera white, no icterus, conjunctiva pink, PERRLA and EOMs intact; Ears: Tm's gray and intact, normal light reflex; Nose: mucosa pink and moist, septum midline; Throat/Mouth: Teeth present, mucosa pink and moist, no exudate, lesions or ulcerations noted.  Neck:  Neck supple, trachea midline. No masses, lumps or thyromegaly present.  Cardiovascular: Normal rate and rhythm. S1,S2 noted.  No murmur, rubs or gallops noted. No JVD or BLE edema. No carotid bruits noted. Pulmonary/Chest: Normal effort and positive vesicular breath sounds. No respiratory distress. No wheezes, rales or ronchi noted.  Abdomen: Soft and nontender. Normal bowel sounds. No distention or masses noted. Liver, spleen and kidneys non palpable. Musculoskeletal: Normal range of motion. No signs of joint swelling. No difficulty with gait.  Neurological: Alert and oriented. Cranial nerves II-XII grossly intact. Coordination normal.  Psychiatric: Mood and affect normal. Behavior is normal. Judgment and thought content normal.    BMET    Component Value Date/Time   NA 141 03/14/2022 1905   NA 138 12/16/2014 0839   K 4.1 03/14/2022 1905   K 3.9 12/16/2014 0839   CL 108 03/14/2022 1905   CL 106 12/16/2014 0839   CO2 25 03/14/2022 1905   CO2 26 12/16/2014 0839   GLUCOSE 88 03/14/2022  1905   GLUCOSE 110 (H) 12/16/2014 0839   BUN 14 03/14/2022 1905   BUN 11 12/16/2014 0839   CREATININE 1.13 03/14/2022 1905   CREATININE 1.05 02/02/2022 1022   CALCIUM 9.3 03/14/2022 1905   CALCIUM 8.8 (L) 12/16/2014 0839   GFRNONAA >60 03/14/2022 1905   GFRNONAA >60 12/16/2014 0839   GFRAA >60 12/09/2019 0555   GFRAA >60 12/16/2014 0839    Lipid Panel     Component Value Date/Time   CHOL 165 02/02/2022 1022   TRIG 330 (H) 02/02/2022 1022   HDL 31 (L) 02/02/2022 1022   CHOLHDL 5.3 (H) 02/02/2022 1022   LDLCALC 90 02/02/2022 1022    CBC    Component Value Date/Time   WBC 9.4 03/14/2022 1905   RBC  5.03 03/14/2022 1905   HGB 14.8 03/14/2022 1905   HGB 14.6 12/16/2014 0839   HCT 44.6 03/14/2022 1905   HCT 44.0 12/16/2014 0839   PLT 240 03/14/2022 1905   PLT 233 12/16/2014 0839   MCV 88.7 03/14/2022 1905   MCV 88 12/16/2014 0839   MCH 29.4 03/14/2022 1905   MCHC 33.2 03/14/2022 1905   RDW 12.6 03/14/2022 1905   RDW 13.8 12/16/2014 0839   LYMPHSABS 3.0 03/13/2022 1854   LYMPHSABS 2.9 12/16/2014 0839   MONOABS 1.0 03/13/2022 1854   MONOABS 1.2 (H) 12/16/2014 0839   EOSABS 0.1 03/13/2022 1854   EOSABS 0.3 12/16/2014 0839   BASOSABS 0.0 03/13/2022 1854   BASOSABS 0.1 12/16/2014 0839    Hgb A1C Lab Results  Component Value Date   HGBA1C 5.6 02/02/2022            Assessment & Plan:   ER Follow-up for Chest Pain, Anxiety:  ER notes, labs and imaging reviewed Discussed his overuse of the emergency department  Return precautions discussed [name], NP

## 2022-03-16 NOTE — Telephone Encounter (Signed)
Do they not have the liquid?

## 2022-03-16 NOTE — Progress Notes (Signed)
Subjective:    Patient ID: Timothy [name], male    DOB: 07-13-1996, 26 y.o.   MRN: [id number]  HPI  Patient presents to clinic today for multiple ER follow-ups.  He has presented to the ER 6/25, 6/27, 6/28, 6/30, 7/1, 7/5, 7/8, 7/16 and 7/18 with complaints of epigastric pain, chest pain and anxiety.  Lab work-up has been unrevealing.  He has had multiple EKGs which all of all been stable.  He has had numerous chest x-rays.  They all felt his chest pain was related to anxiety.  He has been referred to cardiology in the past for further evaluation but is unable to afford this appointment.  He has an appointment psychiatry 04/18/2022.  He has been prescribed multiple medications in the past including hydroxyzine, sulcarafate pantoprazole but does not take any of these medications consistently.  He has a swallowing issue and is unable to swallow pills.  He is under a lot of stress, dealing with these health issues and recently fired from his job.  He reports these physical complaints make him feel like he is dying.  Review of Systems     Past Medical History:  Diagnosis Date   Allergy    Depression    Diverticulitis    Dyspnea    Fainting spell    in middle school   Family history of bone cancer    Family history of breast cancer     Current Outpatient Medications  Medication Sig Dispense Refill   lithium citrate 300 MG/5ML solution Take 5 mLs (300 mg total) by mouth 2 (two) times daily. 900 mL 0   hydrOXYzine (ATARAX) 10 MG/5ML syrup Take 25 mLs (50 mg total) by mouth 3 (three) times daily as needed for anxiety. 473 mL 0   sucralfate (CARAFATE) 1 GM/10ML suspension Take 10 mLs (1 g total) by mouth 4 (four) times daily. 420 mL 0   No current facility-administered medications for this visit.    No Known Allergies  Family History  Problem Relation Age of Onset   Heart disease Maternal Grandmother    Diabetes Maternal Grandmother    Cancer Maternal Grandmother        breast cancer    Alport syndrome Maternal Grandmother    Cancer Paternal Grandfather        Bone cancer   Healthy Sister    Alport syndrome Maternal Aunt    Depression Mother    Cervical cancer Mother     Social History   Socioeconomic History   Marital status: Single    Spouse name: Not on file   Number of children: Not on file   Years of education: Not on file   Highest education level: Not on file  Occupational History   Not on file  Tobacco Use   Smoking status: Every Day    Packs/day: 0.25    Types: Cigarettes   Smokeless tobacco: Former    Types: Snuff   Tobacco comments:    pt smoke pack a week  Vaping Use   Vaping Use: Former   Quit date: 09/09/2019   Substances: Nicotine, Flavoring  Substance and Sexual Activity   Alcohol use: Not Currently    Alcohol/week: 6.0 standard drinks of alcohol    Types: 6 Cans of beer per week    Comment: occassionally   Drug use: No   Sexual activity: Not on file  Other Topics Concern   Not on file  Social History Narrative   Not on  file   Social Determinants of Health   Financial Resource Strain: Not on file  Food Insecurity: Not on file  Transportation Needs: Not on file  Physical Activity: Not on file  Stress: Not on file  Social Connections: Not on file  Intimate Partner Violence: Not on file     Constitutional: Patient reports intermittent headaches.  Denies fever, malaise, fatigue, or abrupt weight changes.  HEENT: Denies eye pain, eye redness, ear pain, ringing in the ears, wax buildup, runny nose, nasal congestion, bloody nose, or sore throat. Respiratory: Patient reports shortness of breath.  Denies difficulty breathing, cough or sputum production.   Cardiovascular: Patient reports chest pain.  Denies chest tightness, palpitations or swelling in the hands or feet.  Gastrointestinal: Patient reports epigastric pain, nausea, vomiting.  Denies bloating, constipation, diarrhea or blood in the stool.  GU: Denies urgency,  frequency, pain with urination, burning sensation, blood in urine, odor or discharge. Musculoskeletal: Denies decrease in range of motion, difficulty with gait, muscle pain or joint pain and swelling.  Skin: Denies redness, rashes, lesions or ulcercations.  Neurological: Patient reports intermittent dizziness.  Denies difficulty with memory, difficulty with speech or problems with balance and coordination.  Psych: Patient reports anxiety.  Denies depression, SI/HI.  No other specific complaints in a complete review of systems (except as listed in HPI above).  Objective:   Physical Exam  BP 106/74 (BP Location: Left Arm, Patient Position: Sitting, Cuff Size: Large)   Pulse 80   Temp (!) 96.6 F (35.9 C) (Temporal)   Wt 264 lb (119.7 kg)   SpO2 99%   BMI 35.80 kg/m  Wt Readings from Last 3 Encounters:  03/16/22 264 lb (119.7 kg)  03/14/22 266 lb 12.1 oz (121 kg)  03/14/22 267 lb (121.1 kg)    General: Appears his stated age, obese, in NAD. Skin: Warm, dry and intact.  Clammy. HEENT: Head: normal shape and size; Eyes: sclera white, no icterus, conjunctiva pink, PERRLA and EOMs intact;  Cardiovascular: Normal rate and rhythm. S1,S2 noted.  No murmur, rubs or gallops noted. Pulmonary/Chest: Normal effort and positive vesicular breath sounds. No respiratory distress. No wheezes, rales or ronchi noted.  Abdomen: Soft and tender in the epigastric region.  Musculoskeletal:No difficulty with gait.  Neurological: Alert and oriented. Coordination normal.  Psychiatric: He is anxious appearing.  He seems to have racing thoughts.  BMET    Component Value Date/Time   NA 141 03/14/2022 1905   NA 138 12/16/2014 0839   K 4.1 03/14/2022 1905   K 3.9 12/16/2014 0839   CL 108 03/14/2022 1905   CL 106 12/16/2014 0839   CO2 25 03/14/2022 1905   CO2 26 12/16/2014 0839   GLUCOSE 88 03/14/2022 1905   GLUCOSE 110 (H) 12/16/2014 0839   BUN 14 03/14/2022 1905   BUN 11 12/16/2014 0839   CREATININE  1.13 03/14/2022 1905   CREATININE 1.05 02/02/2022 1022   CALCIUM 9.3 03/14/2022 1905   CALCIUM 8.8 (L) 12/16/2014 0839   GFRNONAA >60 03/14/2022 1905   GFRNONAA >60 12/16/2014 0839   GFRAA >60 12/09/2019 0555   GFRAA >60 12/16/2014 0839    Lipid Panel     Component Value Date/Time   CHOL 165 02/02/2022 1022   TRIG 330 (H) 02/02/2022 1022   HDL 31 (L) 02/02/2022 1022   CHOLHDL 5.3 (H) 02/02/2022 1022   LDLCALC 90 02/02/2022 1022    CBC    Component Value Date/Time   WBC 9.4 03/14/2022 1905  RBC 5.03 03/14/2022 1905   HGB 14.8 03/14/2022 1905   HGB 14.6 12/16/2014 0839   HCT 44.6 03/14/2022 1905   HCT 44.0 12/16/2014 0839   PLT 240 03/14/2022 1905   PLT 233 12/16/2014 0839   MCV 88.7 03/14/2022 1905   MCV 88 12/16/2014 0839   MCH 29.4 03/14/2022 1905   MCHC 33.2 03/14/2022 1905   RDW 12.6 03/14/2022 1905   RDW 13.8 12/16/2014 0839   LYMPHSABS 3.0 03/13/2022 1854   LYMPHSABS 2.9 12/16/2014 0839   MONOABS 1.0 03/13/2022 1854   MONOABS 1.2 (H) 12/16/2014 0839   EOSABS 0.1 03/13/2022 1854   EOSABS 0.3 12/16/2014 0839   BASOSABS 0.0 03/13/2022 1854   BASOSABS 0.1 12/16/2014 0839    Hgb A1C Lab Results  Component Value Date   HGBA1C 5.6 02/02/2022            Assessment & Plan:   ER follow-up for GERD, Chest Pain, Anxiety:  I tried to make him understand that anxiety can cause physical complaints as well as premonitions of dying ER notes, labs and imaging reviewed with the patient He was planning on following up with cardiology and gastroenterology but due to his lack of insurance he will have to put this on hold We will try to put him on lithium 300 mg twice daily Refilled hydroxyzine and sulcarafate, advised him to pick this up and take this as prescribed. He has an appointment with psychiatry that he will keep for 8/21 I discussed with him how his frequent ER visits are increasing medical cost and not beneficial for him Advised him that if he no showed  another appointment that he would be discharged from the practice  Return precautions discussed

## 2022-03-16 NOTE — Patient Instructions (Signed)

## 2022-03-17 MED ORDER — SUCRALFATE 1 G PO TABS: 1.0000 g | ORAL_TABLET | Freq: Three times a day (TID) | ORAL | 2 refills | Status: DC

## 2022-03-17 MED ORDER — LITHIUM CARBONATE 150 MG PO CAPS: ORAL_CAPSULE | ORAL | 0 refills | Status: DC

## 2022-03-17 NOTE — Addendum Note (Signed)
Addended by: Jearld Fenton on: 03/17/2022 08:57 AM   Modules accepted: Orders

## 2022-03-17 NOTE — Telephone Encounter (Signed)
I have sent requested prescriptions to his pharmacy

## 2022-03-18 ENCOUNTER — Ambulatory Visit (INDEPENDENT_AMBULATORY_CARE_PROVIDER_SITE_OTHER): Payer: Self-pay | Admitting: Cardiovascular Disease

## 2022-03-18 ENCOUNTER — Encounter: Payer: Self-pay | Admitting: Cardiovascular Disease

## 2022-03-18 VITALS — BP 102/78 | HR 76 | Ht 72.0 in | Wt 266.0 lb

## 2022-03-18 DIAGNOSIS — Z720 Tobacco use: Secondary | ICD-10-CM

## 2022-03-18 DIAGNOSIS — F32A Depression, unspecified: Secondary | ICD-10-CM

## 2022-03-18 DIAGNOSIS — Z6836 Body mass index (BMI) 36.0-36.9, adult: Secondary | ICD-10-CM

## 2022-03-18 DIAGNOSIS — F419 Anxiety disorder, unspecified: Secondary | ICD-10-CM

## 2022-03-18 DIAGNOSIS — R079 Chest pain, unspecified: Secondary | ICD-10-CM

## 2022-03-18 DIAGNOSIS — E669 Obesity, unspecified: Secondary | ICD-10-CM

## 2022-03-18 NOTE — Patient Instructions (Signed)
Medication Instructions:   NONE *If you need a refill on your cardiac medications before your next appointment, please call your pharmacy*   Lab Work:  NONE  If you have labs (blood work) drawn today and your tests are completely normal, you will receive your results only by: [place] (if you have MyChart) OR A paper copy in the mail If you have any lab test that is abnormal or we need to change your treatment, we will call you to review the results.   Testing/Procedures:  Your physician has requested that you have an exercise tolerance test. For further information please visit [email or web address]. Please also follow instruction sheet, as given.    Follow-Up: At [facility], you and your health needs are our priority.  As part of our continuing mission to provide you with exceptional heart care, we have created designated Provider Care Teams.  These Care Teams include your primary Cardiologist (physician) and Advanced Practice Providers (APPs -  Physician Assistants and Nurse Practitioners) who all work together to provide you with the care you need, when you need it.  We recommend signing up for the patient portal called "MyChart".  Sign up information is provided on this After Visit Summary.  MyChart is used to connect with patients for Virtual Visits (Telemedicine).  Patients are able to view lab/test results, encounter notes, upcoming appointments, etc.  Non-urgent messages can be sent to your provider as well.   To learn more about what you can do with MyChart, go to [email or web address].    Your next appointment:    Follow up as needed.   Provider:   You may see Kathlyn Sacramento, MD or one of the following Advanced Practice Providers on your designated Care Team:   Murray Hodgkins, NP Christell Faith, PA-C Cadence Kathlen Mody, PA-C{   Important Information About Sugar

## 2022-03-18 NOTE — Progress Notes (Unsigned)
Cardiology Office Note   Date:  03/21/2022   ID:  Timothy T Grabski, DOB [birth year], MRN [id number]  PCP:  Jearld Fenton, NP  Cardiologist:   Kathlyn Sacramento, MD   Chief Complaint  Patient presents with   New Patient (Initial Visit)   Chest Pain   Edema   Shortness of Breath   Headache      History of Present Illness: Timothy [name] is a 26 y.o. male who was referred for evaluation of chest pain.  He has no prior cardiac history.  He has history of severe anxiety and possible bipolar disorder, tobacco use and obesity.  He had previous severe diverticulitis at the age of 82 that required resection of the sigmoid colon.  He reports family history of coronary artery disease but does not know the exact details.  The patient had 17 emergency room visits in the last 6 months for various symptoms including abdominal pain and chest pain.  He has severe reflux symptoms with plans for GI evaluation in the near future with an EGD.  He reports intermittent substernal chest pain radiating to his left arm that can happen both at rest and with exertion.  This has been intermittent.  Troponin has been normal and EKG with no ischemic changes.  Reports exertional dyspnea and does not exercise on a regular basis.    Past Medical History:  Diagnosis Date   Allergy    Depression    Diverticulitis    Dyspnea    Fainting spell    in middle school   Family history of bone cancer    Family history of breast cancer     Past Surgical History:  Procedure Laterality Date   COLECTOMY WITH COLOSTOMY CREATION/HARTMANN PROCEDURE N/A 07/22/2018   Procedure: COLECTOMY WITH COLOSTOMY CREATION/HARTMANN PROCEDURE;  Surgeon: Fredirick Maudlin, MD;  Location: [facility] ORS;  Service: General;  Laterality: N/A;   COLONOSCOPY WITH PROPOFOL N/A 11/01/2018   Procedure: COLONOSCOPY WITH PROPOFOL;  Surgeon: [name], MD;  Location: [facility] ENDOSCOPY;  Service: Gastroenterology;  Laterality: N/A;   COLOSTOMY REVERSAL N/A  03/22/2019   Procedure: COLOSTOMY REVERSAL;  Surgeon: Fredirick Maudlin, MD;  Location: [facility] ORS;  Service: General;  Laterality: N/A;     Current Outpatient Medications  Medication Sig Dispense Refill   hydrOXYzine (ATARAX) 10 MG/5ML syrup Take 25 mLs (50 mg total) by mouth 3 (three) times daily as needed for anxiety. 473 mL 0   lithium carbonate 150 MG capsule Take 1 capsule (150 mg total) by mouth daily for 14 days, THEN 1 capsule (150 mg total) 2 (two) times daily with a meal. 166 capsule 0   sucralfate (CARAFATE) 1 g tablet Take 1 tablet (1 g total) by mouth 4 (four) times daily -  with meals and at bedtime. 120 tablet 2   No current facility-administered medications for this visit.    Allergies:   Patient has no known allergies.    Social History:  The patient  reports that he has been smoking cigarettes. He has been smoking an average of .25 packs per day. He has quit using smokeless tobacco.  His smokeless tobacco use included snuff. He reports that he does not currently use alcohol after a past usage of about 6.0 standard drinks of alcohol per week. He reports that he does not use drugs.   Family History:  The patient's family history includes Alport syndrome in his maternal aunt and maternal grandmother; Cancer in his maternal grandmother and paternal  grandfather; Cervical cancer in his mother; Depression in his mother; Diabetes in his maternal grandmother; Healthy in his sister; Heart disease in his maternal grandmother.    ROS:  Please see the history of present illness.   Otherwise, review of systems are positive for none.   All other systems are reviewed and negative.    PHYSICAL EXAM: VS:  BP 102/78 (BP Location: Left Arm, Patient Position: Sitting, Cuff Size: Large)   Pulse 76   Ht 6' (1.829 m)   Wt 266 lb (120.7 kg)   BMI 36.08 kg/m  , BMI Body mass index is 36.08 kg/m. GEN: Well nourished, well developed, in no acute distress  HEENT: normal  Neck: no JVD, carotid  bruits, or masses Cardiac: RRR; no murmurs, rubs, or gallops,no edema  Respiratory:  clear to auscultation bilaterally, normal work of breathing GI: soft, nontender, nondistended, + BS MS: no deformity or atrophy  Skin: warm and dry, no rash Neuro:  Strength and sensation are intact Psych: euthymic mood, full affect   EKG:  EKG is ordered today. The ekg ordered today demonstrates normal sinus rhythm with sinus arrhythmia.  No significant ST or T wave changes.   Recent Labs: 02/02/2022: TSH 1.05 03/13/2022: ALT 45 03/14/2022: BUN 14; Creatinine, Ser 1.13; Hemoglobin 14.8; Platelets 240; Potassium 4.1; Sodium 141    Lipid Panel    Component Value Date/Time   CHOL 165 02/02/2022 1022   TRIG 330 (H) 02/02/2022 1022   HDL 31 (L) 02/02/2022 1022   CHOLHDL 5.3 (H) 02/02/2022 1022   LDLCALC 90 02/02/2022 1022      Wt Readings from Last 3 Encounters:  03/18/22 266 lb (120.7 kg)  03/16/22 264 lb (119.7 kg)  03/14/22 266 lb 12.1 oz (121 kg)           No data to display            ASSESSMENT AND PLAN:  1.  Atypical chest pain: His physical exam is unremarkable and there is nothing to suggest structural heart abnormalities.  Risk factors for coronary artery disease include tobacco use, hypertriglyceridemia and obesity.  His EKG is unremarkable with no ischemic changes. I recommend evaluation with a treadmill stress test.  2.  Tobacco use: The patient intermittently smokes cigarettes and sometimes vapes.  I discussed with him the importance of smoking cessation.  3.  Obesity: Discussed the importance of healthy lifestyle changes.  4.  Abdominal pain: Has severe reflux symptoms.  I agree with an EGD once we obtain a treadmill stress test.    Disposition:   FU with me as needed.  Signed,  Kathlyn Sacramento, MD  03/21/2022 7:17 AM    [place]

## 2022-03-31 ENCOUNTER — Telehealth: Payer: Self-pay

## 2022-03-31 NOTE — Telephone Encounter (Signed)
Called and left a voicemail requesting a call back. No DPR on file to leave a detailed VM. Wanted to review patient instructions for GXT tomorrow, as there were none included on the AVS when ordered. Also sent patient a MyChart  message with instructions and requested a confirmation for his appointment tomorrow.  - you may eat a light breakfast/ lunch prior to your procedure - no caffeine for 24 hours prior to your test (coffee, tea, soft drinks, or chocolate)  - no smoking/ vaping for 4 hours prior to your test - you may take your regular medications the day of your test. - bring any inhalers with you to your test - wear comfortable clothing & tennis/ non-skid shoes to walk on the treadmill

## 2022-04-01 ENCOUNTER — Ambulatory Visit (INDEPENDENT_AMBULATORY_CARE_PROVIDER_SITE_OTHER): Payer: 59

## 2022-04-01 DIAGNOSIS — R079 Chest pain, unspecified: Secondary | ICD-10-CM | POA: Diagnosis not present

## 2022-04-01 LAB — EXERCISE TOLERANCE TEST
Angina Index: 0
Base ST Depression (mm): 0 mm
Duke Treadmill Score: 10
Estimated workload: 11.9
Exercise duration (min): 10 min
Exercise duration (sec): 11 s
MPHR: 193 {beats}/min
Peak HR: 173 {beats}/min
Percent HR: 89 %
Rest HR: 88 {beats}/min
ST Depression (mm): 0 mm

## 2022-04-04 ENCOUNTER — Other Ambulatory Visit: Payer: Self-pay

## 2022-04-04 ENCOUNTER — Telehealth: Payer: Self-pay

## 2022-04-04 ENCOUNTER — Telehealth: Payer: Self-pay | Admitting: Cardiovascular Disease

## 2022-04-04 DIAGNOSIS — K219 Gastro-esophageal reflux disease without esophagitis: Secondary | ICD-10-CM

## 2022-04-04 DIAGNOSIS — K5909 Other constipation: Secondary | ICD-10-CM

## 2022-04-04 DIAGNOSIS — R1084 Generalized abdominal pain: Secondary | ICD-10-CM

## 2022-04-04 DIAGNOSIS — Z860100 Personal history of colon polyps, unspecified: Secondary | ICD-10-CM

## 2022-04-04 MED ORDER — NA SULFATE-K SULFATE-MG SULF 17.5-3.13-1.6 GM/177ML PO SOLN: 1.0000 | Freq: Once | ORAL | 0 refills | Status: DC

## 2022-04-04 NOTE — Telephone Encounter (Signed)
   Primary Cardiologist: Kathlyn Sacramento, MD  Chart reviewed as part of pre-operative protocol coverage. Given past medical history and time since last visit, based on ACC/AHA guidelines, [place] T Felker would be at acceptable risk for the planned procedure without further cardiovascular testing.   I will route this recommendation to the requesting party via Epic fax function and remove from pre-op pool.  Please call with questions.  Emmaline Life, NP-C    04/04/2022, 4:45 PM [place] [address] Office [phone] Fax [phone]

## 2022-04-04 NOTE — Telephone Encounter (Signed)
Dr. Fletcher Anon replied back and patient is cleared to have his procedure done on 04/28/2022 with Dr. Vicente Males. Patient was also notified. Please see other note.

## 2022-04-04 NOTE — Telephone Encounter (Signed)
Patient called stating that he had the okay from Dr. Fletcher Anon to proceed with an EGD and Colonoscopy with Dr. Vicente Males. I looked into patient's chart and he was correct. On 03/18/2022 he was able to see Dr. Fletcher Anon as a new patient for chest pain. He then ordered a stress test on the patient which was performed on 04/01/2022. Dr. Fletcher Anon had stated on his office visit that if the stress test was normal, then he was able to proceed with procedures. Even though this had all been documented, I will send Dr. Fletcher Anon our cardiac clearance form to be filled out and that way we could proceed with scheduling patient's procedures. Cardiac clearance was faxed to Dr. Tyrell Antonio office to [phone].

## 2022-04-04 NOTE — Telephone Encounter (Signed)
   Pre-operative Risk Assessment    [name]  DOB: 09-22-1995 MRN: [id number]      Request for Surgical Clearance    Procedure:   EGD and colonoscopy   Date of Surgery:  Clearance TBD                                 Surgeon:  not indicated  Surgeon's Group or Practice Name:  [facility] Phone number:  [phone] Fax number:  [phone]   Type of Clearance Requested:   - Medical    Type of Anesthesia:  General    Additional requests/questions:    Manfred Arch   04/04/2022, 4:35 PM
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
## 2022-04-18 ENCOUNTER — Ambulatory Visit (INDEPENDENT_AMBULATORY_CARE_PROVIDER_SITE_OTHER): Payer: 59 | Admitting: Psychiatry

## 2022-04-18 ENCOUNTER — Encounter: Payer: Self-pay | Admitting: Psychiatry

## 2022-04-18 VITALS — BP 125/84 | HR 82 | Temp 98.3°F | Wt 273.4 lb

## 2022-04-18 DIAGNOSIS — F3342 Major depressive disorder, recurrent, in full remission: Secondary | ICD-10-CM

## 2022-04-18 DIAGNOSIS — R69 Illness, unspecified: Secondary | ICD-10-CM | POA: Diagnosis not present

## 2022-04-18 DIAGNOSIS — F411 Generalized anxiety disorder: Secondary | ICD-10-CM | POA: Diagnosis not present

## 2022-04-18 NOTE — Patient Instructions (Signed)
Although starting antidepressant was recommended, he declined this.  Next appointment: 9/25 at 11 AM

## 2022-04-22 ENCOUNTER — Ambulatory Visit: Payer: No Typology Code available for payment source | Admitting: Cardiology

## 2022-04-27 MED ORDER — PEG 3350-KCL-NA BICARB-NACL 420 G PO SOLR: ORAL | 0 refills | Status: DC

## 2022-04-28 ENCOUNTER — Encounter
Admission: RE | Disposition: A | Discharge status: Home / Self Care | Payer: Self-pay | Source: Ambulatory Visit | Attending: Gastroenterology

## 2022-04-28 ENCOUNTER — Encounter: Payer: Self-pay | Admitting: Anesthesiology

## 2022-04-28 ENCOUNTER — Ambulatory Visit
Admission: RE | Admit: 2022-04-28 | Discharge: 2022-04-28 | Disposition: A | Discharge status: Home / Self Care | Payer: 59 | Source: Ambulatory Visit | Attending: Gastroenterology | Admitting: Gastroenterology

## 2022-04-28 DIAGNOSIS — Z86010 Personal history of colonic polyps: Secondary | ICD-10-CM

## 2022-04-28 DIAGNOSIS — K219 Gastro-esophageal reflux disease without esophagitis: Secondary | ICD-10-CM

## 2022-04-28 DIAGNOSIS — K5909 Other constipation: Secondary | ICD-10-CM

## 2022-04-28 DIAGNOSIS — R1084 Generalized abdominal pain: Secondary | ICD-10-CM

## 2022-04-28 SURGERY — COLONOSCOPY WITH PROPOFOL
Anesthesia: General

## 2022-04-28 MED ORDER — SODIUM CHLORIDE 0.9 % IV SOLN: INTRAVENOUS | Status: DC

## 2022-04-28 NOTE — Progress Notes (Signed)
Patient not clean needs to reschedule

## 2022-04-29 ENCOUNTER — Other Ambulatory Visit: Payer: Self-pay

## 2022-04-29 ENCOUNTER — Telehealth: Payer: Self-pay

## 2022-04-29 DIAGNOSIS — R1084 Generalized abdominal pain: Secondary | ICD-10-CM

## 2022-04-29 DIAGNOSIS — Z86010 Personal history of colonic polyps: Secondary | ICD-10-CM

## 2022-04-29 DIAGNOSIS — K5909 Other constipation: Secondary | ICD-10-CM

## 2022-04-29 DIAGNOSIS — K219 Gastro-esophageal reflux disease without esophagitis: Secondary | ICD-10-CM

## 2022-04-29 MED ORDER — PEG 3350-KCL-NA BICARB-NACL 420 G PO SOLR: ORAL | 0 refills | Status: DC

## 2022-04-29 MED ORDER — NA SULFATE-K SULFATE-MG SULF 17.5-3.13-1.6 GM/177ML PO SOLN: 354.0000 mL | Freq: Once | ORAL | 0 refills | Status: DC

## 2022-04-29 NOTE — Telephone Encounter (Signed)
Patient called back and he agreed on rescheduling his procedures to 05/26/2022 with Dr. Vicente Males at [facility]. Prescription was sent and patient had no further questions.

## 2022-04-29 NOTE — Telephone Encounter (Signed)
Called patient to reschedule his procedures but he did not pick up and I left him a voicemail to call me back.
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
## 2022-05-23 ENCOUNTER — Ambulatory Visit: Payer: No Typology Code available for payment source | Admitting: Psychiatry

## 2022-05-26 ENCOUNTER — Ambulatory Visit
Admission: RE | Admit: 2022-05-26 | Discharge: 2022-05-26 | Disposition: A | Discharge status: Home / Self Care | Payer: 59 | Attending: Gastroenterology | Admitting: Gastroenterology

## 2022-05-26 ENCOUNTER — Ambulatory Visit: Payer: 59 | Admitting: Anesthesiology

## 2022-05-26 ENCOUNTER — Encounter: Payer: Self-pay | Admitting: Gastroenterology

## 2022-05-26 ENCOUNTER — Encounter
Admission: RE | Disposition: A | Discharge status: Home / Self Care | Payer: Self-pay | Source: Home / Self Care | Attending: Gastroenterology

## 2022-05-26 DIAGNOSIS — Z860100 Personal history of colon polyps, unspecified: Secondary | ICD-10-CM

## 2022-05-26 DIAGNOSIS — R0789 Other chest pain: Secondary | ICD-10-CM

## 2022-05-26 DIAGNOSIS — F319 Bipolar disorder, unspecified: Secondary | ICD-10-CM | POA: Insufficient documentation

## 2022-05-26 DIAGNOSIS — R1084 Generalized abdominal pain: Secondary | ICD-10-CM | POA: Diagnosis not present

## 2022-05-26 DIAGNOSIS — K317 Polyp of stomach and duodenum: Secondary | ICD-10-CM | POA: Diagnosis not present

## 2022-05-26 DIAGNOSIS — K219 Gastro-esophageal reflux disease without esophagitis: Secondary | ICD-10-CM

## 2022-05-26 DIAGNOSIS — F17200 Nicotine dependence, unspecified, uncomplicated: Secondary | ICD-10-CM | POA: Insufficient documentation

## 2022-05-26 DIAGNOSIS — R69 Illness, unspecified: Secondary | ICD-10-CM | POA: Diagnosis not present

## 2022-05-26 DIAGNOSIS — Z6838 Body mass index (BMI) 38.0-38.9, adult: Secondary | ICD-10-CM | POA: Diagnosis not present

## 2022-05-26 DIAGNOSIS — Z1211 Encounter for screening for malignant neoplasm of colon: Secondary | ICD-10-CM | POA: Insufficient documentation

## 2022-05-26 DIAGNOSIS — E669 Obesity, unspecified: Secondary | ICD-10-CM | POA: Diagnosis not present

## 2022-05-26 DIAGNOSIS — K5909 Other constipation: Secondary | ICD-10-CM

## 2022-05-26 HISTORY — PX: ESOPHAGOGASTRODUODENOSCOPY: SHX5428

## 2022-05-26 HISTORY — PX: COLONOSCOPY WITH PROPOFOL: SHX5780

## 2022-05-26 SURGERY — COLONOSCOPY WITH PROPOFOL
Anesthesia: General

## 2022-05-26 MED ORDER — DEXMEDETOMIDINE HCL IN NACL 200 MCG/50ML IV SOLN
INTRAVENOUS | Status: DC | PRN
  Administered 2022-05-26: 20 ug via INTRAVENOUS

## 2022-05-26 MED ORDER — LIDOCAINE HCL (PF) 2 % IJ SOLN
INTRAMUSCULAR | Status: AC
  Filled 2022-05-26: qty 5

## 2022-05-26 MED ORDER — DEXMEDETOMIDINE HCL IN NACL 80 MCG/20ML IV SOLN
INTRAVENOUS | Status: AC
  Filled 2022-05-26: qty 20

## 2022-05-26 MED ORDER — SODIUM CHLORIDE 0.9 % IV SOLN: INTRAVENOUS | Status: DC

## 2022-05-26 MED ORDER — PROPOFOL 1000 MG/100ML IV EMUL
INTRAVENOUS | Status: AC
  Filled 2022-05-26: qty 100

## 2022-05-26 MED ORDER — LIDOCAINE HCL (CARDIAC) PF 100 MG/5ML IV SOSY
PREFILLED_SYRINGE | INTRAVENOUS | Status: DC | PRN
  Administered 2022-05-26: 100 mg via INTRAVENOUS

## 2022-05-26 MED ORDER — PROPOFOL 500 MG/50ML IV EMUL
INTRAVENOUS | Status: DC | PRN
  Administered 2022-05-26: 150 ug/kg/min via INTRAVENOUS

## 2022-05-26 NOTE — Transfer of Care (Signed)
Immediate Anesthesia Transfer of Care Note  Patient: Timothy [name]  Procedure(s) Performed: COLONOSCOPY WITH PROPOFOL ESOPHAGOGASTRODUODENOSCOPY (EGD)  Patient Location: PACU  Anesthesia Type:General  Level of Consciousness: awake and sedated  Airway & Oxygen Therapy: Patient Spontanous Breathing and Patient connected to nasal cannula oxygen  Post-op Assessment: Report given to RN and Post -op Vital signs reviewed and stable  Post vital signs: Reviewed and stable  Last Vitals:  Vitals Value Taken Time  BP    Temp    Pulse    Resp    SpO2      Last Pain:  Vitals:   05/26/22 0817  TempSrc: Temporal  PainSc: 0-No pain         Complications: No notable events documented.

## 2022-05-26 NOTE — Anesthesia Preprocedure Evaluation (Addendum)
Anesthesia Evaluation  Patient identified by MRN, date of birth, ID band Patient awake    Reviewed: Allergy & Precautions, NPO status , Patient's Chart, lab work & pertinent test results  History of Anesthesia Complications Negative for: history of anesthetic complications  Airway Mallampati: III  TM Distance: >3 FB Neck ROM: Full    Dental no notable dental hx. (+) Teeth Intact   Pulmonary Current Smoker and Patient abstained from smoking.,    Pulmonary exam normal breath sounds clear to auscultation       Cardiovascular Exercise Tolerance: Good negative cardio ROS Normal cardiovascular exam Rhythm:Regular Rate:Normal     Neuro/Psych PSYCHIATRIC DISORDERS Bipolar Disorder negative neurological ROS     GI/Hepatic Neg liver ROS, GERD  ,  Endo/Other  negative endocrine ROSObesity   Renal/GU negative Renal ROS  negative genitourinary   Musculoskeletal negative musculoskeletal ROS (+)   Abdominal   Peds negative pediatric ROS (+)  Hematology negative hematology ROS (+)   Anesthesia Other Findings   Reproductive/Obstetrics negative OB ROS                        Anesthesia Physical Anesthesia Plan  ASA: 2  Anesthesia Plan: General   Post-op Pain Management: Minimal or no pain anticipated   Induction: Intravenous  PONV Risk Score and Plan: 1 and Propofol infusion and TIVA  Airway Management Planned: Natural Airway and Nasal Cannula  Additional Equipment:   Intra-op Plan:   Post-operative Plan:   Informed Consent: I have reviewed the patients History and Physical, chart, labs and discussed the procedure including the risks, benefits and alternatives for the proposed anesthesia with the patient or authorized representative who has indicated his/her understanding and acceptance.     Dental Advisory Given  Plan Discussed with: Anesthesiologist, CRNA and Surgeon  Anesthesia Plan  Comments: (Patient consented for risks of anesthesia including but not limited to:  - adverse reactions to medications - risk of airway placement if required - damage to eyes, teeth, lips or other oral mucosa - nerve damage due to positioning  - sore throat or hoarseness - Damage to heart, brain, nerves, lungs, other parts of body or loss of life  Patient voiced understanding.)       Anesthesia Quick Evaluation

## 2022-05-26 NOTE — Op Note (Signed)
[facility] [name] Procedure Date: 05/26/2022 9:00 AM MRN: [id number] Account #: [id number] Date of Birth: [birth year] Admit Type: Outpatient Age: 26 Room: [facility] ENDO ROOM 3 Gender: Male Note Status: Finalized Instrument Name: Jasper Riling [id number] Procedure:             Colonoscopy Indications:           Surveillance: Personal history of adenomatous polyps                         on last colonoscopy 3 years ago Providers:             [name] MD, MD Referring MD:          Jearld Fenton (Referring MD) Medicines:             Monitored Anesthesia Care Complications:         No immediate complications. Procedure:             Pre-Anesthesia Assessment:                        - Prior to the procedure, a History and Physical was                         performed, and patient medications, allergies and                         sensitivities were reviewed. The patient's tolerance                         of previous anesthesia was reviewed.                        - The risks and benefits of the procedure and the                         sedation options and risks were discussed with the                         patient. All questions were answered and informed                         consent was obtained.                        - ASA Grade Assessment: II - A patient with mild                         systemic disease.                        After obtaining informed consent, the colonoscope was                         passed under direct vision. Throughout the procedure,                         the patient's blood pressure, pulse, and oxygen                         saturations  were monitored continuously. The                         Colonoscope was introduced through the anus and                         advanced to the the cecum, identified by the                         appendiceal orifice. The colonoscopy was performed                          with ease. The patient tolerated the procedure well.                         The quality of the bowel preparation was excellent. Findings:      The perianal and digital rectal examinations were normal.      The entire examined colon appeared normal. Impression:            - The entire examined colon is normal.                        - No specimens collected. Recommendation:        - Discharge patient to home (with escort).                        - Resume previous diet.                        - Continue present medications.                        - Repeat colonoscopy in 5 years for surveillance.                        - Discharge patient to home (with escort). Procedure Code(s):     --- Professional ---                        [phone], Colonoscopy, flexible; diagnostic, including                         collection of specimen(s) by brushing or washing, when                         performed (separate procedure) Diagnosis Code(s):     --- Professional ---                        Z86.010, Personal history of colonic polyps CPT copyright 2019 American Medical Association. All rights reserved. The codes documented in this report are preliminary and upon coder review may  be revised to meet current compliance requirements. [name], MD [name] MD, MD 05/26/2022 9:35:02 AM This report has been signed electronically. Number of Addenda: 0 Note Initiated On: 05/26/2022 9:00 AM Scope Withdrawal Time: 0 hours 8 minutes 12 seconds  Total Procedure Duration: 0 hours 10 minutes 25 seconds  Estimated Blood Loss:  Estimated blood loss: none.      [facility]

## 2022-05-26 NOTE — Op Note (Signed)
[facility] Gastroenterology [name] Procedure Date: 05/26/2022 9:02 AM MRN: [id number] Account #: [id number] Date of Birth: 01/31/1996 Admit Type: Outpatient Age: 26 Room: [facility] ENDO ROOM 3 Gender: Male Note Status: Finalized Instrument Name: Upper Endoscope [id number] Procedure:             Upper GI endoscopy Indications:           Chest pain (non cardiac) Providers:             [name] MD, MD Referring MD:          Jearld Fenton (Referring MD) Medicines:             Monitored Anesthesia Care Complications:         No immediate complications. Procedure:             Pre-Anesthesia Assessment:                        - Prior to the procedure, a History and Physical was                         performed, and patient medications, allergies and                         sensitivities were reviewed. The patient's tolerance                         of previous anesthesia was reviewed.                        - The risks and benefits of the procedure and the                         sedation options and risks were discussed with the                         patient. All questions were answered and informed                         consent was obtained.                        - ASA Grade Assessment: II - A patient with mild                         systemic disease.                        After obtaining informed consent, the endoscope was                         passed under direct vision. Throughout the procedure,                         the patient's blood pressure, pulse, and oxygen                         saturations were monitored continuously. The Endoscope  was introduced through the mouth, and advanced to the                         third part of duodenum. The upper GI endoscopy was                         accomplished with ease. The patient tolerated the                         procedure well. Findings:      The examined  esophagus was normal. Biopsies were taken with a cold       forceps for histology.      The stomach was normal.      The cardia and gastric fundus were normal on retroflexion.      A single large sessile polyp with no bleeding was found in the duodenal       bulb. Biopsies were taken with a cold forceps for histology. Impression:            - Normal esophagus. Biopsied.                        - Normal stomach.                        - A single duodenal polyp. Biopsied. Recommendation:        - Await pathology results.                        - Perform a colonoscopy today. Procedure Code(s):     --- Professional ---                        [phone], Esophagogastroduodenoscopy, flexible,                         transoral; with biopsy, single or multiple Diagnosis Code(s):     --- Professional ---                        K31.7, Polyp of stomach and duodenum                        R07.89, Other chest pain CPT copyright 2019 American Medical Association. All rights reserved. The codes documented in this report are preliminary and upon coder review may  be revised to meet current compliance requirements. [name], MD [name] MD, MD 05/26/2022 9:21:45 AM This report has been signed electronically. Number of Addenda: 0 Note Initiated On: 05/26/2022 9:02 AM Estimated Blood Loss:  Estimated blood loss: none.      [facility]

## 2022-05-26 NOTE — Anesthesia Postprocedure Evaluation (Signed)
Anesthesia Post Note  Patient: Timothy [name]  Procedure(s) Performed: COLONOSCOPY WITH PROPOFOL ESOPHAGOGASTRODUODENOSCOPY (EGD)  Patient location during evaluation: Endoscopy Anesthesia Type: General Level of consciousness: awake and alert Pain management: pain level controlled Vital Signs Assessment: post-procedure vital signs reviewed and stable Respiratory status: spontaneous breathing, nonlabored ventilation, respiratory function stable and patient connected to nasal cannula oxygen Cardiovascular status: blood pressure returned to baseline and stable Postop Assessment: no apparent nausea or vomiting Anesthetic complications: no   No notable events documented.   Last Vitals:  Vitals:   05/26/22 0949 05/26/22 0959  BP: (!) 97/56 103/67  Pulse: (!) 53 (!) 59  Resp: 16 16  Temp:    SpO2: 100% 97%    Last Pain:  Vitals:   05/26/22 0959  TempSrc:   PainSc: 0-No pain                 Ilene Qua

## 2022-05-26 NOTE — Anesthesia Procedure Notes (Signed)
Date/Time: 05/26/2022 9:14 AM  Performed by: Donalda Ewings, CindyPre-anesthesia Checklist: Patient identified, Suction available, Patient being monitored, Emergency Drugs available and Timeout performed Patient Re-evaluated:Patient Re-evaluated prior to induction Oxygen Delivery Method: Simple face mask Preoxygenation: Pre-oxygenation with 100% oxygen Induction Type: IV induction Airway Equipment and Method: Bite block Placement Confirmation: positive ETCO2 and CO2 detector

## 2022-05-26 NOTE — Progress Notes (Signed)
Patient discharged to home with his mother.  Spoke with the patient's mother by phone and gave verbal report for the Upper Endoscopy and the Colonoscopy performed by Dr Vicente Males.  The patient also has written reports and written discharge instructions, which was reviewed with the patient.  The patient states his abdominal cramping/discomfort has resolved and he is able to pass gas, abdomen remains soft, non-distended. Denies nausea, tolerated 16 oz of cola.  Discharged via wheelchair by volunteer.

## 2022-05-26 NOTE — H&P (Signed)
[name], MD [address], [place], [place], [place], [address] [id number], [place], [place], [place], [address] Phone: [phone]  Fax: [phone]  Primary Care Physician:  Jearld Fenton, NP   Pre-Procedure History & Physical: HPI:  Timothy [name] is a 26 y.o. male is here for an endoscopy and colonoscopy    Past Medical History:  Diagnosis Date   Allergy    Anxiety    Depression    Diverticulitis    Dyspnea    Fainting spell    in middle school   Family history of bone cancer    Family history of breast cancer    Hyperlipidemia     Past Surgical History:  Procedure Laterality Date   COLECTOMY WITH COLOSTOMY CREATION/HARTMANN PROCEDURE N/A 07/22/2018   Procedure: COLECTOMY WITH COLOSTOMY CREATION/HARTMANN PROCEDURE;  Surgeon: Fredirick Maudlin, MD;  Location: [facility] ORS;  Service: General;  Laterality: N/A;   COLONOSCOPY WITH PROPOFOL N/A 11/01/2018   Procedure: COLONOSCOPY WITH PROPOFOL;  Surgeon: [name], MD;  Location: [facility] ENDOSCOPY;  Service: Gastroenterology;  Laterality: N/A;   COLOSTOMY REVERSAL N/A 03/22/2019   Procedure: COLOSTOMY REVERSAL;  Surgeon: Fredirick Maudlin, MD;  Location: [facility] ORS;  Service: General;  Laterality: N/A;    Prior to Admission medications   Medication Sig Start Date End Date Taking? Authorizing Provider  hydrOXYzine (ATARAX) 10 MG/5ML syrup Take 25 mLs (50 mg total) by mouth 3 (three) times daily as needed for anxiety. Patient not taking: Reported on 04/18/2022 03/16/22 06/14/22  Jearld Fenton, NP  polyethylene glycol-electrolytes (NULYTELY) 420 g solution Prepare according to package instructions. Starting at 5:00 PM: Drink one 8 oz glass of mixture every 15 minutes until you finish half of the jug. Five hours prior to procedure, drink 8 oz glass of mixture every 15 minutes until it is all gone. Make sure you do not drink anything 4 hours prior to your procedure. 04/29/22   [name], MD    Allergies as of 04/29/2022    (No Known Allergies)    Family History  Problem Relation Age of Onset   Alcohol abuse Mother    Depression Mother    Cervical cancer Mother    Depression Father    Anxiety disorder Father    Healthy Sister    Alport syndrome Maternal Aunt    Heart disease Maternal Grandmother    Diabetes Maternal Grandmother    Cancer Maternal Grandmother        breast cancer   Alport syndrome Maternal Grandmother    Cancer Paternal Grandfather        Bone cancer    Social History   Socioeconomic History   Marital status: Single    Spouse name: Not on file   Number of children: Not on file   Years of education: Not on file   Highest education level: High school graduate  Occupational History   Not on file  Tobacco Use   Smoking status: Former    Packs/day: 0.25    Types: Cigarettes   Smokeless tobacco: Former    Types: Snuff   Tobacco comments:    pt smoke pack a week  Vaping Use   Vaping Use: Every day   Last attempt to quit: 09/09/2019   Substances: Nicotine, Flavoring  Substance and Sexual Activity   Alcohol use: Not Currently    Alcohol/week: 4.0 standard drinks of alcohol    Types: 4 Cans of beer per week    Comment:  occassionally   Drug use: No   Sexual activity: Not Currently  Other Topics Concern   Not on file  Social History Narrative   Not on file   Social Determinants of Health   Financial Resource Strain: Not on file  Food Insecurity: Not on file  Transportation Needs: Not on file  Physical Activity: Not on file  Stress: Not on file  Social Connections: Not on file  Intimate Partner Violence: Not on file    Review of Systems: See HPI, otherwise negative ROS  Physical Exam: BP 127/67   Pulse 68   Temp (!) 96.8 F (36 C) (Temporal)   Resp 20   Ht 6' (1.829 m)   Wt 120.2 kg   SpO2 98%   BMI 35.94 kg/m  General:   Alert,  pleasant and cooperative in NAD Head:  Normocephalic and atraumatic. Neck:  Supple; no masses or thyromegaly. Lungs:  Clear  throughout to auscultation, normal respiratory effort.    Heart:  +S1, +S2, Regular rate and rhythm, No edema. Abdomen:  Soft, nontender and nondistended. Normal bowel sounds, without guarding, and without rebound.   Neurologic:  Alert and  oriented x4;  grossly normal neurologically.  Impression/Plan: Timothy [name] is here for an endoscopy and colonoscopy  to be performed for  evaluation of chest pain non cardiac and history of colon polyps    Risks, benefits, limitations, and alternatives regarding endoscopy have been reviewed with the patient.  Questions have been answered.  All parties agreeable.   [name], MD  05/26/2022, 8:57 AM

## 2022-05-27 ENCOUNTER — Encounter: Payer: Self-pay | Admitting: Gastroenterology

## 2022-05-27 LAB — SURGICAL PATHOLOGY

## 2022-06-23 ENCOUNTER — Ambulatory Visit (INDEPENDENT_AMBULATORY_CARE_PROVIDER_SITE_OTHER): Payer: 59 | Admitting: Internal Medicine

## 2022-06-23 ENCOUNTER — Encounter: Payer: Self-pay | Admitting: Internal Medicine

## 2022-06-23 VITALS — BP 126/84 | HR 74 | Temp 97.1°F | Wt 261.0 lb

## 2022-06-23 DIAGNOSIS — R0789 Other chest pain: Secondary | ICD-10-CM | POA: Diagnosis not present

## 2022-06-23 DIAGNOSIS — F32A Depression, unspecified: Secondary | ICD-10-CM

## 2022-06-23 DIAGNOSIS — E781 Pure hyperglyceridemia: Secondary | ICD-10-CM | POA: Diagnosis not present

## 2022-06-23 DIAGNOSIS — Z6835 Body mass index (BMI) 35.0-35.9, adult: Secondary | ICD-10-CM

## 2022-06-23 DIAGNOSIS — K219 Gastro-esophageal reflux disease without esophagitis: Secondary | ICD-10-CM | POA: Diagnosis not present

## 2022-06-23 DIAGNOSIS — G4700 Insomnia, unspecified: Secondary | ICD-10-CM | POA: Insufficient documentation

## 2022-06-23 DIAGNOSIS — F419 Anxiety disorder, unspecified: Secondary | ICD-10-CM

## 2022-06-23 DIAGNOSIS — F5104 Psychophysiologic insomnia: Secondary | ICD-10-CM

## 2022-06-23 NOTE — Assessment & Plan Note (Signed)
Persistent No indication for cardiology follow up

## 2022-06-23 NOTE — Assessment & Plan Note (Signed)
He has an upcoming appt with psychiatry Support offered

## 2022-06-23 NOTE — Assessment & Plan Note (Signed)
Referral to nutritionist for further evaluation

## 2022-06-23 NOTE — Patient Instructions (Signed)
Nonspecific Chest Pain Chest pain can be caused by many different conditions. Some causes of chest pain can be life-threatening. These will require treatment right away. Serious causes of chest pain include: Heart attack. A tear in the body's main blood vessel. Redness and swelling (inflammation) around your heart. Blood clot in your lungs. Other causes of chest pain may not be so serious. These include: Heartburn. Anxiety or stress. Damage to bones or muscles in your chest. Lung infections. Chest pain can feel like: Pain or discomfort in your chest. Crushing, pressure, aching, or squeezing pain. Burning or tingling. Dull or sharp pain that is worse when you move, cough, or take a deep breath. Pain or discomfort that is also felt in your back, neck, jaw, shoulder, or arm, or pain that spreads to any of these areas. It is hard to know whether your pain is caused by something that is serious or something that is not so serious. So it is important to see your doctor right away if you have chest pain. Follow these instructions at home: Medicines Take over-the-counter and prescription medicines only as told by your doctor. If you were prescribed an antibiotic medicine, take it as told by your doctor. Do not stop taking the antibiotic even if you start to feel better. Lifestyle  Rest as told by your doctor. Do not use any products that contain nicotine or tobacco, such as cigarettes, e-cigarettes, and chewing tobacco. If you need help quitting, ask your doctor. Do not drink alcohol. Make lifestyle changes as told by your doctor. These may include: Getting regular exercise. Ask your doctor what activities are safe for you. Eating a heart-healthy diet. A diet and nutrition specialist (dietitian) can help you to learn healthy eating options. Staying at a healthy weight. Treating diabetes or high blood pressure, if needed. Lowering your stress. Activities such as yoga and relaxation techniques  can help. General instructions Pay attention to any changes in your symptoms. Tell your doctor about them or any new symptoms. Avoid any activities that cause chest pain. Keep all follow-up visits as told by your doctor. This is important. You may need more testing if your chest pain does not go away. Contact a doctor if: Your chest pain does not go away. You feel depressed. You have a fever. Get help right away if: Your chest pain is worse. You have a cough that gets worse, or you cough up blood. You have very bad (severe) pain in your belly (abdomen). You pass out (faint). You have either of these for no clear reason: Sudden chest discomfort. Sudden discomfort in your arms, back, neck, or jaw. You have shortness of breath at any time. You suddenly start to sweat, or your skin gets clammy. You feel sick to your stomach (nauseous). You throw up (vomit). You suddenly feel lightheaded or dizzy. You feel very weak or tired. Your heart starts to beat fast, or it feels like it is skipping beats. These symptoms may be an emergency. Do not wait to see if the symptoms will go away. Get medical help right away. Call your local emergency services (911 in the [place] Do not drive yourself to the hospital. Summary Chest pain can be caused by many different conditions. The cause may be serious and need treatment right away. If you have chest pain, see your doctor right away. Follow your doctor's instructions for taking medicines and making lifestyle changes. Keep all follow-up visits as told by your doctor. This includes visits for any further   testing if your chest pain does not go away. Be sure to know the signs that show that your condition has become worse. Get help right away if you have these symptoms. This information is not intended to replace advice given to you by your health care provider. Make sure you discuss any questions you have with your health care provider. Document Revised:  10/29/2020 Document Reviewed: 10/29/2020 Elsevier Patient Education  2023 Elsevier Inc.  

## 2022-06-23 NOTE — Assessment & Plan Note (Signed)
Has psychiatry appt pending Support offered

## 2022-06-23 NOTE — Assessment & Plan Note (Signed)
He refuses to take Omeprazole

## 2022-06-23 NOTE — Progress Notes (Signed)
Subjective:    Patient ID: Timothy [name], male    DOB: Jan 09, 1996, 26 y.o.   MRN: [id number]  HPI  Patient presents to clinic today with complaint of chest pain.  This has been an ongoing issue for him.  He has been seen in the ER multiple times for the same.  He has been worked up by cardiology, note from 02/2022 reviewed.  ECG at that time was normal.  Stress test from 03/2022 was normal.  Cardiologist felt that his chest pain was noncardiac.  They recommend he follow-up with GI for an EGD due to severe GERD.  GI note from 02/2022 reviewed, they recommended liquid Omeprazole.  Upper GI from 04/2022 showed a single duodenal polyp, otherwise no concerning inflammation within the stomach. He reports he still has persistent chest pain. It varies on the day and time that it comes. He has noticed that alcohol seems to make this better. He does feel like his chest pain is related to stress. He is seeing psychiatry but missed his last appt. He has not been started on any medication for this yet.  He would like a referral to a nutritionist. He is obese and he would like ways to eat better.  He also reports difficulty falling sleeping. He has been drinking alcohol at bedtime. He has an appt with psychiatry.  Review of Systems     Past Medical History:  Diagnosis Date   Allergy    Anxiety    Depression    Diverticulitis    Dyspnea    Fainting spell    in middle school   Family history of bone cancer    Family history of breast cancer    Hyperlipidemia     Current Outpatient Medications  Medication Sig Dispense Refill   polyethylene glycol-electrolytes (NULYTELY) 420 g solution Prepare according to package instructions. Starting at 5:00 PM: Drink one 8 oz glass of mixture every 15 minutes until you finish half of the jug. Five hours prior to procedure, drink 8 oz glass of mixture every 15 minutes until it is all gone. Make sure you do not drink anything 4 hours prior to your procedure. 4000 mL 0    No current facility-administered medications for this visit.    No Known Allergies  Family History  Problem Relation Age of Onset   Alcohol abuse Mother    Depression Mother    Cervical cancer Mother    Depression Father    Anxiety disorder Father    Healthy Sister    Alport syndrome Maternal Aunt    Heart disease Maternal Grandmother    Diabetes Maternal Grandmother    Cancer Maternal Grandmother        breast cancer   Alport syndrome Maternal Grandmother    Cancer Paternal Grandfather        Bone cancer    Social History   Socioeconomic History   Marital status: Single    Spouse name: Not on file   Number of children: Not on file   Years of education: Not on file   Highest education level: High school graduate  Occupational History   Not on file  Tobacco Use   Smoking status: Former    Packs/day: 0.25    Types: Cigarettes   Smokeless tobacco: Former    Types: Snuff   Tobacco comments:    pt smoke pack a week  Vaping Use   Vaping Use: Every day   Last attempt to quit: 09/09/2019  Substances: Nicotine, Flavoring  Substance and Sexual Activity   Alcohol use: Not Currently    Alcohol/week: 4.0 standard drinks of alcohol    Types: 4 Cans of beer per week    Comment: occassionally   Drug use: No   Sexual activity: Not Currently  Other Topics Concern   Not on file  Social History Narrative   Not on file   Social Determinants of Health   Financial Resource Strain: Not on file  Food Insecurity: Not on file  Transportation Needs: Not on file  Physical Activity: Not on file  Stress: Not on file  Social Connections: Not on file  Intimate Partner Violence: Not on file     Constitutional: Denies fever, malaise, fatigue, headache or abrupt weight changes.  HEENT: Denies eye pain, eye redness, ear pain, ringing in the ears, wax buildup, runny nose, nasal congestion, bloody nose, or sore throat. Respiratory: Denies difficulty breathing, shortness of breath,  cough or sputum production.   Cardiovascular: Patient reports chest pain.  Denies chest tightness, palpitations or swelling in the hands or feet.  Gastrointestinal: Patient reports reflux.  Denies abdominal pain, bloating, constipation, diarrhea or blood in the stool.  GU: Denies urgency, frequency, pain with urination, burning sensation, blood in urine, odor or discharge. Musculoskeletal: Denies decrease in range of motion, difficulty with gait, muscle pain or joint pain and swelling.  Skin: Denies redness, rashes, lesions or ulcercations.  Neurological: Patient reports insomnia.  Denies dizziness, difficulty with memory, difficulty with speech or problems with balance and coordination.  Psych: Patient has a history of anxiety depression.  Denies SI/HI.  No other specific complaints in a complete review of systems (except as listed in HPI above).  Objective:   Physical Exam  BP 126/84 (BP Location: Right Arm, Patient Position: Sitting, Cuff Size: Normal)   Pulse 74   Temp (!) 97.1 F (36.2 C) (Temporal)   Wt 261 lb (118.4 kg)   SpO2 99%   BMI 35.40 kg/m   Wt Readings from Last 3 Encounters:  05/26/22 265 lb (120.2 kg)  03/18/22 266 lb (120.7 kg)  03/16/22 264 lb (119.7 kg)    General: Appears his stated age, obese, in NAD. Skin: Warm, dry and intact.  Cardiovascular: Normal rate and rhythm. S1,S2 noted.  No murmur, rubs or gallops noted.  Pulmonary/Chest: Normal effort and positive vesicular breath sounds. No respiratory distress. No wheezes, rales or ronchi noted.  Abdomen: Soft and nontender. Normal bowel sounds. No distention or masses noted. Liver, spleen and kidneys non palpable. Neurological: Alert and oriented.  Psychiatric: Mood and affect normal. Anxious appearing. Judgment and thought content normal.    BMET    Component Value Date/Time   NA 141 03/14/2022 1905   NA 138 12/16/2014 0839   K 4.1 03/14/2022 1905   K 3.9 12/16/2014 0839   CL 108 03/14/2022 1905    CL 106 12/16/2014 0839   CO2 25 03/14/2022 1905   CO2 26 12/16/2014 0839   GLUCOSE 88 03/14/2022 1905   GLUCOSE 110 (H) 12/16/2014 0839   BUN 14 03/14/2022 1905   BUN 11 12/16/2014 0839   CREATININE 1.13 03/14/2022 1905   CREATININE 1.05 02/02/2022 1022   CALCIUM 9.3 03/14/2022 1905   CALCIUM 8.8 (L) 12/16/2014 0839   GFRNONAA >60 03/14/2022 1905   GFRNONAA >60 12/16/2014 0839   GFRAA >60 12/09/2019 0555   GFRAA >60 12/16/2014 0839    Lipid Panel     Component Value Date/Time   CHOL  165 02/02/2022 1022   TRIG 330 (H) 02/02/2022 1022   HDL 31 (L) 02/02/2022 1022   CHOLHDL 5.3 (H) 02/02/2022 1022   LDLCALC 90 02/02/2022 1022    CBC    Component Value Date/Time   WBC 9.4 03/14/2022 1905   RBC 5.03 03/14/2022 1905   HGB 14.8 03/14/2022 1905   HGB 14.6 12/16/2014 0839   HCT 44.6 03/14/2022 1905   HCT 44.0 12/16/2014 0839   PLT 240 03/14/2022 1905   PLT 233 12/16/2014 0839   MCV 88.7 03/14/2022 1905   MCV 88 12/16/2014 0839   MCH 29.4 03/14/2022 1905   MCHC 33.2 03/14/2022 1905   RDW 12.6 03/14/2022 1905   RDW 13.8 12/16/2014 0839   LYMPHSABS 3.0 03/13/2022 1854   LYMPHSABS 2.9 12/16/2014 0839   MONOABS 1.0 03/13/2022 1854   MONOABS 1.2 (H) 12/16/2014 0839   EOSABS 0.1 03/13/2022 1854   EOSABS 0.3 12/16/2014 0839   BASOSABS 0.0 03/13/2022 1854   BASOSABS 0.1 12/16/2014 0839    Hgb A1C Lab Results  Component Value Date   HGBA1C 5.6 02/02/2022            Assessment & Plan:    RTC in 7 months for your annual exam [name], NP

## 2022-06-27 ENCOUNTER — Encounter: Payer: Self-pay | Admitting: Internal Medicine

## 2022-06-27 DIAGNOSIS — R059 Cough, unspecified: Secondary | ICD-10-CM | POA: Diagnosis not present

## 2022-06-27 DIAGNOSIS — Z20822 Contact with and (suspected) exposure to covid-19: Secondary | ICD-10-CM | POA: Diagnosis not present

## 2022-06-27 DIAGNOSIS — J101 Influenza due to other identified influenza virus with other respiratory manifestations: Secondary | ICD-10-CM | POA: Diagnosis not present

## 2022-06-28 DIAGNOSIS — J111 Influenza due to unidentified influenza virus with other respiratory manifestations: Secondary | ICD-10-CM | POA: Diagnosis not present

## 2022-06-28 DIAGNOSIS — Z79899 Other long term (current) drug therapy: Secondary | ICD-10-CM | POA: Diagnosis not present

## 2022-06-28 DIAGNOSIS — R519 Headache, unspecified: Secondary | ICD-10-CM | POA: Diagnosis not present

## 2022-06-28 DIAGNOSIS — R110 Nausea: Secondary | ICD-10-CM | POA: Diagnosis not present

## 2022-06-28 DIAGNOSIS — E785 Hyperlipidemia, unspecified: Secondary | ICD-10-CM | POA: Diagnosis not present

## 2022-06-28 DIAGNOSIS — J101 Influenza due to other identified influenza virus with other respiratory manifestations: Secondary | ICD-10-CM | POA: Diagnosis not present

## 2022-06-28 DIAGNOSIS — M542 Cervicalgia: Secondary | ICD-10-CM | POA: Diagnosis not present

## 2022-06-28 DIAGNOSIS — G4700 Insomnia, unspecified: Secondary | ICD-10-CM | POA: Diagnosis not present

## 2022-06-28 DIAGNOSIS — S161XXA Strain of muscle, fascia and tendon at neck level, initial encounter: Secondary | ICD-10-CM | POA: Diagnosis not present

## 2022-06-28 DIAGNOSIS — R112 Nausea with vomiting, unspecified: Secondary | ICD-10-CM | POA: Diagnosis not present

## 2022-06-28 DIAGNOSIS — X58XXXA Exposure to other specified factors, initial encounter: Secondary | ICD-10-CM | POA: Diagnosis not present

## 2022-06-28 DIAGNOSIS — M25512 Pain in left shoulder: Secondary | ICD-10-CM | POA: Diagnosis not present

## 2022-06-28 DIAGNOSIS — R69 Illness, unspecified: Secondary | ICD-10-CM | POA: Diagnosis not present

## 2022-06-29 NOTE — Progress Notes (Deleted)
Patient ID: Timothy T Herbster, male   DOB: 07-28-1996, 26 y.o.   MRN: [id number]  Chief Complaint: Incisional hernia left abdominal wall  History of Present Illness Timothy [name] is a 26 y.o. 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  Minimally symptomatic, currently not having any significant pain.  He has progressed dropping 7 pounds and his weight loss attempts.  Still would like to have some nutritional/dietary counseling for future weight loss.  Appears highly motivated to continue with this either to defer hernia repair or to reduce the risks altogether.  He appears able to do about anything he wishes to with minimal discomfort in the left abdominal wall including weight lifting.  He denies pain with coughing or sneezing and denies any known bulge present.  Past Medical History Past Medical History:  Diagnosis Date   Allergy    Anxiety    Depression    Diverticulitis    Dyspnea    Fainting spell    in middle school   Family history of bone cancer    Family history of breast cancer    Hyperlipidemia       Past Surgical History:  Procedure Laterality Date   COLECTOMY WITH COLOSTOMY CREATION/HARTMANN PROCEDURE N/A 07/22/2018   Procedure: COLECTOMY WITH COLOSTOMY CREATION/HARTMANN PROCEDURE;  Surgeon: Fredirick Maudlin, MD;  Location: [facility] ORS;  Service: General;  Laterality: N/A;   COLONOSCOPY WITH PROPOFOL N/A 11/01/2018   Procedure: COLONOSCOPY WITH PROPOFOL;  Surgeon: [name], MD;  Location: [facility] ENDOSCOPY;  Service: Gastroenterology;  Laterality: N/A;   COLONOSCOPY WITH PROPOFOL N/A 05/26/2022   Procedure: COLONOSCOPY WITH PROPOFOL;  Surgeon: [name], MD;  Location: [facility] ENDOSCOPY;  Service: Gastroenterology;  Laterality: N/A;   COLOSTOMY REVERSAL N/A 03/22/2019   Procedure: COLOSTOMY REVERSAL;  Surgeon: Fredirick Maudlin, MD;  Location: [facility] ORS;  Service: General;  Laterality: N/A;   ESOPHAGOGASTRODUODENOSCOPY N/A 05/26/2022   Procedure:  ESOPHAGOGASTRODUODENOSCOPY (EGD);  Surgeon: [name], MD;  Location: [facility] ENDOSCOPY;  Service: Gastroenterology;  Laterality: N/A;    No Known Allergies  No current outpatient medications on file.   No current facility-administered medications for this visit.    Family History Family History  Problem Relation Age of Onset   Alcohol abuse Mother    Depression Mother    Cervical cancer Mother    Depression Father    Anxiety disorder Father    Healthy Sister    Alport syndrome Maternal Aunt    Heart disease Maternal Grandmother    Diabetes Maternal Grandmother    Cancer Maternal Grandmother        breast cancer   Alport syndrome Maternal Grandmother    Cancer Paternal Grandfather        Bone cancer      Social History Social History   Tobacco Use   Smoking status: Former    Packs/day: 0.25    Types: Cigarettes   Smokeless tobacco: Former    Types: Snuff   Tobacco comments:    pt smoke pack a week  Vaping Use   Vaping Use: Every day   Last attempt to quit: 09/09/2019   Substances: Nicotine, Flavoring  Substance Use Topics   Alcohol use: Not Currently    Alcohol/week: 4.0 standard drinks of alcohol    Types: 4 Cans of beer per week    Comment: occassionally   Drug use: No        Review of Systems  Constitutional: Negative.   HENT: Negative.  Eyes: Negative.   Respiratory: Negative.    Cardiovascular: Negative.   Gastrointestinal: Negative.   Genitourinary: Negative.   Skin: Negative.   Neurological: Negative.   Psychiatric/Behavioral: Negative.        Physical Exam There were no vitals taken for this visit.    CONSTITUTIONAL: Well developed, and nourished, appropriately responsive and aware without distress.   EYES: Sclera non-icteric.   EARS, NOSE, MOUTH AND THROAT: Mask worn.    Hearing is intact to voice.  NECK: Trachea is midline, and there is no jugular venous distension.  LYMPH NODES:  Lymph nodes in the neck are not  enlarged. RESPIRATORY:  Lungs are clear, and breath sounds are equal bilaterally. Normal respiratory effort without pathologic use of accessory muscles. CARDIOVASCULAR: Heart is regular in rate and rhythm. GI: The abdomen is soft, nontender, and nondistended.  There is an extended midline scar without appreciable fascial defect or bulge present.  There is a left upper quadrant scar consistent with a colostomy site there appears to be minimal fat present at the dermal fascial junction, with no appreciable bulge present.  I believe there may have been some healing by secondary intention.  There is no evidence of tenderness here either.  There were no palpable masses. I did not appreciate hepatosplenomegaly. There were normal bowel sounds. MUSCULOSKELETAL:  Symmetrical muscle tone appreciated in all four extremities.    SKIN: Skin turgor is normal. No pathologic skin lesions appreciated.  NEUROLOGIC:  Motor and sensation appear grossly normal.  Cranial nerves are grossly without defect. PSYCH:  Alert and oriented to person, place and time. Affect is appropriate for situation.  Data Reviewed I have personally reviewed what is currently available of the patient's imaging, recent labs and medical records.   Labs:     Latest Ref Rng & Units 03/14/2022    7:05 PM 03/13/2022    6:54 PM 03/12/2022   11:07 PM  CBC  WBC 4.0 - 10.5 K/uL 9.4  8.9  9.2   Hemoglobin 13.0 - 17.0 g/dL 14.8  14.4  15.1   Hematocrit 39.0 - 52.0 % 44.6  43.4  45.1   Platelets 150 - 400 K/uL 240  227  254       Latest Ref Rng & Units 03/14/2022    7:05 PM 03/13/2022    6:54 PM 03/12/2022   11:07 PM  CMP  Glucose 70 - 99 mg/dL 88  94  153   BUN 6 - 20 mg/dL '14  12  9   '$ Creatinine 0.61 - 1.24 mg/dL 1.13  0.97  1.07   Sodium 135 - 145 mmol/L 141  142  140   Potassium 3.5 - 5.1 mmol/L 4.1  4.1  3.6   Chloride 98 - 111 mmol/L 108  110  107   CO2 22 - 32 mmol/L '25  25  26   '$ Calcium 8.9 - 10.3 mg/dL 9.3  9.5  9.4   Total Protein  6.5 - 8.1 g/dL  7.5    Total Bilirubin 0.3 - 1.2 mg/dL  0.6    Alkaline Phos 38 - 126 U/L  62    AST 15 - 41 U/L  25    ALT 0 - 44 U/L  45        Imaging:  Within last 24 hrs: No results found.  Assessment    Incisional hernia.  Relatively asymptomatic, skin shows no evidence of distress, no appreciable bulge in the area. Patient Active Problem List  Diagnosis Date Noted   Hypertriglyceridemia 06/23/2022   Insomnia 06/23/2022   Non-cardiac chest pain    Leukocytosis 02/02/2022   Gastroesophageal reflux disease without esophagitis 01/25/2022   Class 2 obesity due to excess calories with body mass index (BMI) of 35.0 to 35.9 in adult 03/29/2021   Anxiety and depression 07/28/2017    Plan    We discussed the elective nature of this hernia repair, and I believe he can defer it as long as he would like.  I come alongside his plan to pursue weight loss and would like to provide any assistance is available.  We will be glad to revisit this in a year as long as he remains asymptomatic.  We will glad to see him sooner should it become more of a problem for him.  Face-to-face time spent with the patient and accompanying care providers(if present) was 30 minutes, with more than 50% of the time spent counseling, educating, and coordinating care of the patient.    These notes generated with voice recognition software. I apologize for typographical errors.  Ronny Bacon M.D., FACS 06/29/2022, 9:15 PM

## 2022-06-30 ENCOUNTER — Ambulatory Visit: Payer: 59 | Admitting: Surgery

## 2022-07-07 ENCOUNTER — Ambulatory Visit: Payer: 59 | Admitting: Surgery

## 2022-07-07 NOTE — Progress Notes (Deleted)
Patient ID: Timothy [name], male   DOB: September 14, 1995, 26 y.o.   MRN: [id number]  Chief Complaint: Incisional hernia left abdominal wall  History of Present Illness *** Last seen a year ago:  Timothy [name] is a 26 y.o. 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  Minimally symptomatic, currently not having any significant pain.  He has progressed dropping 7 pounds and his weight loss attempts.  Still would like to have some nutritional/dietary counseling for future weight loss.  Appears highly motivated to continue with this either to defer hernia repair or to reduce the risks altogether.  He appears able to do about anything he wishes to with minimal discomfort in the left abdominal wall including weight lifting.  He denies pain with coughing or sneezing and denies any known bulge present.  Past Medical History Past Medical History:  Diagnosis Date   Allergy    Anxiety    Depression    Diverticulitis    Dyspnea    Fainting spell    in middle school   Family history of bone cancer    Family history of breast cancer    Hyperlipidemia       Past Surgical History:  Procedure Laterality Date   COLECTOMY WITH COLOSTOMY CREATION/HARTMANN PROCEDURE N/A 07/22/2018   Procedure: COLECTOMY WITH COLOSTOMY CREATION/HARTMANN PROCEDURE;  Surgeon: Fredirick Maudlin, MD;  Location: [facility] ORS;  Service: General;  Laterality: N/A;   COLONOSCOPY WITH PROPOFOL N/A 11/01/2018   Procedure: COLONOSCOPY WITH PROPOFOL;  Surgeon: [name], MD;  Location: [facility] ENDOSCOPY;  Service: Gastroenterology;  Laterality: N/A;   COLONOSCOPY WITH PROPOFOL N/A 05/26/2022   Procedure: COLONOSCOPY WITH PROPOFOL;  Surgeon: [name], MD;  Location: [facility] ENDOSCOPY;  Service: Gastroenterology;  Laterality: N/A;   COLOSTOMY REVERSAL N/A 03/22/2019   Procedure: COLOSTOMY REVERSAL;  Surgeon: Fredirick Maudlin, MD;  Location: [facility] ORS;  Service: General;  Laterality: N/A;   ESOPHAGOGASTRODUODENOSCOPY  N/A 05/26/2022   Procedure: ESOPHAGOGASTRODUODENOSCOPY (EGD);  Surgeon: [name], MD;  Location: [facility] ENDOSCOPY;  Service: Gastroenterology;  Laterality: N/A;    No Known Allergies  No current outpatient medications on file.   No current facility-administered medications for this visit.    Family History Family History  Problem Relation Age of Onset   Alcohol abuse Mother    Depression Mother    Cervical cancer Mother    Depression Father    Anxiety disorder Father    Healthy Sister    Alport syndrome Maternal Aunt    Heart disease Maternal Grandmother    Diabetes Maternal Grandmother    Cancer Maternal Grandmother        breast cancer   Alport syndrome Maternal Grandmother    Cancer Paternal Grandfather        Bone cancer      Social History Social History   Tobacco Use   Smoking status: Former    Packs/day: 0.25    Types: Cigarettes   Smokeless tobacco: Former    Types: Snuff   Tobacco comments:    pt smoke pack a week  Vaping Use   Vaping Use: Every day   Last attempt to quit: 09/09/2019   Substances: Nicotine, Flavoring  Substance Use Topics   Alcohol use: Not Currently    Alcohol/week: 4.0 standard drinks of alcohol    Types: 4 Cans of beer per week    Comment: occassionally   Drug use: No        Review of Systems  Constitutional: Negative.   HENT: Negative.    Eyes: Negative.   Respiratory: Negative.    Cardiovascular: Negative.   Gastrointestinal: Negative.   Genitourinary: Negative.   Skin: Negative.   Neurological: Negative.   Psychiatric/Behavioral: Negative.        Physical Exam There were no vitals taken for this visit.    CONSTITUTIONAL: Well developed, and nourished, appropriately responsive and aware without distress.   EYES: Sclera non-icteric.   EARS, NOSE, MOUTH AND THROAT: Mask worn.    Hearing is intact to voice.  NECK: Trachea is midline, and there is no jugular venous distension.  LYMPH NODES:  Lymph nodes in the  neck are not enlarged. RESPIRATORY:  Lungs are clear, and breath sounds are equal bilaterally. Normal respiratory effort without pathologic use of accessory muscles. CARDIOVASCULAR: Heart is regular in rate and rhythm. GI: The abdomen is soft, nontender, and nondistended.  There is an extended midline scar without appreciable fascial defect or bulge present.  There is a left upper quadrant scar consistent with a colostomy site there appears to be minimal fat present at the dermal fascial junction, with no appreciable bulge present.  I believe there may have been some healing by secondary intention.  There is no evidence of tenderness here either.  There were no palpable masses. I did not appreciate hepatosplenomegaly. There were normal bowel sounds. MUSCULOSKELETAL:  Symmetrical muscle tone appreciated in all four extremities.    SKIN: Skin turgor is normal. No pathologic skin lesions appreciated.  NEUROLOGIC:  Motor and sensation appear grossly normal.  Cranial nerves are grossly without defect. PSYCH:  Alert and oriented to person, place and time. Affect is appropriate for situation.  Data Reviewed I have personally reviewed what is currently available of the patient's imaging, recent labs and medical records.   Labs:     Latest Ref Rng & Units 03/14/2022    7:05 PM 03/13/2022    6:54 PM 03/12/2022   11:07 PM  CBC  WBC 4.0 - 10.5 K/uL 9.4  8.9  9.2   Hemoglobin 13.0 - 17.0 g/dL 14.8  14.4  15.1   Hematocrit 39.0 - 52.0 % 44.6  43.4  45.1   Platelets 150 - 400 K/uL 240  227  254       Latest Ref Rng & Units 03/14/2022    7:05 PM 03/13/2022    6:54 PM 03/12/2022   11:07 PM  CMP  Glucose 70 - 99 mg/dL 88  94  153   BUN 6 - 20 mg/dL '14  12  9   '$ Creatinine 0.61 - 1.24 mg/dL 1.13  0.97  1.07   Sodium 135 - 145 mmol/L 141  142  140   Potassium 3.5 - 5.1 mmol/L 4.1  4.1  3.6   Chloride 98 - 111 mmol/L 108  110  107   CO2 22 - 32 mmol/L '25  25  26   '$ Calcium 8.9 - 10.3 mg/dL 9.3  9.5  9.4    Total Protein 6.5 - 8.1 g/dL  7.5    Total Bilirubin 0.3 - 1.2 mg/dL  0.6    Alkaline Phos 38 - 126 U/L  62    AST 15 - 41 U/L  25    ALT 0 - 44 U/L  45        Imaging:  Within last 24 hrs: No results found.  Assessment    Incisional hernia.  Relatively asymptomatic, skin shows no evidence of distress, no appreciable bulge  in the area. Patient Active Problem List   Diagnosis Date Noted   Hypertriglyceridemia 06/23/2022   Insomnia 06/23/2022   Non-cardiac chest pain    Leukocytosis 02/02/2022   Gastroesophageal reflux disease without esophagitis 01/25/2022   Class 2 obesity due to excess calories with body mass index (BMI) of 35.0 to 35.9 in adult 03/29/2021   Anxiety and depression 07/28/2017    Plan    We discussed the elective nature of this hernia repair, and I believe he can defer it as long as he would like.  I come alongside his plan to pursue weight loss and would like to provide any assistance is available.  We will be glad to revisit this in a year as long as he remains asymptomatic.  We will glad to see him sooner should it become more of a problem for him.  Face-to-face time spent with the patient and accompanying care providers(if present) was 30 minutes, with more than 50% of the time spent counseling, educating, and coordinating care of the patient.    These notes generated with voice recognition software. I apologize for typographical errors.  Ronny Bacon M.D., FACS 07/07/2022, 8:52 AM
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
## 2022-07-14 ENCOUNTER — Ambulatory Visit (INDEPENDENT_AMBULATORY_CARE_PROVIDER_SITE_OTHER): Payer: 59 | Admitting: Psychiatry

## 2022-07-14 ENCOUNTER — Encounter: Payer: Self-pay | Admitting: Psychiatry

## 2022-07-14 VITALS — BP 124/82 | Temp 98.7°F | Ht 72.0 in | Wt 255.8 lb

## 2022-07-14 DIAGNOSIS — Z789 Other specified health status: Secondary | ICD-10-CM

## 2022-07-14 DIAGNOSIS — F411 Generalized anxiety disorder: Secondary | ICD-10-CM | POA: Diagnosis not present

## 2022-07-14 DIAGNOSIS — R69 Illness, unspecified: Secondary | ICD-10-CM | POA: Diagnosis not present

## 2022-07-14 DIAGNOSIS — F330 Major depressive disorder, recurrent, mild: Secondary | ICD-10-CM | POA: Diagnosis not present

## 2022-07-14 MED ORDER — MIRTAZAPINE 15 MG PO TBDP: 15.0000 mg | ORAL_TABLET | Freq: Every day | ORAL | 2 refills | Status: DC

## 2022-07-14 NOTE — Patient Instructions (Signed)
Start mirtazapine 15 mg at night (dissolving tablet due to him having ) Referral to therapy Next appointment: 1/18 at 11:30

## 2022-07-18 DIAGNOSIS — Z79899 Other long term (current) drug therapy: Secondary | ICD-10-CM | POA: Diagnosis not present

## 2022-07-18 DIAGNOSIS — J029 Acute pharyngitis, unspecified: Secondary | ICD-10-CM | POA: Diagnosis not present

## 2022-07-18 DIAGNOSIS — R059 Cough, unspecified: Secondary | ICD-10-CM | POA: Diagnosis not present

## 2022-07-18 DIAGNOSIS — K219 Gastro-esophageal reflux disease without esophagitis: Secondary | ICD-10-CM | POA: Diagnosis not present

## 2022-07-18 DIAGNOSIS — Z20822 Contact with and (suspected) exposure to covid-19: Secondary | ICD-10-CM | POA: Diagnosis not present

## 2022-07-18 DIAGNOSIS — J069 Acute upper respiratory infection, unspecified: Secondary | ICD-10-CM | POA: Diagnosis not present

## 2022-07-18 DIAGNOSIS — R69 Illness, unspecified: Secondary | ICD-10-CM | POA: Diagnosis not present

## 2022-07-18 DIAGNOSIS — E785 Hyperlipidemia, unspecified: Secondary | ICD-10-CM | POA: Diagnosis not present

## 2022-07-18 DIAGNOSIS — R509 Fever, unspecified: Secondary | ICD-10-CM | POA: Diagnosis not present

## 2022-07-19 DIAGNOSIS — J069 Acute upper respiratory infection, unspecified: Secondary | ICD-10-CM | POA: Diagnosis not present

## 2022-07-19 DIAGNOSIS — R053 Chronic cough: Secondary | ICD-10-CM | POA: Diagnosis not present

## 2022-07-28 ENCOUNTER — Other Ambulatory Visit: Payer: Self-pay

## 2022-07-28 ENCOUNTER — Ambulatory Visit (INDEPENDENT_AMBULATORY_CARE_PROVIDER_SITE_OTHER): Payer: 59 | Admitting: Internal Medicine

## 2022-07-28 ENCOUNTER — Encounter: Payer: Self-pay | Admitting: Internal Medicine

## 2022-07-28 VITALS — BP 119/63 | HR 92 | Temp 97.8°F | Wt 263.0 lb

## 2022-07-28 DIAGNOSIS — R202 Paresthesia of skin: Secondary | ICD-10-CM

## 2022-07-28 DIAGNOSIS — J069 Acute upper respiratory infection, unspecified: Secondary | ICD-10-CM | POA: Diagnosis not present

## 2022-07-28 DIAGNOSIS — R200 Anesthesia of skin: Secondary | ICD-10-CM

## 2022-07-28 DIAGNOSIS — E162 Hypoglycemia, unspecified: Secondary | ICD-10-CM

## 2022-07-28 DIAGNOSIS — E781 Pure hyperglyceridemia: Secondary | ICD-10-CM

## 2022-07-28 NOTE — Progress Notes (Signed)
Subjective:    Patient ID: Timothy [name], male    DOB: 1995-12-14, 26 y.o.   MRN: [id number]  HPI  Patient presents to clinic today with complaint of low blood sugar. He reports this occurred over the weekend. He reports he was feeling was increased hungry, weak and "felt like I was about to have an anxiety attack". He reports he had not eaten in probably 5 hours. He reports some associated numbness in his hands and tingling in his feet. He did take some glucagon and reports he did feel better. He has no history of diabetes.   Of note, he was recently seen in the ER with complaint of cough and a fever.  His chest x-ray was negative.  Strep test was negative.  He was diagnosed with a viral URI and advised to treat symptomatically.  Review of Systems     Past Medical History:  Diagnosis Date   Allergy    Anxiety    Depression    Diverticulitis    Dyspnea    Fainting spell    in middle school   Family history of bone cancer    Family history of breast cancer    Hyperlipidemia     Current Outpatient Medications  Medication Sig Dispense Refill   mirtazapine (REMERON SOL-TAB) 15 MG disintegrating tablet Take 1 tablet (15 mg total) by mouth at bedtime. 30 tablet 2   No current facility-administered medications for this visit.    No Known Allergies  Family History  Problem Relation Age of Onset   Alcohol abuse Mother    Depression Mother    Cervical cancer Mother    Depression Father    Anxiety disorder Father    Healthy Sister    Alport syndrome Maternal Aunt    Heart disease Maternal Grandmother    Diabetes Maternal Grandmother    Cancer Maternal Grandmother        breast cancer   Alport syndrome Maternal Grandmother    Cancer Paternal Grandfather        Bone cancer    Social History   Socioeconomic History   Marital status: Single    Spouse name: Not on file   Number of children: Not on file   Years of education: Not on file   Highest education level: High  school graduate  Occupational History   Not on file  Tobacco Use   Smoking status: Former    Packs/day: 0.25    Types: Cigarettes   Smokeless tobacco: Former    Types: Snuff   Tobacco comments:    pt smoke pack a week  Vaping Use   Vaping Use: Every day   Last attempt to quit: 09/09/2019   Substances: Nicotine, Flavoring  Substance and Sexual Activity   Alcohol use: Not Currently    Alcohol/week: 4.0 standard drinks of alcohol    Types: 4 Cans of beer per week    Comment: occassionally   Drug use: No   Sexual activity: Not Currently  Other Topics Concern   Not on file  Social History Narrative   Not on file   Social Determinants of Health   Financial Resource Strain: Not on file  Food Insecurity: Not on file  Transportation Needs: Not on file  Physical Activity: Not on file  Stress: Not on file  Social Connections: Not on file  Intimate Partner Violence: Not on file     Constitutional: Pt reports fatigue. Denies fever, malaise, headache or abrupt weight changes.  HEENT: Denies eye pain, eye redness, ear pain, ringing in the ears, wax buildup, runny nose, nasal congestion, bloody nose, or sore throat. Respiratory: Denies difficulty breathing, shortness of breath, cough or sputum production.   Cardiovascular: Denies chest pain, chest tightness, palpitations or swelling in the hands or feet.  Gastrointestinal: Pt reports increased hunger. Denies abdominal pain, bloating, constipation, diarrhea or blood in the stool.  GU: Denies urgency, frequency, pain with urination, burning sensation, blood in urine, odor or discharge. Musculoskeletal: Pt reports generalized weakness. Denies decrease in range of motion, difficulty with gait, muscle pain or joint pain and swelling.  Skin: Denies redness, rashes, lesions or ulcercations.  Neurological: Pt reports numbness in hands, tingling in feet. Denies dizziness, difficulty with memory, difficulty with speech or problems with balance  and coordination.  Psych: Pt reports stress, has a history of anxiety. Denies depression, SI/HI.  No other specific complaints in a complete review of systems (except as listed in HPI above).  Objective:   Physical Exam   BP 119/63 (BP Location: Right Arm, Patient Position: Sitting, Cuff Size: Large)   Pulse 92   Temp 97.8 F (36.6 C) (Temporal)   Wt 263 lb (119.3 kg)   SpO2 98%   BMI 35.67 kg/m   Wt Readings from Last 3 Encounters:  06/23/22 261 lb (118.4 kg)  05/26/22 265 lb (120.2 kg)  03/18/22 266 lb (120.7 kg)    General: Appears his stated age, obese, in NAD. Skin: Warm, dry and intact. No rashes noted. HEENT: Head: normal shape and size; Eyes: sclera white, no icterus, conjunctiva pink, PERRLA and EOMs intact;  Cardiovascular: Normal rate and rhythm. S1,S2 noted.  No murmur, rubs or gallops noted.  Pulmonary/Chest: Normal effort and positive vesicular breath sounds. No respiratory distress. No wheezes, rales or ronchi noted.  Abdomen: Normal bowel sounds.  Musculoskeletal: No difficulty with gait.  Neurological: Alert and oriented.  Psychiatric: Mood and affect normal. Mildly anxious appearing. Judgment and thought content normal.    BMET    Component Value Date/Time   NA 141 03/14/2022 1905   NA 138 12/16/2014 0839   K 4.1 03/14/2022 1905   K 3.9 12/16/2014 0839   CL 108 03/14/2022 1905   CL 106 12/16/2014 0839   CO2 25 03/14/2022 1905   CO2 26 12/16/2014 0839   GLUCOSE 88 03/14/2022 1905   GLUCOSE 110 (H) 12/16/2014 0839   BUN 14 03/14/2022 1905   BUN 11 12/16/2014 0839   CREATININE 1.13 03/14/2022 1905   CREATININE 1.05 02/02/2022 1022   CALCIUM 9.3 03/14/2022 1905   CALCIUM 8.8 (L) 12/16/2014 0839   GFRNONAA >60 03/14/2022 1905   GFRNONAA >60 12/16/2014 0839   GFRAA >60 12/09/2019 0555   GFRAA >60 12/16/2014 0839    Lipid Panel     Component Value Date/Time   CHOL 165 02/02/2022 1022   TRIG 330 (H) 02/02/2022 1022   HDL 31 (L) 02/02/2022  1022   CHOLHDL 5.3 (H) 02/02/2022 1022   LDLCALC 90 02/02/2022 1022    CBC    Component Value Date/Time   WBC 9.4 03/14/2022 1905   RBC 5.03 03/14/2022 1905   HGB 14.8 03/14/2022 1905   HGB 14.6 12/16/2014 0839   HCT 44.6 03/14/2022 1905   HCT 44.0 12/16/2014 0839   PLT 240 03/14/2022 1905   PLT 233 12/16/2014 0839   MCV 88.7 03/14/2022 1905   MCV 88 12/16/2014 0839   MCH 29.4 03/14/2022 1905   MCHC 33.2 03/14/2022 1905  RDW 12.6 03/14/2022 1905   RDW 13.8 12/16/2014 0839   LYMPHSABS 3.0 03/13/2022 1854   LYMPHSABS 2.9 12/16/2014 0839   MONOABS 1.0 03/13/2022 1854   MONOABS 1.2 (H) 12/16/2014 0839   EOSABS 0.1 03/13/2022 1854   EOSABS 0.3 12/16/2014 0839   BASOSABS 0.0 03/13/2022 1854   BASOSABS 0.1 12/16/2014 0839    Hgb A1C Lab Results  Component Value Date   HGBA1C 5.6 02/02/2022           Assessment & Plan:   ER Follow Up for Viral URI:  ER notes, labs and imaging reviewed He is feeling better from that aspect Encourage rest and fluids  Hypoglycemia, Numbness in Hands, Tingling of Feet:  Encouraged him to eat every 4 hours to avoid hypoglycemia We will check c-Met, A1c, vitamin D and B12  RTC in 7 months for your annual exam [name], NP

## 2022-07-28 NOTE — Patient Instructions (Signed)
Hypoglycemia Hypoglycemia is when the sugar (glucose) level in your blood is too low. Low blood sugar can happen to people who have diabetes and people who do not have diabetes. Low blood sugar can happen quickly, and it can be an emergency. What are the causes? This condition happens most often in people who have diabetes. It may be caused by: Diabetes medicine. Not eating enough, or not eating often enough. Doing more physical activity. Drinking alcohol on an empty stomach. If you do not have diabetes, this condition may be caused by: A tumor in the pancreas. Not eating enough, or not eating for long periods at a time (fasting). A very bad infection or illness. Problems after having weight loss (bariatric) surgery. Kidney failure or liver failure. Certain medicines. What increases the risk? This condition is more likely to develop in people who: Have diabetes and take medicines to lower their blood sugar. Abuse alcohol. Have a very bad illness. What are the signs or symptoms? Mild Hunger. Sweating and feeling clammy. Feeling dizzy or light-headed. Being sleepy or having trouble sleeping. Feeling like you may vomit (nauseous). A fast heartbeat. A headache. Blurry vision. Mood changes, such as: Being grouchy. Feeling worried or nervous (anxious). Tingling or loss of feeling (numbness) around your mouth, lips, or tongue. Moderate Confusion and poor judgment. Behavior changes. Weakness. Uneven heartbeat. Trouble with moving (coordination). Very low Very low blood sugar (severe hypoglycemia) is a medical emergency. It can cause: Fainting. Seizures. Loss of consciousness (coma). Death. How is this treated? Treating low blood sugar Low blood sugar is often treated by eating or drinking something that has sugar in it right away. The food or drink should contain 15 grams of a fast-acting carb (carbohydrate). Options include: 4 oz (120 mL) of fruit juice. 4 oz (120 mL) of  regular soda (not diet soda). A few pieces of hard candy. Check food labels to see how many pieces to eat for 15 grams. 1 Tbsp (15 mL) of sugar or honey. 4 glucose tablets. 1 tube of glucose gel. Treating low blood sugar if you have diabetes If you can think clearly and swallow safely, follow the 15:15 rule: Take 15 grams of a fast-acting carb. Talk with your doctor about how much you should take. Always keep a source of fast-acting carb with you, such as: Glucose tablets (take 4 tablets). A few pieces of hard candy. Check food labels to see how many pieces to eat for 15 grams. 4 oz (120 mL) of fruit juice. 4 oz (120 mL) of regular soda (not diet soda). 1 Tbsp (15 mL) of honey or sugar. 1 tube of glucose gel. Check your blood sugar 15 minutes after you take the carb. If your blood sugar is still at or below 70 mg/dL (3.9 mmol/L), take 15 grams of a carb again. If your blood sugar does not go above 70 mg/dL (3.9 mmol/L) after 3 tries, get help right away. After your blood sugar goes back to normal, eat a meal or a snack within 1 hour.  Treating very low blood sugar If your blood sugar is below 54 mg/dL (3 mmol/L), you have very low blood sugar, or severe hypoglycemia. This is an emergency. Get medical help right away. If you have very low blood sugar and you cannot eat or drink, you will need to be given a hormone called glucagon. A family member or friend should learn how to check your blood sugar and how to give you glucagon. Ask your doctor if  you need to have an emergency glucagon kit at home. Very low blood sugar may also need to be treated in a hospital. Follow these instructions at home: General instructions Take over-the-counter and prescription medicines only as told by your doctor. Stay aware of your blood sugar as told by your doctor. If you drink alcohol: Limit how much you have to: 0-1 drink a day for women who are not pregnant. 0-2 drinks a day for men. Know how much  alcohol is in your drink. In the [place] one drink equals one 12 oz bottle of beer (355 mL), one 5 oz glass of wine (148 mL), or one 1 oz glass of hard liquor (44 mL). Be sure to eat food when you drink alcohol. Know that your body absorbs alcohol quickly. This may lead to low blood sugar later. Be sure to keep checking your blood sugar. Keep all follow-up visits. If you have diabetes:  Always have a fast-acting carb (15 grams) with you to treat low blood sugar. Follow your diabetes care plan as told by your doctor. Make sure you: Know the symptoms of low blood sugar. Check your blood sugar as often as told. Always check it before and after exercise. Always check your blood sugar before you drive. Take your medicines as told. Follow your meal plan. Eat on time. Do not skip meals. Share your diabetes care plan with: Your work or school. People you live with. Carry a card or wear jewelry that says you have diabetes. Where to find more information American Diabetes Association: www.diabetes.org Contact a doctor if: You have trouble keeping your blood sugar in your target range. You have low blood sugar often. Get help right away if: You still have symptoms after you eat or drink something that contains 15 grams of fast-acting carb, and you cannot get your blood sugar above 70 mg/dL by following the 15:15 rule. Your blood sugar is below 54 mg/dL (3 mmol/L). You have a seizure. You faint. These symptoms may be an emergency. Get help right away. Call your local emergency services (911 in the [place] Do not wait to see if the symptoms will go away. Do not drive yourself to the hospital. Summary Hypoglycemia happens when the level of sugar (glucose) in your blood is too low. Low blood sugar can happen to people who have diabetes and people who do not have diabetes. Low blood sugar can happen quickly, and it can be an emergency. Make sure you know the symptoms of low blood sugar and know how  to treat it. Always keep a source of sugar (fast-acting carb) with you to treat low blood sugar. This information is not intended to replace advice given to you by your health care provider. Make sure you discuss any questions you have with your health care provider. Document Revised: 07/16/2020 Document Reviewed: 07/16/2020 Elsevier Patient Education  2023 Elsevier Inc.  

## 2022-07-29 ENCOUNTER — Other Ambulatory Visit: Payer: 59

## 2022-07-29 DIAGNOSIS — R200 Anesthesia of skin: Secondary | ICD-10-CM | POA: Diagnosis not present

## 2022-07-29 DIAGNOSIS — E162 Hypoglycemia, unspecified: Secondary | ICD-10-CM | POA: Diagnosis not present

## 2022-07-29 DIAGNOSIS — E781 Pure hyperglyceridemia: Secondary | ICD-10-CM | POA: Diagnosis not present

## 2022-07-29 DIAGNOSIS — E559 Vitamin D deficiency, unspecified: Secondary | ICD-10-CM | POA: Diagnosis not present

## 2022-07-29 DIAGNOSIS — R202 Paresthesia of skin: Secondary | ICD-10-CM | POA: Diagnosis not present

## 2022-07-30 LAB — VITAMIN B12: Vitamin B-12: 544 pg/mL (ref 200–1100)

## 2022-07-30 LAB — HEMOGLOBIN A1C
Hgb A1c MFr Bld: 5.6 % of total Hgb (ref <5.7)
Mean Plasma Glucose: 114 mg/dL
eAG (mmol/L): 6.3 mmol/L

## 2022-07-30 LAB — LIPID PANEL
Cholesterol: 160 mg/dL (ref <200)
HDL: 35 mg/dL — ABNORMAL LOW (ref >=40)
LDL Cholesterol (Calc): 99 mg/dL (calc)
Non-HDL Cholesterol (Calc): 125 mg/dL (calc) (ref <130)
Total CHOL/HDL Ratio: 4.6 (calc) (ref <5.0)
Triglycerides: 166 mg/dL — ABNORMAL HIGH (ref <150)

## 2022-07-30 LAB — COMPLETE METABOLIC PANEL WITH GFR
AG Ratio: 1.8 (calc) (ref 1.0–2.5)
ALT: 27 U/L (ref 9–46)
AST: 20 U/L (ref 10–40)
Albumin: 4.8 g/dL (ref 3.6–5.1)
Alkaline phosphatase (APISO): 70 U/L (ref 36–130)
BUN: 19 mg/dL (ref 7–25)
CO2: 24 mmol/L (ref 20–32)
Calcium: 9.9 mg/dL (ref 8.6–10.3)
Chloride: 102 mmol/L (ref 98–110)
Creat: 0.97 mg/dL (ref 0.60–1.24)
Globulin: 2.7 g/dL (calc) (ref 1.9–3.7)
Glucose, Bld: 78 mg/dL (ref 65–99)
Potassium: 4.9 mmol/L (ref 3.5–5.3)
Sodium: 140 mmol/L (ref 135–146)
Total Bilirubin: 0.3 mg/dL (ref 0.2–1.2)
Total Protein: 7.5 g/dL (ref 6.1–8.1)
eGFR: 110 mL/min/{1.73_m2} (ref >=60)

## 2022-07-30 LAB — TEST AUTHORIZATION: TEST CODE:: 17306

## 2022-07-30 LAB — VITAMIN D 25 HYDROXY (VIT D DEFICIENCY, FRACTURES): Vit D, 25-Hydroxy: 16 ng/mL — ABNORMAL LOW (ref 30–100)

## 2022-08-02 DIAGNOSIS — Z20822 Contact with and (suspected) exposure to covid-19: Secondary | ICD-10-CM | POA: Diagnosis not present

## 2022-08-02 DIAGNOSIS — R42 Dizziness and giddiness: Secondary | ICD-10-CM | POA: Diagnosis not present

## 2022-09-09 ENCOUNTER — Ambulatory Visit: Payer: 59 | Admitting: Dietician
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
## 2022-09-15 ENCOUNTER — Ambulatory Visit: Payer: 59 | Admitting: Psychiatry

## 2022-09-30 ENCOUNTER — Encounter: Payer: Self-pay | Admitting: Emergency Medicine

## 2022-09-30 ENCOUNTER — Emergency Department
Admission: EM | Admit: 2022-09-30 | Discharge: 2022-09-30 | Disposition: A | Discharge status: Home / Self Care | Payer: 59 | Attending: Emergency Medicine | Admitting: Emergency Medicine

## 2022-09-30 ENCOUNTER — Ambulatory Visit: Payer: Self-pay

## 2022-09-30 ENCOUNTER — Emergency Department: Payer: 59

## 2022-09-30 ENCOUNTER — Other Ambulatory Visit: Payer: Self-pay

## 2022-09-30 DIAGNOSIS — R69 Illness, unspecified: Secondary | ICD-10-CM | POA: Diagnosis not present

## 2022-09-30 DIAGNOSIS — F419 Anxiety disorder, unspecified: Secondary | ICD-10-CM | POA: Diagnosis not present

## 2022-09-30 DIAGNOSIS — R079 Chest pain, unspecified: Secondary | ICD-10-CM

## 2022-09-30 DIAGNOSIS — R0789 Other chest pain: Secondary | ICD-10-CM | POA: Diagnosis not present

## 2022-09-30 LAB — BASIC METABOLIC PANEL
Anion gap: 8 (ref 5–15)
BUN: 12 mg/dL (ref 6–20)
CO2: 23 mmol/L (ref 22–32)
Calcium: 8.4 mg/dL — ABNORMAL LOW (ref 8.9–10.3)
Chloride: 108 mmol/L (ref 98–111)
Creatinine, Ser: 0.82 mg/dL (ref 0.61–1.24)
GFR, Estimated: >60 mL/min (ref >60)
Glucose, Bld: 91 mg/dL (ref 70–99)
Potassium: 3.6 mmol/L (ref 3.5–5.1)
Sodium: 139 mmol/L (ref 135–145)

## 2022-09-30 LAB — CBC
HCT: 44.2 % (ref 39.0–52.0)
Hemoglobin: 14.4 g/dL (ref 13.0–17.0)
MCH: 29 pg (ref 26.0–34.0)
MCHC: 32.6 g/dL (ref 30.0–36.0)
MCV: 88.9 fL (ref 80.0–100.0)
Platelets: 223 10*3/uL (ref 150–400)
RBC: 4.97 MIL/uL (ref 4.22–5.81)
RDW: 13 % (ref 11.5–15.5)
WBC: 9.4 10*3/uL (ref 4.0–10.5)
nRBC: 0 % (ref 0.0–0.2)

## 2022-09-30 LAB — HEPATIC FUNCTION PANEL
ALT: 30 U/L (ref 0–44)
AST: 29 U/L (ref 15–41)
Albumin: 3.9 g/dL (ref 3.5–5.0)
Alkaline Phosphatase: 61 U/L (ref 38–126)
Bilirubin, Direct: <0.1 mg/dL (ref 0.0–0.2)
Total Bilirubin: 0.5 mg/dL (ref 0.3–1.2)
Total Protein: 7 g/dL (ref 6.5–8.1)

## 2022-09-30 LAB — APTT: aPTT: 30 seconds (ref 24–36)

## 2022-09-30 LAB — LIPASE, BLOOD: Lipase: 36 U/L (ref 11–51)

## 2022-09-30 LAB — TROPONIN I (HIGH SENSITIVITY)
Troponin I (High Sensitivity): 4 ng/L (ref <18)
Troponin I (High Sensitivity): 3 ng/L (ref <18)

## 2022-09-30 LAB — PROTIME-INR
INR: 1.1 (ref 0.8–1.2)
Prothrombin Time: 14.3 seconds (ref 11.4–15.2)

## 2022-09-30 NOTE — ED Provider Notes (Addendum)
[facility] Provider Note    Event Date/Time   First MD Initiated Contact with Patient 09/30/22 1403     (approximate)   History   Chest Pain   HPI  Timothy [name] is a 27 y.o. male who comes in with left-sided chest pain.  Patient reports that he was trying to donate plasma this a.m. when he was stuck multiple times.  He then reports that they were having difficulties with his blood running through his machine with a blood was clotting per the nurse.  When he heard the word clotting blood he started to get very anxious thinking that he was having bunch of blood clots.  He then started to feel a significant mount of chest discomfort tingling in his left arm where the IV was and just overall not feeling well.  They then disconnected him from the system and he came here to the emergency room to be evaluated.  Patient does have a history of anxiety not currently on his medications.  Denies any risk factors for PE.  Patient reports resolution of symptoms at this time.  He does report that 4 days prior they had no issue with his blood donating plasma and there was no clotting issues at that time.  Physical Exam   Triage Vital Signs: ED Triage Vitals  Enc Vitals Group     BP 09/30/22 1259 128/76     Pulse Rate 09/30/22 1259 78     Resp 09/30/22 1259 18     Temp 09/30/22 1258 97.9 F (36.6 C)     Temp Source 09/30/22 1258 Oral     SpO2 09/30/22 1259 98 %     Weight --      Height --      Head Circumference --      Peak Flow --      Pain Score 09/30/22 1256 5     Pain Loc --      Pain Edu? --      Excl. in [place]? --     Most recent vital signs: Vitals:   09/30/22 1258 09/30/22 1259  BP:  128/76  Pulse:  78  Resp:  18  Temp: 97.9 F (36.6 C)   SpO2:  98%     General: Awake, no distress.  CV:  Good peripheral perfusion.  Resp:  Normal effort.  Abd:  No distention.  Other:  Good distal pulses DPs intact.  Radial pulses intact.  Ulnar pulse intact.   No numbness or tingling.  Full range of motion of extremities.  Abdomen is soft and nontender.  No chest wall tenderness. Normal cap refill fingers- no coolness noted.  ED Results / Procedures / Treatments   Labs (all labs ordered are listed, but only abnormal results are displayed) Labs Reviewed  BASIC METABOLIC PANEL - Abnormal; Notable for the following components:      Result Value   Calcium 8.4 (*)    All other components within normal limits  CBC  TROPONIN I (HIGH SENSITIVITY)     EKG  My interpretation of EKG:  Normal sinus rate of 82 without any ST elevation, slight T wave version lead III, normal intervals.  This looks similar to prior EKG in July 2023  RADIOLOGY I have reviewed the xray personally and interpreted no pneumonia   PROCEDURES:  Critical Care performed: No  Procedures   MEDICATIONS ORDERED IN ED: Medications - No data to display   IMPRESSION / MDM /  ASSESSMENT AND PLAN / ED COURSE  I reviewed the triage vital signs and the nursing notes.   Patient's presentation is most consistent with acute presentation with potential threat to life or bodily function.   Differential is ACS, vasovagal, anxiety.  Patient has good distal pulses in his hand.  Do not see any evidence of an arterial blood clot.  He has no shortness of breath and is PERC negative.  No swelling in the legs or calf tenderness to suggest DVT.  Unclear why patient had such difficulty with them getting his blood.  But I suspect patient had some hemolysis of the blood due to them having to manipulate the IV multiple times in order to try to get the blood flow.  Troponin was negative.  BMP normal.  CBC normal  Coags are normal liver test are normal.  Repeat troponin is negative.  As for his anxiety he denies any SI, HI.  No indication for IVC.  Explained to patient that he should follow-up with his psychiatry team to discuss his medications given has not been taking them due to them being  hard to swallow.  They may have other options including liquid formulations.  Expressed understanding and understands the importance of this.     FINAL CLINICAL IMPRESSION(S) / ED DIAGNOSES   Final diagnoses:  Chest pain, unspecified type  Anxiety     Rx / DC Orders   ED Discharge Orders     None        Note:  This document was prepared using Dragon voice recognition software and may include unintentional dictation errors.   Vanessa [place], MD 09/30/22 1600    Vanessa [place], MD 09/30/22 1600    Vanessa , MD 09/30/22 [phone]

## 2022-09-30 NOTE — Discharge Instructions (Addendum)
Avoid donating plasma in the future given he had so much difficulties Follow-up with his psychiatry team to discuss his anxiety medications.  Workup today was reassuring with normal platelets without signs of any clotting here and no signs of a heart attack

## 2022-09-30 NOTE — ED Triage Notes (Signed)
Patient to ED for left sided chest pain. Patient states he was trying to donate plasma this AM and was stuck multiple times. States he has been having lots of anxiety since with the chest pain. Suppose to be taking anxiety medication but "can't swallow pills."

## 2022-09-30 NOTE — Telephone Encounter (Signed)
  Chief Complaint: left sided chest pain and mid ches "shock like" feeling Symptoms: anxiety rate pain moderate Frequency: today at 1108 Pertinent Negatives: Patient denies dizziness, nausea, vomiting, sweating, fever, difficulty breathing, cough)  Disposition: '[x]'$ ED /'[]'$ Urgent Care (no appt availability in office) / '[]'$ Appointment(In office/virtual)/ '[]'$  [facility]/ '[]'$ Home Care/ '[]'$ Refused Recommended Disposition /'[]'$ [facility]/ '[]'$  Follow-up with PCP Additional Notes: pt was donating plasma and the he stated the nurses were having trouble with him clotting up on machine. Pt began having chest pain and the left arm (where the cannula was) became ice cold and his chest pain was severe.  Pt's mother did not think he needed to go to ED- advised if they go to Urgent Care they may turn them away to go to ED.  Reason for Disposition  Patient sounds very sick or weak to the triager    weak  Answer Assessment - Initial Assessment Questions 1. LOCATION: "Where does it hurt?"       Left side when hooked up left arm num felt like chest locking up Left side pain-blood clotting- took 4 to try to get him to donate 2. RADIATION: "Does the pain go anywhere else?" (e.g., into neck, jaw, arms, back)     Left arm 3. ONSET: "When did the chest pain begin?" (Minutes, hours or days)      During plasma- blood clots  4. PATTERN: "Does the pain come and go, or has it been constant since it started?"  "Does it get worse with exertion?"      Constant since hooked u not as bac  5. DURATION: "How long does it last" (e.g., seconds, minutes, hours)     *No Answer* 6. SEVERITY: "How bad is the pain?"  (e.g., Scale 1-10; mild, moderate, or severe)    - MILD (1-3): doesn't interfere with normal activities     - MODERATE (4-7): interferes with normal activities or awakens from sleep    - SEVERE (8-10): excruciating pain, unable to do any normal activities       5  7. CARDIAC RISK FACTORS: "Do you have  any history of heart problems or risk factors for heart disease?" (e.g., angina, prior heart attack; diabetes, high blood pressure, high cholesterol, smoker, or strong family history of heart disease)     no 8. PULMONARY RISK FACTORS: "Do you have any history of lung disease?"  (e.g., blood clots in lung, asthma, emphysema, birth control pills)     no 9. CAUSE: "What do you think is causing the chest pain?"     unsure 10. OTHER SYMPTOMS: "Do you have any other symptoms?" (e.g., dizziness, nausea, vomiting, sweating, fever, difficulty breathing, cough)       Anxiety- no  11. PREGNANCY: "Is there any chance you are pregnant?" "When was your last menstrual period?"       N/a  Protocols used: Chest Pain-A-AH

## 2022-10-03 DIAGNOSIS — Z20822 Contact with and (suspected) exposure to covid-19: Secondary | ICD-10-CM | POA: Diagnosis not present

## 2022-10-03 DIAGNOSIS — J111 Influenza due to unidentified influenza virus with other respiratory manifestations: Secondary | ICD-10-CM | POA: Diagnosis not present

## 2022-10-03 DIAGNOSIS — F17210 Nicotine dependence, cigarettes, uncomplicated: Secondary | ICD-10-CM | POA: Diagnosis not present

## 2022-10-03 DIAGNOSIS — R059 Cough, unspecified: Secondary | ICD-10-CM | POA: Diagnosis not present

## 2022-10-03 DIAGNOSIS — F32A Depression, unspecified: Secondary | ICD-10-CM | POA: Diagnosis not present

## 2022-10-03 DIAGNOSIS — K219 Gastro-esophageal reflux disease without esophagitis: Secondary | ICD-10-CM | POA: Diagnosis not present

## 2022-10-03 DIAGNOSIS — E785 Hyperlipidemia, unspecified: Secondary | ICD-10-CM | POA: Diagnosis not present

## 2022-10-03 DIAGNOSIS — R69 Illness, unspecified: Secondary | ICD-10-CM | POA: Diagnosis not present

## 2022-10-03 DIAGNOSIS — R112 Nausea with vomiting, unspecified: Secondary | ICD-10-CM | POA: Diagnosis not present

## 2022-10-03 DIAGNOSIS — Z1152 Encounter for screening for COVID-19: Secondary | ICD-10-CM | POA: Diagnosis not present

## 2022-10-03 DIAGNOSIS — R1110 Vomiting, unspecified: Secondary | ICD-10-CM | POA: Diagnosis not present

## 2022-10-03 DIAGNOSIS — R519 Headache, unspecified: Secondary | ICD-10-CM | POA: Diagnosis not present

## 2022-10-03 DIAGNOSIS — F419 Anxiety disorder, unspecified: Secondary | ICD-10-CM | POA: Diagnosis not present

## 2022-10-19 ENCOUNTER — Encounter: Payer: Self-pay | Admitting: Emergency Medicine

## 2022-10-19 ENCOUNTER — Emergency Department
Admission: EM | Admit: 2022-10-19 | Discharge: 2022-10-19 | Disposition: A | Discharge status: Home / Self Care | Payer: 59 | Attending: Emergency Medicine | Admitting: Emergency Medicine

## 2022-10-19 DIAGNOSIS — J029 Acute pharyngitis, unspecified: Secondary | ICD-10-CM | POA: Diagnosis not present

## 2022-10-19 DIAGNOSIS — Z20822 Contact with and (suspected) exposure to covid-19: Secondary | ICD-10-CM | POA: Insufficient documentation

## 2022-10-19 LAB — RESP PANEL BY RT-PCR (RSV, FLU A&B, COVID)  RVPGX2
Influenza A by PCR: NEGATIVE
Influenza B by PCR: NEGATIVE
Resp Syncytial Virus by PCR: NEGATIVE
SARS Coronavirus 2 by RT PCR: NEGATIVE

## 2022-10-19 LAB — GROUP A STREP BY PCR: Group A Strep by PCR: NOT DETECTED

## 2022-10-19 MED ORDER — ACETAMINOPHEN 160 MG/5ML PO SOLN
650.0000 mg | Freq: Once | ORAL | Status: AC
  Administered 2022-10-19: 650 mg via ORAL
  Filled 2022-10-19: qty 20.3

## 2022-10-19 MED ORDER — DEXAMETHASONE SODIUM PHOSPHATE 10 MG/ML IJ SOLN
10.0000 mg | Freq: Once | INTRAMUSCULAR | Status: AC
  Administered 2022-10-19: 10 mg via INTRAMUSCULAR
  Filled 2022-10-19: qty 1

## 2022-10-19 MED ORDER — ONDANSETRON 4 MG PO TBDP: 4.0000 mg | ORAL_TABLET | Freq: Three times a day (TID) | ORAL | 0 refills | Status: DC | PRN

## 2022-10-19 MED ORDER — KETOROLAC TROMETHAMINE 60 MG/2ML IM SOLN
60.0000 mg | Freq: Once | INTRAMUSCULAR | Status: AC
  Administered 2022-10-19: 60 mg via INTRAMUSCULAR
  Filled 2022-10-19: qty 2

## 2022-10-19 MED ORDER — ONDANSETRON 4 MG PO TBDP
4.0000 mg | ORAL_TABLET | Freq: Once | ORAL | Status: AC
  Administered 2022-10-19: 4 mg via ORAL
  Filled 2022-10-19: qty 1

## 2022-10-19 MED ORDER — HYDROCODONE BIT-HOMATROP MBR 5-1.5 MG/5ML PO SOLN: 5.0000 mL | Freq: Four times a day (QID) | ORAL | 0 refills | Status: DC | PRN

## 2022-10-19 MED ORDER — AMOXICILLIN-POT CLAVULANATE 600-42.9 MG/5ML PO SUSR: 875.0000 mg | Freq: Two times a day (BID) | ORAL | 0 refills | Status: AC

## 2022-10-19 NOTE — ED Triage Notes (Signed)
Pt reports fever, chills and sore throat since yesterday morning. Pt taking tylenol every 4 hours. Also having some n/v starting today.

## 2022-10-19 NOTE — ED Provider Notes (Signed)
[facility] Provider Note    Event Date/Time   First MD Initiated Contact with Patient 10/19/22 [phone]     (approximate)   History   Fever   HPI  Timothy [name] is a 27 y.o. male  here with sore throat, headache, fatigue. Pt reports that for the past 3 days he has had progressively worsening sore throat, bad breath, fatigue, and nausea with difficulty keeping food down. He has had a mild cough but no sob. He has known sick contacts at work with flu and strep. Denies any h/o recurrent lung issues, no SOB. He has nausea, pain but not difficulty with swallowing. No voice change.       Physical Exam   Triage Vital Signs: ED Triage Vitals  Enc Vitals Group     BP 10/19/22 0848 139/64     Pulse Rate 10/19/22 0848 (!) 105     Resp 10/19/22 0848 18     Temp 10/19/22 0848 (!) 100.7 F (38.2 C)     Temp Source 10/19/22 0848 Oral     SpO2 10/19/22 0848 94 %     Weight 10/19/22 0847 270 lb (122.5 kg)     Height 10/19/22 0847 6' (1.829 m)     Head Circumference --      Peak Flow --      Pain Score 10/19/22 0847 10     Pain Loc --      Pain Edu? --      Excl. in [place]? --     Most recent vital signs: Vitals:   10/19/22 0848  BP: 139/64  Pulse: (!) 105  Resp: 18  Temp: (!) 100.7 F (38.2 C)  SpO2: 94%     General: Awake, no distress.  CV:  Good peripheral perfusion.  Resp:  Normal work of breathing.  Abd:  No distention. No tenderness. No rebound or guarding. Other:  Posterior pharyngeal erythema, 3+ tonsillar swelling w exudates. Uvula is midline. No peritonsillar asymmetry. Normal wob. Tender anterior LN on left. Neck supple.   ED Results / Procedures / Treatments   Labs (all labs ordered are listed, but only abnormal results are displayed) Labs Reviewed  GROUP A STREP BY PCR  RESP PANEL BY RT-PCR (RSV, FLU A&B, COVID)  [id number]     EKG    RADIOLOGY    I also independently reviewed and agree with radiologist  interpretations.   PROCEDURES:  Critical Care performed: No   MEDICATIONS ORDERED IN ED: Medications  dexamethasone (DECADRON) injection 10 mg (has no administration in time range)  ketorolac (TORADOL) injection 60 mg (has no administration in time range)  acetaminophen (TYLENOL) 160 MG/5ML solution 650 mg (has no administration in time range)  ondansetron (ZOFRAN-ODT) disintegrating tablet 4 mg (4 mg Oral Given 10/19/22 0853)     IMPRESSION / MDM / ASSESSMENT AND PLAN / ED COURSE  I reviewed the triage vital signs and the nursing notes.                              Differential diagnosis includes, but is not limited to, pharyngitis, URI, CAP, mono, strep, allergic pharyngitis/throat irritation  Patient's presentation is most consistent with acute presentation with potential threat to life or bodily function.  27 yo  M here with sore throat, fevers, fatigue. Pt is HDS and well appearing. Exam is c/w bacterial pharyngitis, strep vs other. No signs of PTA, RPA, ludwig's  on exam. Pt is HDS. He is satting well on RA and lungs are clear. No meningismus or signs of meningitis. Airway is intact. Will d/c with empiric tx and pt was given dose of deadron here. Zofran for nausea. Encouraged fluids.   FINAL CLINICAL IMPRESSION(S) / ED DIAGNOSES   Final diagnoses:  Acute pharyngitis, unspecified etiology     Rx / DC Orders   ED Discharge Orders          Ordered    amoxicillin-clavulanate (AUGMENTIN ES-600) 600-42.9 MG/5ML suspension  2 times daily        10/19/22 1020    ondansetron (ZOFRAN-ODT) 4 MG disintegrating tablet  Every 8 hours PRN        10/19/22 1020    HYDROcodone bit-homatropine (HYCODAN) 5-1.5 MG/5ML syrup  Every 6 hours PRN        10/19/22 1020             Note:  This document was prepared using Dragon voice recognition software and may include unintentional dictation errors.   Duffy Bruce, MD 10/19/22 1021
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
## 2022-10-27 ENCOUNTER — Ambulatory Visit: Payer: 59 | Admitting: Psychiatry

## 2022-12-26 ENCOUNTER — Ambulatory Visit: Payer: Self-pay | Admitting: *Deleted

## 2022-12-26 ENCOUNTER — Telehealth: Payer: 59 | Admitting: Internal Medicine

## 2022-12-26 NOTE — Progress Notes (Deleted)
Virtual Visit via Video Note  I connected with Timothy [name] on 12/26/22 at 11:20 AM EDT by a video enabled telemedicine application and verified that I am speaking with the correct person using two identifiers.  Location: Patient: *** Provider: Office  Persons participating in this video call: [name], NP and Timothy [name]   I discussed the limitations of evaluation and management by telemedicine and the availability of in person appointments. The patient expressed understanding and agreed to proceed.  History of Present Illness:  Pt reports sore throat. This started.   Past Medical History:  Diagnosis Date   Allergy    Anxiety    Depression    Diverticulitis    Dyspnea    Fainting spell    in middle school   Family history of bone cancer    Family history of breast cancer    Hyperlipidemia     Current Outpatient Medications  Medication Sig Dispense Refill   HYDROcodone bit-homatropine (HYCODAN) 5-1.5 MG/5ML syrup Take 5 mLs by mouth every 6 (six) hours as needed for cough. 80 mL 0   ondansetron (ZOFRAN-ODT) 4 MG disintegrating tablet Take 1 tablet (4 mg total) by mouth every 8 (eight) hours as needed for vomiting or nausea. 20 tablet 0   No current facility-administered medications for this visit.    No Known Allergies  Family History  Problem Relation Age of Onset   Alcohol abuse Mother    Depression Mother    Cervical cancer Mother    Depression Father    Anxiety disorder Father    Healthy Sister    Alport syndrome Maternal Aunt    Heart disease Maternal Grandmother    Diabetes Maternal Grandmother    Cancer Maternal Grandmother        breast cancer   Alport syndrome Maternal Grandmother    Cancer Paternal Grandfather        Bone cancer    Social History   Socioeconomic History   Marital status: Single    Spouse name: Not on file   Number of children: Not on file   Years of education: Not on file   Highest education level: High school  graduate  Occupational History   Not on file  Tobacco Use   Smoking status: Former    Packs/day: .25    Types: Cigarettes   Smokeless tobacco: Former    Types: Snuff   Tobacco comments:    pt smoke pack a week  Vaping Use   Vaping Use: Every day   Last attempt to quit: 09/09/2019   Substances: Nicotine, Flavoring  Substance and Sexual Activity   Alcohol use: Not Currently    Alcohol/week: 4.0 standard drinks of alcohol    Types: 4 Cans of beer per week    Comment: occassionally   Drug use: No   Sexual activity: Not Currently  Other Topics Concern   Not on file  Social History Narrative   Not on file   Social Determinants of Health   Financial Resource Strain: Not on file  Food Insecurity: Not on file  Transportation Needs: Not on file  Physical Activity: Not on file  Stress: Not on file  Social Connections: Not on file  Intimate Partner Violence: Not on file     Constitutional: Denies fever, malaise, fatigue, headache or abrupt weight changes.  HEENT: Pt report sore throat. Denies eye pain, eye redness, ear pain, ringing in the ears, wax buildup, runny nose, nasal congestion, bloody nose. Respiratory: Denies  difficulty breathing, shortness of breath, cough or sputum production.   Cardiovascular: Denies chest pain, chest tightness, palpitations or swelling in the hands or feet.  Gastrointestinal: Denies abdominal pain, bloating, constipation, diarrhea or blood in the stool.  GU: Denies urgency, frequency, pain with urination, burning sensation, blood in urine, odor or discharge. Musculoskeletal: Denies decrease in range of motion, difficulty with gait, muscle pain or joint pain and swelling.  Skin: Denies redness, rashes, lesions or ulcercations.  Neurological: Denies dizziness, difficulty with memory, difficulty with speech or problems with balance and coordination.  Psych: Denies anxiety, depression, SI/HI.  No other specific complaints in a complete review of  systems (except as listed in HPI above).    Observations/Objective: There were no vitals taken for this visit. Wt Readings from Last 3 Encounters:  10/19/22 270 lb (122.5 kg)  09/30/22 263 lb 0.1 oz (119.3 kg)  07/28/22 263 lb (119.3 kg)    General: Appears his stated age, obese, in NAD. HEENT: Nose: ; Throat/Mouth:  Pulmonary/Chest: Normal effort. No respiratory distress.  Neurological: Alert and oriented.   BMET    Component Value Date/Time   NA 139 09/30/2022 1300   NA 138 12/16/2014 0839   K 3.6 09/30/2022 1300   K 3.9 12/16/2014 0839   CL 108 09/30/2022 1300   CL 106 12/16/2014 0839   CO2 23 09/30/2022 1300   CO2 26 12/16/2014 0839   GLUCOSE 91 09/30/2022 1300   GLUCOSE 110 (H) 12/16/2014 0839   BUN 12 09/30/2022 1300   BUN 11 12/16/2014 0839   CREATININE 0.82 09/30/2022 1300   CREATININE 0.97 07/29/2022 0905   CALCIUM 8.4 (L) 09/30/2022 1300   CALCIUM 8.8 (L) 12/16/2014 0839   GFRNONAA >60 09/30/2022 1300   GFRNONAA >60 12/16/2014 0839   GFRAA >60 12/09/2019 0555   GFRAA >60 12/16/2014 0839    Lipid Panel     Component Value Date/Time   CHOL 160 07/29/2022 0905   TRIG 166 (H) 07/29/2022 0905   HDL 35 (L) 07/29/2022 0905   CHOLHDL 4.6 07/29/2022 0905   LDLCALC 99 07/29/2022 0905    CBC    Component Value Date/Time   WBC 9.4 09/30/2022 1300   RBC 4.97 09/30/2022 1300   HGB 14.4 09/30/2022 1300   HGB 14.6 12/16/2014 0839   HCT 44.2 09/30/2022 1300   HCT 44.0 12/16/2014 0839   PLT 223 09/30/2022 1300   PLT 233 12/16/2014 0839   MCV 88.9 09/30/2022 1300   MCV 88 12/16/2014 0839   MCH 29.0 09/30/2022 1300   MCHC 32.6 09/30/2022 1300   RDW 13.0 09/30/2022 1300   RDW 13.8 12/16/2014 0839   LYMPHSABS 3.0 03/13/2022 1854   LYMPHSABS 2.9 12/16/2014 0839   MONOABS 1.0 03/13/2022 1854   MONOABS 1.2 (H) 12/16/2014 0839   EOSABS 0.1 03/13/2022 1854   EOSABS 0.3 12/16/2014 0839   BASOSABS 0.0 03/13/2022 1854   BASOSABS 0.1 12/16/2014 0839    Hgb  A1C Lab Results  Component Value Date   HGBA1C 5.6 07/29/2022        Assessment and Plan:  RTC in 2 months for annual exam.  Follow Up Instructions:    I discussed the assessment and treatment plan with the patient. The patient was provided an opportunity to ask questions and all were answered. The patient agreed with the plan and demonstrated an understanding of the instructions.   The patient was advised to call back or seek an in-person evaluation if the symptoms worsen or  if the condition fails to improve as anticipated.    [name], NP

## 2022-12-26 NOTE — Telephone Encounter (Signed)
Summary: Sore tonsil, cough, and cognestion Advice   Pt is calling to report that that he has been sick since last Tuesday. Missed his virtual today. No more appts today. Pt is interested in virtual appt today. Sx sore throat with sore tonsil, with cough, congestion         Reason for Disposition  [1] Pus on tonsils (back of throat) AND [2]  fever AND [3] swollen neck lymph nodes ("glands")  Answer Assessment - Initial Assessment Questions 1. ONSET: "When did the throat start hurting?" (Hours or days ago)      Last Tuesday- cough congestion 2. SEVERITY: "How bad is the sore throat?" (Scale 1-10; mild, moderate or severe)   - MILD (1-3):  Doesn't interfere with eating or normal activities.   - MODERATE (4-7): Interferes with eating some solids and normal activities.   - SEVERE (8-10):  Excruciating pain, interferes with most normal activities.   - SEVERE WITH DYSPHAGIA (10): Can't swallow liquids, drooling.     Only hurts with swallowing- 8.5, 4 otherwise 3. STREP EXPOSURE: "Has there been any exposure to strep within the past week?" If Yes, ask: "What type of contact occurred?"      No 4.  VIRAL SYMPTOMS: "Are there any symptoms of a cold, such as a runny nose, cough, hoarse voice or red eyes?"      Cough, nasal congestion 5. FEVER: "Do you have a fever?" If Yes, ask: "What is your temperature, how was it measured, and when did it start?"     no 6. PUS ON THE TONSILS: "Is there pus on the tonsils in the back of your throat?"     Back L side of throat- swelling 7. OTHER SYMPTOMS: "Do you have any other symptoms?" (e.g., difficulty breathing, headache, rash)     Headache - 2 days ago, bottom of gum- red with possible abscess  Protocols used: Sore Throat-A-AH

## 2022-12-26 NOTE — Telephone Encounter (Signed)
  Chief Complaint: sore throat Symptoms: sore throat- swollen tonsils Frequency: 1 week Pertinent Negatives: Patient denies fever, breathing difficulty  Disposition: [] ED /[] Urgent Care (no appt availability in office) / [x] Appointment(In office/virtual)/ []  [facility]/ [] Home Care/ [] Refused Recommended Disposition /[] [facility]/ []  Follow-up with PCP Additional Notes: Patient had virtual appointment today- but did not feell well and fell asleep- missed appointment. Patient states he has frequent strep and throat infections- he feels he may have tooth problem too. Appointment scheduled- he states he will not miss- because he will be coming after work

## 2022-12-27 ENCOUNTER — Encounter: Payer: Self-pay | Admitting: Internal Medicine

## 2022-12-27 ENCOUNTER — Ambulatory Visit (INDEPENDENT_AMBULATORY_CARE_PROVIDER_SITE_OTHER): Payer: Self-pay | Admitting: Internal Medicine

## 2022-12-27 VITALS — BP 124/86 | HR 92 | Temp 96.9°F | Wt 269.0 lb

## 2022-12-27 DIAGNOSIS — J029 Acute pharyngitis, unspecified: Secondary | ICD-10-CM

## 2022-12-27 DIAGNOSIS — K0889 Other specified disorders of teeth and supporting structures: Secondary | ICD-10-CM

## 2022-12-27 DIAGNOSIS — J301 Allergic rhinitis due to pollen: Secondary | ICD-10-CM

## 2022-12-27 LAB — POCT RAPID STREP A (OFFICE): Rapid Strep A Screen: NEGATIVE

## 2022-12-27 MED ORDER — FLUTICASONE PROPIONATE 50 MCG/ACT NA SUSP: 2.0000 | Freq: Every day | NASAL | 6 refills | Status: DC

## 2022-12-27 MED ORDER — CETIRIZINE HCL 5 MG/5ML PO SOLN: 10.0000 mg | Freq: Every day | ORAL | 0 refills | Status: DC

## 2022-12-27 NOTE — Progress Notes (Signed)
Subjective:    Patient ID: Timothy [name], male    DOB: [birth year], 27 y.o.   MRN: [id number]  HPI  Patient presents to clinic today with complaint of nasal congestion and sore throat.  This started 4 days ago.  He is not blowing any mucus out of his nose.  He has had some difficulty swallowing but this has improved.  He denies headache, runny nose, ear pain, cough, shortness of breath, nausea, vomiting or diarrhea.  He does have a molar that is coming and that is painful.  He denies fever, chills or body aches. He has not tried anything OTC for this. He was treated for pharyngitis 2/21 although strep test was negative at that time.  Review of Systems     Past Medical History:  Diagnosis Date   Allergy    Anxiety    Depression    Diverticulitis    Dyspnea    Fainting spell    in middle school   Family history of bone cancer    Family history of breast cancer    Hyperlipidemia     Current Outpatient Medications  Medication Sig Dispense Refill   HYDROcodone bit-homatropine (HYCODAN) 5-1.5 MG/5ML syrup Take 5 mLs by mouth every 6 (six) hours as needed for cough. 80 mL 0   ondansetron (ZOFRAN-ODT) 4 MG disintegrating tablet Take 1 tablet (4 mg total) by mouth every 8 (eight) hours as needed for vomiting or nausea. 20 tablet 0   No current facility-administered medications for this visit.    No Known Allergies  Family History  Problem Relation Age of Onset   Alcohol abuse Mother    Depression Mother    Cervical cancer Mother    Depression Father    Anxiety disorder Father    Healthy Sister    Alport syndrome Maternal Aunt    Heart disease Maternal Grandmother    Diabetes Maternal Grandmother    Cancer Maternal Grandmother        breast cancer   Alport syndrome Maternal Grandmother    Cancer Paternal Grandfather        Bone cancer    Social History   Socioeconomic History   Marital status: Single    Spouse name: Not on file   Number of children: Not on file    Years of education: Not on file   Highest education level: High school graduate  Occupational History   Not on file  Tobacco Use   Smoking status: Former    Packs/day: .25    Types: Cigarettes   Smokeless tobacco: Former    Types: Snuff   Tobacco comments:    pt smoke pack a week  Vaping Use   Vaping Use: Every day   Last attempt to quit: 09/09/2019   Substances: Nicotine, Flavoring  Substance and Sexual Activity   Alcohol use: Not Currently    Alcohol/week: 4.0 standard drinks of alcohol    Types: 4 Cans of beer per week    Comment: occassionally   Drug use: No   Sexual activity: Not Currently  Other Topics Concern   Not on file  Social History Narrative   Not on file   Social Determinants of Health   Financial Resource Strain: Not on file  Food Insecurity: Not on file  Transportation Needs: Not on file  Physical Activity: Not on file  Stress: Not on file  Social Connections: Not on file  Intimate Partner Violence: Not on file     Constitutional:  Denies fever, malaise, fatigue, headache or abrupt weight changes.  HEENT: Patient report nasal congestion and sore throat.  Denies eye pain, eye redness, ear pain, ringing in the ears, wax buildup, runny nose, bloody nose. Respiratory: Denies difficulty breathing, shortness of breath, cough or sputum production.   Cardiovascular: Denies chest pain, chest tightness, palpitations or swelling in the hands or feet.  Gastrointestinal: Denies abdominal pain, bloating, constipation, diarrhea or blood in the stool.   No other specific complaints in a complete review of systems (except as listed in HPI above).  Objective:   Physical Exam BP 124/86 (BP Location: Left Arm, Patient Position: Sitting, Cuff Size: Large)   Pulse 92   Temp (!) 96.9 F (36.1 C) (Temporal)   Wt 269 lb (122 kg)   SpO2 97%   BMI 36.48 kg/m   Wt Readings from Last 3 Encounters:  10/19/22 270 lb (122.5 kg)  09/30/22 263 lb 0.1 oz (119.3 kg)  07/28/22  263 lb (119.3 kg)    General: Appears his stated age, obese, in NAD. Skin: Warm, dry and intact.  HEENT: Head: normal shape and size; Eyes: sclera white, no icterus, conjunctiva pink, PERRLA and EOMs intact; Nose: mucosa pink and moist, septum midline; Throat/Mouth: Teeth present, mucosa pink and moist, tonsils 2+ without exudate, lesions or ulcerations noted.  Neck: No cervical adenopathy noted..  Cardiovascular: Normal rate and rhythm. S1,S2 noted.  No murmur, rubs or gallops noted.  Pulmonary/Chest: Normal effort and positive vesicular breath sounds. No respiratory distress. No wheezes, rales or ronchi noted.  Neurological: Alert and oriented.    BMET    Component Value Date/Time   NA 139 09/30/2022 1300   NA 138 12/16/2014 0839   K 3.6 09/30/2022 1300   K 3.9 12/16/2014 0839   CL 108 09/30/2022 1300   CL 106 12/16/2014 0839   CO2 23 09/30/2022 1300   CO2 26 12/16/2014 0839   GLUCOSE 91 09/30/2022 1300   GLUCOSE 110 (H) 12/16/2014 0839   BUN 12 09/30/2022 1300   BUN 11 12/16/2014 0839   CREATININE 0.82 09/30/2022 1300   CREATININE 0.97 07/29/2022 0905   CALCIUM 8.4 (L) 09/30/2022 1300   CALCIUM 8.8 (L) 12/16/2014 0839   GFRNONAA >60 09/30/2022 1300   GFRNONAA >60 12/16/2014 0839   GFRAA >60 12/09/2019 0555   GFRAA >60 12/16/2014 0839    Lipid Panel     Component Value Date/Time   CHOL 160 07/29/2022 0905   TRIG 166 (H) 07/29/2022 0905   HDL 35 (L) 07/29/2022 0905   CHOLHDL 4.6 07/29/2022 0905   LDLCALC 99 07/29/2022 0905    CBC    Component Value Date/Time   WBC 9.4 09/30/2022 1300   RBC 4.97 09/30/2022 1300   HGB 14.4 09/30/2022 1300   HGB 14.6 12/16/2014 0839   HCT 44.2 09/30/2022 1300   HCT 44.0 12/16/2014 0839   PLT 223 09/30/2022 1300   PLT 233 12/16/2014 0839   MCV 88.9 09/30/2022 1300   MCV 88 12/16/2014 0839   MCH 29.0 09/30/2022 1300   MCHC 32.6 09/30/2022 1300   RDW 13.0 09/30/2022 1300   RDW 13.8 12/16/2014 0839   LYMPHSABS 3.0 03/13/2022  1854   LYMPHSABS 2.9 12/16/2014 0839   MONOABS 1.0 03/13/2022 1854   MONOABS 1.2 (H) 12/16/2014 0839   EOSABS 0.1 03/13/2022 1854   EOSABS 0.3 12/16/2014 0839   BASOSABS 0.0 03/13/2022 1854   BASOSABS 0.1 12/16/2014 0839    Hgb A1C Lab Results  Component  Value Date   HGBA1C 5.6 07/29/2022            Assessment & Plan:   Nasal Congestion, Sore Throat:  Rapid strep negative Likely allergies Rx for Zyrtec 10 mg daily Rx for flu nasal 1 spray each nostril daily  Impacted Wisdom Tooth:  Does not appear to be infected Advised to call the [facility] dental school to see if they would be able to remove this for free order to reduce cost   RTC in 2 months for your annual exam [name], NP

## 2022-12-27 NOTE — Patient Instructions (Signed)
Allergic Rhinitis, Adult  Allergic rhinitis is a reaction to allergens. Allergens are things that can cause an allergic reaction. This condition affects the lining inside the nose (mucous membrane). There are two types of allergic rhinitis: Seasonal. This type is also called hay fever. It happens only during some times of the year. Perennial. This type can happen at any time of the year. This condition cannot be spread from person to person (is not contagious). It can be mild, bad, or very bad. It can develop at any age and may be outgrown. What are the causes? Pollen from grasses, trees, and weeds. Other causes can be: Dust mites. Smoke. Mold. Car fumes. The pee (urine), spit, or dander of pets. Dander is dead skin cells from a pet. What increases the risk? You are more likely to develop this condition if: You have allergies in your family. You have problems like allergies in your family. You may have: Swelling of parts of your eyes and eyelids. Asthma. This affects how you breathe. Long-term redness and swelling on your skin. Food allergies. What are the signs or symptoms? The main symptom of this condition is a runny or stuffy nose (nasal congestion). Other symptoms may include: Sneezing or coughing. Itching and tearing of your eyes. Mucus that drips down the back of your throat (postnasal drip). This may cause a sore throat. Trouble sleeping. Feeling tired. Headache. How is this treated? There is no cure for this condition. You should avoid things that you are allergic to. Treatment can help to relieve symptoms. This may include: Medicines that block allergy symptoms, such as anti-inflammatories or antihistamines. These may be given as a shot, nasal spray, or pill. Avoiding things you are allergic to. Medicines that give you some of what you are allergic to over time. This is called immunotherapy. It is done if other treatments do not help. You may get: Shots. Medicine under  your tongue. Stronger medicines, if other treatments do not help. Follow these instructions at home: Avoiding allergens Find out what things you are allergic to and avoid them. To do this, try these things: If you get allergies any time of year: Replace carpet with wood, tile, or vinyl flooring. Carpet can trap pet dander and dust. Do not smoke. Do not allow smoking in your home. Change your heating and air conditioning filters at least once a month. If you get allergies only some times of the year: Keep windows closed when you can. Plan things to do outside when pollen counts are lowest. Check pollen counts before you plan things to do outside. When you come indoors, change your clothes and shower before you sit on furniture or bedding. If you are allergic to a pet: Keep the pet out of your bedroom. Vacuum, sweep, and dust often. General instructions Take over-the-counter and prescription medicines only as told by your doctor. Drink enough fluid to keep your pee pale yellow. Where to find more information American Academy of Allergy, Asthma & Immunology: aaaai.org Contact a doctor if: You have a fever. You get a cough that does not go away. You make high-pitched whistling sounds when you breathe, most often when you breathe out (wheeze). Your symptoms slow you down. Your symptoms stop you from doing your normal things each day. Get help right away if: You are short of breath. This symptom may be an emergency. Get help right away. Call 911. Do not wait to see if the symptom will go away. Do not drive yourself to the   hospital. This information is not intended to replace advice given to you by your health care provider. Make sure you discuss any questions you have with your health care provider. Document Revised: 04/25/2022 Document Reviewed: 04/25/2022 Elsevier Patient Education  2023 Elsevier Inc.  

## 2023-02-21 ENCOUNTER — Emergency Department: Payer: 59

## 2023-02-21 ENCOUNTER — Other Ambulatory Visit: Payer: Self-pay

## 2023-02-21 ENCOUNTER — Emergency Department
Admission: EM | Admit: 2023-02-21 | Discharge: 2023-02-21 | Disposition: A | Discharge status: Home / Self Care | Payer: 59 | Attending: Emergency Medicine | Admitting: Emergency Medicine

## 2023-02-21 DIAGNOSIS — Z1152 Encounter for screening for COVID-19: Secondary | ICD-10-CM | POA: Insufficient documentation

## 2023-02-21 DIAGNOSIS — R079 Chest pain, unspecified: Secondary | ICD-10-CM

## 2023-02-21 DIAGNOSIS — R059 Cough, unspecified: Secondary | ICD-10-CM | POA: Insufficient documentation

## 2023-02-21 DIAGNOSIS — R0789 Other chest pain: Secondary | ICD-10-CM | POA: Diagnosis not present

## 2023-02-21 LAB — BASIC METABOLIC PANEL
Anion gap: 9 (ref 5–15)
BUN: 17 mg/dL (ref 6–20)
CO2: 24 mmol/L (ref 22–32)
Calcium: 9.1 mg/dL (ref 8.9–10.3)
Chloride: 103 mmol/L (ref 98–111)
Creatinine, Ser: 0.91 mg/dL (ref 0.61–1.24)
GFR, Estimated: >60 mL/min (ref >60)
Glucose, Bld: 91 mg/dL (ref 70–99)
Potassium: 3.8 mmol/L (ref 3.5–5.1)
Sodium: 136 mmol/L (ref 135–145)

## 2023-02-21 LAB — CBC
HCT: 44.5 % (ref 39.0–52.0)
Hemoglobin: 15 g/dL (ref 13.0–17.0)
MCH: 29.4 pg (ref 26.0–34.0)
MCHC: 33.7 g/dL (ref 30.0–36.0)
MCV: 87.3 fL (ref 80.0–100.0)
Platelets: 237 10*3/uL (ref 150–400)
RBC: 5.1 MIL/uL (ref 4.22–5.81)
RDW: 12.5 % (ref 11.5–15.5)
WBC: 9.1 10*3/uL (ref 4.0–10.5)
nRBC: 0 % (ref 0.0–0.2)

## 2023-02-21 LAB — LIPASE, BLOOD: Lipase: 34 U/L (ref 11–51)

## 2023-02-21 LAB — HEPATIC FUNCTION PANEL
ALT: 42 U/L (ref 0–44)
AST: 29 U/L (ref 15–41)
Albumin: 4.6 g/dL (ref 3.5–5.0)
Alkaline Phosphatase: 64 U/L (ref 38–126)
Bilirubin, Direct: <0.1 mg/dL (ref 0.0–0.2)
Total Bilirubin: 0.6 mg/dL (ref 0.3–1.2)
Total Protein: 7.9 g/dL (ref 6.5–8.1)

## 2023-02-21 LAB — SARS CORONAVIRUS 2 BY RT PCR: SARS Coronavirus 2 by RT PCR: NEGATIVE

## 2023-02-21 LAB — TROPONIN I (HIGH SENSITIVITY)
Troponin I (High Sensitivity): 5 ng/L (ref <18)
Troponin I (High Sensitivity): 4 ng/L (ref <18)

## 2023-02-21 MED ORDER — FAMOTIDINE 40 MG/5ML PO SUSR
20.0000 mg | Freq: Once | ORAL | Status: DC
  Filled 2023-02-21: qty 2.5

## 2023-02-21 MED ORDER — FAMOTIDINE 40 MG/5ML PO SUSR
20.0000 mg | Freq: Once | ORAL | Status: AC
  Administered 2023-02-21: 20 mg via ORAL
  Filled 2023-02-21: qty 2.5

## 2023-02-21 MED ORDER — ALUM & MAG HYDROXIDE-SIMETH 200-200-20 MG/5ML PO SUSP
30.0000 mL | Freq: Once | ORAL | Status: AC
  Administered 2023-02-21: 30 mL via ORAL
  Filled 2023-02-21: qty 30

## 2023-02-21 NOTE — ED Provider Notes (Signed)
[facility] Provider Note  Patient Contact: 4:02 PM (approximate)   History   Chest Pain   HPI  Timothy [name] is a 27 y.o. male with a history of anxiety, hyperlipidemia and depression, presents to the emergency department with chest discomfort that started around 8 AM.  Patient states that his chest pain originally presented as a pinching sensation along the left upper chest.  Patient states that pain went away and he went to get something to eat a diet Coke.  Patient states that he took a nap and when he awoke, he had some coughing and right-sided chest pain.  Patient states that he associates pain with acid reflux.  Patient has not taking any antiacid since symptoms started.  Has had no associated breathlessness and has not noticed any lower extremity swelling.  He denies any known cardiac issues in the past.  No fever or chills.  No falls or mechanisms of trauma.  No palpitations or diaphoresis.      Physical Exam   Triage Vital Signs: ED Triage Vitals  Enc Vitals Group     BP 02/21/23 1510 129/82     Pulse Rate 02/21/23 1510 74     Resp 02/21/23 1510 19     Temp 02/21/23 1510 98.4 F (36.9 C)     Temp Source 02/21/23 1510 Oral     SpO2 02/21/23 1510 96 %     Weight --      Height --      Head Circumference --      Peak Flow --      Pain Score 02/21/23 1512 5     Pain Loc --      Pain Edu? --      Excl. in GC? --     Most recent vital signs: Vitals:   02/21/23 1510  BP: 129/82  Pulse: 74  Resp: 19  Temp: 98.4 F (36.9 C)  SpO2: 96%     General: Alert and in no acute distress. Eyes:  PERRL. EOMI. Head: No acute traumatic findings ENT:      Nose: No congestion/rhinnorhea.      Mouth/Throat: Mucous membranes are moist. Neck: No stridor. No cervical spine tenderness to palpation. Cardiovascular:  Good peripheral perfusion. Patient has some right sided anterior chest discomfort to palpation. Respiratory: Normal respiratory effort  without tachypnea or retractions. Lungs CTAB. Good air entry to the bases with no decreased or absent breath sounds. Gastrointestinal: Bowel sounds 4 quadrants. Soft and nontender to palpation. No guarding or rigidity. No palpable masses. No distention. No CVA tenderness. Musculoskeletal: Full range of motion to all extremities.  Neurologic:  No gross focal neurologic deficits are appreciated.  Skin:   No rash noted   ED Results / Procedures / Treatments   Labs (all labs ordered are listed, but only abnormal results are displayed) Labs Reviewed  SARS CORONAVIRUS 2 BY RT PCR  BASIC METABOLIC PANEL  CBC  HEPATIC FUNCTION PANEL  LIPASE, BLOOD  TROPONIN I (HIGH SENSITIVITY)  TROPONIN I (HIGH SENSITIVITY)     EKG  Normal sinus rhythm.  No ST segment elevation or other apparent arrhythmia.   RADIOLOGY  I personally viewed and evaluated these images as part of my medical decision making, as well as reviewing the written report by the radiologist.  ED Provider Interpretation: No acute abnormality on chest x-ray.    PROCEDURES:  Critical Care performed: No  ED EKG  Date/Time: 02/21/2023 6:02 PM  Performed  by: Orvil Feil, PA-C Authorized by: Chesley Noon, MD      MEDICATIONS ORDERED IN ED: Medications  alum & mag hydroxide-simeth (MAALOX/MYLANTA) 200-200-20 MG/5ML suspension 30 mL (30 mLs Oral Given 02/21/23 1612)  famotidine (PEPCID) 40 MG/5ML suspension 20 mg (20 mg Oral Given 02/21/23 1653)     IMPRESSION / MDM / ASSESSMENT AND PLAN / ED COURSE  I reviewed the triage vital signs and the nursing notes.                              Assessment and plan: Chest pain: 27 year old male presents to the ED with right sided chest pain that started this morning after a nap without associated shortness of breath, diaphoresis, or palpitations.  On exam, patient is alert, active and nontoxic-appearing with no increased work of breathing and no pitting edema of the  bilateral lower extremities.  He does have some reproducible chest pain with palpation over the right anterior chest and he reports that chest pain seemed to start several minutes after he had a breakfast.  On Diet Coke.  CBC and BMP within range.  Delta troponin within range.  EKG indicates normal sinus rhythm without ST segment elevation or other apparent arrhythmia.  Patient felt improved after GI cocktail and Pepcid.  He feels comfortable being discharged.  Options were given to return with new or worsening symptoms.       FINAL CLINICAL IMPRESSION(S) / ED DIAGNOSES   Final diagnoses:  Chest pain, unspecified type     Rx / DC Orders   ED Discharge Orders     None        Note:  This document was prepared using Dragon voice recognition software and may include unintentional dictation errors.   Pia Mau [place], PA-C 02/21/23 1804    Chesley Noon, MD 02/21/23 [phone]

## 2023-02-21 NOTE — ED Notes (Signed)
See triage notes. Patient c/o chest pain. Came in earlier and left due to improvement. Stated he took a nap and was woken up by chest pain. Has a history of GERD.

## 2023-02-21 NOTE — ED Triage Notes (Signed)
Pt presents to ED with c/o of CP that started this morning. Pt endorses cough. Pt states pain is worse with cough. CP is reproducible.

## 2023-03-02 ENCOUNTER — Other Ambulatory Visit: Payer: Self-pay

## 2023-03-02 ENCOUNTER — Encounter: Payer: Self-pay | Admitting: Emergency Medicine

## 2023-03-02 ENCOUNTER — Emergency Department: Payer: 59

## 2023-03-02 ENCOUNTER — Emergency Department
Admission: EM | Admit: 2023-03-02 | Discharge: 2023-03-02 | Disposition: A | Discharge status: Home / Self Care | Payer: 59 | Attending: Student in an Organized Health Care Education/Training Program | Admitting: Student in an Organized Health Care Education/Training Program

## 2023-03-02 DIAGNOSIS — R519 Headache, unspecified: Secondary | ICD-10-CM | POA: Diagnosis not present

## 2023-03-02 DIAGNOSIS — R7309 Other abnormal glucose: Secondary | ICD-10-CM | POA: Insufficient documentation

## 2023-03-02 DIAGNOSIS — R42 Dizziness and giddiness: Secondary | ICD-10-CM | POA: Diagnosis not present

## 2023-03-02 DIAGNOSIS — G44209 Tension-type headache, unspecified, not intractable: Secondary | ICD-10-CM | POA: Diagnosis not present

## 2023-03-02 DIAGNOSIS — Z20822 Contact with and (suspected) exposure to covid-19: Secondary | ICD-10-CM | POA: Insufficient documentation

## 2023-03-02 LAB — BASIC METABOLIC PANEL
Anion gap: 9 (ref 5–15)
BUN: 13 mg/dL (ref 6–20)
CO2: 23 mmol/L (ref 22–32)
Calcium: 8.8 mg/dL — ABNORMAL LOW (ref 8.9–10.3)
Chloride: 103 mmol/L (ref 98–111)
Creatinine, Ser: 0.86 mg/dL (ref 0.61–1.24)
GFR, Estimated: >60 mL/min (ref >60)
Glucose, Bld: 107 mg/dL — ABNORMAL HIGH (ref 70–99)
Potassium: 3.8 mmol/L (ref 3.5–5.1)
Sodium: 135 mmol/L (ref 135–145)

## 2023-03-02 LAB — CBC
HCT: 43.7 % (ref 39.0–52.0)
Hemoglobin: 14.8 g/dL (ref 13.0–17.0)
MCH: 29.5 pg (ref 26.0–34.0)
MCHC: 33.9 g/dL (ref 30.0–36.0)
MCV: 87.1 fL (ref 80.0–100.0)
Platelets: 252 10*3/uL (ref 150–400)
RBC: 5.02 MIL/uL (ref 4.22–5.81)
RDW: 12.6 % (ref 11.5–15.5)
WBC: 9.1 10*3/uL (ref 4.0–10.5)
nRBC: 0 % (ref 0.0–0.2)

## 2023-03-02 LAB — URINALYSIS, W/ REFLEX TO CULTURE (INFECTION SUSPECTED)
Bacteria, UA: NONE SEEN
Bilirubin Urine: NEGATIVE
Glucose, UA: NEGATIVE mg/dL
Hgb urine dipstick: NEGATIVE
Ketones, ur: NEGATIVE mg/dL
Leukocytes,Ua: NEGATIVE
Nitrite: NEGATIVE
Protein, ur: NEGATIVE mg/dL
Specific Gravity, Urine: 1.002 — ABNORMAL LOW (ref 1.005–1.030)
Squamous Epithelial / HPF: NONE SEEN /HPF (ref 0–5)
pH: 7 (ref 5.0–8.0)

## 2023-03-02 LAB — CK: Total CK: 227 U/L (ref 49–397)

## 2023-03-02 LAB — CBG MONITORING, ED: Glucose-Capillary: 116 mg/dL — ABNORMAL HIGH (ref 70–99)

## 2023-03-02 LAB — SARS CORONAVIRUS 2 BY RT PCR: SARS Coronavirus 2 by RT PCR: NEGATIVE

## 2023-03-02 MED ORDER — SODIUM CHLORIDE 0.9 % IV BOLUS
1000.0000 mL | Freq: Once | INTRAVENOUS | Status: AC
  Administered 2023-03-02: 1000 mL via INTRAVENOUS

## 2023-03-02 MED ORDER — ONDANSETRON HCL 4 MG/2ML IJ SOLN
4.0000 mg | Freq: Once | INTRAMUSCULAR | Status: AC
  Administered 2023-03-02: 4 mg via INTRAVENOUS
  Filled 2023-03-02: qty 2

## 2023-03-02 MED ORDER — KETOROLAC TROMETHAMINE 30 MG/ML IJ SOLN
15.0000 mg | Freq: Once | INTRAMUSCULAR | Status: AC
  Administered 2023-03-02: 15 mg via INTRAVENOUS
  Filled 2023-03-02: qty 1

## 2023-03-02 NOTE — Discharge Instructions (Signed)
Follow-up with your regular doctor if not improving to 3 days.  Return if worsening.  Take Tylenol and ibuprofen for pain as needed.

## 2023-03-02 NOTE — ED Notes (Signed)
See triage note  Presents with h/a  States she developed h/a and some blurred vision to left eye while at work this am  Then became dizzy and fell  States h/a is in back of head States his vision is better States he thinks he had a panic attack

## 2023-03-02 NOTE — ED Triage Notes (Signed)
Pt to ED from work for headache, blurred vision, dizziness and fall today. States did not hit head or LOC, reports thinks had panic attack that made him fall.

## 2023-03-02 NOTE — ED Provider Notes (Signed)
[facility] Provider Note    Event Date/Time   First MD Initiated Contact with Patient 03/02/23 [phone]     (approximate)   History   Headache   HPI  [place] T Juniel is a 27 y.o. male with history of hyperlipidemia, prediabetes, anxiety presents emergency department with a headache.  States he lost vision in the left eye which everything got blurry.  Did have a fall 2 weeks ago which she states he did not hit his head as he fell forwards.  Today he fell down to the ground with dizziness when he had the headache.  No vomiting or diarrhea.  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  No vomiting.  Does feel constipated.  No numbness or tingling.  No slurred speech.  No recent illnesses but states he is little more congested than normal.  Unsure if he has been around anybody with COVID      Physical Exam   Triage Vital Signs: ED Triage Vitals  Enc Vitals Group     BP 03/02/23 0818 139/84     Pulse Rate 03/02/23 0818 92     Resp 03/02/23 0818 20     Temp 03/02/23 0818 98.6 F (37 C)     Temp src --      SpO2 03/02/23 0818 97 %     Weight 03/02/23 0820 270 lb (122.5 kg)     Height 03/02/23 0820 6' (1.829 m)     Head Circumference --      Peak Flow --      Pain Score 03/02/23 0820 7     Pain Loc --      Pain Edu? --      Excl. in GC? --     Most recent vital signs: Vitals:   03/02/23 0818  BP: 139/84  Pulse: 92  Resp: 20  Temp: 98.6 F (37 C)  SpO2: 97%     General: Awake, no distress.   CV:  Good peripheral perfusion. regular rate and  rhythm Resp:  Normal effort. Lungs cta Abd:  No distention.   Other:  Cranial nerves II through XII grossly intact, grips equal bilaterally, patient is able to ambulate without difficulty, does sound like he has some sinus congestion   ED Results / Procedures / Treatments   Labs (all labs  ordered are listed, but only abnormal results are displayed) Labs Reviewed  BASIC METABOLIC PANEL - Abnormal; Notable for the following components:      Result Value   Glucose, Bld 107 (*)    Calcium 8.8 (*)    All other components within normal limits  URINALYSIS, W/ REFLEX TO CULTURE (INFECTION SUSPECTED) - Abnormal; Notable for the following components:   Color, Urine COLORLESS (*)    APPearance CLEAR (*)    Specific Gravity, Urine 1.002 (*)    All other components within normal limits  CBG MONITORING, ED - Abnormal; Notable for the following components:   Glucose-Capillary 116 (*)    All other components within normal limits  SARS CORONAVIRUS 2 BY RT PCR  CBC  CK     EKG     RADIOLOGY CT of the head    PROCEDURES:   Procedures   MEDICATIONS ORDERED IN ED: Medications  sodium chloride 0.9 % bolus 1,000 mL (1,000 mLs  Intravenous New Bag/Given 03/02/23 0900)  ondansetron (ZOFRAN) injection 4 mg (4 mg Intravenous Given 03/02/23 0859)  ketorolac (TORADOL) 30 MG/ML injection 15 mg (15 mg Intravenous Given 03/02/23 0919)     IMPRESSION / MDM / ASSESSMENT AND PLAN / ED COURSE  I reviewed the triage vital signs and the nursing notes.                              Differential diagnosis includes, but is not limited to, CVA, subdural, tension headache, cluster headache, migraine, covid, dehydration, heat exhaustion/stroke  Patient's presentation is most consistent with acute presentation with potential threat to life or bodily function.   Labs and imaging ordered due to the patient's severe headache with visual changes  Patient already has an IV.  Will give him normal saline 1 L IV along with Zofran.  Once CT is clear we will add Toradol, also evaluate for heat exposure/stroke.  Did add a urinalysis COVID test due to recent COVID increase to the area which is presenting with a headache   CT of the head independently reviewed interpreted by me as being negative for any  acute abnormality.  Therefore I did add Toradol 15 mg IV for pain  Patient's labs are reassuring  I did explain the findings to the patient.  Since his labs are normal adult he has rhabdomyolysis, heatstroke, or covid.  Feel that he is most likely just having a migraine headache.  Vision has returned to normal.  He is feeling better after the Toradol and fluids.  Feel that he can follow-up with his regular doctor concerning his headache.  Return emergency department worsening.  Gave patient reassurance.  Was discharged stable condition.  FINAL CLINICAL IMPRESSION(S) / ED DIAGNOSES   Final diagnoses:  Tension headache     Rx / DC Orders   ED Discharge Orders     None        Note:  This document was prepared using Dragon voice recognition software and may include unintentional dictation errors.    Faythe Ghee, PA-C 03/02/23 1050    Willy Eddy, MD 03/02/23 [phone]

## 2023-03-09 ENCOUNTER — Ambulatory Visit: Payer: Self-pay

## 2023-03-09 ENCOUNTER — Encounter: Payer: Self-pay | Admitting: Internal Medicine

## 2023-03-09 ENCOUNTER — Telehealth (INDEPENDENT_AMBULATORY_CARE_PROVIDER_SITE_OTHER): Payer: 59 | Admitting: Internal Medicine

## 2023-03-09 DIAGNOSIS — J069 Acute upper respiratory infection, unspecified: Secondary | ICD-10-CM | POA: Diagnosis not present

## 2023-03-09 MED ORDER — AMOXICILLIN-POT CLAVULANATE 400-57 MG/5ML PO SUSR: 875.0000 mg | Freq: Two times a day (BID) | ORAL | 0 refills | Status: AC

## 2023-03-09 NOTE — Telephone Encounter (Signed)
  Chief Complaint: COVID + Symptoms: Same s/s as from encounter earlier today Frequency:  Pertinent Negatives: Patient denies  Disposition: [] ED /[] Urgent Care (no appt availability in office) / [] Appointment(In office/virtual)/ []  [facility]/ [] Home Care/ [] Refused Recommended Disposition /[] [facility]/ [x]  Follow-up with PCP Additional Notes: Pt had a VV with provider earlier today. Pt has tested positive for COVID. Pt is interested in antiviral medication, but is unsure if he can afford it. Pt 's cough is fairly constant. Pt is requesting cough medication to be called in as well. If pt cannot afford rx medications, pt will treat with home care.   Encourage rest and fluids Advised him to get a COVID test OTC and take this If positive, will need to treat symptoms only.  Reason for Disposition  [1] COVID-19 infection suspected by caller or triager AND [2] mild symptoms (cough, fever, or others) AND [3] negative COVID-19 rapid test  Answer Assessment - Initial Assessment Questions 1. COVID-19 DIAGNOSIS: "How do you know that you have COVID?" (e.g., positive lab test or self-test, diagnosed by doctor or NP/PA, symptoms after exposure).     Test 2. COVID-19 EXPOSURE: "Was there any known exposure to COVID before the symptoms began?" CDC Definition of close contact: within 6 feet (2 meters) for a total of 15 minutes or more over a 24-hour period.       3. ONSET: "When did the COVID-19 symptoms start?"       4. WORST SYMPTOM: "What is your worst symptom?" (e.g., cough, fever, shortness of breath, muscle aches)     Cough 5. COUGH: "Do you have a cough?" If Yes, ask: "How bad is the cough?"       yes  Protocols used: Coronavirus (COVID-19) Diagnosed or Suspected-A-AH

## 2023-03-09 NOTE — Patient Instructions (Signed)
Sore Throat When you have a sore throat, your throat may feel: Tender. Burning. Irritated. Scratchy. Painful when you swallow. Painful when you talk. Many things can cause a sore throat, such as: An infection. Allergies. Dry air. Smoke or pollution. Radiation treatment for cancer. Gastroesophageal reflux disease (GERD). A tumor. A sore throat can be the first sign of another sickness. It can happen with other problems, like: Coughing. Sneezing. Fever. Swelling of the glands in the neck. Most sore throats go away without treatment. Follow these instructions at home:     Medicines Take over-the-counter and prescription medicines only as told by your doctor. Children often get sore throats. Do not give your child aspirin. Use throat sprays to soothe your throat as told by your health care provider. Managing pain To help with pain: Sip warm liquids, such as broth, herbal tea, or warm water. Eat or drink cold or frozen liquids, such as frozen ice pops. Rinse your mouth (gargle) with a salt water mixture 3-4 times a day or as needed. To make salt water, dissolve -1 tsp (3-6 g) of salt in 1 cup (237 mL) of warm water. Do not swallow this mixture. Suck on hard candy or throat lozenges. Put a cool-mist humidifier in your bedroom at night. Sit in the bathroom with the door closed for 5-10 minutes while you run hot water in the shower. General instructions Do not smoke or use any products that contain nicotine or tobacco. If you need help quitting, ask your doctor. Get plenty of rest. Drink enough fluid to keep your pee (urine) pale yellow. Wash your hands often for at least 20 seconds with soap and water. If soap and water are not available, use hand sanitizer. Contact a doctor if: You have a fever for more than 2-3 days. You keep having symptoms for more than 2-3 days. Your throat does not get better in 7 days. You have a fever and your symptoms suddenly get worse. Your  child who is 3 months to 3 years old has a temperature of 102.2F (39C) or higher. Get help right away if: You have trouble breathing. You cannot swallow fluids, soft foods, or your spit. You have swelling in your throat or neck that gets worse. You feel like you may vomit (nauseous) and this feeling lasts a long time. You cannot stop vomiting. These symptoms may be an emergency. Get help right away. Call your local emergency services (911 in the [place] Do not wait to see if the symptoms will go away. Do not drive yourself to the hospital. Summary A sore throat is a painful, burning, irritated, or scratchy throat. Many things can cause a sore throat. Take over-the-counter medicines only as told by your doctor. Get plenty of rest. Drink enough fluid to keep your pee (urine) pale yellow. Contact a doctor if your symptoms get worse or your sore throat does not get better within 7 days. This information is not intended to replace advice given to you by your health care provider. Make sure you discuss any questions you have with your health care provider. Document Revised: 11/11/2020 Document Reviewed: 11/11/2020 Elsevier Patient Education  2024 Elsevier Inc.  

## 2023-03-09 NOTE — Progress Notes (Signed)
Virtual Visit via Video Note  I connected with Timothy [name] on 03/09/23 at  4:00 PM EDT by a video enabled telemedicine application and verified that I am speaking with the correct person using two identifiers.  Location: Patient: Home Provider: Office  Persons participating in this video call: [name], NP and Timothy Verstraete.   I discussed the limitations of evaluation and management by telemedicine and the availability of in person appointments. The patient expressed understanding and agreed to proceed.  History of Present Illness:  Patient reports headache, sinus pain, and pressure nasal congestion, sore throat and cough.  He reports this started yesterday.  He describes the headache as pressure.  He is not able to blow anything out of his nose.  He is having some difficulty swallowing.  The cough is mostly nonproductive.  He denies runny nose, ear pain, shortness of breath.  He did have some nausea and vomiting but this has resolved.  He has been running fevers but denies chills or body aches.  He has tried Tylenol OTC with minimal relief of symptoms.  He was seen in the ER 7/4 with complaint of headache and blurry vision.  CT of the head did not show any evidence of acute findings.  He was treated with IV fluids, Toradol and Zofran.  He was diagnosed with a migraine.  He has not had sick contacts that he is aware of.   Past Medical History:  Diagnosis Date   Allergy    Anxiety    Depression    Diverticulitis    Dyspnea    Fainting spell    in middle school   Family history of bone cancer    Family history of breast cancer    Hyperlipidemia     Current Outpatient Medications  Medication Sig Dispense Refill   cetirizine HCl (ZYRTEC) 5 MG/5ML SOLN Take 10 mLs (10 mg total) by mouth daily for 14 days. 140 mL 0   fluticasone (FLONASE) 50 MCG/ACT nasal spray Place 2 sprays into both nostrils daily. 16 g 6   HYDROcodone bit-homatropine (HYCODAN) 5-1.5 MG/5ML syrup Take 5 mLs by  mouth every 6 (six) hours as needed for cough. (Patient not taking: Reported on 12/27/2022) 80 mL 0   ondansetron (ZOFRAN-ODT) 4 MG disintegrating tablet Take 1 tablet (4 mg total) by mouth every 8 (eight) hours as needed for vomiting or nausea. (Patient not taking: Reported on 12/27/2022) 20 tablet 0   No current facility-administered medications for this visit.    No Known Allergies  Family History  Problem Relation Age of Onset   Alcohol abuse Mother    Depression Mother    Cervical cancer Mother    Depression Father    Anxiety disorder Father    Healthy Sister    Alport syndrome Maternal Aunt    Heart disease Maternal Grandmother    Diabetes Maternal Grandmother    Cancer Maternal Grandmother        breast cancer   Alport syndrome Maternal Grandmother    Cancer Paternal Grandfather        Bone cancer    Social History   Socioeconomic History   Marital status: Single    Spouse name: Not on file   Number of children: Not on file   Years of education: Not on file   Highest education level: High school graduate  Occupational History   Not on file  Tobacco Use   Smoking status: Former    Current packs/day: 0.25  Types: Cigarettes   Smokeless tobacco: Former    Types: Snuff   Tobacco comments:    pt smoke pack a week  Vaping Use   Vaping status: Every Day   Last attempt to quit: 09/09/2019   Substances: Nicotine, Flavoring  Substance and Sexual Activity   Alcohol use: Not Currently    Alcohol/week: 4.0 standard drinks of alcohol    Types: 4 Cans of beer per week    Comment: occassionally   Drug use: No   Sexual activity: Not Currently  Other Topics Concern   Not on file  Social History Narrative   Not on file   Social Determinants of Health   Financial Resource Strain: Not on file  Food Insecurity: Not on file  Transportation Needs: Not on file  Physical Activity: Not on file  Stress: Not on file  Social Connections: Not on file  Intimate Partner  Violence: Not on file     Constitutional: Patient reports headache and fever.  Denies malaise, fatigue, or abrupt weight changes.  HEENT: Patient reports sinus pain and pressure, nasal congestion and sore throat.  Denies eye pain, eye redness, ear pain, ringing in the ears, wax buildup, runny nose, bloody nose. Respiratory: Patient reports cough.  Denies difficulty breathing, shortness of breath, or sputum production.   Cardiovascular: Denies chest pain, chest tightness, palpitations or swelling in the hands or feet.  Gastrointestinal: Patient reports nausea vomiting.  Denies abdominal pain, bloating, constipation, diarrhea or blood in the stool.    No other specific complaints in a complete review of systems (except as listed in HPI above).    Observations/Objective:  Wt Readings from Last 3 Encounters:  03/02/23 270 lb (122.5 kg)  02/21/23 268 lb 15.4 oz (122 kg)  12/27/22 269 lb (122 kg)    General: Appears his stated age, obese, in NAD. HEENT: Head: normal shape and size, no sinus tenderness noted; Nose: Congestion noted; Throat/Mouth: Hoarseness noted Pulmonary/Chest: Normal effort. No respiratory distress.  Neurological: Alert and oriented.  Coordination normal.   BMET    Component Value Date/Time   NA 135 03/02/2023 0822   NA 138 12/16/2014 0839   K 3.8 03/02/2023 0822   K 3.9 12/16/2014 0839   CL 103 03/02/2023 0822   CL 106 12/16/2014 0839   CO2 23 03/02/2023 0822   CO2 26 12/16/2014 0839   GLUCOSE 107 (H) 03/02/2023 0822   GLUCOSE 110 (H) 12/16/2014 0839   BUN 13 03/02/2023 0822   BUN 11 12/16/2014 0839   CREATININE 0.86 03/02/2023 0822   CREATININE 0.97 07/29/2022 0905   CALCIUM 8.8 (L) 03/02/2023 0822   CALCIUM 8.8 (L) 12/16/2014 0839   GFRNONAA >60 03/02/2023 0822   GFRNONAA >60 12/16/2014 0839   GFRAA >60 12/09/2019 0555   GFRAA >60 12/16/2014 0839    Lipid Panel     Component Value Date/Time   CHOL 160 07/29/2022 0905   TRIG 166 (H) 07/29/2022  0905   HDL 35 (L) 07/29/2022 0905   CHOLHDL 4.6 07/29/2022 0905   LDLCALC 99 07/29/2022 0905    CBC    Component Value Date/Time   WBC 9.1 03/02/2023 0822   RBC 5.02 03/02/2023 0822   HGB 14.8 03/02/2023 0822   HGB 14.6 12/16/2014 0839   HCT 43.7 03/02/2023 0822   HCT 44.0 12/16/2014 0839   PLT 252 03/02/2023 0822   PLT 233 12/16/2014 0839   MCV 87.1 03/02/2023 0822   MCV 88 12/16/2014 0839   MCH 29.5  03/02/2023 0822   MCHC 33.9 03/02/2023 0822   RDW 12.6 03/02/2023 0822   RDW 13.8 12/16/2014 0839   LYMPHSABS 3.0 03/13/2022 1854   LYMPHSABS 2.9 12/16/2014 0839   MONOABS 1.0 03/13/2022 1854   MONOABS 1.2 (H) 12/16/2014 0839   EOSABS 0.1 03/13/2022 1854   EOSABS 0.3 12/16/2014 0839   BASOSABS 0.0 03/13/2022 1854   BASOSABS 0.1 12/16/2014 0839    Hgb A1C Lab Results  Component Value Date   HGBA1C 5.6 07/29/2022       Assessment and Plan: Viral URI with cough:  Encourage rest and fluids Advised him to get a COVID test OTC and take this If positive, will need to treat symptoms only If negative, will treat with Augmentin 875-125 mg given his history of strep throat Okay to continue ibuprofen or Tylenol OTC as needed  Schedule an appointment for your annual exam  Follow Up Instructions:    I discussed the assessment and treatment plan with the patient. The patient was provided an opportunity to ask questions and all were answered. The patient agreed with the plan and demonstrated an understanding of the instructions.   The patient was advised to call back or seek an in-person evaluation if the symptoms worsen or if the condition fails to improve as anticipated.     [name], NP

## 2023-03-10 ENCOUNTER — Other Ambulatory Visit: Payer: Self-pay

## 2023-03-10 ENCOUNTER — Emergency Department
Admission: EM | Admit: 2023-03-10 | Discharge: 2023-03-10 | Disposition: A | Discharge status: Home / Self Care | Payer: 59 | Attending: Emergency Medicine | Admitting: Emergency Medicine

## 2023-03-10 ENCOUNTER — Encounter: Payer: Self-pay | Admitting: Internal Medicine

## 2023-03-10 ENCOUNTER — Encounter: Payer: Self-pay | Admitting: Emergency Medicine

## 2023-03-10 DIAGNOSIS — U071 COVID-19: Secondary | ICD-10-CM | POA: Diagnosis not present

## 2023-03-10 DIAGNOSIS — R059 Cough, unspecified: Secondary | ICD-10-CM | POA: Diagnosis not present

## 2023-03-10 LAB — SARS CORONAVIRUS 2 BY RT PCR: SARS Coronavirus 2 by RT PCR: POSITIVE — AB

## 2023-03-10 NOTE — Telephone Encounter (Signed)
I see he went to the ER for treatment for this.

## 2023-03-10 NOTE — ED Triage Notes (Signed)
Pt here with a cough and rib pain x3 days. Pt states he believes that he has covid and took an expired covid test that was positive. Pt also having vomiting and diarrhea. Pt ambulatory to triage.

## 2023-03-10 NOTE — Discharge Instructions (Signed)
Your COVID test is positive.  You may continue to take Tylenol/ibuprofen per package instructions to help with your symptoms.  Please remember that this is highly contagious to others and you should remain in isolation for 5 days unless you require medical attention, and then when you return to work usually wear a mask for an additional 5 days.  Please return for any new, worsening, or change in symptoms or other concerns.  It was a pleasure caring for you today.

## 2023-03-10 NOTE — ED Notes (Signed)
See triage note  Presents with cough  States he is also having some pain to rib  Afebrile on arrival  states cough is non productive  Did have a visit with his PCP yesterday  He also took a COVID test which was positive but he thinks it may have been wrong because it was expired

## 2023-03-10 NOTE — ED Provider Notes (Signed)
[facility] Provider Note    Event Date/Time   First MD Initiated Contact with Patient 03/10/23 0720     (approximate)   History   Cough   HPI  Timothy [name] is a 27 y.o. male with a past medical history of anxiety, depression, obesity who presents today for evaluation of cough, sore throat, for the past 2 days.  He reports that he had a virtual visit yesterday and they advised him to take a home COVID test, which patient reports was positive.  He reports that it was an expired test so he is unsure if it is accurate.  He denies fevers or chills.  He denies chest pain or shortness of breath.  He has not had any nausea, vomiting, diarrhea.  No abdominal pain.  Patient Active Problem List   Diagnosis Date Noted   Hypertriglyceridemia 06/23/2022   Insomnia 06/23/2022   Gastroesophageal reflux disease without esophagitis 01/25/2022   Class 2 obesity due to excess calories with body mass index (BMI) of 35.0 to 35.9 in adult 03/29/2021   Anxiety and depression 07/28/2017          Physical Exam   Triage Vital Signs: ED Triage Vitals  Encounter Vitals Group     BP 03/10/23 0715 128/86     Systolic BP Percentile --      Diastolic BP Percentile --      Pulse Rate 03/10/23 0715 (!) 103     Resp 03/10/23 0715 18     Temp 03/10/23 0715 98.4 F (36.9 C)     Temp Source 03/10/23 0715 Oral     SpO2 03/10/23 0715 100 %     Weight 03/10/23 0716 270 lb 1 oz (122.5 kg)     Height 03/10/23 0716 6' (1.829 m)     Head Circumference --      Peak Flow --      Pain Score 03/10/23 0716 6     Pain Loc --      Pain Education --      Exclude from Growth Chart --     Most recent vital signs: Vitals:   03/10/23 0715  BP: 128/86  Pulse: (!) 103  Resp: 18  Temp: 98.4 F (36.9 C)  SpO2: 100%    Physical Exam Vitals and nursing note reviewed.  Constitutional:      General: Awake and alert. No acute distress.    Appearance: Normal appearance. The patient is  normal weight.  HENT:     Head: Normocephalic and atraumatic.     Mouth: Mucous membranes are moist.  Nasal congestion present Eyes:     General: PERRL. Normal EOMs        Right eye: No discharge.        Left eye: No discharge.     Conjunctiva/sclera: Conjunctivae normal.  Cardiovascular:     Rate and Rhythm: Normal rate and regular rhythm.     Pulses: Normal pulses.  Pulmonary:     Effort: Pulmonary effort is normal. No respiratory distress.  Able to speak easily in complete sentences    Breath sounds: Normal breath sounds.  Abdominal:     Abdomen is soft. There is no abdominal tenderness. No rebound or guarding. No distention. Musculoskeletal:        General: No swelling. Normal range of motion.     Cervical back: Normal range of motion and neck supple.  Skin:    General: Skin is warm and dry.  Capillary Refill: Capillary refill takes less than 2 seconds.     Findings: No rash.  Neurological:     Mental Status: The patient is awake and alert.      ED Results / Procedures / Treatments   Labs (all labs ordered are listed, but only abnormal results are displayed) Labs Reviewed  SARS CORONAVIRUS 2 BY RT PCR - Abnormal; Notable for the following components:      Result Value   SARS Coronavirus 2 by RT PCR POSITIVE (*)    All other components within normal limits     EKG     RADIOLOGY     PROCEDURES:  Critical Care performed:   Procedures   MEDICATIONS ORDERED IN ED: Medications - No data to display   IMPRESSION / MDM / ASSESSMENT AND PLAN / ED COURSE  I reviewed the triage vital signs and the nursing notes.   Differential diagnosis includes, but is not limited to, viral URI, COVID-19, bronchitis.  Patient is awake and alert, nontoxic in appearance.  He is afebrile and has a normal oxygen saturation of 100% on room air.  COVID swab obtained in triage is positive for COVID-19.  Patient was made aware of this.  He demonstrates no increased work of  breathing, no accessory muscle use, and is overall well in appearance.  His lungs are clear to auscultation bilaterally, he is afebrile, do not suspect pneumonia.  No pleurisy.  No clinical signs or symptoms of DVT.  No hemoptysis or exogenous hormone use.  Do not suspect PE.  He is tolerating p.o.  He reports that he has not received any of the COVID vaccinations.  He declines Paxlovid.  We discussed Tylenol/ibuprofen per package instructions for symptomatic management.  We also discussed isolation instructions unless he requires medical attention.  Patient understands and agrees with plan.  He was discharged in stable condition.   Patient's presentation is most consistent with acute complicated illness / injury requiring diagnostic workup.       FINAL CLINICAL IMPRESSION(S) / ED DIAGNOSES   Final diagnoses:  COVID-19     Rx / DC Orders   ED Discharge Orders     None        Note:  This document was prepared using Dragon voice recognition software and may include unintentional dictation errors.   Keturah Shavers 03/10/23 4098    Chesley Noon, MD 03/11/23 1524

## 2023-03-13 NOTE — Telephone Encounter (Signed)
Note sent to your My Chart.    Thank you,   -Vernona Rieger

## 2023-04-02 IMAGING — CT CT HEAD W/O CM
4 series · 17 of 47 positions shown, 19 images · non-contrast
Comparison: CT head dated August 10, 2014.

CLINICAL DATA: Severe headache. Weakness and dizziness for the past
month.



[Series 2: head bone · axial · 0.43mm/px · z∈[-116,-60]mm · 4 of 81 slices shown]
[im 9/81  bone]
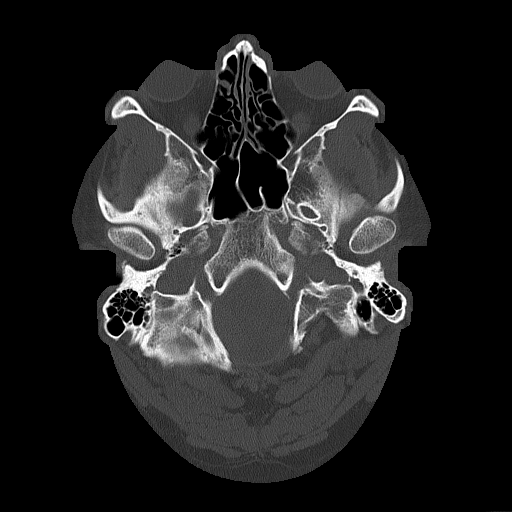
[im 17/81  bone]
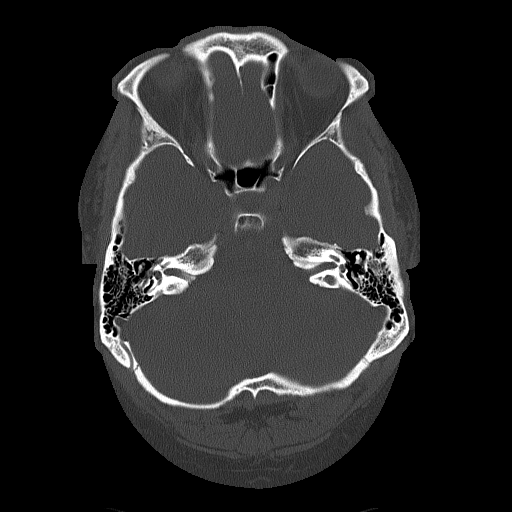
[im 25/81  bone]
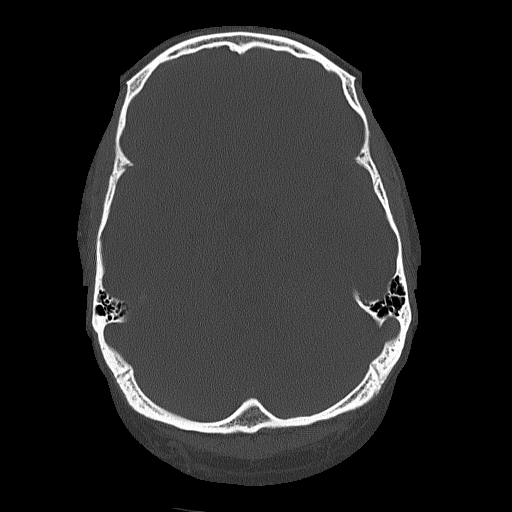
[im 37/81  bone]
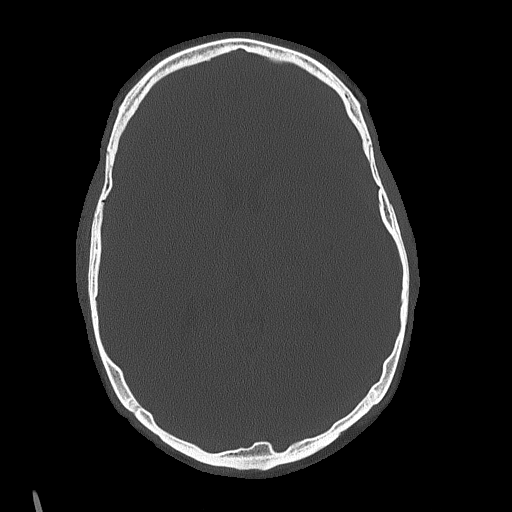

[Series 3: head wo · axial · 0.43mm/px · z∈[-112,+8]mm · 7 of 33 slices shown, 9 images]
[im 5/33  brain]
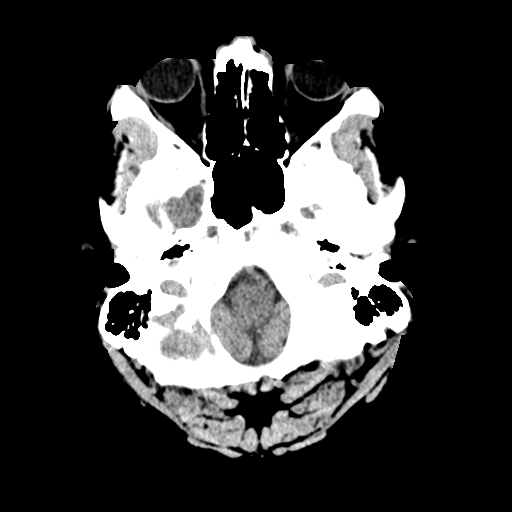
[im 5/33  bone]
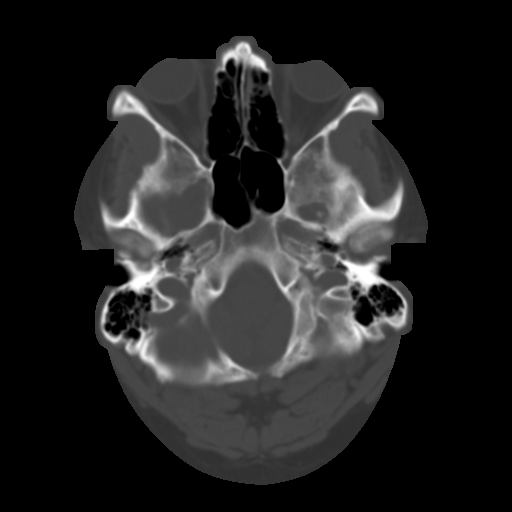
[im 9/33  brain]
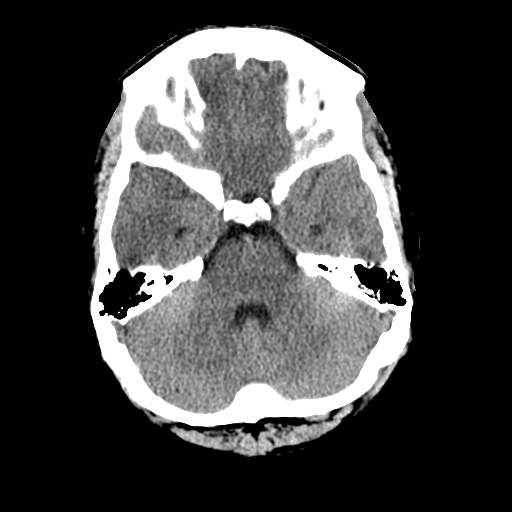
[im 13/33  brain]
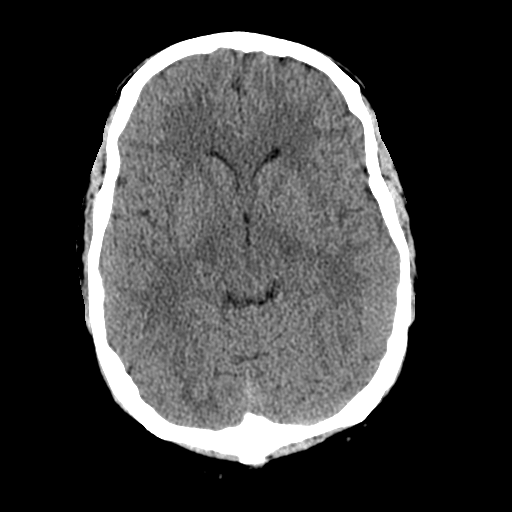
[im 17/33  brain]
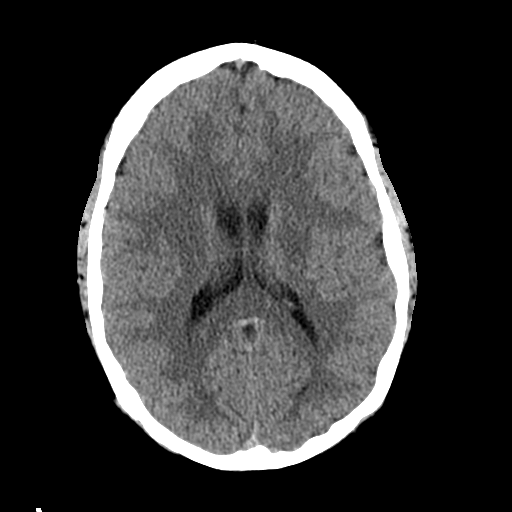
[im 21/33  brain]
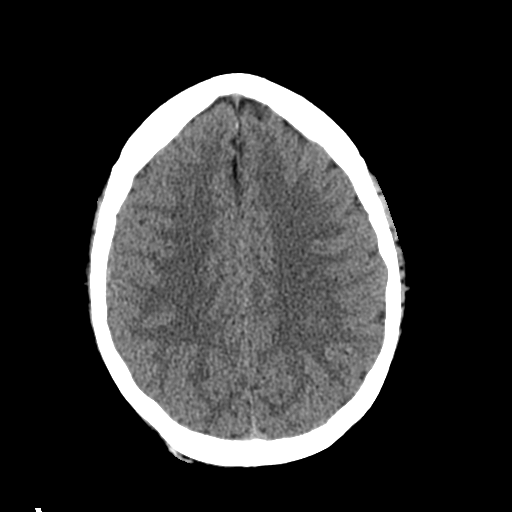
[im 21/33  bone]
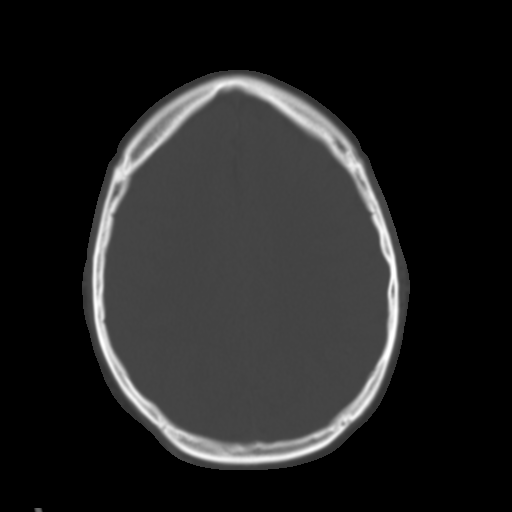
[im 25/33  brain]
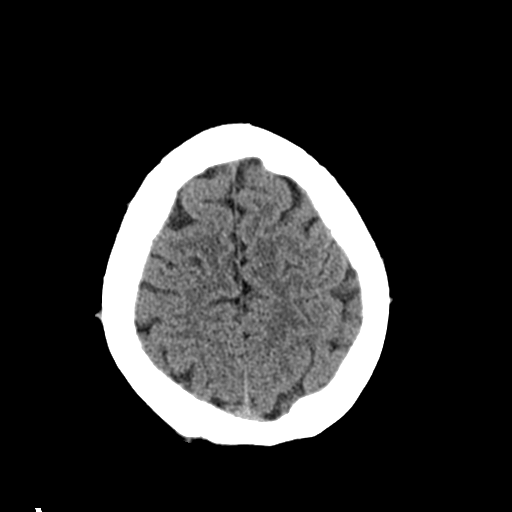
[im 29/33  brain]
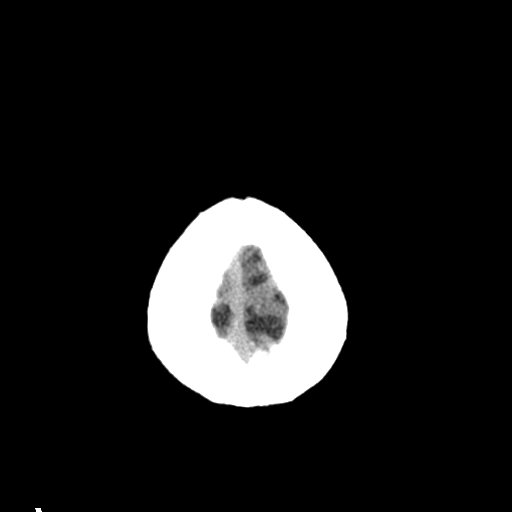

[Series 4: coronal soft tissue · coronal · 0.33mm/px · 3 of 70 slices shown]
[im 24/70  brain]
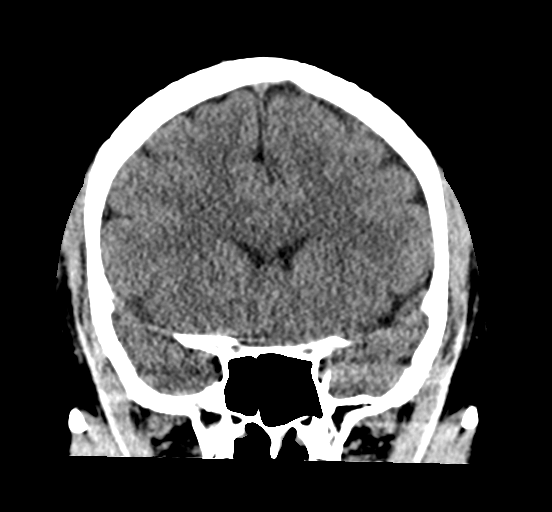
[im 31/70  brain]
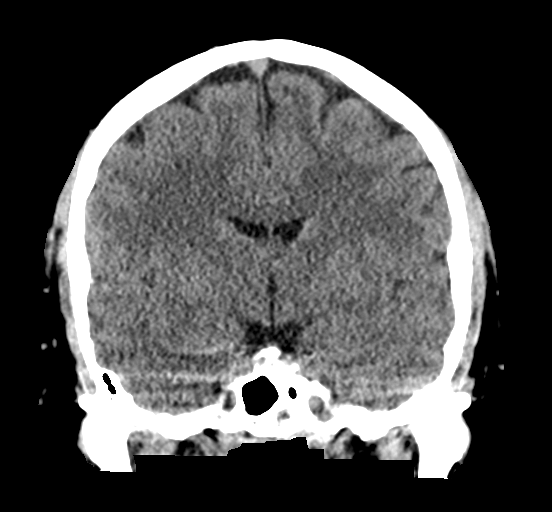
[im 39/70  brain]
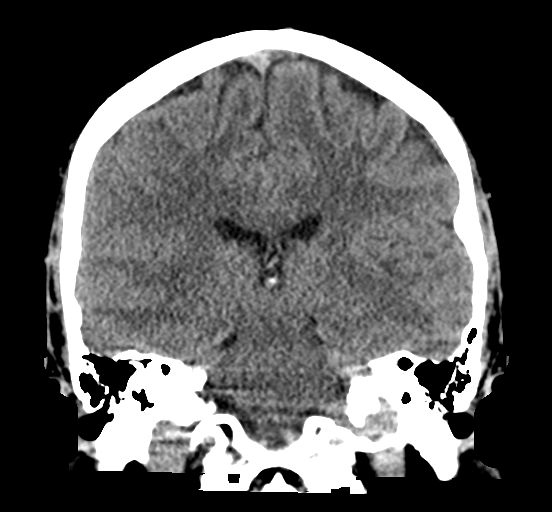

[Series 5: sagittal soft tissue · sagittal · 0.33mm/px · 3 of 56 slices shown]
[im 19/56  brain]
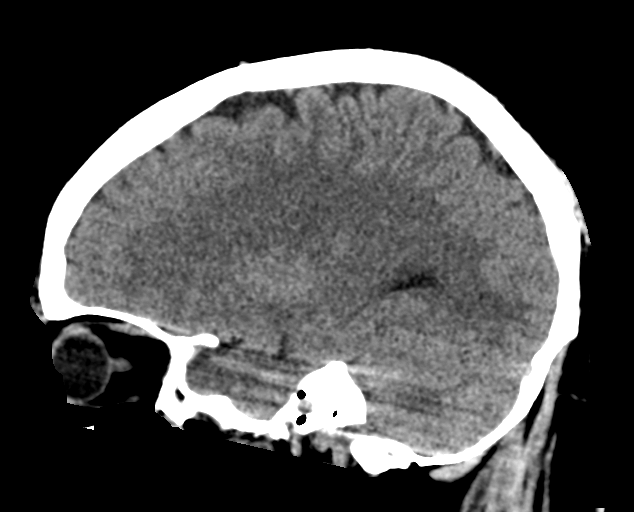
[im 28/56  brain]
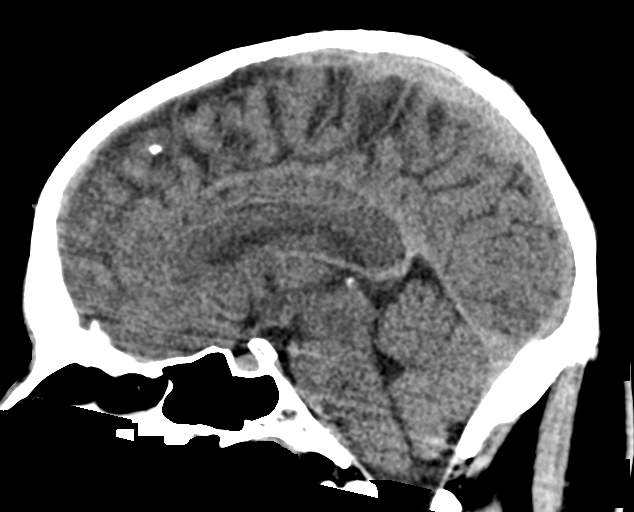
[im 37/56  brain]
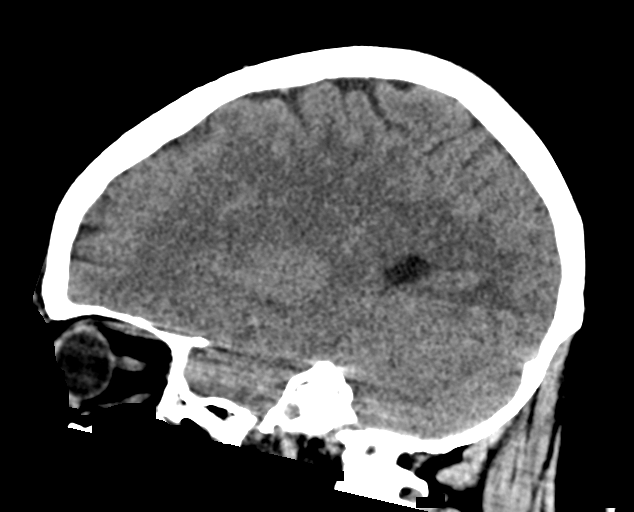

[17 of 47 positions shown; findings below may reference images not displayed]

FINDINGS: Brain: No evidence of acute infarction, hemorrhage, hydrocephalus,
extra-axial collection or mass lesion/mass effect.

Vascular: No hyperdense vessel or unexpected calcification.

Skull: Normal. Negative for fracture or focal lesion.

Sinuses/Orbits: No acute finding.

Other: None.
IMPRESSION: 1. No acute intracranial abnormality.

## 2023-05-12 ENCOUNTER — Emergency Department
Admission: EM | Admit: 2023-05-12 | Discharge: 2023-05-12 | Disposition: A | Discharge status: Home / Self Care | Payer: 59 | Attending: Emergency Medicine | Admitting: Emergency Medicine

## 2023-05-12 ENCOUNTER — Other Ambulatory Visit: Payer: Self-pay

## 2023-05-12 ENCOUNTER — Emergency Department: Payer: 59

## 2023-05-12 DIAGNOSIS — R1031 Right lower quadrant pain: Secondary | ICD-10-CM | POA: Diagnosis not present

## 2023-05-12 DIAGNOSIS — R9431 Abnormal electrocardiogram [ECG] [EKG]: Secondary | ICD-10-CM | POA: Diagnosis not present

## 2023-05-12 DIAGNOSIS — K529 Noninfective gastroenteritis and colitis, unspecified: Secondary | ICD-10-CM | POA: Diagnosis not present

## 2023-05-12 DIAGNOSIS — R188 Other ascites: Secondary | ICD-10-CM | POA: Diagnosis not present

## 2023-05-12 DIAGNOSIS — Z20822 Contact with and (suspected) exposure to covid-19: Secondary | ICD-10-CM | POA: Diagnosis not present

## 2023-05-12 LAB — CBC WITH DIFFERENTIAL/PLATELET
Abs Immature Granulocytes: 0.05 10*3/uL (ref 0.00–0.07)
Basophils Absolute: 0 10*3/uL (ref 0.0–0.1)
Basophils Relative: 0 %
Eosinophils Absolute: 0.1 10*3/uL (ref 0.0–0.5)
Eosinophils Relative: 1 %
HCT: 45.5 % (ref 39.0–52.0)
Hemoglobin: 15.1 g/dL (ref 13.0–17.0)
Immature Granulocytes: 0 %
Lymphocytes Relative: 29 %
Lymphs Abs: 3.3 10*3/uL (ref 0.7–4.0)
MCH: 28.9 pg (ref 26.0–34.0)
MCHC: 33.2 g/dL (ref 30.0–36.0)
MCV: 87.2 fL (ref 80.0–100.0)
Monocytes Absolute: 1.2 10*3/uL — ABNORMAL HIGH (ref 0.1–1.0)
Monocytes Relative: 11 %
Neutro Abs: 6.9 10*3/uL (ref 1.7–7.7)
Neutrophils Relative %: 59 %
Platelets: 230 10*3/uL (ref 150–400)
RBC: 5.22 MIL/uL (ref 4.22–5.81)
RDW: 12.6 % (ref 11.5–15.5)
WBC: 11.7 10*3/uL — ABNORMAL HIGH (ref 4.0–10.5)
nRBC: 0 % (ref 0.0–0.2)

## 2023-05-12 LAB — COMPREHENSIVE METABOLIC PANEL
ALT: 45 U/L — ABNORMAL HIGH (ref 0–44)
AST: 28 U/L (ref 15–41)
Albumin: 4.3 g/dL (ref 3.5–5.0)
Alkaline Phosphatase: 65 U/L (ref 38–126)
Anion gap: 9 (ref 5–15)
BUN: 16 mg/dL (ref 6–20)
CO2: 23 mmol/L (ref 22–32)
Calcium: 9 mg/dL (ref 8.9–10.3)
Chloride: 104 mmol/L (ref 98–111)
Creatinine, Ser: 0.8 mg/dL (ref 0.61–1.24)
GFR, Estimated: >60 mL/min (ref >60)
Glucose, Bld: 86 mg/dL (ref 70–99)
Potassium: 3.9 mmol/L (ref 3.5–5.1)
Sodium: 136 mmol/L (ref 135–145)
Total Bilirubin: 0.6 mg/dL (ref 0.3–1.2)
Total Protein: 7.8 g/dL (ref 6.5–8.1)

## 2023-05-12 LAB — URINALYSIS, ROUTINE W REFLEX MICROSCOPIC
Bilirubin Urine: NEGATIVE
Glucose, UA: NEGATIVE mg/dL
Hgb urine dipstick: NEGATIVE
Ketones, ur: NEGATIVE mg/dL
Leukocytes,Ua: NEGATIVE
Nitrite: NEGATIVE
Protein, ur: NEGATIVE mg/dL
Specific Gravity, Urine: 1.009 (ref 1.005–1.030)
pH: 5 (ref 5.0–8.0)

## 2023-05-12 LAB — LIPASE, BLOOD: Lipase: 30 U/L (ref 11–51)

## 2023-05-12 MED ORDER — IOHEXOL 300 MG/ML  SOLN
100.0000 mL | Freq: Once | INTRAMUSCULAR | Status: AC | PRN
  Administered 2023-05-12: 100 mL via INTRAVENOUS

## 2023-05-12 MED ORDER — MORPHINE SULFATE (PF) 2 MG/ML IV SOLN
2.0000 mg | Freq: Once | INTRAVENOUS | Status: AC
  Administered 2023-05-12: 2 mg via INTRAVENOUS
  Filled 2023-05-12: qty 1

## 2023-05-12 MED ORDER — ONDANSETRON 8 MG PO TBDP: 8.0000 mg | ORAL_TABLET | Freq: Three times a day (TID) | ORAL | 0 refills | Status: DC | PRN

## 2023-05-12 NOTE — Discharge Instructions (Addendum)
Your blood work and urinalysis was normal today.  Your CT scan showed some inflammation of your small intestine.  This will likely resolve on its own.  You can take the Zofran every 8 hours as needed for nausea and vomiting. You can take 650 mg of Tylenol and 600 mg of ibuprofen every 6 hours as needed for pain.

## 2023-05-12 NOTE — ED Triage Notes (Signed)
Pt comes with c/o belly pain that started this morning. Pt states pain is intense.

## 2023-05-12 NOTE — ED Provider Notes (Signed)
[facility] Provider Note    Event Date/Time   First MD Initiated Contact with Patient 05/12/23 1926     (approximate)   History   Abdominal Pain   HPI  Timothy [name] is a 27 y.o. male with PMH of anxiety, depression and diverticulitis status post colectomy presents for evaluation of abdominal pain.  He states that it began this morning and has been constant with bouts of increasing pain that come and go.  Has pain began in the right lower quadrant and is now radiating across his stomach.  He endorses nausea, vomiting and constipation but denies diarrhea and urinary symptoms.      Physical Exam   Triage Vital Signs: ED Triage Vitals  Encounter Vitals Group     BP 05/12/23 1603 139/87     Systolic BP Percentile --      Diastolic BP Percentile --      Pulse Rate 05/12/23 1603 82     Resp 05/12/23 1603 18     Temp 05/12/23 1603 98.5 F (36.9 C)     Temp Source 05/12/23 1603 Oral     SpO2 05/12/23 1603 97 %     Weight --      Height --      Head Circumference --      Peak Flow --      Pain Score 05/12/23 1559 10     Pain Loc --      Pain Education --      Exclude from Growth Chart --     Most recent vital signs: Vitals:   05/12/23 1603 05/12/23 2021  BP: 139/87 127/72  Pulse: 82 80  Resp: 18 16  Temp: 98.5 F (36.9 C) 98.2 F (36.8 C)  SpO2: 97% 98%    General: Awake, no distress.  Sitting comfortably in a chair. CV:  Good peripheral perfusion.  RRR. Resp:  Normal effort.  CTAB. Abd:  No distention.  Tender to palpation across lower abdomen.  Soft, bowel sounds appropriate.    ED Results / Procedures / Treatments   Labs (all labs ordered are listed, but only abnormal results are displayed) Labs Reviewed  URINALYSIS, ROUTINE W REFLEX MICROSCOPIC - Abnormal; Notable for the following components:      Result Value   Color, Urine STRAW (*)    APPearance CLEAR (*)    All other components within normal limits  COMPREHENSIVE  METABOLIC PANEL - Abnormal; Notable for the following components:   ALT 45 (*)    All other components within normal limits  CBC WITH DIFFERENTIAL/PLATELET - Abnormal; Notable for the following components:   WBC 11.7 (*)    Monocytes Absolute 1.2 (*)    All other components within normal limits  SARS CORONAVIRUS 2 BY RT PCR  LIPASE, BLOOD    RADIOLOGY  CT abdomen pelvis with contrast obtained, interpreted the images as well as reviewed the radiology report.   Impression: Suspected small bowel enteritis. Normal appendix. No evidence of bowel obstruction. Status post left hemicolectomy.   PROCEDURES:  Critical Care performed: No  Procedures   MEDICATIONS ORDERED IN ED: Medications  morphine (PF) 2 MG/ML injection 2 mg (2 mg Intravenous Given 05/12/23 2029)  iohexol (OMNIPAQUE) 300 MG/ML solution 100 mL (100 mLs Intravenous Contrast Given 05/12/23 2034)     IMPRESSION / MDM / ASSESSMENT AND PLAN / ED COURSE  I reviewed the triage vital signs and the nursing notes.  27 year old male presents for evaluation of abdominal pain.  Vital signs stable in triage, patient NAD on exam.  Differential diagnosis includes, but is not limited to, appendicitis, small bowel obstruction, colitis, diverticulitis, pancreatitis.  Patient's presentation is most consistent with acute complicated illness / injury requiring diagnostic workup.  CBC shows very mildly elevated white count.  CMP unremarkable.  Lipase WNL.  Urinalysis WNL.  CT abdomen pelvis with contrast obtained, interpreted the images as well as reviewed the radiologist report.  Images were negative aside from some fat stranding surrounding the small bowel which radiology felt was consistent with small bowel enteritis.  There is no evidence of appendicitis or bowel obstruction.  I will send patient a prescription for some Zofran.  He was advised on pain management using Tylenol and ibuprofen.  Patient will  be given a work note.    Patient's mother requested a COVID test as patient has been sick for the past few days and they have a family member who is immunocompromised.  We will complete the test before patient's discharge and he can follow-up on MyChart for results.  He voiced understanding, all questions were answered and he was stable at discharge.     FINAL CLINICAL IMPRESSION(S) / ED DIAGNOSES   Final diagnoses:  Enteritis of small bowel     Rx / DC Orders   ED Discharge Orders          Ordered    ondansetron (ZOFRAN-ODT) 8 MG disintegrating tablet  Every 8 hours PRN        05/12/23 2322             Note:  This document was prepared using Dragon voice recognition software and may include unintentional dictation errors.   Cameron Ali, PA-C 05/12/23 2331    Trinna Post, MD 05/12/23 [phone]

## 2023-05-12 NOTE — ED Provider Triage Note (Signed)
Emergency Medicine Provider Triage Evaluation Note  Timothy [name] , a 27 y.o. male  was evaluated in triage.  Pt complains of abdominal pain. Pain began this morning. Pain is constant with bouts of increasing pain that come and go. Pain started in the RLQ and now radiates across his stomach.   PMH of diverticulitis, s/p collectomy.  Review of Systems  Positive: Abdominal pain, nausea, vomiting, constipation Negative: Diarrhea, urinary symptoms  Physical Exam  There were no vitals taken for this visit. Gen:   Awake, in pain Resp:  Normal effort  MSK:   Moves extremities without difficulty  Other:    Medical Decision Making  Medically screening exam initiated at 3:57 PM.  Appropriate orders placed.  Timothy [name] was informed that the remainder of the evaluation will be completed by another provider, this initial triage assessment does not replace that evaluation, and the importance of remaining in the ED until their evaluation is complete.     Cameron Ali, PA-C 05/12/23 1604

## 2023-05-12 NOTE — ED Notes (Signed)
Pt to CT

## 2023-05-12 NOTE — ED Notes (Signed)
Pt verbalizes understanding of discharge instructions.

## 2023-05-13 LAB — SARS CORONAVIRUS 2 BY RT PCR: SARS Coronavirus 2 by RT PCR: NEGATIVE

## 2023-05-24 ENCOUNTER — Emergency Department
Admission: EM | Admit: 2023-05-24 | Discharge: 2023-05-24 | Disposition: A | Discharge status: Home / Self Care | Payer: 59 | Attending: Emergency Medicine | Admitting: Emergency Medicine

## 2023-05-24 ENCOUNTER — Other Ambulatory Visit: Payer: Self-pay

## 2023-05-24 ENCOUNTER — Emergency Department: Payer: 59

## 2023-05-24 ENCOUNTER — Encounter: Payer: Self-pay | Admitting: Emergency Medicine

## 2023-05-24 DIAGNOSIS — S91332A Puncture wound without foreign body, left foot, initial encounter: Secondary | ICD-10-CM | POA: Diagnosis not present

## 2023-05-24 DIAGNOSIS — W450XXA Nail entering through skin, initial encounter: Secondary | ICD-10-CM | POA: Insufficient documentation

## 2023-05-24 DIAGNOSIS — Y990 Civilian activity done for income or pay: Secondary | ICD-10-CM | POA: Insufficient documentation

## 2023-05-24 DIAGNOSIS — Z23 Encounter for immunization: Secondary | ICD-10-CM | POA: Diagnosis not present

## 2023-05-24 DIAGNOSIS — S99922A Unspecified injury of left foot, initial encounter: Secondary | ICD-10-CM | POA: Diagnosis not present

## 2023-05-24 MED ORDER — CIPROFLOXACIN HCL 500 MG PO TABS: 500.0000 mg | ORAL_TABLET | Freq: Two times a day (BID) | ORAL | 0 refills | Status: AC

## 2023-05-24 MED ORDER — TETANUS-DIPHTH-ACELL PERTUSSIS 5-2.5-18.5 LF-MCG/0.5 IM SUSY
0.5000 mL | PREFILLED_SYRINGE | Freq: Once | INTRAMUSCULAR | Status: AC
  Administered 2023-05-24: 0.5 mL via INTRAMUSCULAR
  Filled 2023-05-24: qty 0.5

## 2023-05-24 NOTE — ED Triage Notes (Signed)
Pt presents ambulatory to triage via POV with complaints of puncture wound on the bottom of his L foot ~ 30 mins ago from a nail. Bleeding is controlled without intervention. Rates the pain 6/10 - not UTD on tetanus shot. A&Ox4 at this time.

## 2023-05-24 NOTE — ED Provider Notes (Signed)
[facility] Provider Note    Event Date/Time   First MD Initiated Contact with Patient 05/24/23 [phone]     (approximate)   History   Chief Complaint Foot Injury   HPI  Timothy [name] is a 27 y.o. male with past medical history of hyperlipidemia and GERD who presents to the ED complaining of foot injury.  Patient reports that just prior to arrival he stepped on a rusty nail at work that went through his boot and into his foot.  He reports a small puncture wound to the base of his left foot, has had a small amount of bleeding since the injury.  He is unsure of his last tetanus shot.     Physical Exam   Triage Vital Signs: ED Triage Vitals [05/24/23 0329]  Encounter Vitals Group     BP      Systolic BP Percentile      Diastolic BP Percentile      Pulse      Resp      Temp      Temp src      SpO2      Weight 260 lb (117.9 kg)     Height 6' (1.829 m)     Head Circumference      Peak Flow      Pain Score 6     Pain Loc      Pain Education      Exclude from Growth Chart     Most recent vital signs: Vitals:   05/24/23 0332  BP: (!) 140/83  Pulse: 96  Resp: 20  Temp: 97.8 F (36.6 C)  SpO2: 99%    Constitutional: Alert and oriented. Eyes: Conjunctivae are normal. Head: Atraumatic. Nose: No congestion/rhinnorhea. Mouth/Throat: Mucous membranes are moist.  Cardiovascular: Normal rate, regular rhythm. Grossly normal heart sounds.  2+ radial pulses bilaterally. Respiratory: Normal respiratory effort.  No retractions. Lungs CTAB. Gastrointestinal: Soft and nontender. No distention. Musculoskeletal: No lower extremity tenderness nor edema.  Puncture wound to the base of left foot with no active bleeding. Neurologic:  Normal speech and language. No gross focal neurologic deficits are appreciated.    ED Results / Procedures / Treatments   Labs (all labs ordered are listed, but only abnormal results are displayed) Labs Reviewed - No data to  display  RADIOLOGY Left foot x-ray reviewed and interpreted by me with no fracture or foreign body.  PROCEDURES:  Critical Care performed: No  Procedures   MEDICATIONS ORDERED IN ED: Medications  Tdap (BOOSTRIX) injection 0.5 mL (0.5 mLs Intramuscular Given 05/24/23 0347)     IMPRESSION / MDM / ASSESSMENT AND PLAN / ED COURSE  I reviewed the triage vital signs and the nursing notes.                              27 y.o. male with past medical history of hyperlipidemia and GERD who presents to the ED complaining of puncture wound to his left foot from nail that went through his boot.  Patient's presentation is most consistent with acute complicated illness / injury requiring diagnostic workup.  Differential diagnosis includes, but is not limited to, puncture wound, foreign body, fracture.  Patient well-appearing and in no acute distress, vital signs are unremarkable.  He has a small puncture wound to the bottom of his left foot, x-ray shows no evidence of foreign body or fracture.  His tetanus was  updated, wound irrigated with normal saline.  He is appropriate for outpatient management, will prescribe Cipro for antibiotic prophylaxis given need for pseudomonal coverage.  He was counseled to return to the ED for new or worsening symptoms, patient agrees with plan.      FINAL CLINICAL IMPRESSION(S) / ED DIAGNOSES   Final diagnoses:  Puncture wound of left foot, initial encounter     Rx / DC Orders   ED Discharge Orders          Ordered    ciprofloxacin (CIPRO) 500 MG tablet  2 times daily        05/24/23 0428             Note:  This document was prepared using Dragon voice recognition software and may include unintentional dictation errors.   Chesley Noon, MD 05/24/23 [phone]

## 2023-06-13 DIAGNOSIS — R079 Chest pain, unspecified: Secondary | ICD-10-CM | POA: Diagnosis not present

## 2023-06-13 DIAGNOSIS — F410 Panic disorder [episodic paroxysmal anxiety] without agoraphobia: Secondary | ICD-10-CM | POA: Diagnosis not present

## 2023-06-13 DIAGNOSIS — Z5321 Procedure and treatment not carried out due to patient leaving prior to being seen by health care provider: Secondary | ICD-10-CM | POA: Diagnosis not present

## 2023-06-30 ENCOUNTER — Telehealth (INDEPENDENT_AMBULATORY_CARE_PROVIDER_SITE_OTHER): Payer: 59 | Admitting: Internal Medicine

## 2023-06-30 ENCOUNTER — Ambulatory Visit: Payer: 59 | Admitting: Internal Medicine

## 2023-06-30 ENCOUNTER — Encounter: Payer: Self-pay | Admitting: Internal Medicine

## 2023-06-30 DIAGNOSIS — J069 Acute upper respiratory infection, unspecified: Secondary | ICD-10-CM | POA: Diagnosis not present

## 2023-06-30 DIAGNOSIS — J00 Acute nasopharyngitis [common cold]: Secondary | ICD-10-CM

## 2023-06-30 MED ORDER — AMOXICILLIN-POT CLAVULANATE 400-57 MG/5ML PO SUSR: ORAL | 0 refills | Status: DC

## 2023-06-30 NOTE — Progress Notes (Signed)
Virtual Visit via Video Note  I connected with Timothy [name] on 06/30/23 at 11:20 AM EDT by a video enabled telemedicine application and verified that I am speaking with the correct person using two identifiers.  Location: Patient: In his car Provider: Office  Persons participating in this video call: [name], NP and Timothy [name]   I discussed the limitations of evaluation and management by telemedicine and the availability of in person appointments. The patient expressed understanding and agreed to proceed.  History of Present Illness:  Pt reports sore throat, cough, chest congestion, vomiting and diarrhea. He reports this started 2 weeks ago. He feels like he is swallowing "sand paper". The cough is productive of green mucous. He denies runny nose, nasal congestion, ear pain, shortness of breath. He has had fevers but denies chills or body aches. He has treated with OTC medications with minimal relief of symptoms. He has had sick contacts.  Past Medical History:  Diagnosis Date   Allergy    Anxiety    Depression    Diverticulitis    Dyspnea    Fainting spell    in middle school   Family history of bone cancer    Family history of breast cancer    Hyperlipidemia     Current Outpatient Medications  Medication Sig Dispense Refill   cetirizine HCl (ZYRTEC) 5 MG/5ML SOLN Take 10 mLs (10 mg total) by mouth daily for 14 days. 140 mL 0   fluticasone (FLONASE) 50 MCG/ACT nasal spray Place 2 sprays into both nostrils daily. 16 g 6   ondansetron (ZOFRAN-ODT) 8 MG disintegrating tablet Take 1 tablet (8 mg total) by mouth every 8 (eight) hours as needed for nausea or vomiting. 20 tablet 0   No current facility-administered medications for this visit.    No Known Allergies  Family History  Problem Relation Age of Onset   Alcohol abuse Mother    Depression Mother    Cervical cancer Mother    Depression Father    Anxiety disorder Father    Healthy Sister    Alport syndrome  Maternal Aunt    Heart disease Maternal Grandmother    Diabetes Maternal Grandmother    Cancer Maternal Grandmother        breast cancer   Alport syndrome Maternal Grandmother    Cancer Paternal Grandfather        Bone cancer    Social History   Socioeconomic History   Marital status: Single    Spouse name: Not on file   Number of children: Not on file   Years of education: Not on file   Highest education level: High school graduate  Occupational History   Not on file  Tobacco Use   Smoking status: Former    Current packs/day: 0.25    Types: Cigarettes   Smokeless tobacco: Former    Types: Snuff   Tobacco comments:    pt smoke pack a week  Vaping Use   Vaping status: Every Day   Last attempt to quit: 09/09/2019   Substances: Nicotine, Flavoring  Substance and Sexual Activity   Alcohol use: Not Currently    Alcohol/week: 4.0 standard drinks of alcohol    Types: 4 Cans of beer per week    Comment: occassionally   Drug use: No   Sexual activity: Not Currently  Other Topics Concern   Not on file  Social History Narrative   Not on file   Social Determinants of Health   Financial  Resource Strain: Not on file  Food Insecurity: Not on file  Transportation Needs: Not on file  Physical Activity: Not on file  Stress: Not on file  Social Connections: Not on file  Intimate Partner Violence: Not on file     Constitutional: Pt reports fever. Denies malaise, fatigue, headache or abrupt weight changes.  HEENT: Patient reports sore throat.  Denies eye pain, eye redness, ear pain, ringing in the ears, wax buildup, runny nose, nasal congestion, bloody nose. Respiratory: Pt reports cough. Denies difficulty breathing, shortness of breath.   Cardiovascular: Denies chest pain, chest tightness, palpitations or swelling in the hands or feet.  Gastrointestinal: Pt reports vomiting and diarrhea. Denies abdominal pain, bloating, constipation, or blood in the stool.   No other specific  complaints in a complete review of systems (except as listed in HPI above).    Observations/Objective:   Wt Readings from Last 3 Encounters:  05/24/23 260 lb (117.9 kg)  03/10/23 270 lb 1 oz (122.5 kg)  03/02/23 270 lb (122.5 kg)    General: Appears his stated age, obese, in NAD. HEENT: Head: normal shape and size; Nose: no congestion noted; Throat/Mouth: hoarseness noted Pulmonary/Chest: Normal effort. No respiratory distress.   Neurological: Alert and oriented.   BMET    Component Value Date/Time   NA 136 05/12/2023 1607   NA 138 12/16/2014 0839   K 3.9 05/12/2023 1607   K 3.9 12/16/2014 0839   CL 104 05/12/2023 1607   CL 106 12/16/2014 0839   CO2 23 05/12/2023 1607   CO2 26 12/16/2014 0839   GLUCOSE 86 05/12/2023 1607   GLUCOSE 110 (H) 12/16/2014 0839   BUN 16 05/12/2023 1607   BUN 11 12/16/2014 0839   CREATININE 0.80 05/12/2023 1607   CREATININE 0.97 07/29/2022 0905   CALCIUM 9.0 05/12/2023 1607   CALCIUM 8.8 (L) 12/16/2014 0839   GFRNONAA >60 05/12/2023 1607   GFRNONAA >60 12/16/2014 0839   GFRAA >60 12/09/2019 0555   GFRAA >60 12/16/2014 0839    Lipid Panel     Component Value Date/Time   CHOL 160 07/29/2022 0905   TRIG 166 (H) 07/29/2022 0905   HDL 35 (L) 07/29/2022 0905   CHOLHDL 4.6 07/29/2022 0905   LDLCALC 99 07/29/2022 0905    CBC    Component Value Date/Time   WBC 11.7 (H) 05/12/2023 1607   RBC 5.22 05/12/2023 1607   HGB 15.1 05/12/2023 1607   HGB 14.6 12/16/2014 0839   HCT 45.5 05/12/2023 1607   HCT 44.0 12/16/2014 0839   PLT 230 05/12/2023 1607   PLT 233 12/16/2014 0839   MCV 87.2 05/12/2023 1607   MCV 88 12/16/2014 0839   MCH 28.9 05/12/2023 1607   MCHC 33.2 05/12/2023 1607   RDW 12.6 05/12/2023 1607   RDW 13.8 12/16/2014 0839   LYMPHSABS 3.3 05/12/2023 1607   LYMPHSABS 2.9 12/16/2014 0839   MONOABS 1.2 (H) 05/12/2023 1607   MONOABS 1.2 (H) 12/16/2014 0839   EOSABS 0.1 05/12/2023 1607   EOSABS 0.3 12/16/2014 0839   BASOSABS  0.0 05/12/2023 1607   BASOSABS 0.1 12/16/2014 0839    Hgb A1C Lab Results  Component Value Date   HGBA1C 5.6 07/29/2022       Assessment and Plan:  Upper respiratory infection:  Encouraged rest and fluids RX for augmentin 875-125 mg BID x 10 day Recommend zyrtec and flonase OTC  Schedule an appointment for your annual exam  Follow Up Instructions:    I discussed the  assessment and treatment plan with the patient. The patient was provided an opportunity to ask questions and all were answered. The patient agreed with the plan and demonstrated an understanding of the instructions.   The patient was advised to call back or seek an in-person evaluation if the symptoms worsen or if the condition fails to improve as anticipated.    [name], NP

## 2023-06-30 NOTE — Patient Instructions (Signed)
 Sore Throat  When you have a sore throat, your throat may feel:  Tender.  Burning.  Irritated.  Scratchy.  Painful when you swallow.  Painful when you talk.  Many things can cause a sore throat, such as:  An infection.  Allergies.  Dry air.  Smoke or pollution.  Radiation treatment for cancer.  Gastroesophageal reflux disease (GERD).  A tumor.  A sore throat can be the first sign of another sickness. It can happen with other problems, like:  Coughing.  Sneezing.  Fever.  Swelling of the glands in the neck.  Most sore throats go away without treatment.  Follow these instructions at home:         Medicines  Take over-the-counter and prescription medicines only as told by your doctor.  Children often get sore throats. Do not give your child aspirin.  Use throat sprays to soothe your throat as told by your health care provider.  Managing pain  To help with pain:  Sip warm liquids, such as broth, herbal tea, or warm water.  Eat or drink cold or frozen liquids, such as frozen ice pops.  Rinse your mouth (gargle) with a salt water mixture 3-4 times a day or as needed.  To make salt water, dissolve -1 tsp (3-6 g) of salt in 1 cup (237 mL) of warm water.  Do not swallow this mixture.  Suck on hard candy or throat lozenges.  Put a cool-mist humidifier in your bedroom at night.  Sit in the bathroom with the door closed for 5-10 minutes while you run hot water in the shower.  General instructions  Do not smoke or use any products that contain nicotine or tobacco. If you need help quitting, ask your doctor.  Get plenty of rest.  Drink enough fluid to keep your pee (urine) pale yellow.  Wash your hands often for at least 20 seconds with soap and water. If soap and water are not available, use hand sanitizer.  Contact a doctor if:  You have a fever for more than 2-3 days.  You keep having symptoms for more than 2-3 days.  Your throat does not get better in 7 days.  You have a fever and your symptoms suddenly get worse.  Your  child who is 3 months to 64 years old has a temperature of 102.33F (39C) or higher.  Get help right away if:  You have trouble breathing.  You cannot swallow fluids, soft foods, or your spit.  You have swelling in your throat or neck that gets worse.  You feel like you may vomit (nauseous) and this feeling lasts a long time.  You cannot stop vomiting.  These symptoms may be an emergency. Get help right away. Call your local emergency services (911 in the [place]  Do not wait to see if the symptoms will go away.  Do not drive yourself to the hospital.  Summary  A sore throat is a painful, burning, irritated, or scratchy throat. Many things can cause a sore throat.  Take over-the-counter medicines only as told by your doctor.  Get plenty of rest.  Drink enough fluid to keep your pee (urine) pale yellow.  Contact a doctor if your symptoms get worse or your sore throat does not get better within 7 days.  This information is not intended to replace advice given to you by your health care provider. Make sure you discuss any questions you have with your health care provider.  Document Revised: 11/11/2020 Document  Reviewed: 11/11/2020  Elsevier Patient Education  2024 [employer].

## 2023-08-09 ENCOUNTER — Emergency Department: Payer: 59

## 2023-08-09 ENCOUNTER — Emergency Department
Admission: EM | Admit: 2023-08-09 | Discharge: 2023-08-10 | Disposition: A | Discharge status: Home / Self Care | Payer: 59 | Attending: Emergency Medicine | Admitting: Emergency Medicine

## 2023-08-09 ENCOUNTER — Other Ambulatory Visit: Payer: Self-pay

## 2023-08-09 ENCOUNTER — Encounter: Payer: Self-pay | Admitting: Emergency Medicine

## 2023-08-09 DIAGNOSIS — R0602 Shortness of breath: Secondary | ICD-10-CM | POA: Diagnosis not present

## 2023-08-09 DIAGNOSIS — R110 Nausea: Secondary | ICD-10-CM | POA: Diagnosis not present

## 2023-08-09 DIAGNOSIS — R0981 Nasal congestion: Secondary | ICD-10-CM | POA: Diagnosis not present

## 2023-08-09 DIAGNOSIS — R42 Dizziness and giddiness: Secondary | ICD-10-CM | POA: Insufficient documentation

## 2023-08-09 DIAGNOSIS — R079 Chest pain, unspecified: Secondary | ICD-10-CM | POA: Insufficient documentation

## 2023-08-09 DIAGNOSIS — R059 Cough, unspecified: Secondary | ICD-10-CM | POA: Insufficient documentation

## 2023-08-09 DIAGNOSIS — F419 Anxiety disorder, unspecified: Secondary | ICD-10-CM | POA: Insufficient documentation

## 2023-08-09 DIAGNOSIS — E876 Hypokalemia: Secondary | ICD-10-CM | POA: Diagnosis not present

## 2023-08-09 DIAGNOSIS — Z1152 Encounter for screening for COVID-19: Secondary | ICD-10-CM | POA: Insufficient documentation

## 2023-08-09 DIAGNOSIS — R0789 Other chest pain: Secondary | ICD-10-CM | POA: Diagnosis not present

## 2023-08-09 LAB — CBC
HCT: 43.9 % (ref 39.0–52.0)
Hemoglobin: 14.9 g/dL (ref 13.0–17.0)
MCH: 29.9 pg (ref 26.0–34.0)
MCHC: 33.9 g/dL (ref 30.0–36.0)
MCV: 88 fL (ref 80.0–100.0)
Platelets: 244 10*3/uL (ref 150–400)
RBC: 4.99 MIL/uL (ref 4.22–5.81)
RDW: 12.9 % (ref 11.5–15.5)
WBC: 10.4 10*3/uL (ref 4.0–10.5)
nRBC: 0 % (ref 0.0–0.2)

## 2023-08-09 LAB — RESP PANEL BY RT-PCR (RSV, FLU A&B, COVID)  RVPGX2
Influenza A by PCR: NEGATIVE
Influenza B by PCR: NEGATIVE
Resp Syncytial Virus by PCR: NEGATIVE
SARS Coronavirus 2 by RT PCR: NEGATIVE

## 2023-08-09 LAB — BASIC METABOLIC PANEL
Anion gap: 8 (ref 5–15)
BUN: 14 mg/dL (ref 6–20)
CO2: 26 mmol/L (ref 22–32)
Calcium: 8.9 mg/dL (ref 8.9–10.3)
Chloride: 102 mmol/L (ref 98–111)
Creatinine, Ser: 0.98 mg/dL (ref 0.61–1.24)
GFR, Estimated: >60 mL/min (ref >60)
Glucose, Bld: 115 mg/dL — ABNORMAL HIGH (ref 70–99)
Potassium: 3.3 mmol/L — ABNORMAL LOW (ref 3.5–5.1)
Sodium: 136 mmol/L (ref 135–145)

## 2023-08-09 LAB — TROPONIN I (HIGH SENSITIVITY): Troponin I (High Sensitivity): 6 ng/L (ref <18)

## 2023-08-09 NOTE — ED Triage Notes (Signed)
Patient ambulatory to triage without distress with complaints of worsening shortness of breath all day, with chest pain starting just prior to arrival. Patient also states accompanying dizziness and nausea while at work tonight.

## 2023-08-10 LAB — TROPONIN I (HIGH SENSITIVITY): Troponin I (High Sensitivity): 4 ng/L (ref <18)

## 2023-08-10 LAB — D-DIMER, QUANTITATIVE: D-Dimer, Quant: <0.27 ug{FEU}/mL (ref 0.00–0.50)

## 2023-08-10 MED ORDER — LORAZEPAM 1 MG PO TABS: 1.0000 mg | ORAL_TABLET | Freq: Once | ORAL | Status: DC

## 2023-08-10 MED ORDER — POTASSIUM CHLORIDE CRYS ER 20 MEQ PO TBCR
40.0000 meq | EXTENDED_RELEASE_TABLET | Freq: Once | ORAL | Status: DC
  Filled 2023-08-10: qty 2

## 2023-08-10 MED ORDER — SODIUM CHLORIDE 0.9 % IV BOLUS
500.0000 mL | Freq: Once | INTRAVENOUS | Status: AC
  Administered 2023-08-10: 500 mL via INTRAVENOUS

## 2023-08-10 MED ORDER — POTASSIUM CHLORIDE 20 MEQ PO PACK
40.0000 meq | PACK | Freq: Once | ORAL | Status: AC
  Administered 2023-08-10: 40 meq via ORAL
  Filled 2023-08-10: qty 2

## 2023-08-10 MED ORDER — HYDROXYZINE HCL 10 MG/5ML PO SYRP
10.0000 mg | ORAL_SOLUTION | Freq: Once | ORAL | Status: AC
  Administered 2023-08-10: 10 mg via ORAL
  Filled 2023-08-10: qty 5

## 2023-08-10 NOTE — ED Notes (Signed)
 Pt admitted to [facility]

## 2023-08-10 NOTE — Discharge Instructions (Signed)
Return to the ER for worsening symptoms, persistent vomiting, difficulty breathing or other concerns. °

## 2023-08-10 NOTE — ED Provider Notes (Signed)
[facility] Provider Note    Event Date/Time   First MD Initiated Contact with Patient 08/10/23 0001     (approximate)   History   Shortness of Breath and Chest Pain   HPI  Timothy [name] is a 27 y.o. male who presents to the ED from home with a chief complaint of chest pain and shortness of breath all day.  Reports right-sided chest pain, stabbing times several seconds.  Also noted dizziness and nausea while at work tonight.  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  Also recent trip to [place].  Also recent cough and congestion symptoms.  Denies fever/chills, abdominal pain, nausea, vomiting or diarrhea.     Past Medical History   Past Medical History:  Diagnosis Date   Allergy    Anxiety    Depression    Diverticulitis    Dyspnea    Fainting spell    in middle school   Family history of bone cancer    Family history of breast cancer    Hyperlipidemia      Active Problem List   Patient Active Problem List   Diagnosis Date Noted   Hypertriglyceridemia 06/23/2022   Insomnia 06/23/2022   Gastroesophageal reflux disease without esophagitis 01/25/2022   Class 2 obesity due to excess calories with body mass index (BMI) of 35.0 to 35.9 in adult 03/29/2021   Anxiety and depression 07/28/2017     Past Surgical History   Past Surgical History:  Procedure Laterality Date   COLECTOMY WITH COLOSTOMY CREATION/HARTMANN PROCEDURE N/A 07/22/2018   Procedure: COLECTOMY WITH COLOSTOMY CREATION/HARTMANN PROCEDURE;  Surgeon: Duanne Guess, MD;  Location: [facility] ORS;  Service: General;  Laterality: N/A;   COLONOSCOPY WITH PROPOFOL N/A 11/01/2018   Procedure: COLONOSCOPY WITH PROPOFOL;  Surgeon: Wyline Mood, MD;  Location: [facility] ENDOSCOPY;  Service: Gastroenterology;  Laterality: N/A;   COLONOSCOPY WITH PROPOFOL N/A 05/26/2022   Procedure: COLONOSCOPY WITH PROPOFOL;  Surgeon: Wyline Mood, MD;  Location: [facility] ENDOSCOPY;  Service:  Gastroenterology;  Laterality: N/A;   COLOSTOMY REVERSAL N/A 03/22/2019   Procedure: COLOSTOMY REVERSAL;  Surgeon: Duanne Guess, MD;  Location: [facility] ORS;  Service: General;  Laterality: N/A;   ESOPHAGOGASTRODUODENOSCOPY N/A 05/26/2022   Procedure: ESOPHAGOGASTRODUODENOSCOPY (EGD);  Surgeon: Wyline Mood, MD;  Location: [facility] ENDOSCOPY;  Service: Gastroenterology;  Laterality: N/A;     Home Medications   Prior to Admission medications   Medication Sig Start Date End Date Taking? Authorizing Provider  amoxicillin-clavulanate (AUGMENTIN) 400-57 MG/5ML suspension Take 10 mL PO BID x 10 days 06/30/23   Lorre Munroe, NP  cetirizine HCl (ZYRTEC) 5 MG/5ML SOLN Take 10 mLs (10 mg total) by mouth daily for 14 days. 12/27/22 01/10/23  Lorre Munroe, NP  fluticasone (FLONASE) 50 MCG/ACT nasal spray Place 2 sprays into both nostrils daily. 12/27/22   Lorre Munroe, NP  ondansetron (ZOFRAN-ODT) 8 MG disintegrating tablet Take 1 tablet (8 mg total) by mouth every 8 (eight) hours as needed for nausea or vomiting. 05/12/23   Cameron Ali, PA-C     Allergies  Patient has no known allergies.   Family History   Family History  Problem Relation Age of Onset   Alcohol abuse Mother    Depression Mother    Cervical cancer Mother    Depression Father    Anxiety disorder Father    Healthy Sister    Alport syndrome Maternal Aunt    Heart disease Maternal Grandmother  Diabetes Maternal Grandmother    Cancer Maternal Grandmother        breast cancer   Alport syndrome Maternal Grandmother    Cancer Paternal Grandfather        Bone cancer     Physical Exam  Triage Vital Signs: ED Triage Vitals  Encounter Vitals Group     BP 08/09/23 2029 (!) 147/91     Systolic BP Percentile --      Diastolic BP Percentile --      Pulse Rate 08/09/23 2029 88     Resp 08/09/23 2029 18     Temp 08/09/23 2029 98.7 F (37.1 C)     Temp Source 08/09/23 2029 Oral     SpO2 08/09/23 2029 98 %     Weight  08/09/23 2030 280 lb (127 kg)     Height 08/09/23 2030 6' (1.829 m)     Head Circumference --      Peak Flow --      Pain Score 08/09/23 2030 5     Pain Loc --      Pain Education --      Exclude from Growth Chart --     Updated Vital Signs: BP 124/74   Pulse 84   Temp 98.5 F (36.9 C) (Oral)   Resp 17   Ht 6' (1.829 m)   Wt 127 kg   SpO2 100%   BMI 37.97 kg/m    General: Awake, no distress.  CV:  RRR.  Good peripheral perfusion.  Resp:  Normal effort.  CTAB. Abd:  Nontender.  No distention.  Other:  No carotid bruits.  Alert and oriented x 3.  CN II-XII grossly intact.  5/5 motor strength and sensation all extremities.  MAE x 4.   ED Results / Procedures / Treatments  Labs (all labs ordered are listed, but only abnormal results are displayed) Labs Reviewed  BASIC METABOLIC PANEL - Abnormal; Notable for the following components:      Result Value   Potassium 3.3 (*)    Glucose, Bld 115 (*)    All other components within normal limits  RESP PANEL BY RT-PCR (RSV, FLU A&B, COVID)  RVPGX2  CBC  D-DIMER, QUANTITATIVE  TROPONIN I (HIGH SENSITIVITY)  TROPONIN I (HIGH SENSITIVITY)     EKG  ED ECG REPORT I, Spero Gunnels J, the attending physician, personally viewed and interpreted this ECG.   Date: 08/10/2023  EKG Time: 2032  Rate: 92  Rhythm: normal sinus rhythm  Axis: Normal  Intervals:none  ST&T Change: Nonspecific    RADIOLOGY I have independently visualized and interpreted patient's imaging study as well as noted the radiology interpretation:  X-ray: No acute cardiopulmonary process  Official radiology report(s): DG Chest 2 View Result Date: 08/09/2023 CLINICAL DATA:  Shortness of breath, chest pain EXAM: CHEST - 2 VIEW COMPARISON:  02/21/2023 FINDINGS: The heart size and mediastinal contours are within normal limits. Both lungs are clear. The visualized skeletal structures are unremarkable. IMPRESSION: Normal study. Electronically Signed   By: Charlett Nose M.D.   On: 08/09/2023 21:15     PROCEDURES:  Critical Care performed: No  .1-3 Lead EKG Interpretation  Performed by: Irean Hong, MD Authorized by: Irean Hong, MD     Interpretation: normal     ECG rate:  85   ECG rate assessment: normal     Rhythm: sinus rhythm     Ectopy: none     Conduction: normal   Comments:  Patient placed on cardiac monitor to evaluate for arrhythmias    MEDICATIONS ORDERED IN ED: Medications  sodium chloride 0.9 % bolus 500 mL (500 mLs Intravenous New Bag/Given 08/10/23 0137)  potassium chloride (KLOR-CON) packet 40 mEq (40 mEq Oral Given 08/10/23 0233)     IMPRESSION / MDM / ASSESSMENT AND PLAN / ED COURSE  I reviewed the triage vital signs and the nursing notes.                             27 year old male presenting with chest pain and shortness of breath. Differential diagnosis includes, but is not limited to, ACS, aortic dissection, pulmonary embolism, cardiac tamponade, pneumothorax, pneumonia, pericarditis, myocarditis, GI-related causes including esophagitis/gastritis, and musculoskeletal chest wall pain.   I personally reviewed patient's records and note a PCP video visit on 06/30/2023 for acute nasopharyngitis.  Patient's presentation is most consistent with acute illness / injury with system symptoms.  The patient is on the cardiac monitor to evaluate for evidence of arrhythmia and/or significant heart rate changes.  Laboratory results demonstrate mild hypokalemia with potassium 3.3, respiratory panel and chest x-ray negative.  Will obtain repeat troponin, add D-dimer given patient's recent trip [place] west.  Replete potassium orally, administer IV fluid hydration and reassess.  Clinical Course as of 08/10/23 0243  Thu Aug 10, 2023  4098 Delay due to computer downtime.  Repeat troponin and D-dimer negative.  Patient overall feeling better but now states he is having an anxiety attack and requesting anxiolytic.  Strict return  precautions given.  Patient verbalizes understanding and agrees with plan of care. [JS]    Clinical Course User Index [JS] Irean Hong, MD     FINAL CLINICAL IMPRESSION(S) / ED DIAGNOSES   Final diagnoses:  Hypokalemia  Chest pain, unspecified type     Rx / DC Orders   ED Discharge Orders     None        Note:  This document was prepared using Dragon voice recognition software and may include unintentional dictation errors.   Irean Hong, MD 08/10/23 [phone]

## 2023-08-12 ENCOUNTER — Telehealth: Payer: 59 | Admitting: Emergency Medicine

## 2023-08-12 DIAGNOSIS — F32A Depression, unspecified: Secondary | ICD-10-CM | POA: Diagnosis not present

## 2023-08-12 DIAGNOSIS — F419 Anxiety disorder, unspecified: Secondary | ICD-10-CM

## 2023-08-12 NOTE — Patient Instructions (Signed)
  Timothy [name] Wellen, thank you for joining Cathlyn Parsons, NP for today's virtual visit.  While this provider is not your primary care provider (PCP), if your PCP is located in our provider database this encounter information will be shared with them immediately following your visit.   A [facility] MyChart account gives you access to today's visit and all your visits, tests, and labs performed at [facility] " click here if you don'[name] have a [facility] MyChart account or go to [email or web address]  Consent: (Patient) Timothy [name] Sudbeck provided verbal consent for this virtual visit at the beginning of the encounter.  Current Medications:  Current Outpatient Medications:    amoxicillin-clavulanate (AUGMENTIN) 400-57 MG/5ML suspension, Take 10 mL PO BID x 10 days, Disp: 200 mL, Rfl: 0   cetirizine HCl (ZYRTEC) 5 MG/5ML SOLN, Take 10 mLs (10 mg total) by mouth daily for 14 days., Disp: 140 mL, Rfl: 0   fluticasone (FLONASE) 50 MCG/ACT nasal spray, Place 2 sprays into both nostrils daily., Disp: 16 g, Rfl: 6   ondansetron (ZOFRAN-ODT) 8 MG disintegrating tablet, Take 1 tablet (8 mg total) by mouth every 8 (eight) hours as needed for nausea or vomiting., Disp: 20 tablet, Rfl: 0   Medications ordered in this encounter:  No orders of the defined types were placed in this encounter.    *If you need refills on other medications prior to your next appointment, please contact your pharmacy*  Follow-Up: Call back or seek an in-person evaluation if the symptoms worsen or if the condition fails to improve as anticipated.  [facility] [phone]  Other Instructions  Follow up with Ms. Sampson Si for cholesterol and diabetes lab work. Also talk with her about medications for anxiety and depression. You are medicating yourself with alcohol - I think you and Ms. Sampson Si can find a medicine that works better for you and is healthier for you.   As far as making changes to your diet and  exercise, I strongly recommend not trying to change everything at once. Try to set small, achievable goals. For example, if you eat fast food every meal, try to replace at one meal a day with something healthier (like the healthy choice meals you have tried). For an exercise example, start with trying to go to the gym (or walk or whatever activity you want to start with) 3 times a week for 15 minutes at a time.  Focus on small things you can be successful with and slowly build on it.  Don'[name] be discouraged if you have setbacks - just keep trying.    If you have been instructed to have an in-person evaluation today at a local Urgent Care facility, please use the link below. It will take you to a list of all of our available [facility], including address, phone number and hours of operation. Please do not delay care.  [facility]  If you or a family member do not have a primary care provider, use the link below to schedule a visit and establish care. When you choose a [facility] primary care physician or advanced practice provider, you gain a long-term partner in health. Find a Primary Care Provider  Learn more about [facility]'s in-office and virtual care options:  - Get Care Now

## 2023-08-12 NOTE — Progress Notes (Signed)
Virtual Visit Consent   Timothy [name], you are scheduled for a virtual visit with a [facility] provider today. Just as with appointments in the office, your consent must be obtained to participate. Your consent will be active for this visit and any virtual visit you may have with one of our providers in the next 365 days. If you have a MyChart account, a copy of this consent can be sent to you electronically.  As this is a virtual visit, video technology does not allow for your provider to perform a traditional examination. This may limit your provider's ability to fully assess your condition. If your provider identifies any concerns that need to be evaluated in person or the need to arrange testing (such as labs, EKG, etc.), we will make arrangements to do so. Although advances in technology are sophisticated, we cannot ensure that it will always work on either your end or our end. If the connection with a video visit is poor, the visit may have to be switched to a telephone visit. With either a video or telephone visit, we are not always able to ensure that we have a secure connection.  By engaging in this virtual visit, you consent to the provision of healthcare and authorize for your insurance to be billed (if applicable) for the services provided during this visit. Depending on your insurance coverage, you may receive a charge related to this service.  I need to obtain your verbal consent now. Are you willing to proceed with your visit today? Timothy T Denz has provided verbal consent on 08/12/2023 for a virtual visit (video or telephone). Cathlyn Parsons, NP  Date: 08/12/2023 1:32 PM  Virtual Visit via Video Note   I, Cathlyn Parsons, connected with  Timothy T Kraner  ([id number], 12/23/1995) on 08/12/23 at 12:45 PM EST by a video-enabled telemedicine application and verified that I am speaking with the correct person using two identifiers.  Location: Patient: Virtual Visit Location Patient:  Home Provider: Virtual Visit Location Provider: Home Office   I discussed the limitations of evaluation and management by telemedicine and the availability of in person appointments. The patient expressed understanding and agreed to proceed.    History of Present Illness: Timothy T Eskew is a 27 y.o. who identifies as a male who was assigned male at birth, and is being seen today for worried about his overall health. Was in ED 3 days ago with SOB and chest pain. PE and ACS r/o. Had some chest discomfort today - states would not describe it as pain. It resolved spontaneously. He has had this before. States he has seen a cardiologist - had a normal stress test last year. Has been told chest sx are anxiety - does admit to very high stress levels and has tried lexapro for anxiety in the past, not currently taking it. Since was in ED, had decided to make some lifestyle changes. Trying to eat health choice frozen meals instead of fast food and joined a gym but hasn't been in to exercise yet. Does vape. Uses alcohol to cope with stress. Drinks 2-3 "tall boys" 2 times a week. Rarely does he drink 8-9 12 oz beers in a setting because when he does, he doesn't feel well. Would like help with anxiety. In the past, his cholesterol is high and he is worried it is higher- would like it checked. Is worried he is borderline diabetic - wants this checked too. Is ready to make healthier choices.  HPI: HPI  Problems:  Patient Active Problem List   Diagnosis Date Noted   Hypertriglyceridemia 06/23/2022   Insomnia 06/23/2022   Gastroesophageal reflux disease without esophagitis 01/25/2022   Class 2 obesity due to excess calories with body mass index (BMI) of 35.0 to 35.9 in adult 03/29/2021   Anxiety and depression 07/28/2017    Allergies: No Known Allergies Medications:  Current Outpatient Medications:    amoxicillin-clavulanate (AUGMENTIN) 400-57 MG/5ML suspension, Take 10 mL PO BID x 10 days, Disp: 200 mL, Rfl:  0   cetirizine HCl (ZYRTEC) 5 MG/5ML SOLN, Take 10 mLs (10 mg total) by mouth daily for 14 days., Disp: 140 mL, Rfl: 0   fluticasone (FLONASE) 50 MCG/ACT nasal spray, Place 2 sprays into both nostrils daily., Disp: 16 g, Rfl: 6   ondansetron (ZOFRAN-ODT) 8 MG disintegrating tablet, Take 1 tablet (8 mg total) by mouth every 8 (eight) hours as needed for nausea or vomiting., Disp: 20 tablet, Rfl: 0  Observations/Objective: Patient is well-developed, well-nourished in no acute distress.  Resting comfortably  at home.  Head is normocephalic, atraumatic.  No labored breathing.  Speech is clear and coherent with logical content.  Patient is alert and oriented at baseline.  Does not voice suicidal sx; normal judgement and thought processes  Assessment and Plan: 1. Anxiety and depression (Primary)  Discussed making changes with idea of setting small, achievable goals. F/u with PCP for lab testing and discussion of anxiety treatment.   Follow Up Instructions: I discussed the assessment and treatment plan with the patient. The patient was provided an opportunity to ask questions and all were answered. The patient agreed with the plan and demonstrated an understanding of the instructions.  A copy of instructions were sent to the patient via MyChart unless otherwise noted below.   The patient was advised to call back or seek an in-person evaluation if the symptoms worsen or if the condition fails to improve as anticipated.    Cathlyn Parsons, NP

## 2023-08-17 ENCOUNTER — Other Ambulatory Visit: Payer: Self-pay

## 2023-08-17 ENCOUNTER — Emergency Department: Payer: 59

## 2023-08-17 ENCOUNTER — Emergency Department
Admission: EM | Admit: 2023-08-17 | Discharge: 2023-08-18 | Disposition: A | Discharge status: Home / Self Care | Payer: 59 | Attending: Emergency Medicine | Admitting: Emergency Medicine

## 2023-08-17 DIAGNOSIS — R058 Other specified cough: Secondary | ICD-10-CM | POA: Diagnosis not present

## 2023-08-17 DIAGNOSIS — R079 Chest pain, unspecified: Secondary | ICD-10-CM | POA: Diagnosis not present

## 2023-08-17 DIAGNOSIS — R0789 Other chest pain: Secondary | ICD-10-CM | POA: Diagnosis not present

## 2023-08-17 DIAGNOSIS — I10 Essential (primary) hypertension: Secondary | ICD-10-CM | POA: Diagnosis not present

## 2023-08-17 DIAGNOSIS — Z20822 Contact with and (suspected) exposure to covid-19: Secondary | ICD-10-CM | POA: Diagnosis not present

## 2023-08-17 DIAGNOSIS — Z716 Tobacco abuse counseling: Secondary | ICD-10-CM | POA: Insufficient documentation

## 2023-08-17 LAB — COMPREHENSIVE METABOLIC PANEL
ALT: 31 U/L (ref 0–44)
AST: 23 U/L (ref 15–41)
Albumin: 4.1 g/dL (ref 3.5–5.0)
Alkaline Phosphatase: 64 U/L (ref 38–126)
Anion gap: 11 (ref 5–15)
BUN: 13 mg/dL (ref 6–20)
CO2: 22 mmol/L (ref 22–32)
Calcium: 8.8 mg/dL — ABNORMAL LOW (ref 8.9–10.3)
Chloride: 105 mmol/L (ref 98–111)
Creatinine, Ser: 0.76 mg/dL (ref 0.61–1.24)
GFR, Estimated: >60 mL/min (ref >60)
Glucose, Bld: 113 mg/dL — ABNORMAL HIGH (ref 70–99)
Potassium: 3.5 mmol/L (ref 3.5–5.1)
Sodium: 138 mmol/L (ref 135–145)
Total Bilirubin: 0.4 mg/dL (ref <1.2)
Total Protein: 7.3 g/dL (ref 6.5–8.1)

## 2023-08-17 LAB — CBC
HCT: 43.5 % (ref 39.0–52.0)
Hemoglobin: 14.7 g/dL (ref 13.0–17.0)
MCH: 29.9 pg (ref 26.0–34.0)
MCHC: 33.8 g/dL (ref 30.0–36.0)
MCV: 88.4 fL (ref 80.0–100.0)
Platelets: 221 10*3/uL (ref 150–400)
RBC: 4.92 MIL/uL (ref 4.22–5.81)
RDW: 12.8 % (ref 11.5–15.5)
WBC: 9.5 10*3/uL (ref 4.0–10.5)
nRBC: 0 % (ref 0.0–0.2)

## 2023-08-17 LAB — TROPONIN I (HIGH SENSITIVITY): Troponin I (High Sensitivity): 4 ng/L (ref <18)

## 2023-08-17 LAB — LIPASE, BLOOD: Lipase: 35 U/L (ref 11–51)

## 2023-08-17 NOTE — ED Notes (Signed)
Pt now reporting lower abd pain

## 2023-08-17 NOTE — ED Triage Notes (Signed)
BIB [facility] from [facility]. Pt c/o substernal cp described as sharp shooting. Reports onset after eating a salad and laying down ~ 1 hr ago. Pt reports worsened with exertion. Pt states he was walking to the bathroom to have bm, which he hasn't had one in 2 days and typically has bm daily. Pt denies SOB. Pt alert and oriented on arrival. Breathing unlabored speaking in full sentences with symmetric chest rise and fall.   EMS VS:  164/102 HR 87 95% RA  No meds given en route.

## 2023-08-18 LAB — URINALYSIS, W/ REFLEX TO CULTURE (INFECTION SUSPECTED)
Bacteria, UA: NONE SEEN
Bilirubin Urine: NEGATIVE
Glucose, UA: NEGATIVE mg/dL
Hgb urine dipstick: NEGATIVE
Ketones, ur: NEGATIVE mg/dL
Leukocytes,Ua: NEGATIVE
Nitrite: NEGATIVE
Protein, ur: NEGATIVE mg/dL
Specific Gravity, Urine: 1.02 (ref 1.005–1.030)
Squamous Epithelial / HPF: 0 /[HPF] (ref 0–5)
pH: 6 (ref 5.0–8.0)

## 2023-08-18 LAB — RESP PANEL BY RT-PCR (RSV, FLU A&B, COVID)  RVPGX2
Influenza A by PCR: NEGATIVE
Influenza B by PCR: NEGATIVE
Resp Syncytial Virus by PCR: NEGATIVE
SARS Coronavirus 2 by RT PCR: NEGATIVE

## 2023-08-18 LAB — TROPONIN I (HIGH SENSITIVITY): Troponin I (High Sensitivity): 4 ng/L (ref <18)

## 2023-08-18 MED ORDER — NICOTINE 7 MG/24HR TD PT24: 7.0000 mg | MEDICATED_PATCH | Freq: Every day | TRANSDERMAL | 0 refills | Status: DC

## 2023-08-18 MED ORDER — NICOTINE POLACRILEX 4 MG MT LOZG: 4.0000 mg | LOZENGE | OROMUCOSAL | 0 refills | Status: DC | PRN

## 2023-08-18 NOTE — ED Notes (Signed)
 Patient discharged at this time. Ambulated to lobby with independent and steady gait. Breathing unlabored speaking in full sentences. Verbalized understanding of all discharge, follow up, and medication teaching. Discharged homed with all belongings. Declined final vs.

## 2023-08-18 NOTE — Discharge Instructions (Addendum)
Fortunately your testing in the emergency department did not show any emergent conditions that account for your symptoms.   Thank you for choosing [facility] for your health care today!  Please see your primary doctor this week for a follow up appointment.   If you have any new, worsening, or unexpected symptoms call your doctor right away or come back to the emergency department for reevaluation.  It was my pleasure to care for you today.   Daneil Dan Modesto Charon, MD

## 2023-08-18 NOTE — ED Provider Notes (Addendum)
[facility] Provider Note    Event Date/Time   First MD Initiated Contact with Patient 08/18/23 0005     (approximate)   History   Chest Pain   HPI  Timothy [name] is a 27 y.o. male   Past medical history of depression and anxiety, hyperlipidemia, vape use presents to the emergency department with intermittent sharp chest pain over the past couple of weeks.  He has a dry cough.  No fevers or chills.  No shortness of breath.  Denies abdominal pain.  Has some dysuria.  No testicular pain.  No other acute medical complaints.  Independent Historian contributed to assessment above: His mother is at bedside corroborates information past medical history as above  External Medical Documents Reviewed: Emergency department visit dated 08/10/2023 for similar chest pain and at that time noted shortness of breath as well and serial troponins and D-dimer as well as chest x-ray are all negative and was ultimately discharged.      Physical Exam   Triage Vital Signs: ED Triage Vitals  Encounter Vitals Group     BP 08/17/23 2134 136/87     Systolic BP Percentile --      Diastolic BP Percentile --      Pulse Rate 08/17/23 2134 84     Resp 08/17/23 2134 18     Temp 08/17/23 2134 98.5 F (36.9 C)     Temp Source 08/17/23 2134 Oral     SpO2 08/17/23 2134 96 %     Weight 08/17/23 2132 280 lb (127 kg)     Height 08/17/23 2132 6' (1.829 m)     Head Circumference --      Peak Flow --      Pain Score 08/17/23 2132 8     Pain Loc --      Pain Education --      Exclude from Growth Chart --     Most recent vital signs: Vitals:   08/17/23 2134  BP: 136/87  Pulse: 84  Resp: 18  Temp: 98.5 F (36.9 C)  SpO2: 96%    General: Awake, no distress.  CV:  Good peripheral perfusion.  Resp:  Normal effort.  Abd:  No distention.  Other:  Wake alert comfortable appearing with normal hemodynamics no fever, no hypoxemia breathing comfortably.  Clear lungs to  auscultation without wheezing or focality.  Soft benign abdominal exam to deep palpation all quadrants.  Appears euvolemic nontoxic.   ED Results / Procedures / Treatments   Labs (all labs ordered are listed, but only abnormal results are displayed) Labs Reviewed  COMPREHENSIVE METABOLIC PANEL - Abnormal; Notable for the following components:      Result Value   Glucose, Bld 113 (*)    Calcium 8.8 (*)    All other components within normal limits  URINALYSIS, W/ REFLEX TO CULTURE (INFECTION SUSPECTED) - Abnormal; Notable for the following components:   Color, Urine YELLOW (*)    APPearance CLEAR (*)    All other components within normal limits  RESP PANEL BY RT-PCR (RSV, FLU A&B, COVID)  RVPGX2  CBC  LIPASE, BLOOD  TROPONIN I (HIGH SENSITIVITY)  TROPONIN I (HIGH SENSITIVITY)     I ordered and reviewed the above labs they are notable for urinalysis without bacteria or inflammatory changes, cell counts serial troponins normal.  EKG  ED ECG REPORT I, Pilar Jarvis, the attending physician, personally viewed and interpreted this ECG.   Date: 08/18/2023  EKG Time:  2132  Rate: 77  Rhythm: sinus  Axis: nl  Intervals:none  ST&T Change: no stemi    RADIOLOGY I independently reviewed and interpreted chest x-ray and I see no obvious focality pneumothorax I also reviewed radiologist's formal read.   PROCEDURES:  Critical Care performed: No  Procedures   MEDICATIONS ORDERED IN ED: Medications - No data to display  IMPRESSION / MDM / ASSESSMENT AND PLAN / ED COURSE  I reviewed the triage vital signs and the nursing notes.                                Patient's presentation is most consistent with acute presentation with potential threat to life or bodily function.  Differential diagnosis includes, but is not limited to, ACS, dissection, PE, respiratory infection, pneumothorax   The patient is on the cardiac monitor to evaluate for evidence of arrhythmia and/or  significant heart rate changes.  MDM:    Intermittent chest pain with no shortness of breath ongoing for the last several weeks with Emergency Department visit last week for the same.  Fortunately EKG looks nonischemic and serial troponins are flat so I doubt ACS.  Chest x-ray without focality suggest bacterial pneumonia in the setting of no leukocytosis or fever I doubt bacterial pneumonia at this time.  Will check viral swabs.  No evidence of pneumothorax on chest x-ray as well.  I considered PE but I think unlikely given his symptomatology does not match and he had identical symptoms that was evaluated last week with negative D-dimer will defer on further testing for PE at this time given low clinical suspicion.  He has some intermittent dysuria but his urinalysis looks normal.  Given unremarkable workup as above patient will be discharged, PMD follow-up.   --   I spent 5 minutes counseling this patient on smoking cessation.  We spoke about the patient's current tobacco use, impact of smoking, assessed willingness to quit, methods for cessation including medical management and nicotine replacement therapy (which I prescribed to the patient) and advised follow-up with primary doctor to continue to address smoking cessation.           FINAL CLINICAL IMPRESSION(S) / ED DIAGNOSES   Final diagnoses:  Nonspecific chest pain  Encounter for smoking cessation counseling     Rx / DC Orders   ED Discharge Orders          Ordered    nicotine polacrilex (NICOTINE MINI) 4 MG lozenge  As needed        08/18/23 0027    nicotine (NICODERM CQ - DOSED IN MG/24 HR) 7 mg/24hr patch  Daily        08/18/23 0027             Note:  This document was prepared using Dragon voice recognition software and may include unintentional dictation errors.    Pilar Jarvis, MD 08/18/23 Georgiann Mohs    Pilar Jarvis, MD 08/18/23 [phone]

## 2023-08-24 ENCOUNTER — Ambulatory Visit: Payer: 59 | Admitting: Internal Medicine

## 2023-09-04 ENCOUNTER — Telehealth: Payer: 59 | Admitting: Physician Assistant

## 2023-09-04 DIAGNOSIS — R300 Dysuria: Secondary | ICD-10-CM

## 2023-09-04 DIAGNOSIS — R0989 Other specified symptoms and signs involving the circulatory and respiratory systems: Secondary | ICD-10-CM

## 2023-09-04 DIAGNOSIS — R1111 Vomiting without nausea: Secondary | ICD-10-CM

## 2023-09-04 DIAGNOSIS — M5442 Lumbago with sciatica, left side: Secondary | ICD-10-CM

## 2023-09-04 NOTE — Patient Instructions (Signed)
  Lymon T Georgiou, thank you for joining [name], PA-C for today's virtual visit.  While this provider is not your primary care provider (PCP), if your PCP is located in our provider database this encounter information will be shared with them immediately following your visit.   A [facility] MyChart account gives you access to today's visit and all your visits, tests, and labs performed at [facility]  click here if you don't have a [facility] MyChart account or go to [email or web address]  Consent: (Patient) Taivon T League provided verbal consent for this virtual visit at the beginning of the encounter.  Current Medications:  Current Outpatient Medications:    amoxicillin -clavulanate (AUGMENTIN ) 400-57 MG/5ML suspension, Take 10 mL PO BID x 10 days, Disp: 200 mL, Rfl: 0   cetirizine  HCl (ZYRTEC ) 5 MG/5ML SOLN, Take 10 mLs (10 mg total) by mouth daily for 14 days., Disp: 140 mL, Rfl: 0   fluticasone  (FLONASE ) 50 MCG/ACT nasal spray, Place 2 sprays into both nostrils daily., Disp: 16 g, Rfl: 6   nicotine  (NICODERM CQ  - DOSED IN MG/24 HR) 7 mg/24hr patch, Place 1 patch (7 mg total) onto the skin daily., Disp: 360 patch, Rfl: 0   nicotine  polacrilex (NICOTINE  MINI) 4 MG lozenge, Take 1 lozenge (4 mg total) by mouth as needed., Disp: 100 tablet, Rfl: 0   ondansetron  (ZOFRAN -ODT) 8 MG disintegrating tablet, Take 1 tablet (8 mg total) by mouth every 8 (eight) hours as needed for nausea or vomiting., Disp: 20 tablet, Rfl: 0   Medications ordered in this encounter:  No orders of the defined types were placed in this encounter.    *If you need refills on other medications prior to your next appointment, please contact your pharmacy*  Follow-Up: Call back or seek an in-person evaluation if the symptoms worsen or if the condition fails to improve as anticipated.  [facility] [phone]  Other Instructions   If you have been instructed to have an  in-person evaluation today at a local Urgent Care facility, please use the link below. It will take you to a list of all of our available [facility], including address, phone number and hours of operation. Please do not delay care.  [facility]  If you or a family member do not have a primary care provider, use the link below to schedule a visit and establish care. When you choose a [facility] primary care physician or advanced practice provider, you gain a long-term partner in health. Find a Primary Care Provider  Learn more about 's in-office and virtual care options:  - Get Care Now

## 2023-09-04 NOTE — Progress Notes (Signed)
 Virtual Visit Consent   Timothy [name], you are scheduled for a virtual visit with a [facility] provider today. Just as with appointments in the office, your consent must be obtained to participate. Your consent will be active for this visit and any virtual visit you may have with one of our providers in the next 365 days. If you have a MyChart account, a copy of this consent can be sent to you electronically.  As this is a virtual visit, video technology does not allow for your provider to perform a traditional examination. This may limit your provider's ability to fully assess your condition. If your provider identifies any concerns that need to be evaluated in person or the need to arrange testing (such as labs, EKG, etc.), we will make arrangements to do so. Although advances in technology are sophisticated, we cannot ensure that it will always work on either your end or our end. If the connection with a video visit is poor, the visit may have to be switched to a telephone visit. With either a video or telephone visit, we are not always able to ensure that we have a secure connection.  By engaging in this virtual visit, you consent to the provision of healthcare and authorize for your insurance to be billed (if applicable) for the services provided during this visit. Depending on your insurance coverage, you may receive a charge related to this service.  I need to obtain your verbal consent now. Are you willing to proceed with your visit today? Timothy [name] has provided verbal consent on 09/04/2023 for a virtual visit (video or telephone). Timothy [name]  Date: 09/04/2023 11:06 AM  Virtual Visit via Video Note   I, Timothy [name], connected with  Timothy [name]  ([id number], 04-03-1996) on 09/04/23 at 10:00 AM EST by a video-enabled telemedicine application and verified that I am speaking with the correct person using two identifiers.  Location: Patient: Virtual Visit Location  Patient: Home Provider: Virtual Visit Location Provider: Home Office   I discussed the limitations of evaluation and management by telemedicine and the availability of in person appointments. The patient expressed understanding and agreed to proceed.    History of Present Illness: Timothy [name] is a 28 y.o. who identifies as a male who was assigned male at birth, and is being seen today for back pain, vomiting, dysuria.  HPI: HPI  Reports last night he was resting and laying side with his head propped on his arm and had a sharp pain shoot across his whole lower back. He shot up quickly and that caused the pain to worsen and shoot down his left leg to the thigh. He is still having back pain today, but it is not shooting down his leg as bad. Now having pain and feels it radiates to the groin.   He then went to work this morning and went to the bathroom. He vomited once. He returned to the meeting he was in and then vomited on the table and was told to leave.   He mentions he has had a couple days of burning with urination prior to this onset. Unsure if related. Denies hematuria or penile discharge. Reports not sexually active since 2023.   He does also mention having a headache, fatigue, and some nasal congestion. Denies fevers or chills.  Problems:  Patient Active Problem List   Diagnosis Date Noted   Hypertriglyceridemia 06/23/2022   Insomnia 06/23/2022   Gastroesophageal reflux disease  without esophagitis 01/25/2022   Class 2 obesity due to excess calories with body mass index (BMI) of 35.0 to 35.9 in adult 03/29/2021   Anxiety and depression 07/28/2017    Allergies: No Known Allergies Medications:  Current Outpatient Medications:    amoxicillin -clavulanate (AUGMENTIN ) 400-57 MG/5ML suspension, Take 10 mL PO BID x 10 days, Disp: 200 mL, Rfl: 0   cetirizine  HCl (ZYRTEC ) 5 MG/5ML SOLN, Take 10 mLs (10 mg total) by mouth daily for 14 days., Disp: 140 mL, Rfl: 0   fluticasone  (FLONASE )  50 MCG/ACT nasal spray, Place 2 sprays into both nostrils daily., Disp: 16 g, Rfl: 6   nicotine  (NICODERM CQ  - DOSED IN MG/24 HR) 7 mg/24hr patch, Place 1 patch (7 mg total) onto the skin daily., Disp: 360 patch, Rfl: 0   nicotine  polacrilex (NICOTINE  MINI) 4 MG lozenge, Take 1 lozenge (4 mg total) by mouth as needed., Disp: 100 tablet, Rfl: 0   ondansetron  (ZOFRAN -ODT) 8 MG disintegrating tablet, Take 1 tablet (8 mg total) by mouth every 8 (eight) hours as needed for nausea or vomiting., Disp: 20 tablet, Rfl: 0  Observations/Objective: Patient is well-developed, well-nourished in no acute distress.  Resting comfortably at home.  Head is normocephalic, atraumatic.  No labored breathing.  Speech is clear and coherent with logical content.  Patient is alert and oriented at baseline.    Assessment and Plan: 1. Acute bilateral low back pain with left-sided sciatica (Primary)  2. Vomiting without nausea, unspecified vomiting type  3. Dysuria  4. Symptoms of upper respiratory infection (URI)  - Advised in person exam  - DDx: UTI, kidney stone, influenza, low back pain with sciatica, viral URI  Follow Up Instructions: I discussed the assessment and treatment plan with the patient. The patient was provided an opportunity to ask questions and all were answered. The patient agreed with the plan and demonstrated an understanding of the instructions.  A copy of instructions were sent to the patient via MyChart unless otherwise noted below.    The patient was advised to call back or seek an in-person evaluation if the symptoms worsen or if the condition fails to improve as anticipated.    Timothy [name]

## 2023-09-08 ENCOUNTER — Encounter: Payer: Self-pay | Admitting: Internal Medicine

## 2023-09-08 ENCOUNTER — Ambulatory Visit (INDEPENDENT_AMBULATORY_CARE_PROVIDER_SITE_OTHER): Payer: 59 | Admitting: Internal Medicine

## 2023-09-08 VITALS — BP 128/84 | Ht 72.0 in | Wt 271.4 lb

## 2023-09-08 DIAGNOSIS — K219 Gastro-esophageal reflux disease without esophagitis: Secondary | ICD-10-CM

## 2023-09-08 DIAGNOSIS — M545 Low back pain, unspecified: Secondary | ICD-10-CM

## 2023-09-08 DIAGNOSIS — M542 Cervicalgia: Secondary | ICD-10-CM | POA: Diagnosis not present

## 2023-09-08 DIAGNOSIS — F32A Depression, unspecified: Secondary | ICD-10-CM

## 2023-09-08 DIAGNOSIS — F419 Anxiety disorder, unspecified: Secondary | ICD-10-CM | POA: Diagnosis not present

## 2023-09-08 DIAGNOSIS — R0789 Other chest pain: Secondary | ICD-10-CM

## 2023-09-08 MED ORDER — ESCITALOPRAM OXALATE 5 MG/5ML PO SOLN: 5.0000 mg | Freq: Every day | ORAL | 1 refills | Status: DC

## 2023-09-08 NOTE — Progress Notes (Signed)
 Subjective:    Patient ID: Aria T Guillot, male    DOB: [birth year], 28 y.o.   MRN: [id number]  HPI  Discussed the use of AI scribe software for clinical note transcription with the patient, who gave verbal consent to proceed.   The patient, with a history of anxiety, presents with a complex constellation of symptoms that have been ongoing for the past week. He describes intermittent chest discomfort, likened to being shot by an electric pin, which is diffuse and not localized to any specific area. This discomfort is associated with shortness of breath, sweating, fatigue, and dizziness. The patient also reports increased sleep duration and episodes of vomiting, which have since resolved.  In addition to these symptoms, the patient has been experiencing headaches localized to the back right side of his head, nausea, and epigastric discomfort. He also reports lower back pain that started a few days ago, which is exacerbated by certain movements.  The patient has been under significant stress due to financial difficulties and a recent job change, which he believes may have triggered an anxiety attack. He also reports frequent heartburn and acid reflux, which he suspects may be contributing to his chest discomfort.  The patient has been managing his symptoms with nasal spray for mild congestion but has not taken any other medications. He has a history of difficulty swallowing pills and has been considering trying CBD for symptom management. He has previously tried Lexapro  for his anxiety but discontinued it due to financial constraints. He is open to restarting this medication at a low dose and gradually increasing as needed. He also expresses interest in seeing a therapist to help manage his stress and anxiety.        Review of Systems   Past Medical History:  Diagnosis Date   Allergy    Anxiety    Depression    Diverticulitis    Dyspnea    Fainting spell    in middle school   Family  history of bone cancer    Family history of breast cancer    Hyperlipidemia     Current Outpatient Medications  Medication Sig Dispense Refill   amoxicillin -clavulanate (AUGMENTIN ) 400-57 MG/5ML suspension Take 10 mL PO BID x 10 days 200 mL 0   cetirizine  HCl (ZYRTEC ) 5 MG/5ML SOLN Take 10 mLs (10 mg total) by mouth daily for 14 days. 140 mL 0   fluticasone  (FLONASE ) 50 MCG/ACT nasal spray Place 2 sprays into both nostrils daily. 16 g 6   nicotine  (NICODERM CQ  - DOSED IN MG/24 HR) 7 mg/24hr patch Place 1 patch (7 mg total) onto the skin daily. 360 patch 0   nicotine  polacrilex (NICOTINE  MINI) 4 MG lozenge Take 1 lozenge (4 mg total) by mouth as needed. 100 tablet 0   ondansetron  (ZOFRAN -ODT) 8 MG disintegrating tablet Take 1 tablet (8 mg total) by mouth every 8 (eight) hours as needed for nausea or vomiting. 20 tablet 0   No current facility-administered medications for this visit.    No Known Allergies  Family History  Problem Relation Age of Onset   Alcohol abuse Mother    Depression Mother    Cervical cancer Mother    Depression Father    Anxiety disorder Father    Healthy Sister    Alport syndrome Maternal Aunt    Heart disease Maternal Grandmother    Diabetes Maternal Grandmother    Cancer Maternal Grandmother        breast cancer   Alport  syndrome Maternal Grandmother    Cancer Paternal Grandfather        Bone cancer    Social History   Socioeconomic History   Marital status: Single    Spouse name: Not on file   Number of children: Not on file   Years of education: Not on file   Highest education level: High school graduate  Occupational History   Not on file  Tobacco Use   Smoking status: Former    Current packs/day: 0.25    Types: Cigarettes   Smokeless tobacco: Former    Types: Snuff   Tobacco comments:    pt smoke pack a week  Vaping Use   Vaping status: Every Day   Last attempt to quit: 09/09/2019   Substances: Nicotine , Flavoring  Substance and  Sexual Activity   Alcohol use: Not Currently    Alcohol/week: 4.0 standard drinks of alcohol    Types: 4 Cans of beer per week    Comment: occassionally   Drug use: No   Sexual activity: Not Currently  Other Topics Concern   Not on file  Social History Narrative   Not on file   Social Drivers of Health   Financial Resource Strain: Not on file  Food Insecurity: Not on file  Transportation Needs: Not on file  Physical Activity: Not on file  Stress: Not on file  Social Connections: Not on file  Intimate Partner Violence: Not on file     Constitutional: Pt reports fatigue, headache. Denies fever, malaise, headache or abrupt weight changes.  HEENT: Denies eye pain, eye redness, ear pain, ringing in the ears, wax buildup, runny nose, nasal congestion, bloody nose, or sore throat. Respiratory: Denies difficulty breathing, shortness of breath, cough or sputum production.   Cardiovascular: Pt reports chest pain. Denies chest pain, chest tightness, palpitations or swelling in the hands or feet.  Gastrointestinal: Pt reports nausea and vomiting which has resolved. Denies abdominal pain, bloating, constipation, diarrhea or blood in the stool.  GU: Denies urgency, frequency, pain with urination, burning sensation, blood in urine, odor or discharge. Musculoskeletal: Pt reports right side neck pain, low back pain. Denies decrease in range of motion, difficulty with gait, or joint pain and swelling.  Skin: Denies redness, rashes, lesions or ulcercations.  Neurological: Pt reports dizziness. Denies difficulty with memory, difficulty with speech or problems with balance and coordination.  Psych: Pt has a history anxiety. Denies depression, SI/HI.  No other specific complaints in a complete review of systems (except as listed in HPI above).      Objective:   Physical Exam  BP 128/84 (BP Location: Left Arm, Patient Position: Sitting, Cuff Size: Large)   Ht 6' (1.829 m)   Wt 271 lb 6.4 oz  (123.1 kg)   BMI 36.81 kg/m   Wt Readings from Last 3 Encounters:  08/17/23 280 lb (127 kg)  08/09/23 280 lb (127 kg)  05/24/23 260 lb (117.9 kg)    General: Appears his stated age, obese, in NAD. Skin: Warm, dry and intact.  HEENT: Head: normal shape and size; Eyes: sclera white, no icterus, conjunctiva pink, PERRLA and EOMs intact;  Cardiovascular: Normal rate and rhythm. S1,S2 noted.  No murmur, rubs or gallops noted.  Pulmonary/Chest: Normal effort and positive vesicular breath sounds. No respiratory distress. No wheezes, rales or ronchi noted.  Abdomen: Soft and tender in the epigastric region. Normal bowel sounds. No distention or masses noted.  Musculoskeletal: Normal flexion of the cervical spine.  Decreased extension  and rotation of the cervical spine secondary to pain.  No bony tenderness noted over the cervical spine.  Pain with palpation of the right paracervical muscles.  Shoulder shrug equal.  Normal flexion, extension and rotation of the lumbar spine.  No bony tenderness noted over the lumbar spine.  Pain with palpation over the right paralumbar muscles.  No difficulty with gait. Neurological: Alert and oriented.  Coordination normal.  Psychiatric: Mood and affect normal.  Mildly anxious appearing. Judgment and thought content normal.    BMET    Component Value Date/Time   NA 138 08/17/2023 2137   NA 138 12/16/2014 0839   K 3.5 08/17/2023 2137   K 3.9 12/16/2014 0839   CL 105 08/17/2023 2137   CL 106 12/16/2014 0839   CO2 22 08/17/2023 2137   CO2 26 12/16/2014 0839   GLUCOSE 113 (H) 08/17/2023 2137   GLUCOSE 110 (H) 12/16/2014 0839   BUN 13 08/17/2023 2137   BUN 11 12/16/2014 0839   CREATININE 0.76 08/17/2023 2137   CREATININE 0.97 07/29/2022 0905   CALCIUM  8.8 (L) 08/17/2023 2137   CALCIUM  8.8 (L) 12/16/2014 0839   GFRNONAA >60 08/17/2023 2137   GFRNONAA >60 12/16/2014 0839   GFRAA >60 12/09/2019 0555   GFRAA >60 12/16/2014 0839    Lipid Panel      Component Value Date/Time   CHOL 160 07/29/2022 0905   TRIG 166 (H) 07/29/2022 0905   HDL 35 (L) 07/29/2022 0905   CHOLHDL 4.6 07/29/2022 0905   LDLCALC 99 07/29/2022 0905    CBC    Component Value Date/Time   WBC 9.5 08/17/2023 2137   RBC 4.92 08/17/2023 2137   HGB 14.7 08/17/2023 2137   HGB 14.6 12/16/2014 0839   HCT 43.5 08/17/2023 2137   HCT 44.0 12/16/2014 0839   PLT 221 08/17/2023 2137   PLT 233 12/16/2014 0839   MCV 88.4 08/17/2023 2137   MCV 88 12/16/2014 0839   MCH 29.9 08/17/2023 2137   MCHC 33.8 08/17/2023 2137   RDW 12.8 08/17/2023 2137   RDW 13.8 12/16/2014 0839   LYMPHSABS 3.3 05/12/2023 1607   LYMPHSABS 2.9 12/16/2014 0839   MONOABS 1.2 (H) 05/12/2023 1607   MONOABS 1.2 (H) 12/16/2014 0839   EOSABS 0.1 05/12/2023 1607   EOSABS 0.3 12/16/2014 0839   BASOSABS 0.0 05/12/2023 1607   BASOSABS 0.1 12/16/2014 0839    Hgb A1C Lab Results  Component Value Date   HGBA1C 5.6 07/29/2022            Assessment & Plan:  Assessment and Plan    Chest Pain, GERD Episodic, sharp, and diffuse chest discomfort associated with nausea, vomiting, and dizziness. Recent hospital evaluation with normal labs, scans, and EKGs. Epigastric tenderness on exam suggestive of gastroesophageal reflux disease (GERD). -He has been unable to afford liquid pantoprazole  in the past -Anxiety likely contributing factor  Musculoskeletal Neck/Back Pain Neck and lower back pain with certain movements. No tenderness on exam. Likely musculoskeletal in nature. -Advise over-the-counter ibuprofen  for pain and inflammation. -Recommend heat, massage, and stretching.  Anxiety Recent major anxiety attack, ongoing stress, and sleep disturbances. Previous trial of Lexapro  with discontinuation due to financial constraints. -Start Lexapro  5mg  daily. -Schedule follow-up in 1 month to assess response and adjust dose as needed. -Referral to a therapist for additional support.   Schedule an  appointment for your annual exam Angeline Laura, NP

## 2023-09-08 NOTE — Telephone Encounter (Signed)
 Okay for work note?

## 2023-09-08 NOTE — Patient Instructions (Signed)
Managing Stress, Adult Feeling a certain amount of stress is normal. Stress helps our body and mind get ready to deal with the demands of life. Stress hormones can motivate you to do well at work and meet your responsibilities. But severe or long-term (chronic) stress can affect your mental and physical health. Chronic stress puts you at higher risk for: Anxiety and depression. Other health problems such as digestive problems, muscle aches, heart disease, high blood pressure, and stroke. What are the causes? Common causes of stress include: Demands from work, such as deadlines, feeling overworked, or having long hours. Pressures at home, such as money issues, disagreements with a spouse, or parenting issues. Pressures from major life changes, such as divorce, moving, loss of a loved one, or chronic illness. You may be at higher risk for stress-related problems if you: Do not get enough sleep. Are in poor health. Do not have emotional support. Have a mental health disorder such as anxiety or depression. How to recognize stress Stress can make you: Have trouble sleeping. Feel sad, anxious, irritable, or overwhelmed. Lose your appetite. Overeat or want to eat unhealthy foods. Want to use drugs or alcohol. Stress can also cause physical symptoms, such as: Sore, tense muscles, especially in the shoulders and neck. Headaches. Trouble breathing. A faster heart rate. Stomach pain, nausea, or vomiting. Diarrhea or constipation. Trouble concentrating. Follow these instructions at home: Eating and drinking Eat a healthy diet. This includes: Eating foods that are high in fiber, such as beans, whole grains, and fresh fruits and vegetables. Limiting foods that are high in fat and processed sugars, such as fried or sweet foods. Do not skip meals or overeat. Drink enough fluid to keep your urine pale yellow. Alcohol use Do not drink alcohol if: Your health care provider tells you not to  drink. You are pregnant, may be pregnant, or are planning to become pregnant. Drinking alcohol is a way some people try to ease their stress. This can be dangerous, so if you drink alcohol: Limit how much you have to: 0-1 drink a day for women. 0-2 drinks a day for men. Know how much alcohol is in your drink. In the [place] one drink equals one 12 oz bottle of beer (355 mL), one 5 oz glass of wine (148 mL), or one 1 oz glass of hard liquor (44 mL). Activity  Include 30 minutes of exercise in your daily schedule. Exercise is a good stress reducer. Include time in your day for an activity that you find relaxing. Try taking a walk, going on a bike ride, reading a book, or listening to music. Schedule your time in a way that lowers stress, and keep a regular schedule. Focus on doing what is most important to get done. Lifestyle Identify the source of your stress and your reaction to it. See a therapist who can help you change unhelpful reactions. When there are stressful events: Talk about them with family, friends, or coworkers. Try to think realistically about stressful events and not ignore them or overreact. Try to find the positives in a stressful situation and not focus on the negatives. Cut back on responsibilities at work and home, if possible. Ask for help from friends or family members if you need it. Find ways to manage stress, such as: Mindfulness, meditation, or deep breathing. Yoga or tai chi. Progressive muscle relaxation. Spending time in nature. Doing art, playing music, or reading. Making time for fun activities. Spending time with family and friends. Get support   from family, friends, or spiritual resources. General instructions Get enough sleep. Try to go to sleep and get up at about the same time every day. Take over-the-counter and prescription medicines only as told by your health care provider. Do not use any products that contain nicotine or tobacco. These products  include cigarettes, chewing tobacco, and vaping devices, such as e-cigarettes. If you need help quitting, ask your health care provider. Do not use drugs or smoke to deal with stress. Keep all follow-up visits. This is important. Where to find support Talk with your health care provider about stress management or finding a support group. Find a therapist to work with you on your stress management techniques. Where to find more information National Alliance on Mental Illness: www.nami.org American Psychological Association: www.apa.org Contact a health care provider if: Your stress symptoms get worse. You are unable to manage your stress at home. You are struggling to stop using drugs or alcohol. Get help right away if: You may be a danger to yourself or others. You have any thoughts of death or suicide. Get help right awayif you feel like you may hurt yourself or others, or have thoughts about taking your own life. Go to your nearest emergency room or: Call 911. Call the National Suicide Prevention Lifeline at 1-800-273-8255 or 988 in the [place] This is open 24 hours a day. Text the Crisis Text Line at 741741. Summary Feeling a certain amount of stress is normal, but severe or long-term (chronic) stress can affect your mental and physical health. Chronic stress can put you at higher risk for anxiety, depression, and other health problems such as digestive problems, muscle aches, heart disease, high blood pressure, and stroke. You may be at higher risk for stress-related problems if you do not get enough sleep, are in poor health, lack emotional support, or have a mental health disorder such as anxiety or depression. Identify the source of your stress and your reaction to it. Try talking about stressful events with family, friends, or coworkers, finding a coping method, or getting support from spiritual resources. If you need more help, talk with your health care provider about finding a  support group or a mental health therapist. This information is not intended to replace advice given to you by your health care provider. Make sure you discuss any questions you have with your health care provider. Document Revised: 03/11/2021 Document Reviewed: 03/09/2021 Elsevier Patient Education  2024 [employer].  

## 2023-09-12 ENCOUNTER — Emergency Department: Payer: 59

## 2023-09-12 ENCOUNTER — Encounter: Payer: Self-pay | Admitting: Internal Medicine

## 2023-09-12 ENCOUNTER — Other Ambulatory Visit: Payer: Self-pay

## 2023-09-12 ENCOUNTER — Telehealth: Payer: Self-pay | Admitting: Internal Medicine

## 2023-09-12 ENCOUNTER — Emergency Department
Admission: EM | Admit: 2023-09-12 | Discharge: 2023-09-12 | Disposition: A | Discharge status: Home / Self Care | Payer: 59 | Attending: Emergency Medicine | Admitting: Emergency Medicine

## 2023-09-12 ENCOUNTER — Ambulatory Visit: Payer: Self-pay | Admitting: *Deleted

## 2023-09-12 DIAGNOSIS — Z20822 Contact with and (suspected) exposure to covid-19: Secondary | ICD-10-CM | POA: Insufficient documentation

## 2023-09-12 DIAGNOSIS — K2900 Acute gastritis without bleeding: Secondary | ICD-10-CM | POA: Diagnosis not present

## 2023-09-12 DIAGNOSIS — J069 Acute upper respiratory infection, unspecified: Secondary | ICD-10-CM | POA: Diagnosis not present

## 2023-09-12 DIAGNOSIS — R457 State of emotional shock and stress, unspecified: Secondary | ICD-10-CM | POA: Diagnosis not present

## 2023-09-12 DIAGNOSIS — R059 Cough, unspecified: Secondary | ICD-10-CM | POA: Diagnosis not present

## 2023-09-12 DIAGNOSIS — R079 Chest pain, unspecified: Secondary | ICD-10-CM | POA: Diagnosis not present

## 2023-09-12 DIAGNOSIS — R0789 Other chest pain: Secondary | ICD-10-CM | POA: Diagnosis not present

## 2023-09-12 DIAGNOSIS — R110 Nausea: Secondary | ICD-10-CM | POA: Diagnosis not present

## 2023-09-12 DIAGNOSIS — R000 Tachycardia, unspecified: Secondary | ICD-10-CM | POA: Diagnosis not present

## 2023-09-12 DIAGNOSIS — R1011 Right upper quadrant pain: Secondary | ICD-10-CM | POA: Diagnosis not present

## 2023-09-12 LAB — RESP PANEL BY RT-PCR (RSV, FLU A&B, COVID)  RVPGX2
Influenza A by PCR: NEGATIVE
Influenza B by PCR: NEGATIVE
Resp Syncytial Virus by PCR: NEGATIVE
SARS Coronavirus 2 by RT PCR: NEGATIVE

## 2023-09-12 LAB — COMPREHENSIVE METABOLIC PANEL
ALT: 50 U/L — ABNORMAL HIGH (ref 0–44)
AST: 34 U/L (ref 15–41)
Albumin: 4.1 g/dL (ref 3.5–5.0)
Alkaline Phosphatase: 66 U/L (ref 38–126)
Anion gap: 9 (ref 5–15)
BUN: 11 mg/dL (ref 6–20)
CO2: 24 mmol/L (ref 22–32)
Calcium: 8.8 mg/dL — ABNORMAL LOW (ref 8.9–10.3)
Chloride: 104 mmol/L (ref 98–111)
Creatinine, Ser: 0.79 mg/dL (ref 0.61–1.24)
GFR, Estimated: >60 mL/min (ref >60)
Glucose, Bld: 114 mg/dL — ABNORMAL HIGH (ref 70–99)
Potassium: 3.6 mmol/L (ref 3.5–5.1)
Sodium: 137 mmol/L (ref 135–145)
Total Bilirubin: 0.5 mg/dL (ref 0.0–1.2)
Total Protein: 7.4 g/dL (ref 6.5–8.1)

## 2023-09-12 LAB — CBC
HCT: 43.8 % (ref 39.0–52.0)
Hemoglobin: 15 g/dL (ref 13.0–17.0)
MCH: 29.7 pg (ref 26.0–34.0)
MCHC: 34.2 g/dL (ref 30.0–36.0)
MCV: 86.7 fL (ref 80.0–100.0)
Platelets: 202 10*3/uL (ref 150–400)
RBC: 5.05 MIL/uL (ref 4.22–5.81)
RDW: 13.1 % (ref 11.5–15.5)
WBC: 7.7 10*3/uL (ref 4.0–10.5)
nRBC: 0 % (ref 0.0–0.2)

## 2023-09-12 LAB — TROPONIN I (HIGH SENSITIVITY): Troponin I (High Sensitivity): 5 ng/L (ref <18)

## 2023-09-12 LAB — LIPASE, BLOOD: Lipase: 29 U/L (ref 11–51)

## 2023-09-12 MED ORDER — ONDANSETRON 4 MG PO TBDP
4.0000 mg | ORAL_TABLET | Freq: Once | ORAL | Status: AC
  Administered 2023-09-12: 4 mg via ORAL
  Filled 2023-09-12: qty 1

## 2023-09-12 MED ORDER — SUCRALFATE 1 GM/10ML PO SUSP: 1.0000 g | Freq: Four times a day (QID) | ORAL | 0 refills | Status: DC

## 2023-09-12 MED ORDER — PANTOPRAZOLE SODIUM 20 MG PO TBEC: 20.0000 mg | DELAYED_RELEASE_TABLET | Freq: Every day | ORAL | 0 refills | Status: DC

## 2023-09-12 MED ORDER — BENZONATATE 200 MG PO CAPS
200.0000 mg | ORAL_CAPSULE | Freq: Three times a day (TID) | ORAL | 0 refills | Status: DC | PRN
Start: 2023-09-12 — End: 2024-09-11

## 2023-09-12 NOTE — ED Triage Notes (Signed)
 Pt from home via [facility] with reports of cold sx's x 1 week with cough that wont improve.

## 2023-09-12 NOTE — Telephone Encounter (Signed)
 This does not come in liquid form. Would recommend he use delsym or robitussin OTC

## 2023-09-12 NOTE — Telephone Encounter (Signed)
 ERROR

## 2023-09-12 NOTE — ED Provider Triage Note (Signed)
 Emergency Medicine Provider Triage Evaluation Note  Timothy [name] , a 28 y.o. male  was evaluated in triage.  Pt complains of cough, congestion for 1 week.  Left sided CP this am.  Felt like he had fever last pm.    Review of Systems  Positive: Vomting, cough, CP, fever.  Negative: No diarrhea  Physical Exam  BP 121/75   Pulse 89   Temp 98.2 F (36.8 C)   Resp 17   Ht 6' (1.829 m)   Wt 123 kg   SpO2 99%   BMI 36.78 kg/m  Gen:   Awake, no distress   Resp:  Normal effort   Lungs clear.  MSK:   Moves extremities without difficulty  Other:    Medical Decision Making  Medically screening exam initiated at 9:00 AM.  Appropriate orders placed.  Timothy [name] was informed that the remainder of the evaluation will be completed by another provider, this initial triage assessment does not replace that evaluation, and the importance of remaining in the ED until their evaluation is complete.     Saunders Shona CROME, PA-C 09/12/23 [phone]

## 2023-09-12 NOTE — Telephone Encounter (Signed)
  Chief Complaint: patient requesting PCP to write medication in liquid form or order cough syrup . Patient seen in ED yesterday and unable to take pill form medication for benzonatate  200 mg  Symptoms: dx gastritis abdominal pain, vomiting, low grade fever.  Frequency: yesterday  Pertinent Negatives: Patient denies na  Disposition: [] ED /[] Urgent Care (no appt availability in office) / [] Appointment(In office/virtual)/ []  [facility]/ [] Home Care/ [] Refused Recommended Disposition /[]  Mobile Bus/ [x]  Follow-up with PCP Additional Notes:   Patient reports he is requesting liquid form medication due to swallowing issues with pills. Patient reports he requested from ED for PA to write medication in liquid form and they said no. Please advise if my chart VV or OV needed to get patient new medication form in liquid. Reviewed with patient sulcralfate is already in suspension .      Reason for Disposition  Prescription request for new medicine (not a refill)  Answer Assessment - Initial Assessment Questions 1. NAME of MEDICINE: What medicine(s) are you calling about?     Benzonatate  and sucralfate  2. QUESTION: What is your question? (e.g., double dose of medicine, side effect)     Can PCP prescribe medication in liquid form due to not being able to swallow pills?  3. PRESCRIBER: Who prescribed the medicine? Reason: if prescribed by specialist, call should be referred to that group.      Shona Gin, PA from ED.   4. SYMPTOMS: Do you have any symptoms? If Yes, ask: What symptoms are you having?  How bad are the symptoms (e.g., mild, moderate, severe)     Dx gastritis, coughing vomiting pain in abdomen. 5. PREGNANCY:  Is there any chance that you are pregnant? When was your last menstrual period?     na  Protocols used: Medication Question Call-A-AH

## 2023-09-12 NOTE — Telephone Encounter (Signed)
 Spoke to patient explained benzonatate doesn't come in liquid. He can pick up delsym or robitussin OTC.  Verbal understanding

## 2023-09-12 NOTE — Discharge Instructions (Signed)
 Follow up with [name] for your symptoms.  Take medication as directed.   No alcohol.

## 2023-09-12 NOTE — ED Provider Notes (Signed)
 [facility] Provider Note    None    (approximate)   History   Cough   HPI  Timothy [name] is a 28 y.o. male   presents to the ED via EMS with multiple complaints.  Patient initially states that he was driving to work when he began having a sharp pain to the left side of his chest.  He reports that he has had cold symptoms for several days and has been coughing up sputum which increases his pain in his chest.  He denies any shortness of breath, diaphoresis, or prior heart issues.  He does endorse some emesis which occasionally is independent of coughing and other times is after coughing.  He also reports that he has not been to work all week as he has been feeling not completely well.  Patient has history of anxiety/depression, insomnia and anxiety.  He denies smoking but does admit to recently drinking alcohol.      Physical Exam   Triage Vital Signs: ED Triage Vitals  Encounter Vitals Group     BP 09/12/23 0900 121/75     Systolic BP Percentile --      Diastolic BP Percentile --      Pulse Rate 09/12/23 0900 89     Resp 09/12/23 0900 17     Temp 09/12/23 0900 98.2 F (36.8 C)     Temp src --      SpO2 09/12/23 0900 99 %     Weight 09/12/23 0856 271 lb 2.7 oz (123 kg)     Height 09/12/23 0856 6' (1.829 m)     Head Circumference --      Peak Flow --      Pain Score 09/12/23 0856 7     Pain Loc --      Pain Education --      Exclude from Growth Chart --     Most recent vital signs: Vitals:   09/12/23 0900  BP: 121/75  Pulse: 89  Resp: 17  Temp: 98.2 F (36.8 C)  SpO2: 99%     General: Awake, no distress.  Able to talk in complete sentences without any difficulty. CV:  Good peripheral perfusion.  Heart regular rate rhythm. Resp:  Normal effort.  Lungs clear bilaterally. Abd:  No distention.  Right upper quadrant tenderness without guarding or grimace.  Bowel sounds normoactive x 4 quadrants. Other:     ED Results / Procedures /  Treatments   Labs (all labs ordered are listed, but only abnormal results are displayed) Labs Reviewed  COMPREHENSIVE METABOLIC PANEL - Abnormal; Notable for the following components:      Result Value   Glucose, Bld 114 (*)    Calcium  8.8 (*)    ALT 50 (*)    All other components within normal limits  RESP PANEL BY RT-PCR (RSV, FLU A&B, COVID)  RVPGX2  CBC  LIPASE, BLOOD  TROPONIN I (HIGH SENSITIVITY)  TROPONIN I (HIGH SENSITIVITY)     EKG  Vent. rate 98 BPM PR interval 148 ms QRS duration 84 ms QT/QTcB 322/411 ms P-R-T axes 49 30 17 Normal sinus rhythm Nonspecific T wave abnormality Abnormal ECG When compared with ECG of 17-Aug-2023 21:32,   RADIOLOGY  Chest x-ray images were reviewed and interpreted by myself independent of the radiologist and was negative for acute cardiopulmonary changes.  Ultrasound right upper quadrant per radiologist is negative for cholelithiasis or gallbladder findings.   PROCEDURES:  Critical Care performed:  Procedures   MEDICATIONS ORDERED IN ED: Medications  ondansetron  (ZOFRAN -ODT) disintegrating tablet 4 mg (4 mg Oral Given 09/12/23 1147)     IMPRESSION / MDM / ASSESSMENT AND PLAN / ED COURSE  I reviewed the triage vital signs and the nursing notes.   Differential diagnosis includes, but is not limited to, chest pain, viral URI, pneumonia, bronchitis, gastritis, cholelithiasis, GERD, influenza, RSV, COVID.  28 year old male presents to the ED with complaint of chest pain exacerbated by coughing along with history of emesis intermittently related to coughing versus independent.  Patient reports that he has had some gastritis issues and is seeing Dr. Therisa in the past but currently not on any medication.  I explained to the patient that his troponin, CBC and CMP were reassuring.  Lipase was 29 and he is negative for COVID, influenza and RSV.  He also was reassured with chest x-ray and ultrasound of his abdomen being negative.  I  explained to him that we have ruled out acute reasons and also evaluated for cardiac reasons for his symptoms.  Patient is to follow-up with his PCP for outpatient workup and to follow-up with Dr. Therisa.  Patient was prescribed Tessalon  Perles which will not cause drowsiness while he is driving and also Protonix  until he can get an appointment with Dr. Therisa.  He was also instructed not to drink any alcohol while taking these medications.  After discharge patient called back stating that he could not take pills and Protonix  was changed to Carafate .  Patient was made aware that he could drink lots of fluids and return to work on Thursday.  His mother is present with him and agrees with plan.  She asked for a doctor's note since she is here with him and he became quite angry stating that one of them needed to work.      Patient's presentation is most consistent with acute illness / injury with system symptoms.  FINAL CLINICAL IMPRESSION(S) / ED DIAGNOSES   Final diagnoses:  Viral URI with cough  Other acute gastritis without hemorrhage     Rx / DC Orders   ED Discharge Orders          Ordered    benzonatate  (TESSALON ) 200 MG capsule  3 times daily PRN        09/12/23 1228    pantoprazole  (PROTONIX ) 20 MG tablet  Daily,   Status:  Discontinued        09/12/23 1228    sucralfate  (CARAFATE ) 1 GM/10ML suspension  4 times daily        09/12/23 1344             Note:  This document was prepared using Dragon voice recognition software and may include unintentional dictation errors.   Saunders Shona CROME, PA-C 09/12/23 1438    Ernest Ronal BRAVO, MD 09/12/23 [phone]

## 2023-09-12 NOTE — ED Triage Notes (Signed)
 Reports cold sx and sharp chest pain. Initially states coughing up sputum, after review of chart, pt endorses still having emesis when asked about office visit on 1/10.  NAD noted

## 2023-09-13 ENCOUNTER — Telehealth: Payer: 59 | Admitting: Physician Assistant

## 2023-09-13 DIAGNOSIS — J069 Acute upper respiratory infection, unspecified: Secondary | ICD-10-CM | POA: Diagnosis not present

## 2023-09-13 MED ORDER — PROMETHAZINE-DM 6.25-15 MG/5ML PO SYRP: 5.0000 mL | ORAL_SOLUTION | Freq: Four times a day (QID) | ORAL | 0 refills | Status: DC | PRN

## 2023-09-13 MED ORDER — PROMETHAZINE-DM 6.25-15 MG/5ML PO SYRP
5.0000 mL | ORAL_SOLUTION | Freq: Four times a day (QID) | ORAL | 0 refills | Status: DC | PRN
Start: 2023-09-13 — End: 2023-09-13

## 2023-09-13 NOTE — Progress Notes (Signed)
 Virtual Visit Consent   Timothy [name], you are scheduled for a virtual visit with a [facility] provider today. Just as with appointments in the office, your consent must be obtained to participate. Your consent will be active for this visit and any virtual visit you may have with one of our providers in the next 365 days. If you have a MyChart account, a copy of this consent can be sent to you electronically.  As this is a virtual visit, video technology does not allow for your provider to perform a traditional examination. This may limit your provider's ability to fully assess your condition. If your provider identifies any concerns that need to be evaluated in person or the need to arrange testing (such as labs, EKG, etc.), we will make arrangements to do so. Although advances in technology are sophisticated, we cannot ensure that it will always work on either your end or our end. If the connection with a video visit is poor, the visit may have to be switched to a telephone visit. With either a video or telephone visit, we are not always able to ensure that we have a secure connection.  By engaging in this virtual visit, you consent to the provision of healthcare and authorize for your insurance to be billed (if applicable) for the services provided during this visit. Depending on your insurance coverage, you may receive a charge related to this service.  I need to obtain your verbal consent now. Are you willing to proceed with your visit today? Timothy [name] has provided verbal consent on 09/13/2023 for a virtual visit (video or telephone). Hyla Maillard, [place]  Date: 09/13/2023 11:55 AM  Virtual Visit via Video Note   I, Hyla Maillard, connected with  Timothy [name]  ([id number], 1996-04-20) on 09/13/23 at 11:45 AM EST by a video-enabled telemedicine application and verified that I am speaking with the correct person using two identifiers.  Location: Patient: Virtual Visit Location  Patient: Home Provider: Virtual Visit Location Provider: Home Office   I discussed the limitations of evaluation and management by telemedicine and the availability of in person appointments. The patient expressed understanding and agreed to proceed.    History of Present Illness: Timothy [name] is a 28 y.o. who identifies as a male who was assigned male at birth, and is being seen today for continued URI symptoms over the past day. Notes continued cough, congestion, sore throat and upset stomach. Started running a fever last night of 101 to 10.25 by 4AM. Some associated sweats.  No residual chest pain he was noting yesterday at time of ER assessment.   HPI: HPI  Problems:  Patient Active Problem List   Diagnosis Date Noted   Hypertriglyceridemia 06/23/2022   Insomnia 06/23/2022   Gastroesophageal reflux disease without esophagitis 01/25/2022   Class 2 obesity due to excess calories with body mass index (BMI) of 35.0 to 35.9 in adult 03/29/2021   Anxiety and depression 07/28/2017    Allergies: No Known Allergies Medications:  Current Outpatient Medications:    promethazine -dextromethorphan (PROMETHAZINE -DM) 6.25-15 MG/5ML syrup, Take 5 mLs by mouth 4 (four) times daily as needed for cough., Disp: 118 mL, Rfl: 0   cetirizine  HCl (ZYRTEC ) 5 MG/5ML SOLN, Take 10 mLs (10 mg total) by mouth daily for 14 days., Disp: 140 mL, Rfl: 0   escitalopram  (LEXAPRO ) 5 MG/5ML solution, Take 5 mLs (5 mg total) by mouth daily., Disp: 450 mL, Rfl: 1   fluticasone  (FLONASE ) 50  MCG/ACT nasal spray, Place 2 sprays into both nostrils daily., Disp: 16 g, Rfl: 6   sucralfate  (CARAFATE ) 1 GM/10ML suspension, Take 10 mLs (1 g total) by mouth 4 (four) times daily., Disp: 420 mL, Rfl: 0  Observations/Objective: Patient is well-developed, well-nourished in no acute distress.  Resting comfortably at home.  Head is normocephalic, atraumatic.  No labored breathing. Speech is clear and coherent with logical content.   Patient is alert and oriented at baseline.   Assessment and Plan: 1. Viral URI with cough (Primary) - promethazine -dextromethorphan (PROMETHAZINE -DM) 6.25-15 MG/5ML syrup; Take 5 mLs by mouth 4 (four) times daily as needed for cough.  Dispense: 118 mL; Refill: 0  Negative for flu/COVID/RSV at ER visit yesterday. CXR unremarkable as well. Suspect Norovirus. Supportive measures and OTC medications reviewed. Start promethazine -DM for cough. BRAT diet reviewed. Strict in person evaluation precautions discussed.  Follow Up Instructions: I discussed the assessment and treatment plan with the patient. The patient was provided an opportunity to ask questions and all were answered. The patient agreed with the plan and demonstrated an understanding of the instructions.  A copy of instructions were sent to the patient via MyChart unless otherwise noted below.   The patient was advised to call back or seek an in-person evaluation if the symptoms worsen or if the condition fails to improve as anticipated.    Hyla Maillard, PA-C

## 2023-09-13 NOTE — Patient Instructions (Addendum)
 Timothy T Wickwire, thank you for joining Hyla Maillard, PA-C for today's virtual visit.  While this provider is not your primary care provider (PCP), if your PCP is located in our provider database this encounter information will be shared with them immediately following your visit.   A [facility] MyChart account gives you access to today's visit and all your visits, tests, and labs performed at [facility] " click here if you don't have a [facility] MyChart account or go to [email or web address]  Consent: (Patient) Timothy [name] provided verbal consent for this virtual visit at the beginning of the encounter.  Current Medications:  Current Outpatient Medications:    benzonatate  (TESSALON ) 200 MG capsule, Take 1 capsule (200 mg total) by mouth 3 (three) times daily as needed for cough., Disp: 30 capsule, Rfl: 0   cetirizine  HCl (ZYRTEC ) 5 MG/5ML SOLN, Take 10 mLs (10 mg total) by mouth daily for 14 days., Disp: 140 mL, Rfl: 0   escitalopram  (LEXAPRO ) 5 MG/5ML solution, Take 5 mLs (5 mg total) by mouth daily., Disp: 450 mL, Rfl: 1   fluticasone  (FLONASE ) 50 MCG/ACT nasal spray, Place 2 sprays into both nostrils daily., Disp: 16 g, Rfl: 6   sucralfate  (CARAFATE ) 1 GM/10ML suspension, Take 10 mLs (1 g total) by mouth 4 (four) times daily., Disp: 420 mL, Rfl: 0   Medications ordered in this encounter:  No orders of the defined types were placed in this encounter.    *If you need refills on other medications prior to your next appointment, please contact your pharmacy*  Follow-Up: Call back or seek an in-person evaluation if the symptoms worsen or if the condition fails to improve as anticipated.  [facility] [phone]  Other Instructions Please keep well-hydrated and get plenty of rest. Ok to continue your allergy medication regimen and the Tylenol . Start the promethazine -dm cough syrup as directed.  Follow the dietary recommendations listed  below. If symptoms are not turning the corner, or anything worsening, you need to be re-evaluated in person ASAP.  Food Choices to Help Relieve Diarrhea, Adult Diarrhea can make you feel weak and cause you to become dehydrated. Dehydration is a condition in which there is not enough water or other fluids in the body. It is important to choose the right foods and drinks to: Relieve diarrhea. Replace lost fluids and nutrients. Prevent dehydration. What are tips for following this plan? Relieving diarrhea Avoid foods that make your diarrhea worse. These may include: Foods and drinks that are sweetened with high-fructose corn syrup, honey, or sweeteners such as xylitol, sorbitol, and mannitol. Check food labels for these ingredients. Fried, greasy, or spicy foods. Raw fruits and vegetables. Eat foods that are rich in probiotics. These include foods such as yogurt and fermented milk products. Probiotics can help increase healthy bacteria in your stomach and intestines (gastrointestinal or GI tract). This may help digestion and stop diarrhea. If you have lactose intolerance, avoid dairy products. These may make your diarrhea worse. Take medicine to help stop diarrhea only as told by your health care provider. Replacing nutrients  Eat bland, easy-to-digest foods in small amounts as you are able, until your diarrhea starts to get better. These foods include bananas, applesauce, rice, toast, and crackers. Over time, add nutrient-rich foods as your body tolerates them or as told by your health care provider. These include: Well-cooked protein foods, such as eggs, lean meats like fish or chicken without skin, and tofu. Peeled, seeded, and soft-cooked  fruits and vegetables. Low-fat dairy products. Whole grains. Take vitamin and mineral supplements as told by your health care provider. Preventing dehydration  Start by sipping water or a solution to prevent dehydration (oral rehydration solution, or  ORS). This is a drink that helps replace fluids and minerals your body has lost. You can buy an ORS at pharmacies and retail stores. Try to drink at least 8-10 cups (2,000-2,500 mL) of fluid each day to help replace lost fluids. If your urine is pale yellow, you are getting enough fluids. You may drink other liquids in addition to water, such as fruit juice that you have added water to (diluted fruit juice) or low-calorie sports drinks, as tolerated or as told by your health care provider. Avoid drinks with caffeine, such as coffee, tea, or soft drinks. Avoid alcohol. This information is not intended to replace advice given to you by your health care provider. Make sure you discuss any questions you have with your health care provider. Document Revised: 02/01/2022 Document Reviewed: 02/01/2022 Elsevier Patient Education  2024 Elsevier Inc.    If you have been instructed to have an in-person evaluation today at a local Urgent Care facility, please use the link below. It will take you to a list of all of our available [facility], including address, phone number and hours of operation. Please do not delay care.  [facility]  If you or a family member do not have a primary care provider, use the link below to schedule a visit and establish care. When you choose a [facility] primary care physician or advanced practice provider, you gain a long-term partner in health. Find a Primary Care Provider  Learn more about [facility] in-office and virtual care options: [facility] - Get Care Now

## 2023-09-28 DIAGNOSIS — R1084 Generalized abdominal pain: Secondary | ICD-10-CM | POA: Diagnosis not present

## 2023-09-28 DIAGNOSIS — R519 Headache, unspecified: Secondary | ICD-10-CM | POA: Diagnosis not present

## 2023-09-28 DIAGNOSIS — M549 Dorsalgia, unspecified: Secondary | ICD-10-CM | POA: Diagnosis not present

## 2023-09-28 DIAGNOSIS — R109 Unspecified abdominal pain: Secondary | ICD-10-CM | POA: Diagnosis not present

## 2023-09-28 DIAGNOSIS — R079 Chest pain, unspecified: Secondary | ICD-10-CM | POA: Diagnosis not present

## 2023-09-28 DIAGNOSIS — R112 Nausea with vomiting, unspecified: Secondary | ICD-10-CM | POA: Diagnosis not present

## 2023-09-29 DIAGNOSIS — R112 Nausea with vomiting, unspecified: Secondary | ICD-10-CM | POA: Diagnosis not present

## 2023-09-29 DIAGNOSIS — R079 Chest pain, unspecified: Secondary | ICD-10-CM | POA: Diagnosis not present

## 2023-09-29 DIAGNOSIS — R109 Unspecified abdominal pain: Secondary | ICD-10-CM | POA: Diagnosis not present

## 2023-10-02 ENCOUNTER — Other Ambulatory Visit: Payer: Self-pay

## 2023-10-02 ENCOUNTER — Encounter: Payer: Self-pay | Admitting: Emergency Medicine

## 2023-10-02 ENCOUNTER — Emergency Department
Admission: EM | Admit: 2023-10-02 | Discharge: 2023-10-02 | Disposition: A | Discharge status: Home / Self Care | Payer: 59 | Attending: Emergency Medicine | Admitting: Emergency Medicine

## 2023-10-02 DIAGNOSIS — F419 Anxiety disorder, unspecified: Secondary | ICD-10-CM | POA: Diagnosis not present

## 2023-10-02 DIAGNOSIS — R0789 Other chest pain: Secondary | ICD-10-CM | POA: Diagnosis not present

## 2023-10-02 DIAGNOSIS — R079 Chest pain, unspecified: Secondary | ICD-10-CM | POA: Insufficient documentation

## 2023-10-02 LAB — COMPREHENSIVE METABOLIC PANEL
ALT: 43 U/L (ref 0–44)
AST: 26 U/L (ref 15–41)
Albumin: 4.1 g/dL (ref 3.5–5.0)
Alkaline Phosphatase: 63 U/L (ref 38–126)
Anion gap: 12 (ref 5–15)
BUN: 11 mg/dL (ref 6–20)
CO2: 20 mmol/L — ABNORMAL LOW (ref 22–32)
Calcium: 8.9 mg/dL (ref 8.9–10.3)
Chloride: 106 mmol/L (ref 98–111)
Creatinine, Ser: 0.75 mg/dL (ref 0.61–1.24)
GFR, Estimated: >60 mL/min (ref >60)
Glucose, Bld: 92 mg/dL (ref 70–99)
Potassium: 3.8 mmol/L (ref 3.5–5.1)
Sodium: 138 mmol/L (ref 135–145)
Total Bilirubin: 0.9 mg/dL (ref 0.0–1.2)
Total Protein: 7.6 g/dL (ref 6.5–8.1)

## 2023-10-02 LAB — CBC
HCT: 44.8 % (ref 39.0–52.0)
Hemoglobin: 15.5 g/dL (ref 13.0–17.0)
MCH: 29.8 pg (ref 26.0–34.0)
MCHC: 34.6 g/dL (ref 30.0–36.0)
MCV: 86.2 fL (ref 80.0–100.0)
Platelets: 240 10*3/uL (ref 150–400)
RBC: 5.2 MIL/uL (ref 4.22–5.81)
RDW: 12.6 % (ref 11.5–15.5)
WBC: 8.1 10*3/uL (ref 4.0–10.5)
nRBC: 0 % (ref 0.0–0.2)

## 2023-10-02 LAB — LIPASE, BLOOD: Lipase: 31 U/L (ref 11–51)

## 2023-10-02 LAB — TROPONIN I (HIGH SENSITIVITY): Troponin I (High Sensitivity): 4 ng/L (ref <18)

## 2023-10-02 MED ORDER — ALUM & MAG HYDROXIDE-SIMETH 200-200-20 MG/5ML PO SUSP
30.0000 mL | Freq: Once | ORAL | Status: AC
  Administered 2023-10-02: 30 mL via ORAL
  Filled 2023-10-02: qty 30

## 2023-10-02 MED ORDER — LIDOCAINE VISCOUS HCL 2 % MT SOLN
15.0000 mL | Freq: Once | OROMUCOSAL | Status: AC
  Administered 2023-10-02: 15 mL via OROMUCOSAL
  Filled 2023-10-02: qty 15

## 2023-10-02 MED ORDER — LORAZEPAM 2 MG/ML PO CONC
2.0000 mg | Freq: Once | ORAL | Status: AC
  Administered 2023-10-02: 2 mg via ORAL

## 2023-10-02 NOTE — ED Provider Notes (Signed)
-----------------------------------------   3:23 PM on 10/02/2023 -----------------------------------------  Blood pressure 132/84, pulse 92, temperature 98.3 F (36.8 C), temperature source Oral, resp. rate 18, height 6' (1.829 m), weight 122.9 kg, SpO2 95%.  Assuming care from Dr. Anner Crete.  In short, Timothy [name] is a 28 y.o. male with a chief complaint of Chest Pain .  Refer to the original H&P for additional details.  The current plan of care is to follow-up troponin results and reassess.  ----------------------------------------- 3:38 PM on 10/02/2023 ----------------------------------------- Troponin within normal limits, patient reports pain has resolved following dose of Ativan and GI cocktail.  He is appropriate for discharge home with outpatient follow-up, was counseled to return to the ED for new or worsening symptoms.  Patient agrees with plan.         Chesley Noon, MD 10/02/23 [phone]

## 2023-10-02 NOTE — ED Triage Notes (Signed)
C/O chest pain, body aches, abdominal pain symptoms have been ongoing x 1 month.  Seen through [facility] 2 days ago.   States food will make patient vomit.

## 2023-10-02 NOTE — ED Notes (Signed)
See triage note  Presents with intermittent chest pain  Describes "sharp spurts of pain"  This started about 2-3 weeks ago No n/v    No fever or cough

## 2023-10-02 NOTE — ED Provider Triage Note (Signed)
Emergency Medicine Provider Triage Evaluation Note  Timothy [name] , a 28 y.o. male  was evaluated in triage.  Pt complains of chest and abdominal pain x 1 month.  SOB with lying down.  Has had work up recently and was told he was ok.    Review of Systems  Positive: + abd sugery, resection, Diverticulitis Negative: NO vomiting or diarrhea  Physical Exam  BP 132/84 (BP Location: Right Arm)   Pulse 92   Temp 98.3 F (36.8 C) (Oral)   Resp 18   Ht 6' (1.829 m)   Wt 122.9 kg   SpO2 95%   BMI 36.75 kg/m  Gen:   Awake, no distress   talkative Resp:  Normal effort  Lungs clear MSK:   Moves extremities without difficulty  Other:   Abd scar present.  BS nl  Medical Decision Making  Medically screening exam initiated at 11:37 AM.  Appropriate orders placed.  Timothy [name] was informed that the remainder of the evaluation will be completed by another provider, this initial triage assessment does not replace that evaluation, and the importance of remaining in the ED until their evaluation is complete.     Tommi Rumps, PA-C 10/02/23 [phone]

## 2023-10-02 NOTE — ED Provider Notes (Signed)
[facility] Provider Note    Event Date/Time   First MD Initiated Contact with Patient 10/02/23 1344     (approximate)   History   Chest Pain   HPI Timothy [name] is a 28 y.o. male with history of anxiety and depression presenting today for chest pain.  Patient states longstanding history of intermittent chest pain and abdominal pain.  He was seen at [facility] ED 3 days ago for his abdominal pain.  At that time he had reassuring laboratory workup with 2 negative troponins and negative CT abdomen/pelvis.  He states that he did not have chest pain there but the chest pain started after he left.  He has difficulty describing it.  He states he has been told in the past that has been anxiety but he was still concerned.  Otherwise denies shortness of breath, abdominal pain, nausea, vomiting, diarrhea, dysuria at this time.  Chart review: Patient has had 4 ED visits in the past month for similar symptoms.     Physical Exam   Triage Vital Signs: ED Triage Vitals  Encounter Vitals Group     BP 10/02/23 1121 132/84     Systolic BP Percentile --      Diastolic BP Percentile --      Pulse Rate 10/02/23 1121 92     Resp 10/02/23 1121 18     Temp 10/02/23 1121 98.3 F (36.8 C)     Temp Source 10/02/23 1121 Oral     SpO2 10/02/23 1121 95 %     Weight 10/02/23 1122 271 lb (122.9 kg)     Height 10/02/23 1122 6' (1.829 m)     Head Circumference --      Peak Flow --      Pain Score 10/02/23 1134 0     Pain Loc --      Pain Education --      Exclude from Growth Chart --     Most recent vital signs: Vitals:   10/02/23 1121 10/02/23 1125  BP: 132/84 132/84  Pulse: 92 92  Resp: 18 18  Temp: 98.3 F (36.8 C) 98.3 F (36.8 C)  SpO2: 95% 95%   Physical Exam: I have reviewed the vital signs and nursing notes. General: Awake, alert, no acute distress.  Nontoxic appearing.  Very anxious appearing. Head:  Atraumatic, normocephalic.   ENT:  EOM intact,  PERRL. Oral mucosa is pink and moist with no lesions. Neck: Neck is supple with full range of motion, No meningeal signs. Cardiovascular:  RRR, No murmurs. Peripheral pulses palpable and equal bilaterally. Respiratory:  Symmetrical chest wall expansion.  No rhonchi, rales, or wheezes.  Good air movement throughout.  No use of accessory muscles.   Musculoskeletal:  No cyanosis or edema. Moving extremities with full ROM Abdomen:  Soft, nontender, nondistended.  Prior midline abdominal scar which is well-healed as well as scar over the left lower quadrant which is well-healed. Neuro:  GCS 15, moving all four extremities, interacting appropriately. Speech clear. Psych: Very anxious. Skin:  Warm, dry, no rash.    ED Results / Procedures / Treatments   Labs (all labs ordered are listed, but only abnormal results are displayed) Labs Reviewed  COMPREHENSIVE METABOLIC PANEL - Abnormal; Notable for the following components:      Result Value   CO2 20 (*)    All other components within normal limits  LIPASE, BLOOD  CBC  TROPONIN I (HIGH SENSITIVITY)  EKG My EKG interpretation: Rate of 81, normal sinus rhythm, normal axis, normal intervals.  No acute ST elevations or depressions.  Consistent with prior baseline   RADIOLOGY    PROCEDURES:  Critical Care performed: No  Procedures   MEDICATIONS ORDERED IN ED: Medications  alum & mag hydroxide-simeth (MAALOX/MYLANTA) 200-200-20 MG/5ML suspension 30 mL (30 mLs Oral Given 10/02/23 1450)  lidocaine (XYLOCAINE) 2 % viscous mouth solution 15 mL (15 mLs Mouth/Throat Given 10/02/23 1450)  LORazepam (ATIVAN) 2 MG/ML concentrated solution 2 mg (2 mg Oral Given 10/02/23 1450)     IMPRESSION / MDM / ASSESSMENT AND PLAN / ED COURSE  I reviewed the triage vital signs and the nursing notes.                              Differential diagnosis includes, but is not limited to, functional abdominal pain, anxiety, pneumonia, pneumothorax, reflux, low  suspicion ACS  Patient's presentation is most consistent with acute complicated illness / injury requiring diagnostic workup.  Patient is a 28 year old male presenting today for chest pain x 2 days.  Does have a history of chronic recurrent chest pain.  Also seen in the ED 3 days ago with negative troponins x 2 as well as negative EKG.  Also had negative CT abdomen/pelvis.  Vital signs are stable and physical exam largely unremarkable other than extreme anxiety in the room.  CBC, lipase, CMP all unremarkable.  EKG consistent with his baseline.  Patient was given GI cocktail as well as oral Ativan for his symptoms.  Patient was signed out pending results of troponin.  If this is normal, suspect this is chronic pain symptoms for which she has been seen extensively in the emergency department with no acute abnormalities.  Would recommend continuing to follow-up with his GI and cardiology teams as needed.  The patient is on the cardiac monitor to evaluate for evidence of arrhythmia and/or significant heart rate changes.     FINAL CLINICAL IMPRESSION(S) / ED DIAGNOSES   Final diagnoses:  Chest pain, unspecified type     Rx / DC Orders   ED Discharge Orders     None        Note:  This document was prepared using Dragon voice recognition software and may include unintentional dictation errors.   Janith Lima, MD 10/02/23 [phone]

## 2023-10-06 ENCOUNTER — Emergency Department
Admission: EM | Admit: 2023-10-06 | Discharge: 2023-10-06 | Disposition: A | Discharge status: Home / Self Care | Payer: 59 | Attending: Emergency Medicine | Admitting: Emergency Medicine

## 2023-10-06 ENCOUNTER — Other Ambulatory Visit: Payer: Self-pay

## 2023-10-06 DIAGNOSIS — R0789 Other chest pain: Secondary | ICD-10-CM | POA: Diagnosis not present

## 2023-10-06 DIAGNOSIS — F419 Anxiety disorder, unspecified: Secondary | ICD-10-CM | POA: Insufficient documentation

## 2023-10-06 LAB — BASIC METABOLIC PANEL
Anion gap: 10 (ref 5–15)
BUN: 10 mg/dL (ref 6–20)
CO2: 25 mmol/L (ref 22–32)
Calcium: 9 mg/dL (ref 8.9–10.3)
Chloride: 104 mmol/L (ref 98–111)
Creatinine, Ser: 0.79 mg/dL (ref 0.61–1.24)
GFR, Estimated: >60 mL/min (ref >60)
Glucose, Bld: 93 mg/dL (ref 70–99)
Potassium: 3.9 mmol/L (ref 3.5–5.1)
Sodium: 139 mmol/L (ref 135–145)

## 2023-10-06 LAB — CBC
HCT: 43.9 % (ref 39.0–52.0)
Hemoglobin: 15.2 g/dL (ref 13.0–17.0)
MCH: 29.9 pg (ref 26.0–34.0)
MCHC: 34.6 g/dL (ref 30.0–36.0)
MCV: 86.4 fL (ref 80.0–100.0)
Platelets: 202 10*3/uL (ref 150–400)
RBC: 5.08 MIL/uL (ref 4.22–5.81)
RDW: 12.6 % (ref 11.5–15.5)
WBC: 6.9 10*3/uL (ref 4.0–10.5)
nRBC: 0 % (ref 0.0–0.2)

## 2023-10-06 LAB — TROPONIN I (HIGH SENSITIVITY)
Troponin I (High Sensitivity): 4 ng/L (ref <18)
Troponin I (High Sensitivity): 4 ng/L (ref <18)

## 2023-10-06 MED ORDER — ACETAMINOPHEN 160 MG/5ML PO SOLN
650.0000 mg | Freq: Once | ORAL | Status: AC
  Administered 2023-10-06: 650 mg via ORAL
  Filled 2023-10-06: qty 20.3

## 2023-10-06 MED ORDER — LIDOCAINE VISCOUS HCL 2 % MT SOLN
15.0000 mL | Freq: Once | OROMUCOSAL | Status: AC
  Administered 2023-10-06: 15 mL via OROMUCOSAL
  Filled 2023-10-06: qty 15

## 2023-10-06 MED ORDER — LORAZEPAM 1 MG PO TABS
1.0000 mg | ORAL_TABLET | Freq: Once | ORAL | Status: DC
  Filled 2023-10-06: qty 1

## 2023-10-06 MED ORDER — ALUM & MAG HYDROXIDE-SIMETH 200-200-20 MG/5ML PO SUSP
30.0000 mL | Freq: Once | ORAL | Status: AC
  Administered 2023-10-06: 30 mL via ORAL
  Filled 2023-10-06: qty 30

## 2023-10-06 NOTE — ED Triage Notes (Signed)
 Pt to ED for chest pain started this am while running. Denies pain currently. NAD noted. RR Even and unlabored

## 2023-10-06 NOTE — ED Provider Notes (Addendum)
 [facility] Provider Note    Event Date/Time   First MD Initiated Contact with Patient 10/06/23 1203     (approximate)   History   Chest Pain   HPI  Timothy [name] is a 28 y.o. male with anxiety, depression who comes in for chest pain.  Patient has had multiple visits for chest pain.  He had recent CT imaging of his abdomen pelvis that was negative.  Patient was last seen here in the emergency room on 2/3 and symptoms resolved after Ativan , GI cocktail.  Patient reports that he had some chest pain that initially started last night he took some over-the-counter stress Gummies that seem to help with pain and he was able to fall asleep but he then got woken up at 630 and had some more pain around 930 therefore presented to the emergency room.  He denies any significant pain at this time.  He denies any shortness of breath.  He reports the pain was an electrical shock to his right side of his chest.  He states that he feels that this is all just his anxiety.  He reports that he has been googling it and he feels that he has psychological pain.  He denies any SI, HI.  He states he is got follow-up with his primary care doctor in a week to discuss his anxiety.  He also has follow-up with GI.  Has not seen a cardiologist.  No risk factors for PE.  Physical Exam   Triage Vital Signs: ED Triage Vitals  Encounter Vitals Group     BP 10/06/23 1014 130/81     Systolic BP Percentile --      Diastolic BP Percentile --      Pulse Rate 10/06/23 1014 84     Resp 10/06/23 1014 18     Temp 10/06/23 1014 98.8 F (37.1 C)     Temp Source 10/06/23 1014 Oral     SpO2 10/06/23 1014 97 %     Weight 10/06/23 0959 268 lb 15.4 oz (122 kg)     Height 10/06/23 0959 6' (1.829 m)     Head Circumference --      Peak Flow --      Pain Score 10/06/23 0959 0     Pain Loc --      Pain Education --      Exclude from Growth Chart --     Most recent vital signs: Vitals:   10/06/23 1014   BP: 130/81  Pulse: 84  Resp: 18  Temp: 98.8 F (37.1 C)  SpO2: 97%     General: Awake, no distress.  CV:  Good peripheral perfusion.  Resp:  Normal effort.  Clear lungs Abd:  No distention.  Soft nontender Other:  No swelling of the legs.  No calf tenderness   ED Results / Procedures / Treatments   Labs (all labs ordered are listed, but only abnormal results are displayed) Labs Reviewed  CBC  BASIC METABOLIC PANEL  TROPONIN I (HIGH SENSITIVITY)  TROPONIN I (HIGH SENSITIVITY)     EKG  My interpretation of EKG:  Rate 71 without any ST elevation, no T wave versions, normal intervals  RADIOLOGY I have reviewed the xray personally and interpreted and no pneumonia   PROCEDURES:  Critical Care performed: No  Procedures   MEDICATIONS ORDERED IN ED: Medications - No data to display   IMPRESSION / MDM / ASSESSMENT AND PLAN / ED COURSE  I  reviewed the triage vital signs and the nursing notes.   Patient's presentation is most consistent with acute presentation with potential threat to life or bodily function.   Differential includes ACS, pneumothorax, pneumonia, PE, dissection.  Patient is clear breath sounds bilaterally oxygen levels are 98% offered chest x-ray but patient declined stating that he gets this all the time he feels he is related to his anxiety or his acid reflux.  Considered PE but patient is PERC negative and has had prior D-dimers that been negative.  Considered dissection but this is been off and on for many months so this seems less likely.  Troponin was negative.  CBC reassuring.  BMP reassuring  Patient given some Tylenol , GI cocktail.  Patient reports having follow-up with his primary doctor next week.  We discussed different options for his anxiety.  He reports not tolerating hydroxyzine  and not improving.  He will discuss with them further recommendations.  We will have him referred to cardiology so he can discuss further with cardiology.   He already reports that he is finally GI.  On attempt of discharging patient the mom is asking for Ativan  since he stated that helped the best we had this previously.  I explained that I would not prescribe a prescription for Ativan  I will be discussion with his primary care doctor.  Discussed with patient and he states that he does not want to get the Ativan  just because he is asking for it.  I explained it as long as he has a ride home.   FINAL CLINICAL IMPRESSION(S) / ED DIAGNOSES   Final diagnoses:  Atypical chest pain  Anxiety     Rx / DC Orders   ED Discharge Orders          Ordered    Ambulatory referral to Cardiology        10/06/23 1357             Note:  This document was prepared using Dragon voice recognition software and may include unintentional dictation errors.   Ernest Ronal BRAVO, MD 10/06/23 1358    Ernest Ronal BRAVO, MD 10/06/23 [phone]

## 2023-10-06 NOTE — ED Notes (Signed)
 Pt is extremely anxious--was intolerant of the IV stick, and complains about how tight the BP is and how, as it tightens, makes his chest hurt even more. He states that last time he had an episode of chest discomfort, that he was given a GI cocktail and is requesting that.  Dr. Ernest was made aware of the above.

## 2023-10-06 NOTE — Discharge Instructions (Signed)
 No signs of heart attack today.  Return to the ER if develop worsening symptoms or any other concerns otherwise that you can follow-up with your GI doctor and I have also placed a referral to cardiology to discuss further with them.  He also should discuss with your primary care doctor your anxiety and different medications you can try for this.

## 2023-10-10 ENCOUNTER — Encounter: Payer: Self-pay | Admitting: Internal Medicine

## 2023-10-10 ENCOUNTER — Other Ambulatory Visit: Payer: Self-pay | Admitting: Internal Medicine

## 2023-10-10 ENCOUNTER — Ambulatory Visit (INDEPENDENT_AMBULATORY_CARE_PROVIDER_SITE_OTHER): Payer: 59 | Admitting: Internal Medicine

## 2023-10-10 VITALS — BP 128/82 | Ht 72.0 in | Wt 273.2 lb

## 2023-10-10 DIAGNOSIS — Z7689 Persons encountering health services in other specified circumstances: Secondary | ICD-10-CM | POA: Diagnosis not present

## 2023-10-10 DIAGNOSIS — F419 Anxiety disorder, unspecified: Secondary | ICD-10-CM | POA: Diagnosis not present

## 2023-10-10 DIAGNOSIS — F32A Depression, unspecified: Secondary | ICD-10-CM | POA: Diagnosis not present

## 2023-10-10 DIAGNOSIS — K219 Gastro-esophageal reflux disease without esophagitis: Secondary | ICD-10-CM | POA: Diagnosis not present

## 2023-10-10 MED ORDER — OMEPRAZOLE 2 MG/ML ORAL SUSPENSION: 20.0000 mg | Freq: Two times a day (BID) | ORAL | 0 refills | Status: DC

## 2023-10-10 MED ORDER — FLUOXETINE HCL 20 MG/5ML PO SOLN: 10.0000 mg | Freq: Every day | ORAL | 1 refills | Status: DC

## 2023-10-10 MED ORDER — HYDROXYZINE HCL 10 MG/5ML PO SYRP: 10.0000 mg | ORAL_SOLUTION | Freq: Three times a day (TID) | ORAL | 0 refills | Status: DC | PRN

## 2023-10-10 NOTE — Progress Notes (Signed)
Subjective:    Patient ID: Timothy [name], male    DOB: [birth year], 28 y.o.   MRN: [id number]  HPI  Discussed the use of AI scribe software for clinical note transcription with the patient, who gave verbal consent to proceed.   Timothy T Kall is a 28 year old male with anxiety and chest pain who presents with ongoing symptoms and medication issues.  He has been to the ER 15 times in the last year.  He experiences ongoing chest pain and anxiety, with episodes of chest pain accompanied by sweating and left arm pain, which he attributes to anxiety. A recent severe episode occurred while he was sleeping in his car, but he did not seek hospital care as he had been there 12 hours prior without resolution.  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 He has not worked since early January 2025. He also has difficulty swallowing pills, limiting medication options. Hydroxyzine was ineffective unless combined with other medications, and he took it daily despite it being prescribed as needed. He is interested in trying liquid fluoxetine, as his father takes it for anxiety, and it is available in liquid form.  He also has severe acid reflux, contributing to pill-swallowing difficulty, and is interested in trying liquid omeprazole after unsuccessful attempts to obtain liquid Protonix due to pharmacy issues.  He lives in a car with his mother at his grandmother's property, impacting his ability to store medications properly. He has [employer], which covers most medical visits, but he faces issues with coverage at certain facilities.      Review of Systems     Past Medical History:  Diagnosis Date   Allergy    Anxiety    Depression    Diverticulitis    Dyspnea    Fainting spell    in middle school   Family history of bone cancer    Family history of breast cancer     Hyperlipidemia     Current Outpatient Medications  Medication Sig Dispense Refill   cetirizine HCl (ZYRTEC) 5 MG/5ML SOLN Take 10 mLs (10 mg total) by mouth daily for 14 days. 140 mL 0   escitalopram (LEXAPRO) 5 MG/5ML solution Take 5 mLs (5 mg total) by mouth daily. 450 mL 1   fluticasone (FLONASE) 50 MCG/ACT nasal spray Place 2 sprays into both nostrils daily. 16 g 6   promethazine-dextromethorphan (PROMETHAZINE-DM) 6.25-15 MG/5ML syrup Take 5 mLs by mouth 4 (four) times daily as needed for cough. 118 mL 0   sucralfate (CARAFATE) 1 GM/10ML suspension Take 10 mLs (1 g total) by mouth 4 (four) times daily. 420 mL 0   No current facility-administered medications for this visit.    No Known Allergies  Family History  Problem Relation Age of Onset   Alcohol abuse Mother    Depression Mother    Cervical cancer Mother    Depression Father    Anxiety disorder Father    Healthy Sister    Alport syndrome Maternal Aunt    Heart disease Maternal Grandmother    Diabetes Maternal Grandmother    Cancer Maternal Grandmother        breast cancer   Alport syndrome Maternal Grandmother    Cancer Paternal Grandfather        Bone cancer    Social History   Socioeconomic History   Marital status:  Single    Spouse name: Not on file   Number of children: Not on file   Years of education: Not on file   Highest education level: High school graduate  Occupational History   Not on file  Tobacco Use   Smoking status: Former    Current packs/day: 0.25    Types: Cigarettes, E-cigarettes   Smokeless tobacco: Former    Types: Snuff   Tobacco comments:    pt smoke pack a week  Vaping Use   Vaping status: Every Day   Last attempt to quit: 09/09/2019   Substances: Nicotine, Flavoring  Substance and Sexual Activity   Alcohol use: Yes    Alcohol/week: 4.0 standard drinks of alcohol    Types: 4 Cans of beer per week    Comment: occassionally   Drug use: No   Sexual activity: Not Currently   Other Topics Concern   Not on file  Social History Narrative   Not on file   Social Drivers of Health   Financial Resource Strain: Not on file  Food Insecurity: Not on file  Transportation Needs: Not on file  Physical Activity: Not on file  Stress: Not on file  Social Connections: Not on file  Intimate Partner Violence: Not on file     Constitutional: Denies fever, malaise, fatigue, headache or abrupt weight changes.  HEENT: Denies eye pain, eye redness, ear pain, ringing in the ears, wax buildup, runny nose, nasal congestion, bloody nose, or sore throat. Respiratory: Denies difficulty breathing, shortness of breath, cough or sputum production.   Cardiovascular: Patient reports chronic intermittent chest pain.  Denies chest tightness, palpitations or swelling in the hands or feet.  Gastrointestinal: Patient reports intermittent reflux.  Denies bloating, constipation, diarrhea or blood in the stool.  GU: Denies urgency, frequency, pain with urination, burning sensation, blood in urine, odor or discharge. Musculoskeletal: Patient reports intermittent left arm pain.  Denies decrease in range of motion, difficulty with gait,  or joint swelling.  Skin: Denies redness, rashes, lesions or ulcercations.  Neurological: Patient reports insomnia.  Denies dizziness, difficulty with memory, difficulty with speech or problems with balance and coordination.  Psych: Patient has a history of anxiety and depression.  Denies SI/HI.  No other specific complaints in a complete review of systems (except as listed in HPI above).  Objective:   Physical Exam BP 128/82 (BP Location: Left Arm, Patient Position: Sitting, Cuff Size: Large)   Ht 6' (1.829 m)   Wt 273 lb 3.2 oz (123.9 kg)   BMI 37.05 kg/m    Wt Readings from Last 3 Encounters:  10/06/23 268 lb 15.4 oz (122 kg)  10/02/23 271 lb (122.9 kg)  09/12/23 271 lb 2.7 oz (123 kg)    General: Appears his stated age, obese, in NAD. Skin: Warm,  dry and intact.  Cardiovascular: Normal rate  Pulmonary/Chest: Normal effort and positive vesicular breath sounds. No respiratory distress. No wheezes, rales or ronchi noted.  Musculoskeletal:  No difficulty with gait.  Neurological: Alert and oriented. Coordination normal.  Psychiatric: Disorganized thoughts.  Anxious appearing.   BMET    Component Value Date/Time   NA 139 10/06/2023 1257   NA 138 12/16/2014 0839   K 3.9 10/06/2023 1257   K 3.9 12/16/2014 0839   CL 104 10/06/2023 1257   CL 106 12/16/2014 0839   CO2 25 10/06/2023 1257   CO2 26 12/16/2014 0839   GLUCOSE 93 10/06/2023 1257   GLUCOSE 110 (H) 12/16/2014 1610  BUN 10 10/06/2023 1257   BUN 11 12/16/2014 0839   CREATININE 0.79 10/06/2023 1257   CREATININE 0.97 07/29/2022 0905   CALCIUM 9.0 10/06/2023 1257   CALCIUM 8.8 (L) 12/16/2014 0839   GFRNONAA >60 10/06/2023 1257   GFRNONAA >60 12/16/2014 0839   GFRAA >60 12/09/2019 0555   GFRAA >60 12/16/2014 0839    Lipid Panel     Component Value Date/Time   CHOL 160 07/29/2022 0905   TRIG 166 (H) 07/29/2022 0905   HDL 35 (L) 07/29/2022 0905   CHOLHDL 4.6 07/29/2022 0905   LDLCALC 99 07/29/2022 0905    CBC    Component Value Date/Time   WBC 6.9 10/06/2023 1016   RBC 5.08 10/06/2023 1016   HGB 15.2 10/06/2023 1016   HGB 14.6 12/16/2014 0839   HCT 43.9 10/06/2023 1016   HCT 44.0 12/16/2014 0839   PLT 202 10/06/2023 1016   PLT 233 12/16/2014 0839   MCV 86.4 10/06/2023 1016   MCV 88 12/16/2014 0839   MCH 29.9 10/06/2023 1016   MCHC 34.6 10/06/2023 1016   RDW 12.6 10/06/2023 1016   RDW 13.8 12/16/2014 0839   LYMPHSABS 3.3 05/12/2023 1607   LYMPHSABS 2.9 12/16/2014 0839   MONOABS 1.2 (H) 05/12/2023 1607   MONOABS 1.2 (H) 12/16/2014 0839   EOSABS 0.1 05/12/2023 1607   EOSABS 0.3 12/16/2014 0839   BASOSABS 0.0 05/12/2023 1607   BASOSABS 0.1 12/16/2014 0839    Hgb A1C Lab Results  Component Value Date   HGBA1C 5.6 07/29/2022            Assessment & Plan:    Assessment and Plan    Anxiety Severe, untreated anxiety with a history of poor tolerance to Lexapro due to vertigo-like symptoms. Patient unable to swallow pills, limiting medication options. Possible undiagnosed bipolar disorder suggested. -Start liquid Prozac (fluoxetine) 10 mg as a daily regimen. -Continue liquid hydroxyzine 10 mg up to three times a day as needed for acute anxiety symptoms. -Consider referral to a psychiatrist for further evaluation and management, and a therapist for cognitive behavioral therapy.  Gastroesophageal Reflux Disease (GERD) Severe acid reflux, possibly contributing to difficulty swallowing pills. -Trial of liquid omeprazole twice daily, pending insurance approval.  Follow-up Encourage patient to find a primary care doctor within his insurance network to ensure continuity of care and coverage of services.      RTC in 6 months, follow-up chronic conditions [name], NP

## 2023-10-10 NOTE — Patient Instructions (Signed)

## 2023-10-11 ENCOUNTER — Ambulatory Visit: Payer: 59 | Attending: Student | Admitting: Student

## 2023-10-11 ENCOUNTER — Encounter: Payer: Self-pay | Admitting: Student

## 2023-10-11 VITALS — BP 124/64 | HR 98 | Ht 72.0 in | Wt 271.2 lb

## 2023-10-11 DIAGNOSIS — R0602 Shortness of breath: Secondary | ICD-10-CM | POA: Diagnosis not present

## 2023-10-11 DIAGNOSIS — K219 Gastro-esophageal reflux disease without esophagitis: Secondary | ICD-10-CM

## 2023-10-11 DIAGNOSIS — R079 Chest pain, unspecified: Secondary | ICD-10-CM | POA: Diagnosis not present

## 2023-10-11 DIAGNOSIS — F32A Depression, unspecified: Secondary | ICD-10-CM

## 2023-10-11 DIAGNOSIS — F419 Anxiety disorder, unspecified: Secondary | ICD-10-CM

## 2023-10-11 DIAGNOSIS — F410 Panic disorder [episodic paroxysmal anxiety] without agoraphobia: Secondary | ICD-10-CM | POA: Diagnosis not present

## 2023-10-11 DIAGNOSIS — R0789 Other chest pain: Secondary | ICD-10-CM | POA: Diagnosis not present

## 2023-10-11 NOTE — Patient Instructions (Signed)
Medication Instructions:   Your physician recommends that you continue on your current medications as directed. Please refer to the Current Medication list given to you today.  *If you need a refill on your cardiac medications before your next appointment, please call your pharmacy*   Lab Work:  None Ordered  If you have labs (blood work) drawn today and your tests are completely normal, you will receive your results only by: MyChart Message (if you have MyChart) OR A paper copy in the mail If you have any lab test that is abnormal or we need to change your treatment, we will call you to review the results.   Testing/Procedures:  Your physician has requested that you have an echocardiogram. Echocardiography is a painless test that uses sound waves to create images of your heart. It provides your doctor with information about the size and shape of your heart and how well your heart's chambers and valves are working. This procedure takes approximately one hour. There are no restrictions for this procedure. Please do NOT wear cologne, perfume, aftershave, or lotions (deodorant is allowed). Please arrive 15 minutes prior to your appointment time.  Please note: We ask at that you not bring children with you during ultrasound (echo/ vascular) testing. Due to room size and safety concerns, children are not allowed in the ultrasound rooms during exams. Our front office staff cannot provide observation of children in our lobby area while testing is being conducted. An adult accompanying a patient to their appointment will only be allowed in the ultrasound room at the discretion of the ultrasound technician under special circumstances. We apologize for any inconvenience.    Follow-Up: At [facility], you and your health needs are our priority.  As part of our continuing mission to provide you with exceptional heart care, we have created designated Provider Care Teams.  These Care Teams  include your primary Cardiologist (physician) and Advanced Practice Providers (APPs -  Physician Assistants and Nurse Practitioners) who all work together to provide you with the care you need, when you need it.  We recommend signing up for the patient portal called "MyChart".  Sign up information is provided on this After Visit Summary.  MyChart is used to connect with patients for Virtual Visits (Telemedicine).  Patients are able to view lab/test results, encounter notes, upcoming appointments, etc.  Non-urgent messages can be sent to your provider as well.   To learn more about what you can do with MyChart, go to [email or web address].    Your next appointment:   6 month(s)  Provider:   You may see Lorine Bears, MD or one of the following Advanced Practice Providers on your designated Care Team:   Nicolasa Ducking, NP Eula Listen, PA-C Cadence Fransico Michael, PA-C Charlsie Quest, NP Carlos Levering, NP

## 2023-10-11 NOTE — Telephone Encounter (Signed)
Requested medication (s) are due for refill today:   See pharmacy message  Non formulary  Requested medication (s) are on the active medication list:     Future visit scheduled:      Last ordered: 10/10/2023   Non formulary   Cash price $1800    Requested Prescriptions  Pending Prescriptions Disp Refills   omeprazole (PRILOSEC) 40 MG capsule [Pharmacy Med Name: OMEPRAZOLE DR 40 MG CAPSULE]  0     Gastroenterology: Proton Pump Inhibitors Passed - 10/11/2023  8:23 AM      Passed - Valid encounter within last 12 months    Recent Outpatient Visits           1 month ago Anxiety and depression   [facility] [place], Salvadore Oxford, NP   3 months ago Acute nasopharyngitis   [facility] [place], Salvadore Oxford, NP   7 months ago Viral URI with cough   [facility] [place], [place] W, NP   9 months ago Seasonal allergic rhinitis due to pollen   [facility] [place], Salvadore Oxford, NP   1 year ago Hypoglycemia   [facility] [place], Salvadore Oxford, NP       Future Appointments             In 5 days Wyline Mood, MD [facility]

## 2023-10-11 NOTE — Progress Notes (Signed)
Cardiology Clinic Note   Date: 10/11/2023 ID: Timothy [name], DOB [birth year], MRN [id number]  Primary Cardiologist:  Timothy Bears, MD  Chief Complaint   Timothy [name] is a 28 y.o. male who presents to the clinic today for evaluation of chest pain.   Patient Profile   Timothy [name] is followed by Timothy [name] for the history outlined below.      Past medical history significant for: Chest pain/dyspnea. Exercise stress test 04/01/2022: Normal treadmill stress test showed no evidence of ischemia or arrhythmia.  Excellent exercise capacity. Hypertriglyceridemia. GERD. Anxiety and depression. Diverticulitis. S/p sigmoid colon resection. Tobacco abuse.  In summary, patient was first evaluated by Timothy [name] on 03/18/2022 for chest pain at the request of Timothy Reaper, MD. patient had 17 ED visits over the prior 6 months for various symptoms including abdominal pain and chest pain.  He has severe reflux and plans for GI evaluation in the near future with an EGD.  He reported intermittent substernal chest pain radiating to his left arm occurring at rest and with exertion.  He also reported exertional dyspnea.  Troponin had been normal.  EKG with no ischemic changes.  He was evaluated with exercise stress test which was normal as detailed above.  Patient presented to the ED on 10/02/2023 with complaints of chest pain, body aches, abdominal pain x 1 month.  He was seen at [facility] 2 days prior with an unremarkable workup.  Initial labs: Sodium 138, potassium 3.8, creatinine 0.75, BUN 11, AST 26, ALT 43, WBC 81, hemoglobin 15.5, troponin negative.  Patient with multiple ED visits in the month prior for similar complaints.  Symptoms resolved with Ativan and GI cocktail.  Patient was discharged to follow-up with GI as an outpatient.  Patient presented to the ED on 10/06/2023 for chest pain while running.  Patient reported electric like right-sided chest pain he attributed to anxiety.  Labs were  unremarkable.  Troponin negative x 2.  Patient was treated with Tylenol and GI cocktail.  He requested Ativan but it was not provided for him.  He was discharged home.  Patient was seen by PCP Timothy [name] on 10/10/2023.  He was started on liquid Prozac and liquid omeprazole.  Patient was seen at [facility] ED early in the morning on 10/11/2023 for chest pain.  His workup was unremarkable.  He he attributed symptoms to anxiety.  He was discharged to follow-up as an outpatient.     History of Present Illness    Today, patient is accompanied by his mother. He reports continued chest pain. He has been seen in the ED several times over the past month for similar symptoms. He admits to suffering from anxiety and medications have not been helping. He was started on prozac yesterday but has not started it yet. He requires liquid medications secondary to difficulty swallowing pills. He wonders if this is related to his severe GERD. His chest pain is responsive to Ativan and GI cocktail. He was also started on liquid omeprazole. He reports the only thing that can calm him down is alcohol or Ativan. He is awaiting paperwork from behavioral health to be able to get in with a therapist. He reports some dyspnea at rest but not typically with exertion. He and mom are very anxious that something could be wrong with his heart. Discussed results of recent labs and stress test performed in August 2023. All questions answered.       ROS: All other  systems reviewed and are otherwise negative except as noted in History of Present Illness.  EKGs/Labs Reviewed       EKG from 10/09/2023 performed in ED personally reviewed and demonstrated NSR with sinus arrhythmia 71 bpm.   10/02/2023: ALT 43; AST 26 10/06/2023: BUN 10; Creatinine, Ser 0.79; Potassium 3.9; Sodium 139   10/06/2023: Hemoglobin 15.2; WBC 6.9   Physical Exam    VS:  BP 124/64   Pulse 98   Ht 6' (1.829 m)   Wt 271 lb 3.2 oz (123 kg)   SpO2 97%    BMI 36.78 kg/m  , BMI Body mass index is 36.78 kg/m.  GEN: Well nourished, well developed, in no acute distress. Neck: No JVD or carotid bruits. Cardiac:  RRR. No murmurs. No rubs or gallops.   Respiratory:  Respirations regular and unlabored. Clear to auscultation without rales, wheezing or rhonchi. GI: Soft, nontender, nondistended. Extremities: Radials/DP/PT 2+ and equal bilaterally. No clubbing or cyanosis. No edema.  Skin: Warm and dry, no rash. Neuro: Strength intact.  Assessment & Plan   Chest pain/Dyspnea Treadmill stress test August 2023 was normal with no evidence of ischemia or arrhythmia. Patient with multiple visits to [facility] ED and [facility] ED for chest pain/abdominal pain responsive to ativan and GI cocktail. Patient reports continued chest pain that is responsive to Ativan and GI cocktail. He reports dyspnea at rest but not typically with exertion. He does have a high level of anxiety and his mom also mentions he has a short temper. Discussed recent lab work performed at ED visits and past stress test. EKG performed in ED on 2/10 was personally reviewed and demonstrated NSR with sinus arrhythmia rate 71 bpm.  -Will obtain an echo.   GERD Patient with multiple ED visit as above. He has a history of severe GERD.  He reports upcoming visit with GI next week.  -Continue liquid omeprazole.   Anxiety Patient was seen one day ago by PCP and started on liquid Prozac. He reports he has not started it yet. He is waiting on paperwork from [facility] so he can get in with a therapist. His mom reports he has a lot of anxiety and a short temper. He is anxious appearing today. He reports he has been on medications in the past that have not worked. He has stopped medications abruptly.  -Encouraged consistency with taking Prozac.  -Suggested he contact behavioral health on status of paperwork so he can get in ASAP.   Disposition: Echo. Return in 6 months or sooner as needed.           Signed, Etta Grandchild. Sheron Tallman, DNP, [name]-C

## 2023-10-12 DIAGNOSIS — R079 Chest pain, unspecified: Secondary | ICD-10-CM | POA: Diagnosis not present

## 2023-10-13 DIAGNOSIS — R1032 Left lower quadrant pain: Secondary | ICD-10-CM | POA: Diagnosis not present

## 2023-10-16 ENCOUNTER — Ambulatory Visit (INDEPENDENT_AMBULATORY_CARE_PROVIDER_SITE_OTHER): Payer: 59 | Admitting: Gastroenterology

## 2023-10-16 ENCOUNTER — Encounter: Payer: Self-pay | Admitting: Gastroenterology

## 2023-10-16 VITALS — BP 129/83 | HR 85 | Temp 98.9°F | Wt 274.4 lb

## 2023-10-16 DIAGNOSIS — K581 Irritable bowel syndrome with constipation: Secondary | ICD-10-CM

## 2023-10-16 DIAGNOSIS — R1032 Left lower quadrant pain: Secondary | ICD-10-CM | POA: Diagnosis not present

## 2023-10-16 NOTE — Progress Notes (Signed)
 Wyline Mood MD, MRCP(U.K) [address]  [address]  [place], [place] [address]  Main: [phone]  Fax: [phone]   Primary Care Physician: Lorre Munroe, NP  Primary Gastroenterologist:  Dr. Wyline Mood   Chief Complaint  Patient presents with   Abdominal Pain   Gastroesophageal Reflux    HPI: Timothy [name] is a 28 y.o. male   Summary of history :   Last seen at our office back in 02/2022. Prior evaluation including ED visits for   nonspecific chest pain. CT scan on 02/13/2021 showed a partial colonic resection in the sigmoid colon with a prior  a history of diverticulitis in 2019 complicated by perforation at sigmoid resection.small hernia also seen .  11/01/2018: Colonoscopy 15 mm polyp was resected in the transverse colon.  The polyp was a tubular adenoma.  He is due for surveillance colonoscopy.   He has been having episodes of chest pain feels like a pressure sometimes radiating to his lower abdomen sometimes radiating to his neck sometimes short of breath sometimes sharp in nature.  He has been advised to see a cardiologist has seen Dr. Welton Flakes but does not have the money to proceed with a stress test.  It is pending.  He has was some constipation but cannot quantify how often he has a bowel movement tried MiraLAX to low-dose which has not really helped.  He says he cannot swallow pills for acid reflux he wants a liquid.  Interval history   7/17/20203-10/16/2023   Multiple ER visits for chest pain . Seen cardiology 5 days back for the same  Has had negative cardiac work up , echo pending .   [facility] on 10/13/2023: CT abdomen negative for LLQ pain .    05/26/2022: EGD:  duodenal polyp- bx- benign, Esophageal bx normal . Colonoscopy normal.   09/12/2023: RUQ Abdomen : normal .   He says that after his recent ER visit he was started on Prozac which has helped him with a lot of his symptoms he has suffered from headaches at this point of time.  His main complaint is  left lower quadrant pain going on for a few months which is preceded by constipation and relieved after bowel movement.  Not taking any OTC medications for the same he has tried to increase his dietary fiber.  He says he has difficulty swallowing any pills for laxatives.  No other complaints.   Current Outpatient Medications  Medication Sig Dispense Refill   FLUoxetine (PROZAC) 20 MG/5ML solution Take 2.5 mLs (10 mg total) by mouth daily. 450 mL 1   hydrOXYzine (ATARAX) 10 MG/5ML syrup Take 5 mLs (10 mg total) by mouth 3 (three) times daily as needed. (Patient not taking: Reported on 10/16/2023) 360 mL 0   omeprazole (KONVOMEP) 2 mg/mL SUSP oral suspension Take 10 mLs (20 mg total) by mouth 2 (two) times daily before a meal. (Patient not taking: Reported on 10/16/2023) 720 mL 0   No current facility-administered medications for this visit.    Allergies as of 10/16/2023   (No Known Allergies)      ROS:  General: Negative for anorexia, weight loss, fever, chills, fatigue, weakness. ENT: Negative for hoarseness, difficulty swallowing , nasal congestion. CV: Negative for chest pain, angina, palpitations, dyspnea on exertion, peripheral edema.  Respiratory: Negative for dyspnea at rest, dyspnea on exertion, cough, sputum, wheezing.  GI: See history of present illness. GU:  Negative for dysuria, hematuria, urinary incontinence, urinary frequency, nocturnal  urination.  Endo: Negative for unusual weight change.    Physical Examination:   BP 129/83   Pulse 85   Temp 98.9 F (37.2 C) (Oral)   Wt 274 lb 6.4 oz (124.5 kg)   BMI 37.22 kg/m   General: Well-nourished, well-developed in no acute distress.  Eyes: No icterus. Conjunctivae pink. Neuro: Alert and oriented x 3.  Grossly intact. Skin: Warm and dry, no jaundice.   Psych: Alert and cooperative, normal mood and affect.   Imaging Studies: No results found.  Assessment and Plan:   Timothy [name] is a 28 y.o. y/o male here to  follow-up for abdominal pain left lower quadrant with features suggestive of irritable bowel syndrome with constipation.  He is not on any medications presently.  He is not interested in pills as he says he has difficulty swallowing them in the past.  Plan 1.  High-fiber diet 2.  Commence on MiraLAX 1 capful twice a day samples have been provided 3.  I have also given him samples of probiotics.  I also believe a part of his symptoms may be related to anxiety and he is presently on Prozac which should hopefully help him with his GI symptoms to.  Previously has had noncardiac chest pain with normal endoscopy evaluation if that recurs in the future we could consider esophageal manometric and impedance pH testing to evaluate for esophageal hypersensitivity syndrome . 4.  Advised him to restart his omeprazole for his noncardiac chest pain which she is not presently taking.    Dr Wyline Mood  MD,MRCP [facility]) Follow up in as needed

## 2023-10-24 ENCOUNTER — Ambulatory Visit: Payer: 59 | Admitting: Gastroenterology

## 2023-10-27 DIAGNOSIS — R42 Dizziness and giddiness: Secondary | ICD-10-CM | POA: Diagnosis not present

## 2023-10-27 DIAGNOSIS — R519 Headache, unspecified: Secondary | ICD-10-CM | POA: Diagnosis not present

## 2023-10-28 DIAGNOSIS — M545 Low back pain, unspecified: Secondary | ICD-10-CM | POA: Diagnosis not present

## 2023-10-29 ENCOUNTER — Other Ambulatory Visit: Payer: Self-pay

## 2023-10-29 ENCOUNTER — Emergency Department

## 2023-10-29 ENCOUNTER — Emergency Department
Admission: EM | Admit: 2023-10-29 | Discharge: 2023-10-30 | Disposition: A | Discharge status: Home / Self Care | Attending: Emergency Medicine | Admitting: Emergency Medicine

## 2023-10-29 DIAGNOSIS — M5441 Lumbago with sciatica, right side: Secondary | ICD-10-CM | POA: Insufficient documentation

## 2023-10-29 DIAGNOSIS — M545 Low back pain, unspecified: Secondary | ICD-10-CM | POA: Diagnosis not present

## 2023-10-29 DIAGNOSIS — K5900 Constipation, unspecified: Secondary | ICD-10-CM | POA: Diagnosis not present

## 2023-10-29 DIAGNOSIS — M47816 Spondylosis without myelopathy or radiculopathy, lumbar region: Secondary | ICD-10-CM | POA: Diagnosis not present

## 2023-10-29 NOTE — ED Triage Notes (Signed)
 Pt arrived POV for lower back pain x2 days, denies any in injury, reports pain worse with laying down or bending over, reports hard to "bend all the way down like I'm supposed to at work cus the pain is the worse", pt describe hard to get out of bed d/t pain, denies pain radiating down his legs, but reports "butt and thigh pain bilaterally".  Pt ambulatory to triage with steady gait. A&O x4, NAD noted, VSS.

## 2023-10-30 MED ORDER — CYCLOBENZAPRINE HCL 5 MG PO TABS: 5.0000 mg | ORAL_TABLET | Freq: Three times a day (TID) | ORAL | 0 refills | Status: DC | PRN

## 2023-10-30 MED ORDER — LIDOCAINE 5 % EX PTCH
1.0000 | MEDICATED_PATCH | CUTANEOUS | Status: DC
Start: 2023-10-30 — End: 2023-10-30
  Administered 2023-10-30: 1 via TRANSDERMAL
  Filled 2023-10-30: qty 1

## 2023-10-30 MED ORDER — KETOROLAC TROMETHAMINE 30 MG/ML IJ SOLN
30.0000 mg | Freq: Once | INTRAMUSCULAR | Status: AC
  Administered 2023-10-30: 30 mg via INTRAMUSCULAR
  Filled 2023-10-30: qty 1

## 2023-10-30 NOTE — ED Provider Notes (Signed)
 [facility] Provider Note    Event Date/Time   First MD Initiated Contact with Patient 10/29/23 2305     (approximate)   History   Back Pain   HPI Timothy [name] is a 28 y.o. male who presents for evaluation of right his symptoms.  His biggest concern is low back pain it has been worse for couple days.  He works in a job that requires him to move around a lot and lift things but he does not think he injured himself at work in a particular moment although he acknowledges he may have pulled something.  He said that when he bends over and makes the pain worse, mostly down his right buttock and into his right leg with sometimes on both sides.  He has had no numbness or tingling and is able to ambulate even though it hurts to do so.  No urinary retention or urinary incontinence.  He also reports that for a long time he has been constipated and he thinks that might be making him feel worse now.  He drinks prune juice but says that he cannot swallow pills and he does not like the taste of MiraLAX or milk of magnesia, so he has not taken any.  He has not tried anything else.  He said that the back pain feels better after he has a bowel movement but he thinks he needs to go more often.  The constipation is a chronic issue for him but the pain in his lower back has been worse recently.  He stated that he has had a lot more gas than usual over the last couple of days so that makes him think he needs to have a bigger bowel movement.     Physical Exam   Triage Vital Signs: ED Triage Vitals  Encounter Vitals Group     BP 10/29/23 2140 130/84     Systolic BP Percentile --      Diastolic BP Percentile --      Pulse Rate 10/29/23 2140 84     Resp 10/29/23 2140 16     Temp 10/29/23 2140 98.7 F (37.1 C)     Temp Source 10/29/23 2140 Oral     SpO2 10/29/23 2140 99 %     Weight 10/29/23 2144 122.5 kg (270 lb)     Height 10/29/23 2144 1.829 m (6')     Head Circumference --       Peak Flow --      Pain Score 10/29/23 2143 6     Pain Loc --      Pain Education --      Exclude from Growth Chart --     Most recent vital signs: Vitals:   10/29/23 2140 10/30/23 0045  BP: 130/84 133/82  Pulse: 84   Resp: 16 16  Temp: 98.7 F (37.1 C)   SpO2: 99%     General: Awake, no distress.  CV:  Good peripheral perfusion.  Resp:  Normal effort. Speaking easily and comfortably, no accessory muscle usage nor intercostal retractions.   Abd:  No distention.  No tenderness to palpation all throughout his abdomen and it is soft, not rigid or firm. Other:  Ambulatory without difficulty.  Patient has some reproducible pain/tenderness with right straight leg raise.  He reports tenderness to palpation of the soft tissues of the right lumbar spine region, but he has no tenderness along the midline and no palpable or visible deformities of his  lower back.  No tenderness to percussion of either flank.   ED Results / Procedures / Treatments   Labs (all labs ordered are listed, but only abnormal results are displayed) Labs Reviewed - No data to display   RADIOLOGY I viewed and interpreted the patient's lumbar spine x-rays and I see no evidence of fracture or listhesis or any other concerning or emergent abnormalities.  I also read the radiologist's report, which confirmed no acute findings.   PROCEDURES:  Critical Care performed: No  Procedures    IMPRESSION / MDM / ASSESSMENT AND PLAN / ED COURSE  I reviewed the triage vital signs and the nursing notes.                              Differential diagnosis includes, but is not limited to, constipation, dietary indiscretions, musculoskeletal strain, sciatica, pinched nerve, cauda equina syndrome, discitis/osteomyelitis, transverse myelitis.  Patient's presentation is most consistent with acute presentation with potential threat to life or bodily function.  Labs/studies ordered: Lumbar spine  x-rays  Interventions/Medications given:  Medications  lidocaine (LIDODERM) 5 % 1 patch (1 patch Transdermal Patch Applied 10/30/23 0034)  ketorolac (TORADOL) 30 MG/ML injection 30 mg (30 mg Intramuscular Given 10/30/23 0035)    (Note:  hospital course my include additional interventions and/or labs/studies not listed above.)   Patient's physical exam is reassuring.  His back pain is consistent with musculoskeletal strain without any evidence of an emergent nerve impingement or need for MRI.  I had my usual customary management discussion with him about this and wrote a prescription for cyclobenzaprine and recommended the use of NSAIDs.  He seems more focused on his constipation which she acknowledges is a chronic issue.  I stressed to him that he should take over-the-counter remedies even if he does not like the way they taste, such as MiraLAX.  I also pointed out that there are other remedies including stool softeners and I gave him my usual constipation recommendations and printed form for him to refer back to.  I recommended he follow-up with his primary care provider and I gave my usual customary return precautions.  No evidence of an emergent medical condition at this time.         FINAL CLINICAL IMPRESSION(S) / ED DIAGNOSES   Final diagnoses:  Acute right-sided low back pain with right-sided sciatica  Constipation, unspecified constipation type     Rx / DC Orders   ED Discharge Orders          Ordered    cyclobenzaprine (FLEXERIL) 5 MG tablet  3 times daily PRN        10/30/23 0048             Note:  This document was prepared using Dragon voice recognition software and may include unintentional dictation errors.   Loleta Rose, MD 10/30/23 [phone]

## 2023-10-30 NOTE — Discharge Instructions (Addendum)
 You were seen in the emergency department today for several issues.    For your constipation, we recommend that you use one or more of the following over-the-counter medications in the order described:   1)  Miralax (powder):  This medication works by drawing additional fluid into your intestines and helps to flush out your stool.  Mix the powder with water or juice according to label instructions.  Be sure to use the recommended amount of water or juice when you mix up the powder.  Plenty of fluids will help to prevent constipation. 2)  Colace (or Dulcolax) 100 mg:  This is a stool softener, and you may take it once or twice a day as needed. 3)  Senna tablets:  This is a bowel stimulant that will help "push" out your stool. It is the next step to add after you have tried a stool softener. 4)  Magnesium citrate: This is typically sold in a clear glass bottle also purchased without the need for prescription.  You can drink the bottle and it typically stimulates your bowels within a short period of time.  You may also want to consider using glycerin suppositories, which you insert into your rectum.  You hold it in place and is dissolves and softens your stool and stimulates your bowels.  You could also consider using an enema, which is also available over the counter.  Remember that narcotic pain medications are constipating, so avoid them or minimize their use.  Drink plenty of fluids.  For your low back pain, try stretching and continue to move around, which should help, even though the pain may make you want to remain still.  Take your medications as prescribed (you can asked the pharmacist, but you should be able to crush this medication and eat it in some applesauce) and follow-up with your regular provider.  We recommend that you also take 600 mg of ibuprofen 3 times a day with meals for the next week; that should also help your back feel better.  Please return to the Emergency Department  immediately if you develop new or worsening symptoms that concern you, such as (but not limited to) fever > 101 degrees, severe abdominal pain, or persistent vomiting.

## 2023-11-02 ENCOUNTER — Ambulatory Visit: Payer: 59

## 2023-11-08 DIAGNOSIS — G43809 Other migraine, not intractable, without status migrainosus: Secondary | ICD-10-CM | POA: Diagnosis not present

## 2023-11-08 DIAGNOSIS — F17290 Nicotine dependence, other tobacco product, uncomplicated: Secondary | ICD-10-CM | POA: Diagnosis not present

## 2023-11-08 DIAGNOSIS — F17210 Nicotine dependence, cigarettes, uncomplicated: Secondary | ICD-10-CM | POA: Diagnosis not present

## 2023-11-08 DIAGNOSIS — Z1152 Encounter for screening for COVID-19: Secondary | ICD-10-CM | POA: Diagnosis not present

## 2023-11-08 DIAGNOSIS — R4589 Other symptoms and signs involving emotional state: Secondary | ICD-10-CM | POA: Diagnosis not present

## 2023-11-08 DIAGNOSIS — R519 Headache, unspecified: Secondary | ICD-10-CM | POA: Diagnosis not present

## 2023-11-08 DIAGNOSIS — F064 Anxiety disorder due to known physiological condition: Secondary | ICD-10-CM | POA: Diagnosis not present

## 2023-11-10 ENCOUNTER — Encounter: Payer: Self-pay | Admitting: Gastroenterology

## 2023-11-10 ENCOUNTER — Encounter: Payer: Self-pay | Admitting: Internal Medicine

## 2023-11-12 ENCOUNTER — Emergency Department
Admission: EM | Admit: 2023-11-12 | Discharge: 2023-11-12 | Disposition: A | Discharge status: Home / Self Care | Attending: Emergency Medicine | Admitting: Emergency Medicine

## 2023-11-12 ENCOUNTER — Other Ambulatory Visit: Payer: Self-pay

## 2023-11-12 ENCOUNTER — Emergency Department

## 2023-11-12 DIAGNOSIS — G44209 Tension-type headache, unspecified, not intractable: Secondary | ICD-10-CM | POA: Insufficient documentation

## 2023-11-12 DIAGNOSIS — R519 Headache, unspecified: Secondary | ICD-10-CM | POA: Diagnosis not present

## 2023-11-12 LAB — RESP PANEL BY RT-PCR (RSV, FLU A&B, COVID)  RVPGX2
Influenza A by PCR: NEGATIVE
Influenza B by PCR: NEGATIVE
Resp Syncytial Virus by PCR: NEGATIVE
SARS Coronavirus 2 by RT PCR: NEGATIVE

## 2023-11-12 MED ORDER — KETOROLAC TROMETHAMINE 30 MG/ML IJ SOLN
30.0000 mg | Freq: Once | INTRAMUSCULAR | Status: AC
Start: 2023-11-12 — End: 2023-11-12
  Administered 2023-11-12: 30 mg via INTRAMUSCULAR
  Filled 2023-11-12: qty 1

## 2023-11-12 MED ORDER — DIPHENHYDRAMINE HCL 50 MG/ML IJ SOLN
50.0000 mg | Freq: Once | INTRAMUSCULAR | Status: AC
  Administered 2023-11-12: 50 mg via INTRAMUSCULAR
  Filled 2023-11-12: qty 1

## 2023-11-12 MED ORDER — ACETAMINOPHEN 325 MG PO TABS
650.0000 mg | ORAL_TABLET | Freq: Once | ORAL | Status: DC
  Filled 2023-11-12: qty 2

## 2023-11-12 MED ORDER — ONDANSETRON 4 MG PO TBDP
4.0000 mg | ORAL_TABLET | Freq: Once | ORAL | Status: AC
  Administered 2023-11-12: 4 mg via ORAL
  Filled 2023-11-12: qty 1

## 2023-11-12 NOTE — ED Triage Notes (Signed)
 Pt to ed from home via POV for headache x 3 days. Pt is caox4, in no acute distress and ambulatory in triage. Pt has taken OTC tylenol with no relief. Pt denies any blurred vision, N/V or other symptoms

## 2023-11-12 NOTE — Discharge Instructions (Signed)
 Your evaluated in the ED for a headache.  Your respiratory panel was negative.  Please follow-up with your PCP.  Eat plenty of rest and stay hydrated.

## 2023-11-12 NOTE — ED Notes (Signed)
 See triage notes. Patient c/o head pressure for the past three days.

## 2023-11-12 NOTE — ED Provider Notes (Signed)
 [facility] Emergency Department Provider Note     Event Date/Time   First MD Initiated Contact with Patient 11/12/23 1608     (approximate)   History   Headache (X 3 days)   HPI  Timothy [name] is a 28 y.o. male presents to the ED for evaluation of a headache described as pressure like a tightening band x 4 days.  Associated symptoms includes 1 episode of auras described as "flashing lights" and a non-productive cough that has been ongoing for 1 week.  Patient has tried Tylenol with minimal relief.  Patient reports a history of headaches, but is normally resolved with Tylenol.  Patient reports he cannot take pill medication as he has trouble taking this form of medication since he was 28 years old.  Denies visual changes, extremity weakness, fever, nausea and vomiting, chest pain or shortness of breath.     Physical Exam   Triage Vital Signs: ED Triage Vitals  Encounter Vitals Group     BP 11/12/23 1547 122/89     Systolic BP Percentile --      Diastolic BP Percentile --      Pulse Rate 11/12/23 1547 80     Resp 11/12/23 1547 18     Temp 11/12/23 1547 98.8 F (37.1 C)     Temp Source 11/12/23 1547 Oral     SpO2 11/12/23 1547 95 %     Weight 11/12/23 1545 268 lb 15.4 oz (122 kg)     Height 11/12/23 1545 6' (1.829 m)     Head Circumference --      Peak Flow --      Pain Score 11/12/23 1545 4     Pain Loc --      Pain Education --      Exclude from Growth Chart --     Most recent vital signs: Vitals:   11/12/23 1547  BP: 122/89  Pulse: 80  Resp: 18  Temp: 98.8 F (37.1 C)  SpO2: 95%    General: Well appearing. Alert and oriented. INAD.  Head:  NCAT.  Eyes:  PERRLA. EOMI.  Neck:   No cervical spine tenderness to palpation. Full ROM without difficulty.  CV:  Good peripheral perfusion. RRR.   RESP:  Normal effort. LCTAB.  NEURO: Cranial nerves II-XII intact. No focal deficits. Sensation and motor function intact. 5/5 muscle strength  of UE & LE. Gait is steady.   ED Results / Procedures / Treatments   Labs (all labs ordered are listed, but only abnormal results are displayed) Labs Reviewed  RESP PANEL BY RT-PCR (RSV, FLU A&B, COVID)  RVPGX2   No results found.  PROCEDURES:  Critical Care performed: No  Procedures   MEDICATIONS ORDERED IN ED: Medications  acetaminophen (TYLENOL) tablet 650 mg (650 mg Oral Patient Refused/Not Given 11/12/23 1712)  ketorolac (TORADOL) 30 MG/ML injection 30 mg (30 mg Intramuscular Given 11/12/23 1715)  diphenhydrAMINE (BENADRYL) injection 50 mg (50 mg Intramuscular Given 11/12/23 1713)  ondansetron (ZOFRAN-ODT) disintegrating tablet 4 mg (4 mg Oral Given 11/12/23 1712)     IMPRESSION / MDM / ASSESSMENT AND PLAN / ED COURSE  I reviewed the triage vital signs and the nursing notes.                               28 y.o. male presents to the emergency department for evaluation and treatment of acute headache. See  HPI for further details.   Differential diagnosis includes, but is not limited to tension headache, intracranial pathology, migraine  Patient's presentation is most consistent with acute complicated illness / injury requiring diagnostic workup.  Chart reviewed.  Patient seen 03/12 at [facility] ED for migraine.  Patient is alert and oriented.  He is hemodynamically stable.  Given chart review I did offer patient a head CT in which she kindly declined.  Migraine cocktail without IV fluids provided to the patient.  He did report improvement in the symptoms.  Physical exam findings are as stated above and overall reassuring.  No neurodeficits.  I do believe presentation is clinically consistent with a tension headache.  I encouraged him to follow-up with his primary care for further evaluation of his headache.  I do believe patient is in stable condition for discharge home.  ED return precautions discussed.   FINAL CLINICAL IMPRESSION(S) / ED DIAGNOSES   Final diagnoses:   Tension headache   Rx / DC Orders   ED Discharge Orders     None        Note:  This document was prepared using Dragon voice recognition software and may include unintentional dictation errors.    Romeo Apple, Delenn Ahn A, PA-C 11/12/23 2310    Jene Every, MD 11/20/23 [phone]

## 2023-11-14 ENCOUNTER — Other Ambulatory Visit: Payer: Self-pay

## 2023-11-14 ENCOUNTER — Other Ambulatory Visit: Payer: Self-pay | Admitting: Gastroenterology

## 2023-11-14 MED ORDER — OMEPRAZOLE 2 MG/ML ORAL SUSPENSION: 40.0000 mg | Freq: Two times a day (BID) | ORAL | 1 refills | Status: DC

## 2023-11-15 ENCOUNTER — Encounter: Payer: Self-pay | Admitting: Emergency Medicine

## 2023-11-15 ENCOUNTER — Emergency Department
Admission: EM | Admit: 2023-11-15 | Discharge: 2023-11-15 | Disposition: A | Discharge status: Home / Self Care | Attending: Emergency Medicine | Admitting: Emergency Medicine

## 2023-11-15 ENCOUNTER — Other Ambulatory Visit: Payer: Self-pay

## 2023-11-15 ENCOUNTER — Emergency Department

## 2023-11-15 DIAGNOSIS — Z85038 Personal history of other malignant neoplasm of large intestine: Secondary | ICD-10-CM | POA: Diagnosis not present

## 2023-11-15 DIAGNOSIS — G43009 Migraine without aura, not intractable, without status migrainosus: Secondary | ICD-10-CM | POA: Diagnosis not present

## 2023-11-15 DIAGNOSIS — G43909 Migraine, unspecified, not intractable, without status migrainosus: Secondary | ICD-10-CM | POA: Insufficient documentation

## 2023-11-15 DIAGNOSIS — R519 Headache, unspecified: Secondary | ICD-10-CM | POA: Diagnosis not present

## 2023-11-15 DIAGNOSIS — G8929 Other chronic pain: Secondary | ICD-10-CM | POA: Diagnosis not present

## 2023-11-15 LAB — COMPREHENSIVE METABOLIC PANEL
ALT: 29 U/L (ref 0–44)
AST: 29 U/L (ref 15–41)
Albumin: 4.4 g/dL (ref 3.5–5.0)
Alkaline Phosphatase: 56 U/L (ref 38–126)
Anion gap: 7 (ref 5–15)
BUN: 11 mg/dL (ref 6–20)
CO2: 24 mmol/L (ref 22–32)
Calcium: 9.1 mg/dL (ref 8.9–10.3)
Chloride: 105 mmol/L (ref 98–111)
Creatinine, Ser: 0.86 mg/dL (ref 0.61–1.24)
GFR, Estimated: >60 mL/min (ref >60)
Glucose, Bld: 87 mg/dL (ref 70–99)
Potassium: 4 mmol/L (ref 3.5–5.1)
Sodium: 136 mmol/L (ref 135–145)
Total Bilirubin: 0.8 mg/dL (ref 0.0–1.2)
Total Protein: 7.9 g/dL (ref 6.5–8.1)

## 2023-11-15 LAB — URINALYSIS, ROUTINE W REFLEX MICROSCOPIC
Bilirubin Urine: NEGATIVE
Glucose, UA: NEGATIVE mg/dL
Hgb urine dipstick: NEGATIVE
Ketones, ur: NEGATIVE mg/dL
Leukocytes,Ua: NEGATIVE
Nitrite: NEGATIVE
Protein, ur: NEGATIVE mg/dL
Specific Gravity, Urine: 1.017 (ref 1.005–1.030)
pH: 5 (ref 5.0–8.0)

## 2023-11-15 LAB — CBC
HCT: 44 % (ref 39.0–52.0)
Hemoglobin: 15.2 g/dL (ref 13.0–17.0)
MCH: 30.4 pg (ref 26.0–34.0)
MCHC: 34.5 g/dL (ref 30.0–36.0)
MCV: 88 fL (ref 80.0–100.0)
Platelets: 248 10*3/uL (ref 150–400)
RBC: 5 MIL/uL (ref 4.22–5.81)
RDW: 12.8 % (ref 11.5–15.5)
WBC: 7.1 10*3/uL (ref 4.0–10.5)
nRBC: 0 % (ref 0.0–0.2)

## 2023-11-15 LAB — LIPASE, BLOOD: Lipase: 31 U/L (ref 11–51)

## 2023-11-15 MED ORDER — SUMATRIPTAN SUCCINATE 50 MG PO TABS: 50.0000 mg | ORAL_TABLET | Freq: Once | ORAL | 2 refills | Status: DC | PRN

## 2023-11-15 MED ORDER — ONDANSETRON 4 MG PO TBDP: 4.0000 mg | ORAL_TABLET | Freq: Three times a day (TID) | ORAL | 0 refills | Status: DC | PRN

## 2023-11-15 MED ORDER — MELOXICAM 7.5 MG/5ML PO SUSP: 7.5000 mg | Freq: Every day | ORAL | 0 refills | Status: DC

## 2023-11-15 MED ORDER — SUMATRIPTAN 20 MG/ACT NA SOLN: 1.0000 | NASAL | 2 refills | Status: DC | PRN

## 2023-11-15 MED ORDER — KETOROLAC TROMETHAMINE 30 MG/ML IJ SOLN
60.0000 mg | Freq: Once | INTRAMUSCULAR | Status: AC
  Administered 2023-11-15: 60 mg via INTRAMUSCULAR
  Filled 2023-11-15: qty 2

## 2023-11-15 NOTE — ED Notes (Signed)
 See triage notes. Patient c/o headache.

## 2023-11-15 NOTE — ED Triage Notes (Signed)
 PT presents with worsening headache was seen a few days ago for same today started to throw up multiple times. While working today started to get lightheaded and SOB.

## 2023-11-15 NOTE — ED Triage Notes (Signed)
 Pt also feels like he has a pinched nerve in neck.

## 2023-11-15 NOTE — ED Triage Notes (Signed)
 Pt started on prozac about a month ago and didn't take it one day and noticed he didn't have a headache.

## 2023-11-15 NOTE — ED Provider Notes (Signed)
 [facility] Provider Note    Event Date/Time   First MD Initiated Contact with Patient 11/15/23 1555    (approximate)   History   Headache and Emesis   HPI  Timothy [name] is a 28 y.o. male who presents today with history of headache in the last 3 months, patient was seeing in November 12, 2023 and he was diagnosed with tension headache.  Patient is taking Tylenol without resolution.  Patient states having vomiting today, headache localized in the right frontal area, radiates bands like type.  Patient denies blurry vision, cough, fever, urinary symptoms.  Patient states nasal congestion.  Patient has history of colon cancer at the age of 29 with removal of sigmoid colon.     Physical Exam   Triage Vital Signs: ED Triage Vitals  Encounter Vitals Group     BP 11/15/23 1234 132/88     Systolic BP Percentile --      Diastolic BP Percentile --      Pulse Rate 11/15/23 1234 81     Resp 11/15/23 1234 18     Temp 11/15/23 1234 98.7 F (37.1 C)     Temp src --      SpO2 11/15/23 1234 95 %     Weight 11/15/23 1242 268 lb 15.4 oz (122 kg)     Height 11/15/23 1242 6' (1.829 m)     Head Circumference --      Peak Flow --      Pain Score 11/15/23 1242 7     Pain Loc --      Pain Education --      Exclude from Growth Chart --     Most recent vital signs: Vitals:   11/15/23 1234 11/15/23 1655  BP: 132/88 130/85  Pulse: 81 80  Resp: 18 17  Temp: 98.7 F (37.1 C) 98.3 F (36.8 C)  SpO2: 95% 96%     Constitutional: Alert, NAD. Able to speak in complete sentences without cough or dyspnea  Eyes: Conjunctivae are normal PERRLA Head: Atraumatic.  Tender to palpation in the left temporal area. Nose: No congestion/rhinnorhea. Mouth/Throat: Mucous membranes are moist.   Neck: Painless ROM. Supple. No JVD, nodes, thyromegaly  Cardiovascular:   Good peripheral circulation.RRR no murmurs, gallops, rubs  Respiratory: Normal respiratory effort.  No retractions.  Clear to auscultation bilaterally without wheezing or crackles  Gastrointestinal: Soft and nontender.  Musculoskeletal:  no deformity Cervical spine: Tender to palpation in the spinal process.  Neurologic:  MAE spontaneously. No gross focal neurologic deficits are appreciated.  Skin:  Skin is warm, dry and intact. No rash noted. Psychiatric: Mood and affect are normal. Speech and behavior are normal.    ED Results / Procedures / Treatments   Labs (all labs ordered are listed, but only abnormal results are displayed) Labs Reviewed  URINALYSIS, ROUTINE W REFLEX MICROSCOPIC - Abnormal; Notable for the following components:      Result Value   Color, Urine YELLOW (*)    APPearance CLEAR (*)    All other components within normal limits  LIPASE, BLOOD  COMPREHENSIVE METABOLIC PANEL  CBC     EKG     RADIOLOGY I independently reviewed and interpreted imaging and agree with radiologists findings.      PROCEDURES:  Critical Care performed:   Procedures   MEDICATIONS ORDERED IN ED: Medications  ketorolac (TORADOL) 30 MG/ML injection 60 mg (60 mg Intramuscular Given 11/15/23 1652)   Clinical Course as of  11/15/23 1815  Wed Nov 15, 2023  1557 Urinalysis, Routine w reflex microscopic -Urine, Clean Catch(!) WNL [AE]  1558 Lipase, blood WNL [AE]  1558 Comprehensive metabolic panel WNL [AE]  1558 CBC WNL [AE]  1807 CT Head Wo Contrast Negative CTs of the head and cervical spine. [AE]    Clinical Course User Index [AE] Gladys Damme, PA-C    IMPRESSION / MDM / ASSESSMENT AND PLAN / ED COURSE  I reviewed the triage vital signs and the nursing notes.  Differential diagnosis includes, but is not limited to, migraine, sinus infection,Differential diagnosis includes, but is not limited to, intracranial hemorrhage, meningitis/encephalitis, previous head trauma, cavernous venous thrombosis, tension headache, temporal arteritis, migraine or migraine equivalent, idiopathic  intracranial hypertension, and non-specific headache.   Patient's presentation is most consistent with acute complicated illness / injury requiring diagnostic workup.   Patient's diagnosis is consistent with migraine. I independently reviewed and interpreted imaging and agree with radiologists findings ruling out fracture, intracranial hemorrhage, sinus infection. Labs are  reassuring within normal limits ruling out infection. I did review the patient's allergies and medications.admission patient received Toradol with improvement of symptoms. the patient is in stable and satisfactory condition for discharge home  Patient will be discharged home with prescriptions for sumatriptan, Zofran. Patient is to follow up with neurology as needed or otherwise directed. Patient is given ED precautions to return to the ED for any worsening or new symptoms. Discussed plan of care with patient, answered all of patient's questions, Patient agreeable to plan of care. Advised patient to take medications according to the instructions on the label. Discussed possible side effects of new medications. Patient verbalized understanding.    FINAL CLINICAL IMPRESSION(S) / ED DIAGNOSES   Final diagnoses:  Migraine without aura and without status migrainosus, not intractable     Rx / DC Orders   ED Discharge Orders          Ordered    SUMAtriptan (IMITREX) 50 MG tablet  Once PRN        11/15/23 1811    ondansetron (ZOFRAN-ODT) 4 MG disintegrating tablet  Every 8 hours PRN        11/15/23 1811             Note:  This document was prepared using Dragon voice recognition software and may include unintentional dictation errors.   Gladys Damme, PA-C 11/15/23 1829    Minna Antis, MD 11/15/23 2314

## 2023-11-15 NOTE — Discharge Instructions (Addendum)
 You have been diagnosed with migraine, please take sumatriptan 1 tablet by mouth for migraine, you may repeat the tablet 2 hours if headache persists or recurs.  Please take Zofran 20 minutes before main meals every 8 hours for vomit.  Please drink plenty fluids.  Please make an appointment with neurology for a follow-up.  Come back to ED or go to your PCP if you have new symptoms or symptoms worsen.

## 2023-11-18 DIAGNOSIS — E785 Hyperlipidemia, unspecified: Secondary | ICD-10-CM | POA: Diagnosis not present

## 2023-11-18 DIAGNOSIS — F17210 Nicotine dependence, cigarettes, uncomplicated: Secondary | ICD-10-CM | POA: Diagnosis not present

## 2023-11-18 DIAGNOSIS — R0789 Other chest pain: Secondary | ICD-10-CM | POA: Diagnosis not present

## 2023-11-18 DIAGNOSIS — F419 Anxiety disorder, unspecified: Secondary | ICD-10-CM | POA: Diagnosis not present

## 2023-11-18 DIAGNOSIS — F064 Anxiety disorder due to known physiological condition: Secondary | ICD-10-CM | POA: Diagnosis not present

## 2023-11-18 DIAGNOSIS — R0602 Shortness of breath: Secondary | ICD-10-CM | POA: Diagnosis not present

## 2023-11-18 DIAGNOSIS — R079 Chest pain, unspecified: Secondary | ICD-10-CM | POA: Diagnosis not present

## 2023-11-20 DIAGNOSIS — F17210 Nicotine dependence, cigarettes, uncomplicated: Secondary | ICD-10-CM | POA: Diagnosis not present

## 2023-11-20 DIAGNOSIS — R1084 Generalized abdominal pain: Secondary | ICD-10-CM | POA: Diagnosis not present

## 2023-11-20 DIAGNOSIS — F419 Anxiety disorder, unspecified: Secondary | ICD-10-CM | POA: Diagnosis not present

## 2023-11-20 DIAGNOSIS — R079 Chest pain, unspecified: Secondary | ICD-10-CM | POA: Diagnosis not present

## 2023-11-20 DIAGNOSIS — E785 Hyperlipidemia, unspecified: Secondary | ICD-10-CM | POA: Diagnosis not present

## 2023-11-21 ENCOUNTER — Other Ambulatory Visit: Payer: Self-pay

## 2023-11-21 ENCOUNTER — Encounter: Payer: Self-pay | Admitting: Internal Medicine

## 2023-11-21 ENCOUNTER — Emergency Department
Admission: EM | Admit: 2023-11-21 | Discharge: 2023-11-22 | Disposition: A | Discharge status: Home / Self Care | Attending: Emergency Medicine | Admitting: Emergency Medicine

## 2023-11-21 ENCOUNTER — Encounter: Payer: Self-pay | Admitting: *Deleted

## 2023-11-21 ENCOUNTER — Emergency Department

## 2023-11-21 DIAGNOSIS — R079 Chest pain, unspecified: Secondary | ICD-10-CM | POA: Diagnosis not present

## 2023-11-21 DIAGNOSIS — R1084 Generalized abdominal pain: Secondary | ICD-10-CM | POA: Diagnosis not present

## 2023-11-21 DIAGNOSIS — F32A Depression, unspecified: Secondary | ICD-10-CM | POA: Diagnosis not present

## 2023-11-21 DIAGNOSIS — G4489 Other headache syndrome: Secondary | ICD-10-CM | POA: Diagnosis not present

## 2023-11-21 DIAGNOSIS — R519 Headache, unspecified: Secondary | ICD-10-CM | POA: Diagnosis not present

## 2023-11-21 DIAGNOSIS — I10 Essential (primary) hypertension: Secondary | ICD-10-CM | POA: Diagnosis not present

## 2023-11-21 DIAGNOSIS — F419 Anxiety disorder, unspecified: Secondary | ICD-10-CM | POA: Diagnosis present

## 2023-11-21 DIAGNOSIS — F418 Other specified anxiety disorders: Secondary | ICD-10-CM | POA: Diagnosis not present

## 2023-11-21 DIAGNOSIS — R0789 Other chest pain: Secondary | ICD-10-CM | POA: Diagnosis not present

## 2023-11-21 DIAGNOSIS — R109 Unspecified abdominal pain: Secondary | ICD-10-CM | POA: Diagnosis not present

## 2023-11-21 LAB — BASIC METABOLIC PANEL
Anion gap: 7 (ref 5–15)
BUN: 10 mg/dL (ref 6–20)
CO2: 26 mmol/L (ref 22–32)
Calcium: 9.4 mg/dL (ref 8.9–10.3)
Chloride: 107 mmol/L (ref 98–111)
Creatinine, Ser: 0.89 mg/dL (ref 0.61–1.24)
GFR, Estimated: >60 mL/min (ref >60)
Glucose, Bld: 102 mg/dL — ABNORMAL HIGH (ref 70–99)
Potassium: 3.8 mmol/L (ref 3.5–5.1)
Sodium: 140 mmol/L (ref 135–145)

## 2023-11-21 LAB — CBC
HCT: 44.5 % (ref 39.0–52.0)
Hemoglobin: 15.6 g/dL (ref 13.0–17.0)
MCH: 31 pg (ref 26.0–34.0)
MCHC: 35.1 g/dL (ref 30.0–36.0)
MCV: 88.5 fL (ref 80.0–100.0)
Platelets: 224 10*3/uL (ref 150–400)
RBC: 5.03 MIL/uL (ref 4.22–5.81)
RDW: 12.7 % (ref 11.5–15.5)
WBC: 9.2 10*3/uL (ref 4.0–10.5)
nRBC: 0 % (ref 0.0–0.2)

## 2023-11-21 LAB — TROPONIN I (HIGH SENSITIVITY): Troponin I (High Sensitivity): 4 ng/L (ref <18)

## 2023-11-21 NOTE — ED Provider Notes (Signed)
 [facility] Provider Note    Event Date/Time   First MD Initiated Contact with Patient 11/21/23 2352     (approximate)   History   Chest Pain   HPI  [place] T Iafrate is a 28 y.o. male brought to the ED via EMS from local hotel with a chief complaint of chest pain, headache, anxiety/depression.  History of same with frequent ED visits, followed by cardiology as well as PCP, GI and psychiatry.  This is the patient's 14th visit to an emergency department since the beginning of this year, second visit in the past 24 hours.  He was last prescribed Lorazepam for home use but did not fill it.  He is on 10 mg fluoxetine daily and has been for the past 2 months.  He is "self experimenting" with the medication.  Feels dissatisfied with all of his care providers.  Denies fever/chills, shortness of breath, abdominal pain, nausea/vomiting or dizziness.  Denies active SI/HI/AH/VH although patient does endorse ongoing depression.     Past Medical History   Past Medical History:  Diagnosis Date   Allergy    Anxiety    Depression    Diverticulitis    Dyspnea    Fainting spell    in middle school   Family history of bone cancer    Family history of breast cancer    Hyperlipidemia      Active Problem List   Patient Active Problem List   Diagnosis Date Noted   Hypertriglyceridemia 06/23/2022   Insomnia 06/23/2022   Gastroesophageal reflux disease without esophagitis 01/25/2022   Class 2 obesity due to excess calories with body mass index (BMI) of 35.0 to 35.9 in adult 03/29/2021   Anxiety and depression 07/28/2017     Past Surgical History   Past Surgical History:  Procedure Laterality Date   COLECTOMY WITH COLOSTOMY CREATION/HARTMANN PROCEDURE N/A 07/22/2018   Procedure: COLECTOMY WITH COLOSTOMY CREATION/HARTMANN PROCEDURE;  Surgeon: Duanne Guess, MD;  Location: [facility] ORS;  Service: General;  Laterality: N/A;   COLONOSCOPY WITH PROPOFOL N/A 11/01/2018    Procedure: COLONOSCOPY WITH PROPOFOL;  Surgeon: Wyline Mood, MD;  Location: [facility] ENDOSCOPY;  Service: Gastroenterology;  Laterality: N/A;   COLONOSCOPY WITH PROPOFOL N/A 05/26/2022   Procedure: COLONOSCOPY WITH PROPOFOL;  Surgeon: Wyline Mood, MD;  Location: [facility] ENDOSCOPY;  Service: Gastroenterology;  Laterality: N/A;   COLOSTOMY REVERSAL N/A 03/22/2019   Procedure: COLOSTOMY REVERSAL;  Surgeon: Duanne Guess, MD;  Location: [facility] ORS;  Service: General;  Laterality: N/A;   ESOPHAGOGASTRODUODENOSCOPY N/A 05/26/2022   Procedure: ESOPHAGOGASTRODUODENOSCOPY (EGD);  Surgeon: Wyline Mood, MD;  Location: [facility] ENDOSCOPY;  Service: Gastroenterology;  Laterality: N/A;     Home Medications   Prior to Admission medications   Medication Sig Start Date End Date Taking? Authorizing Provider  cyclobenzaprine (FLEXERIL) 5 MG tablet Take 1 tablet (5 mg total) by mouth 3 (three) times daily as needed for muscle spasms. 10/30/23   Loleta Rose, MD  FLUoxetine (PROZAC) 20 MG/5ML solution Take 2.5 mLs (10 mg total) by mouth daily. 10/10/23   Lorre Munroe, NP  hydrOXYzine (ATARAX) 10 MG/5ML syrup Take 5 mLs (10 mg total) by mouth 3 (three) times daily as needed. Patient not taking: Reported on 10/16/2023 10/10/23 01/08/24  Lorre Munroe, NP  Meloxicam 7.5 MG/5ML SUSP Take 5 mLs (7.5 mg total) by mouth daily. 11/15/23   Gladys Damme, PA-C  omeprazole (KONVOMEP) 2 mg/mL SUSP oral suspension Take 20 mLs (40 mg total) by mouth 2 (  two) times daily before a meal. 11/14/23   Wyline Mood, MD  ondansetron (ZOFRAN-ODT) 4 MG disintegrating tablet Take 1 tablet (4 mg total) by mouth every 8 (eight) hours as needed for nausea or vomiting. 11/15/23   Gladys Damme, PA-C  SUMAtriptan (IMITREX) 20 MG/ACT nasal spray Place 1 spray (20 mg total) into the nose as needed for migraine or headache. May repeat in 2 hours if headache persists or recurs. 11/15/23 11/14/24  Gladys Damme, PA-C     Allergies  Patient has no known  allergies.   Family History   Family History  Problem Relation Age of Onset   Alcohol abuse Mother    Depression Mother    Cervical cancer Mother    Depression Father    Anxiety disorder Father    Healthy Sister    Alport syndrome Maternal Aunt    Heart disease Maternal Grandmother    Diabetes Maternal Grandmother    Cancer Maternal Grandmother        breast cancer   Alport syndrome Maternal Grandmother    Cancer Paternal Grandfather        Bone cancer     Physical Exam  Triage Vital Signs: ED Triage Vitals  Encounter Vitals Group     BP 11/21/23 2123 (!) 142/83     Systolic BP Percentile --      Diastolic BP Percentile --      Pulse Rate 11/21/23 2123 87     Resp 11/21/23 2123 18     Temp 11/21/23 2123 99 F (37.2 C)     Temp Source 11/21/23 2123 Oral     SpO2 11/21/23 2106 96 %     Weight 11/21/23 2123 267 lb (121.1 kg)     Height 11/21/23 2123 6' (1.829 m)     Head Circumference --      Peak Flow --      Pain Score 11/21/23 2123 10     Pain Loc --      Pain Education --      Exclude from Growth Chart --     Updated Vital Signs: BP 124/82   Pulse (!) 57   Temp 99 F (37.2 C) (Oral)   Resp 15   Ht 6' (1.829 m)   Wt 121.1 kg   SpO2 95%   BMI 36.21 kg/m    General: Awake, no distress.   CV:  RRR.  Good peripheral perfusion.  Resp:  Normal effort.  CTAB. Abd:  Nontender.  No distention.  Other:  No thyromegaly.  Anxious.   ED Results / Procedures / Treatments  Labs (all labs ordered are listed, but only abnormal results are displayed) Labs Reviewed  BASIC METABOLIC PANEL - Abnormal; Notable for the following components:      Result Value   Glucose, Bld 102 (*)    All other components within normal limits  CBC  TROPONIN I (HIGH SENSITIVITY)     EKG  ED ECG REPORT I, Matthe Sloane J, the attending physician, personally viewed and interpreted this ECG.   Date: 11/22/2023  EKG Time: 2126  Rate: 74  Rhythm: normal sinus rhythm  Axis:  Normal  Intervals:none  ST&T Change: Nonspecific    RADIOLOGY I have independently visualized and interpreted patient's imaging study as well as noted the radiology interpretation:  Chest x-ray: No acute cardiopulmonary process  Official radiology report(s): DG Chest 2 View Result Date: 11/21/2023 CLINICAL DATA:  Chest pain EXAM: CHEST - 2 VIEW COMPARISON:  09/12/2023 FINDINGS:  The heart size and mediastinal contours are within normal limits. Both lungs are clear. The visualized skeletal structures are unremarkable. IMPRESSION: No active cardiopulmonary disease. Electronically Signed   By: Alcide Clever M.D.   On: 11/21/2023 21:48     PROCEDURES:  Critical Care performed: No  Procedures   MEDICATIONS ORDERED IN ED: Medications  alum & mag hydroxide-simeth (MAALOX/MYLANTA) 200-200-20 MG/5ML suspension 30 mL (30 mLs Oral Given 11/22/23 0033)  lidocaine (XYLOCAINE) 2 % viscous mouth solution 15 mL (15 mLs Mouth/Throat Given 11/22/23 0035)  LORazepam (ATIVAN) injection 2 mg (2 mg Intramuscular Given 11/22/23 0040)     IMPRESSION / MDM / ASSESSMENT AND PLAN / ED COURSE  I reviewed the triage vital signs and the nursing notes.                             28 year old male presenting with chronic chest pain, headache, anxiety/depression. Differential diagnosis includes, but is not limited to, ACS, aortic dissection, pulmonary embolism, cardiac tamponade, pneumothorax, pneumonia, pericarditis, myocarditis, GI-related causes including esophagitis/gastritis, and musculoskeletal chest wall pain.   I have personally reviewed patient's records and note his ED visit from [facility] less than 24 hours ago with negative troponin.  Patient's presentation is most consistent with exacerbation of chronic illness.  The patient is on the cardiac monitor to evaluate for evidence of arrhythmia and/or significant heart rate changes.  Laboratory results unremarkable, repeat troponin not warranted as  patient has had several negative troponins.  Chest x-ray is clear.  Administer GI cocktail, IM Lorazepam, consult psychiatry for evaluation and medication recommendation.  Clinical Course as of 11/22/23 0652  Wed Nov 22, 2023  0651 No events overnight.  Patient remains in the emergency department pending psychiatric evaluation.  Care will be transferred to the oncoming provider at change of shift. [JS]    Clinical Course User Index [JS] Irean Hong, MD     FINAL CLINICAL IMPRESSION(S) / ED DIAGNOSES   Final diagnoses:  Nonspecific chest pain  Nonintractable headache, unspecified chronicity pattern, unspecified headache type  Anxiety  Depression, unspecified depression type     Rx / DC Orders   ED Discharge Orders     None        Note:  This document was prepared using Dragon voice recognition software and may include unintentional dictation errors.   Irean Hong, MD 11/22/23 [phone]

## 2023-11-21 NOTE — ED Triage Notes (Signed)
 Pt brought in via ems from a hotel.  Pt reports onset of a headache, chest pain.  Pt seen here for recently for similar sx.  No otc meds for headache.  Pt alert  speech clear.

## 2023-11-21 NOTE — ED Triage Notes (Signed)
 EMS brings pt in from home for c/o CP, HA; has been seen multiple times for same

## 2023-11-22 DIAGNOSIS — F419 Anxiety disorder, unspecified: Secondary | ICD-10-CM | POA: Diagnosis not present

## 2023-11-22 MED ORDER — ALUM & MAG HYDROXIDE-SIMETH 200-200-20 MG/5ML PO SUSP
30.0000 mL | Freq: Once | ORAL | Status: AC
Start: 2023-11-22 — End: 2023-11-22
  Administered 2023-11-22: 30 mL via ORAL
  Filled 2023-11-22: qty 30

## 2023-11-22 MED ORDER — LIDOCAINE VISCOUS HCL 2 % MT SOLN
15.0000 mL | Freq: Once | OROMUCOSAL | Status: AC
  Administered 2023-11-22: 15 mL via OROMUCOSAL
  Filled 2023-11-22: qty 15

## 2023-11-22 MED ORDER — LORAZEPAM 2 MG/ML IJ SOLN
2.0000 mg | Freq: Once | INTRAMUSCULAR | Status: AC
  Administered 2023-11-22: 2 mg via INTRAMUSCULAR
  Filled 2023-11-22: qty 1

## 2023-11-22 NOTE — BH Assessment (Addendum)
 Comprehensive Clinical Assessment (CCA) Note  11/22/2023 Timothy [name] [id number]  Chief Complaint: Patient is a 28 year old male presenting to [facility] ED voluntarily. Per triage note Pt brought in via ems from a hotel. Pt reports onset of a headache, chest pain. Pt seen here for recently for similar sx. No otc meds for headache. Pt alert speech clear. During assessment patient appears alert and oriented x4, calm and cooperative. Patient reports "It's been a battle trying to find something that works for my anxiety and depression." Patient reports that he is currently taking Prozac and that it is prescribed by his PCP, when asked if the patient has a psychiatrist the patient denies and reports "I'm supposed to be working on that." Patient also reports being prescribed Ativan by the ED but that he has not taken it, he reports "the Prozac was working then it started making me feel weird." Patient denies using any substances or alcohol, he reports his sleep is fair and at times waking up during the night. Patient denies SI/HI Chief Complaint  Patient presents with   Chest Pain   Visit Diagnosis: Anxiety. Depression    CCA Screening, Triage and Referral (STR)  Patient Reported Information How did you hear about [place]? Self  Referral name: No data recorded Referral phone number: No data recorded  Whom do you see for routine medical problems? No data recorded Practice/Facility Name: No data recorded Practice/Facility Phone Number: No data recorded Name of Contact: No data recorded Contact Number: No data recorded Contact Fax Number: No data recorded Prescriber Name: No data recorded Prescriber Address (if known): No data recorded  What Is the Reason for Your Visit/Call Today? Pt brought in via ems from a hotel.  Pt reports onset of a headache, chest pain.  Pt seen here for recently for similar sx.  No otc meds for headache.  Pt alert  speech clear.  How Long Has This Been Causing You Problems? >  than 6 months  What Do You Feel Would Help You the Most Today? Treatment for Depression or other mood problem   Have You Recently Been in Any Inpatient Treatment (Hospital/Detox/Crisis Center/28-Day Program)? No data recorded Name/Location of Program/Hospital:No data recorded How Long Were You There? No data recorded When Were You Discharged? No data recorded  Have You Ever Received Services From [facility] Before? No data recorded Who Do You See at [facility]? No data recorded  Have You Recently Had Any Thoughts About Hurting Yourself? No  Are You Planning to Commit Suicide/Harm Yourself At This time? No   Have you Recently Had Thoughts About Hurting Someone Timothy [name]? No  Explanation: No data recorded  Have You Used Any Alcohol or Drugs in the Past 24 Hours? No  How Long Ago Did You Use Drugs or Alcohol? No data recorded What Did You Use and How Much? No data recorded  Do You Currently Have a Therapist/Psychiatrist? No  Name of Therapist/Psychiatrist: No data recorded  Have You Been Recently Discharged From Any Office Practice or Programs? No  Explanation of Discharge From Practice/Program: No data recorded    CCA Screening Triage Referral Assessment Type of Contact: Face-to-Face  Is this Initial or Reassessment? No data recorded Date Telepsych consult ordered in [facility]:  No data recorded Time Telepsych consult ordered in [facility]:  No data recorded  Patient Reported Information Reviewed? No data recorded Patient Left Without Being Seen? No data recorded Reason for Not Completing Assessment: No data recorded  Collateral Involvement: No  data recorded  Does Patient Have a Court Appointed Legal Guardian? No data recorded Name and Contact of Legal Guardian: No data recorded If Minor and Not Living with Parent(s), Who has Custody? No data recorded Is CPS involved or ever been involved? Never  Is APS involved or ever been involved? Never   Patient Determined To Be At Risk  for Harm To Self or Others Based on Review of Patient Reported Information or Presenting Complaint? No  Method: No data recorded Availability of Means: No data recorded Intent: No data recorded Notification Required: No data recorded Additional Information for Danger to Others Potential: No data recorded Additional Comments for Danger to Others Potential: No data recorded Are There Guns or Other Weapons in Your Home? No  Types of Guns/Weapons: No data recorded Are These Weapons Safely Secured?                            No data recorded Who Could Verify You Are Able To Have These Secured: No data recorded Do You Have any Outstanding Charges, Pending Court Dates, Parole/Probation? No data recorded Contacted To Inform of Risk of Harm To Self or Others: No data recorded  Location of Assessment: [facility] ED   Does Patient Present under Involuntary Commitment? No  IVC Papers Initial File Date: No data recorded  [place] of Residence: [place]   Patient Currently Receiving the Following Services: No data recorded  Determination of Need: Emergent (2 hours)   Options For Referral: No data recorded    CCA Biopsychosocial Intake/Chief Complaint:  No data recorded Current Symptoms/Problems: No data recorded  Patient Reported Schizophrenia/Schizoaffective Diagnosis in Past: No   Strengths: Patient is able to communicate his needs; has employment  Preferences: No data recorded Abilities: No data recorded  Type of Services Patient Feels are Needed: No data recorded  Initial Clinical Notes/Concerns: No data recorded  Mental Health Symptoms Depression:  Change in energy/activity; Fatigue   Duration of Depressive symptoms: Greater than two weeks   Mania:  None   Anxiety:   Fatigue; Restlessness   Psychosis:  None   Duration of Psychotic symptoms: No data recorded  Trauma:  None   Obsessions:  None   Compulsions:  None   Inattention:  None   Hyperactivity/Impulsivity:   None   Oppositional/Defiant Behaviors:  None   Emotional Irregularity:  None   Other Mood/Personality Symptoms:  No data recorded   Mental Status Exam Appearance and self-care  Stature:  Tall   Weight:  Overweight   Clothing:  Casual   Grooming:  Normal   Cosmetic use:  None   Posture/gait:  Normal   Motor activity:  Not Remarkable   Sensorium  Attention:  Normal   Concentration:  Normal   Orientation:  X5   Recall/memory:  Normal   Affect and Mood  Affect:  Appropriate   Mood:  Other (Comment)   Relating  Eye contact:  Normal   Facial expression:  Responsive   Attitude toward examiner:  Cooperative   Thought and Language  Speech flow: Clear and Coherent   Thought content:  Appropriate to Mood and Circumstances   Preoccupation:  None   Hallucinations:  None   Organization:  No data recorded  [employer] of Knowledge:  Good   Intelligence:  Average   Abstraction:  Normal   Judgement:  Good   Reality Testing:  Realistic   Insight:  Good   Decision  Making:  Normal   Social Functioning  Social Maturity:  Responsible   Social Judgement:  Normal   Stress  Stressors:  Illness   Coping Ability:  Normal   Skill Deficits:  None   Supports:  Family     Religion: Religion/Spirituality Are You A Religious Person?: No  Leisure/Recreation: Leisure / Recreation Do You Have Hobbies?: No  Exercise/Diet: Exercise/Diet Do You Exercise?: No Have You Gained or Lost A Significant Amount of Weight in the Past Six Months?: No Do You Follow a Special Diet?: No Do You Have Any Trouble Sleeping?: No   CCA Employment/Education Employment/Work Situation: Employment / Work Situation Employment Situation: Employed Work Stressors: None reported Patient's Job has Been Impacted by Current Illness: No Has Patient ever Been in Equities trader?: No  Education: Education Is Patient Currently Attending School?: No Did You Have An  Individualized Education Program (IIEP): No Did You Have Any Difficulty At [employer]?: No Patient's Education Has Been Impacted by Current Illness: No   CCA Family/Childhood History Family and Relationship History: Family history Marital status: Single Does patient have children?: No  Childhood History:  Childhood History Did patient suffer any verbal/emotional/physical/sexual abuse as a child?: No Did patient suffer from severe childhood neglect?: No Has patient ever been sexually abused/assaulted/raped as an adolescent or adult?: No Was the patient ever a victim of a crime or a disaster?: No Witnessed domestic violence?: No Has patient been affected by domestic violence as an adult?: No  Child/Adolescent Assessment:     CCA Substance Use Alcohol/Drug Use: Alcohol / Drug Use Pain Medications: see mar Prescriptions: see mar Over the Counter: see mar History of alcohol / drug use?: No history of alcohol / drug abuse                         ASAM's:  Six Dimensions of Multidimensional Assessment  Dimension 1:  Acute Intoxication and/or Withdrawal Potential:      Dimension 2:  Biomedical Conditions and Complications:      Dimension 3:  Emotional, Behavioral, or Cognitive Conditions and Complications:     Dimension 4:  Readiness to Change:     Dimension 5:  Relapse, Continued use, or Continued Problem Potential:     Dimension 6:  Recovery/Living Environment:     ASAM Severity Score:    ASAM Recommended Level of Treatment:     Substance use Disorder (SUD)    Recommendations for Services/Supports/Treatments:    DSM5 Diagnoses: Patient Active Problem List   Diagnosis Date Noted   Hypertriglyceridemia 06/23/2022   Insomnia 06/23/2022   Gastroesophageal reflux disease without esophagitis 01/25/2022   Class 2 obesity due to excess calories with body mass index (BMI) of 35.0 to 35.9 in adult 03/29/2021   Anxiety and depression 07/28/2017    Patient Centered  Plan: Patient is on the following Treatment Plan(s):  Anxiety and Depression   Referrals to Alternative Service(s): Referred to Alternative Service(s):   Place:   Date:   Time:    Referred to Alternative Service(s):   Place:   Date:   Time:    Referred to Alternative Service(s):   Place:   Date:   Time:    Referred to Alternative Service(s):   Place:   Date:   Time:      @[facility]LL[facility]FC[facility] @  [employer], LCAS-A

## 2023-11-22 NOTE — ED Notes (Signed)
 Pt states that he would like to be admitted and observed for a couple of days just to be sure but since this was not done he states that he "will just be back for the chest pain"

## 2023-11-22 NOTE — ED Notes (Signed)
 Pt was calling out because monitor was beeping. Asking about VS. VS are normal. Reassured pt that VS are normal. Provided urinal.

## 2023-11-22 NOTE — Consult Note (Signed)
 [facility] Psychiatric Consult Initial  Patient Name: .Timothy [name]  MRN: [id number]  DOB: August 28, 1996  Consult Order details:  Orders (From admission, onward)     Start     Ordered   11/22/23 0014  CONSULT TO CALL ACT TEAM       Ordering Provider: Irean Hong, MD  Provider:  (Not yet assigned)  Question:  Reason for Consult?  Answer:  severe anxiety on 10mg  Fluoxetine, appreciate eval w/ med rec   11/22/23 0014   11/22/23 0014  IP CONSULT TO PSYCHIATRY       Ordering Provider: Irean Hong, MD  Provider:  (Not yet assigned)  Question Answer Comment  Place call to: psychiatry   Reason for Consult Consult   Diagnosis/Clinical Info for Consult: severe anxiety, depression on 10mg  Fluoxetine, appreciate med rec      11/22/23 0014             Mode of Visit: Tele-visit Virtual Statement:TELE PSYCHIATRY ATTESTATION & CONSENT As the provider for this telehealth consult, I attest that I verified the patient's identity using two separate identifiers, introduced myself to the patient, provided my credentials, disclosed my location, and performed this encounter via a HIPAA-compliant, real-time, face-to-face, two-way, interactive audio and video platform and with the full consent and agreement of the patient (or guardian as applicable.) Patient physical location: [facility] ED. Telehealth provider physical location: home office in state of [place].   Video start time: 11:30 AM Video end time: 12:22 PM    Psychiatry Consult Evaluation  Service Date: November 22, 2023 LOS:  LOS: 0 days  Chief Complaint "I've been having headaches and severe chest pains. Last night, my neck started feeling stiff and my right side of my face and neck felt paralyzed. I get upset and anxious when I have these symptoms and come to the hospital and they can't find what's wrong".  Primary Psychiatric Diagnoses  Anxiety   Assessment  Timothy [name] is a 28 y.o. male presented to [facility] ED on 11/21/23 at 11:47 PM due to  experiencing a headache, chest pain, and feelings of right sided facial paralysis last night. He reports that he first experienced headaches in December 2024. He reports that headaches became more severe, starting 1 week after being prescribed Prozac to manage his hx of anxiety and depression. Patient reported that after taking Prozac consistently for extended period of time, his headaches were less severe.  He reports medication compliance. He reports medication effectiveness at his current dosage. Patient also has a psychiatric hx of insomnia. He reports experiencing anxiety and depression "all my life" but officially being diagnosed with anxiety and depression in approx. 2022 or 2023.  He reports being previously prescribed Lexapro but it was ineffective and switching to Prozac 10 mg in January 2025, under the care of his PCP, [name] of [facility].  Patient reports a desire to establish outpatient services with a therapist in the community because "I need somebody to talk to, somebody to listen to me". He denies suicidal or homicidal ideations, paranoia, delusional thought or auditory or visual hallucinations. He has a medical history of GERD and hypertriglyceridemia.    Diagnoses:  Active Hospital problems: Active Problems:   Anxiety    Plan   ## Psychiatric Medication Recommendations:  No medication adjustments recommended.  Continue Prozac 10 mg daily  ## Medical Decision Making Capacity: Not specifically addressed in this encounter  ## Further Work-up:  -- Deferred to EPP  --  most recent EKG on 11/22/2023 had QtC of 386 -- Pertinent labwork reviewed earlier this admission includes: CMP, CBC, EKG   ## Disposition:--  There are no psychiatric contraindications to discharge at this time ## Behavioral / Environmental: - No specific recommendations at this time.     ## Safety and Observation Level:  - Based on my clinical evaluation, I estimate the patient to be at  low risk of self harm in the current setting. - At this time, we recommend routine. This decision is based on my review of the chart including patient's history and current presentation, interview of the patient, mental status examination, and consideration of suicide risk including evaluating suicidal ideation, plan, intent, suicidal or self-harm behaviors, risk factors, and protective factors. This judgment is based on our ability to directly address suicide risk, implement suicide prevention strategies, and develop a safety plan while the patient is in the clinical setting. Please contact our team if there is a concern that risk level has changed.  CSSR Risk Category:C-SSRS RISK CATEGORY: No Risk  Suicide Risk Assessment: Patient has following modifiable risk factors for suicide: lack of access to outpatient mental health resources, which we are addressing by providing community resources for patient to establish a outpatient therapist. Patient has following non-modifiable or demographic risk factors for suicide: male gender Patient has the following protective factors against suicide: Supportive family, Cultural, spiritual, or religious beliefs that discourage suicide, no history of suicide attempts, and no history of NSSIB  Thank you for this consult request. Recommendations have been communicated to the primary team.  We will psychiatrically clear patient for discharge once he is medically cleared at this time.   Mcneil Sober, NP       History of Present Illness  Relevant Aspects of Hospital ED Course:  Admitted on 11/21/2023 for "headache and chest pain".   Patient Report:  "I get upset and anxious when I have these symptoms and come to the hospital and they can't find what's wrong."  Psych ROS:  Depression: yes. Hx of MDD Anxiety:  yes. Hx of GAD Mania (lifetime and current): denies Psychosis: (lifetime and current): denies    Review of Systems  Psychiatric/Behavioral:  Negative  for depression, hallucinations, memory loss, substance abuse and suicidal ideas. The patient does not have insomnia.   All other systems reviewed and are negative.    Psychiatric and Social History  Psychiatric History:  Information collected from patient and ED treatment team  Prev Dx/Sx: MDD, GAD, insomnia Current Psych Provider: denies.  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 "My mom is anti-guns"   Substance History Alcohol: denies current use. Reports drinking a beer 4 months ago  Type of alcohol beer Last Drink 4 months ago Number of drinks per day 1 History of alcohol withdrawal seizures denies History of DT's denies Tobacco: vape nicotine Illicit drugs: denies current use. Reports smoking marijuana last in 2019 and drinking a THC slushy in February 2025. He reports trying the slushy to manage his headache, angina and anxiety. Prescription drug abuse: denies Rehab hx:  denies  Exam Findings  Physical Exam: no abnormal movements observed Vital Signs:  Temp:  [98.2 F (36.8 C)-99 F (37.2 C)] 98.2 F (36.8 C) (03/26 1131) Pulse Rate:  [55-88] 88 (03/26 1131) Resp:  [13-19] 14 (03/26 1131) BP: (109-142)/(67-90) 125/78 (03/26 1131) SpO2:  [93 %-99 %] 99 % (03/26 1131) Weight:  [121.1 kg] 121.1 kg (03/25 2123) Blood pressure 125/78, pulse 88, temperature 98.2 F (36.8 C), temperature source Oral, resp. rate 14, height 6' (1.829 m), weight 121.1 kg, SpO2 99%. Body  mass index is 36.21 kg/m.  Physical Exam Vitals and nursing note reviewed.  Constitutional:      Appearance: He is well-developed.  Neurological:     General: No focal deficit present.     Mental Status: He is alert and oriented to person, place, and time.  Psychiatric:        Mood and Affect: Mood normal.        Behavior: Behavior normal.     Mental Status Exam: General Appearance: Fairly Groomed  Orientation:  Full (Time, Place, and Person)  Memory:  Immediate;   Good Recent;   Good Remote;   Good  Concentration:  Concentration: Good  Recall:  Good  Attention  Good  Eye Contact:  Good  Speech:  Clear and Coherent  Language:  Good  Volume:  Normal  Mood: good  Affect:  Congruent  Thought Process:  Coherent  Thought Content:  Logical  Suicidal Thoughts:  No  Homicidal Thoughts:  No  Judgement:  Good  Insight:  Good  Psychomotor Activity:  Normal  Akathisia:  No  Fund of Knowledge:  Good      Assets:  Communication Skills Desire for Improvement Housing Social Support  Cognition:  WNL  ADL's:  Intact  AIMS (if indicated):        Other History   These have been pulled in through the EMR, reviewed, and updated if appropriate.  Family History:  The patient's family history includes Alcohol abuse in his mother; Alport syndrome in his maternal aunt and maternal grandmother; Anxiety disorder in his father; Cancer in his maternal grandmother and paternal grandfather; Cervical cancer in his mother; Depression in his father and mother; Diabetes in his maternal grandmother; Healthy in his sister; Heart disease in his maternal grandmother.  Medical History: Past Medical History:  Diagnosis Date   Allergy    Anxiety    Depression    Diverticulitis    Dyspnea    Fainting spell    in middle school   Family history of bone cancer    Family history of breast cancer    Hyperlipidemia     Surgical History: Past Surgical History:  Procedure Laterality Date    COLECTOMY WITH COLOSTOMY CREATION/HARTMANN PROCEDURE N/A 07/22/2018   Procedure: COLECTOMY WITH COLOSTOMY CREATION/HARTMANN PROCEDURE;  Surgeon: Duanne Guess, MD;  Location: [facility] ORS;  Service: General;  Laterality: N/A;   COLONOSCOPY WITH PROPOFOL N/A 11/01/2018   Procedure: COLONOSCOPY WITH PROPOFOL;  Surgeon: Wyline Mood, MD;  Location: [facility] ENDOSCOPY;  Service: Gastroenterology;  Laterality: N/A;   COLONOSCOPY WITH PROPOFOL N/A 05/26/2022   Procedure: COLONOSCOPY WITH PROPOFOL;  Surgeon: Wyline Mood, MD;  Location[facility] ENDOSCOPY;  Service: Gastroenterology;  Laterality: N/A;   COLOSTOMY REVERSAL N/A 03/22/2019   Procedure: COLOSTOMY REVERSAL;  Surgeon: Duanne Guess, MD;  Location: [facility] ORS;  Service: General;  Laterality: N/A;   ESOPHAGOGASTRODUODENOSCOPY N/A 05/26/2022   Procedure: ESOPHAGOGASTRODUODENOSCOPY (EGD);  Surgeon: Wyline Mood, MD;  Location[facility] ENDOSCOPY;  Service: Gastroenterology;  Laterality: N/A;     Medications:  No current facility-administered medications for this encounter.  Current Outpatient Medications:    cyclobenzaprine (FLEXERIL) 5 MG tablet, Take 1 tablet (5 mg total) by mouth 3 (three) times daily as needed for muscle spasms., Disp: 30 tablet, Rfl: 0   FLUoxetine (PROZAC) 20 MG/5ML solution, Take 2.5 mLs (10 mg total) by mouth daily., Disp: 450 mL, Rfl: 1   hydrOXYzine (ATARAX) 10 MG/5ML syrup, Take 5 mLs (10 mg total) by mouth 3 (three) times daily as needed. (Patient not taking: Reported on 10/16/2023), Disp: 360 mL, Rfl: 0   Meloxicam 7.5 MG/5ML SUSP, Take 5 mLs (7.5 mg total) by mouth daily., Disp: 150 mL, Rfl: 0   omeprazole (KONVOMEP) 2 mg/mL SUSP oral suspension, Take 20 mLs (40 mg total) by mouth 2 (two) times daily before a meal., Disp: 1200 mL, Rfl: 1   ondansetron (ZOFRAN-ODT) 4 MG disintegrating tablet, Take 1 tablet (4 mg total) by mouth every 8 (eight) hours as needed for nausea or vomiting., Disp: 20 tablet, Rfl: 0   SUMAtriptan (IMITREX) 20  MG/ACT nasal spray, Place 1 spray (20 mg total) into the nose as needed for migraine or headache. May repeat in 2 hours if headache persists or recurs., Disp: 1 each, Rfl: 2  Allergies: No Known Allergies  Mcneil Sober, NP

## 2023-11-22 NOTE — ED Notes (Signed)
 TTS cart ready, notified psych NP.

## 2023-11-22 NOTE — ED Notes (Signed)
 Pt given ice water, meal for lunch and work note.  Pt left ambulatory

## 2023-11-22 NOTE — ED Notes (Signed)
 Ativan given at this time but unable to chart d/t Epic malfunction.

## 2023-11-22 NOTE — ED Notes (Signed)
 Does not need to dress out for psych eval per Dr. Dolores Frame.

## 2023-11-22 NOTE — ED Notes (Addendum)
 Per Cranston Neighbor, NP, patient has been psych cleared and need outpatient therapy services. [facility] spoke to patient to inquire about date and time preferences.  Patient stated was open to therapy services but did not want same therapist as before due to experience.  Patient confirmed availability for morning hours any day M-F.  [facility] scheduled an appointment with [facility] outpatient services for Friday, April 4th at 10am.  [facility] provided patient with documentation of appointment date/time, location address, and contact number. Patient is agreeable with all details of appointment.  Graylon Good, [facility]

## 2023-11-22 NOTE — Discharge Instructions (Signed)
 Cleared by psychiatry for discharge home follow-up with their resources

## 2023-11-22 NOTE — ED Notes (Signed)
 TTS in progress

## 2023-11-25 ENCOUNTER — Other Ambulatory Visit: Payer: Self-pay

## 2023-11-25 ENCOUNTER — Emergency Department
Admission: EM | Admit: 2023-11-25 | Discharge: 2023-11-25 | Disposition: A | Discharge status: Home / Self Care | Attending: Emergency Medicine | Admitting: Emergency Medicine

## 2023-11-25 ENCOUNTER — Emergency Department

## 2023-11-25 ENCOUNTER — Encounter: Payer: Self-pay | Admitting: Intensive Care

## 2023-11-25 DIAGNOSIS — R0789 Other chest pain: Secondary | ICD-10-CM | POA: Insufficient documentation

## 2023-11-25 DIAGNOSIS — R079 Chest pain, unspecified: Secondary | ICD-10-CM | POA: Diagnosis not present

## 2023-11-25 LAB — BASIC METABOLIC PANEL WITH GFR
Anion gap: 9 (ref 5–15)
BUN: 11 mg/dL (ref 6–20)
CO2: 22 mmol/L (ref 22–32)
Calcium: 9.2 mg/dL (ref 8.9–10.3)
Chloride: 106 mmol/L (ref 98–111)
Creatinine, Ser: 0.95 mg/dL (ref 0.61–1.24)
GFR, Estimated: >60 mL/min (ref >60)
Glucose, Bld: 102 mg/dL — ABNORMAL HIGH (ref 70–99)
Potassium: 3.9 mmol/L (ref 3.5–5.1)
Sodium: 137 mmol/L (ref 135–145)

## 2023-11-25 LAB — TROPONIN I (HIGH SENSITIVITY): Troponin I (High Sensitivity): 4 ng/L (ref <18)

## 2023-11-25 LAB — CBC
HCT: 44 % (ref 39.0–52.0)
Hemoglobin: 15.1 g/dL (ref 13.0–17.0)
MCH: 30.1 pg (ref 26.0–34.0)
MCHC: 34.3 g/dL (ref 30.0–36.0)
MCV: 87.8 fL (ref 80.0–100.0)
Platelets: 217 10*3/uL (ref 150–400)
RBC: 5.01 MIL/uL (ref 4.22–5.81)
RDW: 12.4 % (ref 11.5–15.5)
WBC: 7.2 10*3/uL (ref 4.0–10.5)
nRBC: 0 % (ref 0.0–0.2)

## 2023-11-25 LAB — D-DIMER, QUANTITATIVE: D-Dimer, Quant: <0.27 ug{FEU}/mL (ref 0.00–0.50)

## 2023-11-25 MED ORDER — NAPROXEN 125 MG/5ML PO SUSP: 500.0000 mg | Freq: Two times a day (BID) | ORAL | 0 refills | Status: DC

## 2023-11-25 MED ORDER — KETOROLAC TROMETHAMINE 15 MG/ML IJ SOLN
15.0000 mg | Freq: Once | INTRAMUSCULAR | Status: AC
  Administered 2023-11-25: 15 mg via INTRAVENOUS
  Filled 2023-11-25: qty 1

## 2023-11-25 MED ORDER — LORAZEPAM 2 MG/ML IJ SOLN
1.0000 mg | Freq: Once | INTRAMUSCULAR | Status: AC
Start: 2023-11-25 — End: 2023-11-25
  Administered 2023-11-25: 1 mg via INTRAVENOUS
  Filled 2023-11-25: qty 1

## 2023-11-25 NOTE — ED Notes (Signed)
 Discharged by Gearldine Bienenstock L. Paramedic. Patient is calling mother to pick him up.

## 2023-11-25 NOTE — ED Triage Notes (Signed)
 Patient c/o left sided chest pain. Reports intermittent left leg and foot numbness for days. Seen for same 11/21/23. C/o headache also.  Has been taking pepcid and prozac

## 2023-11-25 NOTE — ED Provider Notes (Signed)
 [facility] Provider Note    Event Date/Time   First MD Initiated Contact with Patient 11/25/23 1147     (approximate)   History   Chest Pain   HPI Timothy [name] is a 28 y.o. male presenting today for chest pain.  He states today the chest pain feels like it is underneath his left chest.  Notices it with movement and with pressing.  Intermittent shortness of breath.  He states he has had longstanding chest pain has been seen frequently in the ED for it.  Has also been seen by GI and cardiology with negative stress test in the past.  Currently on Pepcid and seeing psychiatry and on Prozac.  He is not sure if it is related to his anxiety still.  The chest pain symptoms seem to move throughout his chest each day.  Intermittent nausea but no vomiting.  Denies abdominal pain, constipation, diarrhea, leg pain, leg swelling.  Chart review: This is patient's 15th ED visit in the past 3 months for similar symptoms.     Physical Exam   Triage Vital Signs: ED Triage Vitals  Encounter Vitals Group     BP 11/25/23 1143 125/83     Systolic BP Percentile --      Diastolic BP Percentile --      Pulse Rate 11/25/23 1143 91     Resp 11/25/23 1143 16     Temp 11/25/23 1141 98.6 F (37 C)     Temp Source 11/25/23 1141 Oral     SpO2 11/25/23 1143 97 %     Weight 11/25/23 1141 267 lb (121.1 kg)     Height 11/25/23 1141 6' (1.829 m)     Head Circumference --      Peak Flow --      Pain Score 11/25/23 1140 8     Pain Loc --      Pain Education --      Exclude from Growth Chart --     Most recent vital signs: Vitals:   11/25/23 1306 11/25/23 1330  BP: 119/70 117/69  Pulse: 78 67  Resp: 16 (!) 24  Temp:    SpO2: 96% 94%   Physical Exam: I have reviewed the vital signs and nursing notes. General: Awake, alert, no acute distress.  Nontoxic appearing. Head:  Atraumatic, normocephalic.   ENT:  EOM intact, PERRL. Oral mucosa is pink and moist with no  lesions. Neck: Neck is supple with full range of motion, No meningeal signs. Cardiovascular:  RRR, No murmurs. Peripheral pulses palpable and equal bilaterally. Chest wall: Mild tenderness to palpation over left lower chest wall without obvious erythema or swelling Respiratory:  Symmetrical chest wall expansion.  No rhonchi, rales, or wheezes.  Good air movement throughout.  No use of accessory muscles.   Musculoskeletal:  No cyanosis or edema. Moving extremities with full ROM Abdomen:  Soft, nontender, nondistended. Neuro:  GCS 15, moving all four extremities, interacting appropriately. Speech clear. Psych:  Very anxious  Skin:  Warm, dry, no rash.    ED Results / Procedures / Treatments   Labs (all labs ordered are listed, but only abnormal results are displayed) Labs Reviewed  BASIC METABOLIC PANEL WITH GFR - Abnormal; Notable for the following components:      Result Value   Glucose, Bld 102 (*)    All other components within normal limits  CBC  D-DIMER, QUANTITATIVE  TROPONIN I (HIGH SENSITIVITY)     EKG My  EKG interpretation: Rate of 91, normal sinus rhythm, normal axis, normal intervals.  No acute ST elevations or depressions   RADIOLOGY Independently interpreted chest x-ray with no acute pathology   PROCEDURES:  Critical Care performed: No  Procedures   MEDICATIONS ORDERED IN ED: Medications  ketorolac (TORADOL) 15 MG/ML injection 15 mg (15 mg Intravenous Given 11/25/23 1309)  LORazepam (ATIVAN) injection 1 mg (1 mg Intravenous Given 11/25/23 1305)     IMPRESSION / MDM / ASSESSMENT AND PLAN / ED COURSE  I reviewed the triage vital signs and the nursing notes.                              Differential diagnosis includes, but is not limited to, pneumonia, pneumothorax, costochondritis, lower suspicion ACS or PE, anxiety  Patient's presentation is most consistent with exacerbation of chronic illness.  Patient is a 28 year old male presenting today for  ongoing chest pain symptoms.  Has been seen frequently in the ED for similar symptoms and his chest pain frequently moves throughout his chest.  Today is on the left side with mild tenderness to palpation over the area.  Vital signs otherwise stable and exam otherwise unremarkable.  EKG consistent with his baseline.  Chest x-ray shows no acute cardiopulmonary abnormalities.  Troponin negative and given duration of symptoms do not feel he needs a repeat.  D-dimer also negative.  Patient was feeling slightly better after Toradol and Ativan.  No other emergent pathology and recommend he continue following up with his cardiology team outpatient.  Will start him on naproxen for potential costochondritis chest wall pain.  The patient is on the cardiac monitor to evaluate for evidence of arrhythmia and/or significant heart rate changes. Clinical Course as of 11/25/23 1451  Sat Nov 25, 2023  1450 Patient felt like he had symptomatic improvement with Toradol.  Will discharge on anti-inflammatories to treat potential costochondritis [DW]    Clinical Course User Index [DW] Janith Lima, MD     FINAL CLINICAL IMPRESSION(S) / ED DIAGNOSES   Final diagnoses:  Chest wall pain     Rx / DC Orders   ED Discharge Orders          Ordered    naproxen (NAPROSYN) 125 MG/5ML suspension  2 times daily with meals        11/25/23 1451             Note:  This document was prepared using Dragon voice recognition software and may include unintentional dictation errors.   Janith Lima, MD 11/25/23 [phone]

## 2023-11-25 NOTE — Discharge Instructions (Signed)
 I have sent a medication to your pharmacy to take twice daily to attempt to help with these pain symptoms.  Please continue following up with your cardiology and primary care provider.  There are no additional test we can perform here in the ED so I recommend continued outpatient testing for further evaluation.

## 2023-11-27 ENCOUNTER — Other Ambulatory Visit: Payer: Self-pay

## 2023-11-27 ENCOUNTER — Emergency Department

## 2023-11-27 ENCOUNTER — Emergency Department
Admission: EM | Admit: 2023-11-27 | Discharge: 2023-11-27 | Disposition: A | Discharge status: Home / Self Care | Attending: Emergency Medicine | Admitting: Emergency Medicine

## 2023-11-27 DIAGNOSIS — R519 Headache, unspecified: Secondary | ICD-10-CM | POA: Insufficient documentation

## 2023-11-27 NOTE — ED Notes (Signed)
 has stabbing pain in right back head, but can feel it on left sometimes too.says has episodes of pins and needle feeling in whole head.today had numb feeling right side face and left lower extre79mitiy also says jaw line started hurting today after vomiting.

## 2023-11-27 NOTE — ED Provider Notes (Signed)
 [facility] Provider Note    Event Date/Time   First MD Initiated Contact with Patient 11/27/23 1551     (approximate)   History   Headache   HPI  Timothy [name] is a 28 y.o. male with history of anxiety, hypertriglyceridemia, GERD, presents emergency department complaining of headache.  States headaches have been ongoing since December 13.  No change in the headaches.  No nausea or vomiting.  Has not take any medication for it was placed on Prozac by his psychiatrist.  Patient has not followed up with neurology as previously instructed.  States he will make the right side of his head hurt      Physical Exam   Triage Vital Signs: ED Triage Vitals  Encounter Vitals Group     BP 11/27/23 1358 128/79     Systolic BP Percentile --      Diastolic BP Percentile --      Pulse Rate 11/27/23 1358 82     Resp 11/27/23 1358 17     Temp 11/27/23 1358 98.2 F (36.8 C)     Temp Source 11/27/23 1358 Oral     SpO2 11/27/23 1358 97 %     Weight 11/27/23 1401 267 lb (121.1 kg)     Height 11/27/23 1401 6' (1.829 m)     Head Circumference --      Peak Flow --      Pain Score 11/27/23 1404 8     Pain Loc --      Pain Education --      Exclude from Growth Chart --     Most recent vital signs: Vitals:   11/27/23 1358  BP: 128/79  Pulse: 82  Resp: 17  Temp: 98.2 F (36.8 C)  SpO2: 97%     General: Awake, no distress.   CV:  Good peripheral perfusion. regular rate and  rhythm Resp:  Normal effort. Lungs CTA Abd:  No distention.   Other:  Patient appears well, cranial nerves II through XII grossly intact, neurovascular intact, grips equal bilaterally   ED Results / Procedures / Treatments   Labs (all labs ordered are listed, but only abnormal results are displayed) Labs Reviewed - No data to display   EKG     RADIOLOGY CT of the head    PROCEDURES:   Procedures Chief Complaint  Patient presents with   Headache      MEDICATIONS  ORDERED IN ED: Medications - No data to display   IMPRESSION / MDM / ASSESSMENT AND PLAN / ED COURSE  I reviewed the triage vital signs and the nursing notes.                              Differential diagnosis includes, but is not limited to, tension headache, migraine, medication reaction, anxiety  Patient's presentation is most consistent with acute illness / injury with system symptoms.   CT of the head independently reviewed interpreted by me as being negative for any acute abnormality  I did explain findings to patient.  I encouraged him to follow-up with neurology most this been ongoing for a while.  I did offer pain medication which he refused at this time.  Once again instructed him to see his primary care doctor along with neurology.  Discharged stable condition.      FINAL CLINICAL IMPRESSION(S) / ED DIAGNOSES   Final diagnoses:  Bad headache  Rx / DC Orders   ED Discharge Orders     None        Note:  This document was prepared using Dragon voice recognition software and may include unintentional dictation errors.    Faythe Ghee, PA-C 11/27/23 1655    Merwyn Katos, MD 11/27/23 [phone]

## 2023-11-27 NOTE — ED Triage Notes (Signed)
 Pt comes with headache for about week. Pt states it comes and goes. Pt states his right side of face hurts too.

## 2023-11-28 ENCOUNTER — Encounter: Payer: Self-pay | Admitting: Internal Medicine

## 2023-11-28 ENCOUNTER — Ambulatory Visit: Attending: Student

## 2023-11-28 ENCOUNTER — Ambulatory Visit: Payer: 59 | Admitting: Gastroenterology

## 2023-11-28 ENCOUNTER — Emergency Department
Admission: EM | Admit: 2023-11-28 | Discharge: 2023-11-29 | Disposition: A | Discharge status: Home / Self Care | Attending: Emergency Medicine | Admitting: Emergency Medicine

## 2023-11-28 ENCOUNTER — Other Ambulatory Visit: Payer: Self-pay

## 2023-11-28 DIAGNOSIS — F419 Anxiety disorder, unspecified: Secondary | ICD-10-CM | POA: Diagnosis not present

## 2023-11-28 DIAGNOSIS — R0789 Other chest pain: Secondary | ICD-10-CM | POA: Diagnosis not present

## 2023-11-28 DIAGNOSIS — R079 Chest pain, unspecified: Secondary | ICD-10-CM | POA: Diagnosis not present

## 2023-11-28 DIAGNOSIS — R519 Headache, unspecified: Secondary | ICD-10-CM | POA: Diagnosis not present

## 2023-11-28 LAB — COMPREHENSIVE METABOLIC PANEL WITH GFR
ALT: 32 U/L (ref 0–44)
AST: 26 U/L (ref 15–41)
Albumin: 4.3 g/dL (ref 3.5–5.0)
Alkaline Phosphatase: 61 U/L (ref 38–126)
Anion gap: 10 (ref 5–15)
BUN: 13 mg/dL (ref 6–20)
CO2: 20 mmol/L — ABNORMAL LOW (ref 22–32)
Calcium: 9 mg/dL (ref 8.9–10.3)
Chloride: 105 mmol/L (ref 98–111)
Creatinine, Ser: 0.86 mg/dL (ref 0.61–1.24)
GFR, Estimated: >60 mL/min (ref >60)
Glucose, Bld: 137 mg/dL — ABNORMAL HIGH (ref 70–99)
Potassium: 3.5 mmol/L (ref 3.5–5.1)
Sodium: 135 mmol/L (ref 135–145)
Total Bilirubin: 0.5 mg/dL (ref 0.0–1.2)
Total Protein: 7.4 g/dL (ref 6.5–8.1)

## 2023-11-28 LAB — ECHOCARDIOGRAM COMPLETE
AR max vel: 3.97 cm2
AV Area VTI: 3.64 cm2
AV Area mean vel: 3.64 cm2
AV Mean grad: 4 mmHg
AV Peak grad: 7.1 mmHg
Ao pk vel: 1.33 m/s
Area-P 1/2: 4.31 cm2
Calc EF: 54.8 %
S' Lateral: 4 cm
Single Plane A2C EF: 54.1 %
Single Plane A4C EF: 53.6 %

## 2023-11-28 LAB — CBC
HCT: 45 % (ref 39.0–52.0)
Hemoglobin: 15.8 g/dL (ref 13.0–17.0)
MCH: 30.4 pg (ref 26.0–34.0)
MCHC: 35.1 g/dL (ref 30.0–36.0)
MCV: 86.7 fL (ref 80.0–100.0)
Platelets: 227 10*3/uL (ref 150–400)
RBC: 5.19 MIL/uL (ref 4.22–5.81)
RDW: 12.6 % (ref 11.5–15.5)
WBC: 9.6 10*3/uL (ref 4.0–10.5)
nRBC: 0 % (ref 0.0–0.2)

## 2023-11-28 LAB — ACETAMINOPHEN LEVEL: Acetaminophen (Tylenol), Serum: <10 ug/mL — ABNORMAL LOW (ref 10–30)

## 2023-11-28 LAB — SALICYLATE LEVEL: Salicylate Lvl: <7 mg/dL — ABNORMAL LOW (ref 7.0–30.0)

## 2023-11-28 LAB — ETHANOL: Alcohol, Ethyl (B): <10 mg/dL (ref <10)

## 2023-11-28 MED ORDER — ACETAMINOPHEN 160 MG/5ML PO SOLN
650.0000 mg | Freq: Once | ORAL | Status: AC
  Administered 2023-11-28: 650 mg via ORAL
  Filled 2023-11-28: qty 20.3

## 2023-11-28 NOTE — ED Notes (Signed)
 Pt reports body aches all over his body.  Pt is taking prozac which pt states makes his head hurt worse.   Pt reports anxiety and depression history.  Pt denies SI or HI.  Pt denies drugs or etoh use.  Pt in hallway bed.  Pt is calm and cooperative.

## 2023-11-28 NOTE — ED Notes (Signed)
 Pt belongings:  Training and development officer pants Plaid underwear White hat [employer]

## 2023-11-28 NOTE — ED Triage Notes (Addendum)
 Pt reports he has been up here a lot over the past few weeks and "no one has been able to find out what's wrong with me" pt reports he has been very anxious over his health recently. Pt states he is scared for his life and feels like he needs to be monitored also states he was taken off lexapro and started on Prozac that has not been helping his anxiety. Pt is extremely anxious in triage.

## 2023-11-29 ENCOUNTER — Encounter: Payer: Self-pay | Admitting: Cardiovascular Disease

## 2023-11-29 LAB — TROPONIN I (HIGH SENSITIVITY): Troponin I (High Sensitivity): 4 ng/L (ref <18)

## 2023-11-29 LAB — LIPASE, BLOOD: Lipase: 29 U/L (ref 11–51)

## 2023-11-29 MED ORDER — FLUOXETINE HCL 20 MG/5ML PO SOLN
10.0000 mg | Freq: Every day | ORAL | Status: DC
  Filled 2023-11-29: qty 5

## 2023-11-29 MED ORDER — SUMATRIPTAN 20 MG/ACT NA SOLN: 1.0000 | NASAL | Status: DC | PRN

## 2023-11-29 MED ORDER — LORAZEPAM 2 MG/ML IJ SOLN: 1.0000 mg | Freq: Once | INTRAMUSCULAR | Status: DC

## 2023-11-29 MED ORDER — KETOROLAC TROMETHAMINE 30 MG/ML IJ SOLN
60.0000 mg | Freq: Once | INTRAMUSCULAR | Status: AC
  Administered 2023-11-29: 60 mg via INTRAMUSCULAR
  Filled 2023-11-29: qty 2

## 2023-11-29 MED ORDER — ONDANSETRON 4 MG PO TBDP: 4.0000 mg | ORAL_TABLET | Freq: Three times a day (TID) | ORAL | Status: DC | PRN

## 2023-11-29 MED ORDER — HYDROXYZINE HCL 10 MG/5ML PO SYRP: 10.0000 mg | ORAL_SOLUTION | Freq: Three times a day (TID) | ORAL | Status: DC | PRN

## 2023-11-29 MED ORDER — SUMATRIPTAN SUCCINATE 6 MG/0.5ML ~~LOC~~ SOLN: 6.0000 mg | SUBCUTANEOUS | Status: DC | PRN

## 2023-11-29 MED ORDER — KETOROLAC TROMETHAMINE 30 MG/ML IJ SOLN: 30.0000 mg | Freq: Once | INTRAMUSCULAR | Status: DC

## 2023-11-29 MED ORDER — SODIUM CHLORIDE 0.9 % IV BOLUS (SEPSIS): 1000.0000 mL | Freq: Once | INTRAVENOUS | Status: DC

## 2023-11-29 MED ORDER — OMEPRAZOLE 2 MG/ML ORAL SUSPENSION
40.0000 mg | Freq: Two times a day (BID) | ORAL | Status: DC
  Filled 2023-11-29 (×2): qty 20

## 2023-11-29 MED ORDER — LORAZEPAM 2 MG/ML IJ SOLN
1.0000 mg | Freq: Once | INTRAMUSCULAR | Status: AC
  Administered 2023-11-29: 1 mg via INTRAMUSCULAR
  Filled 2023-11-29: qty 1

## 2023-11-29 NOTE — ED Notes (Signed)
Pt oriented to unit.

## 2023-11-29 NOTE — ED Notes (Signed)
 Pt is verbally aggressive, insists that he came here for medical eval however was placed into psych against his will.  He stated that all of his "problems are medical nothing is wrong with my head"  Staff spoke with mother who stated they have been displaced and homeless since December of 2023. His mother stated that he has been unable to be consistent with employment as when he goes to work he has a problem "passing out". Pt does not wish to receive any medical any further. Pt denies all SI/HI stated he has no A/V hallucinations. Concerns as well as conversation with mother have been passed onto on call psych provider

## 2023-11-29 NOTE — [facility] Suicide Risk Assessment (Signed)

## 2023-11-29 NOTE — ED Notes (Signed)
 Pt is A/Ox 4, Timothy [name] declines any SI/HI stated that he does not have any A/V hallucinations.  Discharge instructions reviewed with Timothy [name], who verbalized understanding.  All Belongings accounted for and returned to PT.  Pt left ambulatory via POV

## 2023-11-29 NOTE — Discharge Summary (Signed)
 Physician Discharge Summary Note  Patient:  Timothy [name] is an 28 y.o., male MRN:  [id number] DOB:  [birth year] Patient phone:  [phone] (home)  Patient address:   [address] [place] [address],  Total Time spent with patient: 30 minutes  Date of Admission:  11/28/2023 Date of Discharge: 11/29/2023  Reason for Admission:  Anxiety  Principal Problem: <principal problem not specified> Discharge Diagnoses: Active Problems:   * No active hospital problems. *   Past Psychiatric History: GAD, MDD  Past Medical History:  Past Medical History:  Diagnosis Date   Allergy    Anxiety    Depression    Diverticulitis    Dyspnea    Fainting spell    in middle school   Family history of bone cancer    Family history of breast cancer    Hyperlipidemia     Past Surgical History:  Procedure Laterality Date   COLECTOMY WITH COLOSTOMY CREATION/HARTMANN PROCEDURE N/A 07/22/2018   Procedure: COLECTOMY WITH COLOSTOMY CREATION/HARTMANN PROCEDURE;  Surgeon: Duanne Guess, MD;  Location: [facility] ORS;  Service: General;  Laterality: N/A;   COLONOSCOPY WITH PROPOFOL N/A 11/01/2018   Procedure: COLONOSCOPY WITH PROPOFOL;  Surgeon: Wyline Mood, MD;  Location: [facility] ENDOSCOPY;  Service: Gastroenterology;  Laterality: N/A;   COLONOSCOPY WITH PROPOFOL N/A 05/26/2022   Procedure: COLONOSCOPY WITH PROPOFOL;  Surgeon: Wyline Mood, MD;  Location: [facility] ENDOSCOPY;  Service: Gastroenterology;  Laterality: N/A;   COLOSTOMY REVERSAL N/A 03/22/2019   Procedure: COLOSTOMY REVERSAL;  Surgeon: Duanne Guess, MD;  Location: [facility] ORS;  Service: General;  Laterality: N/A;   ESOPHAGOGASTRODUODENOSCOPY N/A 05/26/2022   Procedure: ESOPHAGOGASTRODUODENOSCOPY (EGD);  Surgeon: Wyline Mood, MD;  Location: [facility] ENDOSCOPY;  Service: Gastroenterology;  Laterality: N/A;   Family History:  Family History  Problem Relation Age of Onset   Alcohol abuse Mother    Depression Mother    Cervical cancer Mother    Depression  Father    Anxiety disorder Father    Healthy Sister    Alport syndrome Maternal Aunt    Heart disease Maternal Grandmother    Diabetes Maternal Grandmother    Cancer Maternal Grandmother        breast cancer   Alport syndrome Maternal Grandmother    Cancer Paternal Grandfather        Bone cancer   Family Psychiatric  History: NA Social History:  Social History   Substance and Sexual Activity  Alcohol Use Not Currently   Comment: Quit 07/2023     Social History   Substance and Sexual Activity  Drug Use Not Currently   Comment: THC slushly    Social History   Socioeconomic History   Marital status: Single    Spouse name: Not on file   Number of children: Not on file   Years of education: Not on file   Highest education level: High school graduate  Occupational History   Not on file  Tobacco Use   Smoking status: Former    Current packs/day: 0.25    Types: Cigarettes, E-cigarettes   Smokeless tobacco: Former    Types: Snuff   Tobacco comments:    pt smoke pack a week  Vaping Use   Vaping status: Every Day   Last attempt to quit: 09/09/2019   Substances: Nicotine, CBD, Flavoring  Substance and Sexual Activity   Alcohol use: Not Currently    Comment: Quit 07/2023   Drug use: Not Currently    Comment: THC slushly   Sexual activity:  Not Currently  Other Topics Concern   Not on file  Social History Narrative   Not on file   Social Drivers of Health   Financial Resource Strain: Not on file  Food Insecurity: Not on file  Transportation Needs: Not on file  Physical Activity: Not on file  Stress: Not on file  Social Connections: Not on file    Hospital Course:  Timothy [name] is a 28 year old male who presented to ED with chief complaint of  chest pain. He presented with  a hx of anxiety, depression , along with medical hx of obesity and hyperlipidemia. He reported  multiple visits to emergency department recently, with multiple concerns.  Timothy reported  headaches, chest and abdominal pain which he related to his hx of anxiety. He presented with no thoughts of self-harm or hallucinations. Patient refused UDS.   Upon assessment: patient is sitting in his bed, calm and cooperative. Alert and oriented x 4. He is casually dressed with decent hygiene. His thought process is coherent and goal directed. Patient does not appear to be preoccupied or responding to internal  stimuli. His eye contact is fair. Patient's speech is clear and well articulated.  He reports that he presented here with left chest pain but the problem is resolved. He reports that the chest pain may be related to his anxiety. Reports that he stopped taking his medication (Prozac) 2 days ago secondary to the side effects: reports that he was experiencing headaches, nausea as well as sexual dysfunction. He also reports that he was prescribed Hydroxyzine but it also gives him headaches. Patient reports that "I have my own way to deal with my depression and anxiety, I don't need no medications, I really wasn't expecting to come here".  When asked about hx of abuse/trauma, patient states "this is the only trauma I am going through, between being in a place like this...feels like prison or jail to me". Patient denies hx of emotional/physical/sexual abuse.  Patient denies substance abuse and states "I can drink a cold beer occasionally, that's it". Last use was 4 months ago. He reports that he vapes daily.  He reports that he lives with his mother Demetria Lightsey [phone]. He has 3 sisters who lives with their boyfriends. Patient reports that he is employed. He has a PCP Regina B. In the [facility]. He is looking for referrals for therapy services  near [place], [place] "the waiting list gets soo long".    Provider recommended  [facility], [facility], [address], [place], [place] [address]. Patient verbalizes motivation to start services. He insists that he will not take any  medication "its my right to refuse medications, isn't it ?".  Patient denies SI/HI/AVH and does not appear to be in any acute distress. Denies pain/discomfort. Denies chest/back/abdominal pain. Denies respiratory distress. He remains pleasant throughout this assessment.     Physical Findings: AIMS:  , ,  ,  ,  CIWA:    COWS:     Musculoskeletal: Strength & Muscle Tone: within normal limits Gait & Station: normal Patient leans: N/A   Psychiatric Specialty Exam:  Presentation  General Appearance:  Casual  Eye Contact: Fair  Speech: Clear and Coherent  Speech Volume: Normal  Handedness: Right   Mood and Affect  Mood: Anxious  Affect: Congruent   Thought Process  Thought Processes: Coherent  Descriptions of Associations:Intact  Orientation:Full (Time, Place and Person)  Thought Content:No data recorded History of Schizophrenia/Schizoaffective disorder:No  Duration of Psychotic Symptoms:No data recorded Hallucinations:Hallucinations: None  Ideas of Reference:None  Suicidal Thoughts:Suicidal Thoughts: No  Homicidal Thoughts:Homicidal Thoughts: No   Sensorium  Memory: Immediate Fair; Recent Fair; Remote Fair  Judgment: Fair  Insight: Fair   Art therapist  Concentration: Fair  Attention Span: Fair  Recall: [employer] of Knowledge: Fair  Language: Fair   Psychomotor Activity  Psychomotor Activity: Psychomotor Activity: Normal   Assets  Assets: Communication Skills; Desire for Improvement; Physical Health; Transportation   Sleep  Sleep: Sleep: Fair Number of Hours of Sleep: 5    Physical Exam: Physical Exam Vitals and nursing note reviewed.  Constitutional:      Appearance: He is obese.  HENT:     Head: Normocephalic and atraumatic.     Right Ear: Tympanic membrane normal.     Left Ear: Tympanic membrane normal.     Mouth/Throat:     Mouth: Mucous membranes are moist.  Eyes:     Extraocular Movements:  Extraocular movements intact.     Pupils: Pupils are equal, round, and reactive to light.  Cardiovascular:     Pulses: Normal pulses.  Pulmonary:     Effort: Pulmonary effort is normal.  Musculoskeletal:        General: Normal range of motion.     Cervical back: Normal range of motion and neck supple.  Neurological:     General: No focal deficit present.     Mental Status: He is alert and oriented to person, place, and time.  Psychiatric:        Mood and Affect: Mood normal.        Behavior: Behavior normal.        Thought Content: Thought content normal.    Review of Systems  Constitutional: Negative.   HENT: Negative.    Eyes: Negative.   Respiratory: Negative.    Cardiovascular: Negative.   Gastrointestinal: Negative.   Genitourinary: Negative.   Musculoskeletal: Negative.   Skin: Negative.   Neurological: Negative.   Endo/Heme/Allergies: Negative.   Psychiatric/Behavioral: Negative.     Blood pressure 113/79, pulse 79, temperature 98.2 F (36.8 C), temperature source Oral, resp. rate 18, height 6' (1.829 m), weight 121.1 kg, SpO2 98%. Body mass index is 36.21 kg/m.   Social History   Tobacco Use  Smoking Status Former   Current packs/day: 0.25   Types: Cigarettes, E-cigarettes  Smokeless Tobacco Former   Types: Snuff  Tobacco Comments   pt smoke pack a week   Tobacco Cessation:  N/A, patient does not currently use tobacco products   Blood Alcohol level:  Lab Results  Component Value Date   ETH <10 11/28/2023   ETH <10 02/22/2022    Metabolic Disorder Labs:  Lab Results  Component Value Date   HGBA1C 5.6 07/29/2022   MPG 114 07/29/2022   MPG 114 02/02/2022   No results found for: "PROLACTIN" Lab Results  Component Value Date   CHOL 160 07/29/2022   TRIG 166 (H) 07/29/2022   HDL 35 (L) 07/29/2022   CHOLHDL 4.6 07/29/2022   LDLCALC 99 07/29/2022   LDLCALC 90 02/02/2022    See Psychiatric Specialty Exam and Suicide Risk Assessment completed  by Attending Physician prior to discharge.  Discharge destination:  Home  Is patient on multiple antipsychotic therapies at discharge:  No   Has Patient had three or more failed trials of antipsychotic monotherapy by history:  No  Recommended Plan for Multiple Antipsychotic Therapies: NA  Discharge Instructions  Diet - low sodium heart healthy   Complete by: As directed    Increase activity slowly   Complete by: As directed       Allergies as of 11/29/2023   No Known Allergies      Medication List     STOP taking these medications    cyclobenzaprine 5 MG tablet Commonly known as: FLEXERIL   FLUoxetine 20 MG/5ML solution Commonly known as: PROZAC   hydrOXYzine 10 MG/5ML syrup Commonly known as: ATARAX   naproxen 125 MG/5ML suspension Commonly known as: NAPROSYN   omeprazole 2 mg/mL Susp oral suspension Commonly known as: KONVOMEP   ondansetron 4 MG disintegrating tablet Commonly known as: ZOFRAN-ODT   SUMAtriptan 20 MG/ACT nasal spray Commonly known as: IMITREX        Follow-up Information     Lorre Munroe, NP. Schedule an appointment as soon as possible for a visit today.   Specialties: Internal Medicine, Emergency Medicine Why: As needed Contact information: [address] [place] [address] [phone]                 Follow-up recommendations:  Activity:  As tolerated Diet:  Per PCP recommendations  Comments:  Discharge to current setting. Recommended outpatient services for therapy and medication management.   Signed: Olin Pia, NP 11/29/2023, 10:31 AM

## 2023-11-29 NOTE — BH Assessment (Signed)
 Comprehensive Clinical Assessment (CCA) Note  11/29/2023 [place] T Drury [id number]  Chief Complaint: Patient is a 28 year old male presenting to [facility] ED voluntarily. Per triage note Pt reports he has been up here a lot over the past few weeks and "no one has been able to find out what's wrong with me" pt reports he has been very anxious over his health recently. Pt states he is scared for his life and feels like he needs to be monitored also states he was taken off lexapro and started on Prozac that has not been helping his anxiety. Pt is extremely anxious in triage. During assessment patient appears alert and oriented x4, calm and cooperative. Patient is now reporting "I'm just in pain, I feel like I made a mistake checking in here, I have physical pain, chest pain, acid reflux and they keep blaming it on my anxiety." Patient currently has a primary care doctor but has not been seen in 2 months. Patient reports that he is employed but because of his physical pain he hasn't been able to work long hours. Patient denies SI/HI/AH/VH Chief Complaint  Patient presents with   Psychiatric Evaluation   Visit Diagnosis: Anxiety    CCA Screening, Triage and Referral (STR)  Patient Reported Information How did you hear about [place]? Self  Referral name: No data recorded Referral phone number: No data recorded  Whom do you see for routine medical problems? No data recorded Practice/Facility Name: No data recorded Practice/Facility Phone Number: No data recorded Name of Contact: No data recorded Contact Number: No data recorded Contact Fax Number: No data recorded Prescriber Name: No data recorded Prescriber Address (if known): No data recorded  What Is the Reason for Your Visit/Call Today? Pt reports he has been up here a lot over the past few weeks and "no one has been able to find out what's wrong with me" pt reports he has been very anxious over his health recently. Pt states he is scared for his life  and feels like he needs to be monitored also states he was taken off lexapro and started on Prozac that has not been helping his anxiety. Pt is extremely anxious in triage.  How Long Has This Been Causing You Problems? > than 6 months  What Do You Feel Would Help You the Most Today? Treatment for Depression or other mood problem   Have You Recently Been in Any Inpatient Treatment (Hospital/Detox/Crisis Center/28-Day Program)? No data recorded Name/Location of Program/Hospital:No data recorded How Long Were You There? No data recorded When Were You Discharged? No data recorded  Have You Ever Received Services From [facility] Before? No data recorded Who Do You See at [facility]? No data recorded  Have You Recently Had Any Thoughts About Hurting Yourself? No  Are You Planning to Commit Suicide/Harm Yourself At This time? No   Have you Recently Had Thoughts About Hurting Someone [name]? No  Explanation: No data recorded  Have You Used Any Alcohol or Drugs in the Past 24 Hours? No  How Long Ago Did You Use Drugs or Alcohol? No data recorded What Did You Use and How Much? No data recorded  Do You Currently Have a Therapist/Psychiatrist? No  Name of Therapist/Psychiatrist: No data recorded  Have You Been Recently Discharged From Any Office Practice or Programs? No  Explanation of Discharge From Practice/Program: No data recorded    CCA Screening Triage Referral Assessment Type of Contact: Face-to-Face  Is this Initial or Reassessment? No  data recorded Date Telepsych consult ordered in [facility]:  No data recorded Time Telepsych consult ordered in [facility]:  No data recorded  Patient Reported Information Reviewed? No data recorded Patient Left Without Being Seen? No data recorded Reason for Not Completing Assessment: No data recorded  Collateral Involvement: No data recorded  Does Patient Have a Court Appointed Legal Guardian? No data recorded Name and Contact of Legal Guardian: No  data recorded If Minor and Not Living with Parent(s), Who has Custody? No data recorded Is CPS involved or ever been involved? Never  Is APS involved or ever been involved? Never   Patient Determined To Be At Risk for Harm To Self or Others Based on Review of Patient Reported Information or Presenting Complaint? No  Method: No data recorded Availability of Means: No data recorded Intent: No data recorded Notification Required: No data recorded Additional Information for Danger to Others Potential: No data recorded Additional Comments for Danger to Others Potential: No data recorded Are There Guns or Other Weapons in Your Home? No  Types of Guns/Weapons: No data recorded Are These Weapons Safely Secured?                            No data recorded Who Could Verify You Are Able To Have These Secured: No data recorded Do You Have any Outstanding Charges, Pending Court Dates, Parole/Probation? No data recorded Contacted To Inform of Risk of Harm To Self or Others: No data recorded  Location of Assessment: [facility] ED   Does Patient Present under Involuntary Commitment? No  IVC Papers Initial File Date: No data recorded  [place] of Residence: [place]   Patient Currently Receiving the Following Services: No data recorded  Determination of Need: Emergent (2 hours)   Options For Referral: No data recorded    CCA Biopsychosocial Intake/Chief Complaint:  No data recorded Current Symptoms/Problems: No data recorded  Patient Reported Schizophrenia/Schizoaffective Diagnosis in Past: No   Strengths: Patient is able to communicate his needs; has employment  Preferences: No data recorded Abilities: No data recorded  Type of Services Patient Feels are Needed: No data recorded  Initial Clinical Notes/Concerns: No data recorded  Mental Health Symptoms Depression:  Change in energy/activity; Fatigue   Duration of Depressive symptoms: Greater than two weeks   Mania:  None    Anxiety:   Fatigue; Restlessness   Psychosis:  None   Duration of Psychotic symptoms: No data recorded  Trauma:  None   Obsessions:  None   Compulsions:  None   Inattention:  None   Hyperactivity/Impulsivity:  None   Oppositional/Defiant Behaviors:  None   Emotional Irregularity:  None   Other Mood/Personality Symptoms:  No data recorded   Mental Status Exam Appearance and self-care  Stature:  Tall   Weight:  Overweight   Clothing:  Casual   Grooming:  Normal   Cosmetic use:  None   Posture/gait:  Normal   Motor activity:  Not Remarkable   Sensorium  Attention:  Normal   Concentration:  Normal   Orientation:  X5   Recall/memory:  Normal   Affect and Mood  Affect:  Appropriate   Mood:  Other (Comment)   Relating  Eye contact:  Normal   Facial expression:  Responsive   Attitude toward examiner:  Cooperative   Thought and Language  Speech flow: Clear and Coherent   Thought content:  Appropriate to Mood and Circumstances   Preoccupation:  None  Hallucinations:  None   Organization:  No data recorded  [employer] of Knowledge:  Good   Intelligence:  Average   Abstraction:  Normal   Judgement:  Good   Reality Testing:  Realistic   Insight:  Good   Decision Making:  Normal   Social Functioning  Social Maturity:  Responsible   Social Judgement:  Normal   Stress  Stressors:  Illness   Coping Ability:  Normal   Skill Deficits:  None   Supports:  Family     Religion: Religion/Spirituality Are You A Religious Person?: No  Leisure/Recreation: Leisure / Recreation Do You Have Hobbies?: No  Exercise/Diet: Exercise/Diet Do You Exercise?: No Have You Gained or Lost A Significant Amount of Weight in the Past Six Months?: No Do You Follow a Special Diet?: No Do You Have Any Trouble Sleeping?: No   CCA Employment/Education Employment/Work Situation: Employment / Work Situation Employment Situation:  Employed Work Stressors: None reported Patient's Job has Been Impacted by Current Illness: No Has Patient ever Been in Equities trader?: No  Education: Education Did You Have An Individualized Education Program (IIEP): No Did You Have Any Difficulty At [employer]?: No Patient's Education Has Been Impacted by Current Illness: No   CCA Family/Childhood History Family and Relationship History: Family history Marital status: Single Does patient have children?: No  Childhood History:  Childhood History Did patient suffer any verbal/emotional/physical/sexual abuse as a child?: No Did patient suffer from severe childhood neglect?: No Has patient ever been sexually abused/assaulted/raped as an adolescent or adult?: No Was the patient ever a victim of a crime or a disaster?: No Witnessed domestic violence?: No Has patient been affected by domestic violence as an adult?: No  Child/Adolescent Assessment:     CCA Substance Use Alcohol/Drug Use: Alcohol / Drug Use Pain Medications: see mar Prescriptions: see mar Over the Counter: see mar History of alcohol / drug use?: No history of alcohol / drug abuse                         ASAM's:  Six Dimensions of Multidimensional Assessment  Dimension 1:  Acute Intoxication and/or Withdrawal Potential:      Dimension 2:  Biomedical Conditions and Complications:      Dimension 3:  Emotional, Behavioral, or Cognitive Conditions and Complications:     Dimension 4:  Readiness to Change:     Dimension 5:  Relapse, Continued use, or Continued Problem Potential:     Dimension 6:  Recovery/Living Environment:     ASAM Severity Score:    ASAM Recommended Level of Treatment:     Substance use Disorder (SUD)    Recommendations for Services/Supports/Treatments:    DSM5 Diagnoses: Patient Active Problem List   Diagnosis Date Noted   Hypertriglyceridemia 06/23/2022   Insomnia 06/23/2022   Gastroesophageal reflux disease without  esophagitis 01/25/2022   Class 2 obesity due to excess calories with body mass index (BMI) of 35.0 to 35.9 in adult 03/29/2021   Anxiety 07/28/2017    Patient Centered Plan: Patient is on the following Treatment Plan(s):  Anxiety   Referrals to Alternative Service(s): Referred to Alternative Service(s):   Place:   Date:   Time:    Referred to Alternative Service(s):   Place:   Date:   Time:    Referred to Alternative Service(s):   Place:   Date:   Time:    Referred to Alternative Service(s):   Place:  Date:   Time:      @BHCOLLABOFCARE @  [employer], LCAS-A

## 2023-11-29 NOTE — ED Provider Notes (Addendum)
 Emergency Medicine Observation Re-evaluation Note  Timothy [name] is a 28 y.o. male, seen on rounds today.  Pt initially presented to the ED for complaints of Psychiatric Evaluation Currently, the patient is resting.  Physical Exam  BP 124/88   Pulse 89   Temp 98.5 F (36.9 C) (Oral)   Resp 18   Ht 6' (1.829 m)   Wt 121.1 kg   SpO2 98%   BMI 36.21 kg/m  Physical Exam .Gen:  No acute distress Resp:  Breathing easily and comfortably, no accessory muscle usage Neuro:  Moving all four extremities, no gross focal neuro deficits Psych:  Resting currently, calm when awake   ED Course / MDM  EKG:EKG Interpretation Date/Time:  Wednesday November 29 2023 01:00:30 EDT Ventricular Rate:  58 PR Interval:  136 QRS Duration:  88 QT Interval:  400 QTC Calculation: 392 R Axis:   27  Text Interpretation: Sinus bradycardia Nonspecific T wave abnormality Abnormal ECG When compared with ECG of 25-Nov-2023 11:40, Vent. rate has decreased BY  33 BPM Confirmed by Ward, Baxter Hire [phone]) on 11/29/2023 1:47:10 AM  I have reviewed the labs performed to date as well as medications administered while in observation.  Recent changes in the last 24 hours include no acute events.  Plan  Current plan is for psych dispo and reevaluation.  10:50 AM: Patient reevaluated by psych, cleared by them for discharge.    Claybon Jabs, MD 11/29/23 [id number]    Claybon Jabs, MD 11/29/23 [id number]    Claybon Jabs, MD 11/29/23 1050

## 2023-11-29 NOTE — ED Notes (Signed)
 Report called to dorian rn bhu nurse.   Meds given.

## 2023-11-29 NOTE — BH Assessment (Signed)
 TTS spoke with IRIS via phone and secure chat to place Psych Consult.

## 2023-11-29 NOTE — ED Notes (Signed)
 Pt gave verbal permission to speak with Mother Viviana Simpler @ [phone], this was witnessed by [facility] security x1 as well as NP x1

## 2023-11-29 NOTE — ED Notes (Signed)
Vol /psych consult ordered/ pending  

## 2023-11-29 NOTE — ED Notes (Signed)
 Pt to bhu with rn and security

## 2023-11-29 NOTE — ED Provider Notes (Signed)
 [facility] Provider Note    Event Date/Time   First MD Initiated Contact with Patient 11/28/23 2315     (approximate)   History   Psychiatric Evaluation   HPI  Timothy [name] is a 28 y.o. male with history of anxiety, depression, obesity, hyperlipidemia who presents to the emergency department requesting psychiatric evaluation.  Patient reports over the past few weeks he has been seen multiple times in our emergency department, urgent care and at [facility] for multiple different complaints.  Reports intermittent sharp headaches, chest pain and diffuse abdominal pain, syncope.  He was told that symptoms may be related to anxiety and he feels like his anxiety does worsen his symptoms but the symptoms also worsen his anxiety.  He denies SI, HI or hallucinations but feels he may need psychiatric admission as this was recommended previously.  He has had extensive workup in the ER in the past 2 months including negative CT head, negative CT abdomen pelvis, negative right upper quadrant ultrasound, negative troponins and D-dimer, clear chest x-rays.   History provided by patient.    Past Medical History:  Diagnosis Date   Allergy    Anxiety    Depression    Diverticulitis    Dyspnea    Fainting spell    in middle school   Family history of bone cancer    Family history of breast cancer    Hyperlipidemia     Past Surgical History:  Procedure Laterality Date   COLECTOMY WITH COLOSTOMY CREATION/HARTMANN PROCEDURE N/A 07/22/2018   Procedure: COLECTOMY WITH COLOSTOMY CREATION/HARTMANN PROCEDURE;  Surgeon: Duanne Guess, MD;  Location: [facility] ORS;  Service: General;  Laterality: N/A;   COLONOSCOPY WITH PROPOFOL N/A 11/01/2018   Procedure: COLONOSCOPY WITH PROPOFOL;  Surgeon: Wyline Mood, MD;  Location: [facility] ENDOSCOPY;  Service: Gastroenterology;  Laterality: N/A;   COLONOSCOPY WITH PROPOFOL N/A 05/26/2022   Procedure: COLONOSCOPY WITH PROPOFOL;  Surgeon:  Wyline Mood, MD;  Location: [facility] ENDOSCOPY;  Service: Gastroenterology;  Laterality: N/A;   COLOSTOMY REVERSAL N/A 03/22/2019   Procedure: COLOSTOMY REVERSAL;  Surgeon: Duanne Guess, MD;  Location: [facility] ORS;  Service: General;  Laterality: N/A;   ESOPHAGOGASTRODUODENOSCOPY N/A 05/26/2022   Procedure: ESOPHAGOGASTRODUODENOSCOPY (EGD);  Surgeon: Wyline Mood, MD;  Location: [facility] ENDOSCOPY;  Service: Gastroenterology;  Laterality: N/A;    MEDICATIONS:  Prior to Admission medications   Medication Sig Start Date End Date Taking? Authorizing Provider  cyclobenzaprine (FLEXERIL) 5 MG tablet Take 1 tablet (5 mg total) by mouth 3 (three) times daily as needed for muscle spasms. 10/30/23   Loleta Rose, MD  FLUoxetine (PROZAC) 20 MG/5ML solution Take 2.5 mLs (10 mg total) by mouth daily. 10/10/23   Lorre Munroe, NP  hydrOXYzine (ATARAX) 10 MG/5ML syrup Take 5 mLs (10 mg total) by mouth 3 (three) times daily as needed. Patient not taking: Reported on 10/16/2023 10/10/23 01/08/24  Lorre Munroe, NP  naproxen (NAPROSYN) 125 MG/5ML suspension Take 20 mLs (500 mg total) by mouth 2 (two) times daily with a meal for 14 days. 11/25/23 12/09/23  Janith Lima, MD  omeprazole (KONVOMEP) 2 mg/mL SUSP oral suspension Take 20 mLs (40 mg total) by mouth 2 (two) times daily before a meal. 11/14/23   Wyline Mood, MD  ondansetron (ZOFRAN-ODT) 4 MG disintegrating tablet Take 1 tablet (4 mg total) by mouth every 8 (eight) hours as needed for nausea or vomiting. 11/15/23   Gladys Damme, PA-C  SUMAtriptan (IMITREX) 20 MG/ACT nasal spray  Place 1 spray (20 mg total) into the nose as needed for migraine or headache. May repeat in 2 hours if headache persists or recurs. 11/15/23 11/14/24  Gladys Damme, PA-C    Physical Exam   Triage Vital Signs: ED Triage Vitals  Encounter Vitals Group     BP 11/28/23 2016 124/88     Systolic BP Percentile --      Diastolic BP Percentile --      Pulse Rate 11/28/23 2016 89     Resp  11/28/23 2016 18     Temp 11/28/23 2016 98.5 F (36.9 C)     Temp Source 11/28/23 2016 Oral     SpO2 11/28/23 2016 98 %     Weight 11/28/23 2015 267 lb (121.1 kg)     Height 11/28/23 2015 6' (1.829 m)     Head Circumference --      Peak Flow --      Pain Score 11/28/23 2015 5     Pain Loc --      Pain Education --      Exclude from Growth Chart --     Most recent vital signs: Vitals:   11/28/23 2016  BP: 124/88  Pulse: 89  Resp: 18  Temp: 98.5 F (36.9 C)  SpO2: 98%    CONSTITUTIONAL: Alert, responds appropriately to questions. Well-appearing; well-nourished, obese, anxious HEAD: Normocephalic, atraumatic EYES: Conjunctivae clear, pupils appear equal, sclera nonicteric ENT: normal nose; moist mucous membranes NECK: Supple, normal ROM CARD: RRR; S1 and S2 appreciated RESP: Normal chest excursion without splinting or tachypnea; breath sounds clear and equal bilaterally; no wheezes, no rhonchi, no rales, no hypoxia or respiratory distress, speaking full sentences ABD/GI: Non-distended; soft, non-tender, no rebound, no guarding, no peritoneal signs BACK: The back appears normal EXT: Normal ROM in all joints; no deformity noted, no edema, no calf tenderness or calf swelling SKIN: Normal color for age and race; warm; no rash on exposed skin NEURO: Moves all extremities equally, normal speech, normal gait, no facial asymmetry PSYCH: Anxious, rapid and pressured speech.  Tangential thought process.  Poor insight.  Denies SI, HI.   ED Results / Procedures / Treatments   LABS: (all labs ordered are listed, but only abnormal results are displayed) Labs Reviewed  COMPREHENSIVE METABOLIC PANEL WITH GFR - Abnormal; Notable for the following components:      Result Value   CO2 20 (*)    Glucose, Bld 137 (*)    All other components within normal limits  SALICYLATE LEVEL - Abnormal; Notable for the following components:   Salicylate Lvl <7.0 (*)    All other components within  normal limits  ACETAMINOPHEN LEVEL - Abnormal; Notable for the following components:   Acetaminophen (Tylenol), Serum <10 (*)    All other components within normal limits  ETHANOL  CBC  LIPASE, BLOOD  URINE DRUG SCREEN, QUALITATIVE ([facility] ONLY)  TROPONIN I (HIGH SENSITIVITY)     EKG:  EKG Interpretation Date/Time:  Wednesday November 29 2023 01:00:30 EDT Ventricular Rate:  58 PR Interval:  136 QRS Duration:  88 QT Interval:  400 QTC Calculation: 392 R Axis:   27  Text Interpretation: Sinus bradycardia Nonspecific T wave abnormality Abnormal ECG When compared with ECG of 25-Nov-2023 11:40, Vent. rate has decreased BY  33 BPM Confirmed by Rochele Raring [phone]) on 11/29/2023 1:47:10 AM         RADIOLOGY: My personal review and interpretation of imaging:  none  I have personally reviewed  all radiology reports.   ECHOCARDIOGRAM COMPLETE Result Date: 11/28/2023    ECHOCARDIOGRAM REPORT   Patient Name:   Timothy T Hubert Date of Exam: 11/28/2023 Medical Rec #:  [id number]      Height:       72.0 in Accession #:    [id number]     Weight:       267.0 lb Date of Birth:  1995/11/17      BSA:          2.409 m Patient Age:    28 years       BP:           129/83 mmHg Patient Gender: M              HR:           60 bpm. Exam Location:  [facility] Procedure: 2D Echo, Cardiac Doppler and Color Doppler (Both Spectral and Color            Flow Doppler were utilized during procedure). Indications:    R06.02 SOB; R55 Syncope; R07.9* Chest pain, unspecified  History:        Patient has no prior history of Echocardiogram examinations.                 Signs/Symptoms:Chest Pain, Shortness of Breath and Syncope; Risk                 Factors:Dyslipidemia and Former Smoker.  Sonographer:    Quentin Ore RDMS, RVT, RDCS Referring Phys: [id number] [facility] IMPRESSIONS  1. Left ventricular ejection fraction, by estimation, is 55 to 60%. Left ventricular ejection fraction by 2D MOD biplane is 54.8 %. Left ventricular  ejection fraction by PLAX is 56 %. The left ventricle has normal function. The left ventricle has no regional wall motion abnormalities. Left ventricular diastolic parameters were normal.  2. Right ventricular systolic function is normal. The right ventricular size is moderately enlarged.  3. The mitral valve is normal in structure. No evidence of mitral valve regurgitation.  4. The aortic valve is tricuspid. Aortic valve regurgitation is not visualized. FINDINGS  Left Ventricle: Left ventricular ejection fraction, by estimation, is 55 to 60%. Left ventricular ejection fraction by PLAX is 56 %. Left ventricular ejection fraction by 2D MOD biplane is 54.8 %. The left ventricle has normal function. The left ventricle has no regional wall motion abnormalities. Strain was performed and the global longitudinal strain is indeterminate. The left ventricular internal cavity size was normal in size. There is no left ventricular hypertrophy. Left ventricular diastolic parameters were normal. Right Ventricle: The right ventricular size is moderately enlarged. No increase in right ventricular wall thickness. Right ventricular systolic function is normal. Left Atrium: Left atrial size was normal in size. Right Atrium: Right atrial size was normal in size. Pericardium: There is no evidence of pericardial effusion. Mitral Valve: The mitral valve is normal in structure. No evidence of mitral valve regurgitation. Tricuspid Valve: The tricuspid valve is normal in structure. Tricuspid valve regurgitation is not demonstrated. Aortic Valve: The aortic valve is tricuspid. Aortic valve regurgitation is not visualized. Aortic valve mean gradient measures 4.0 mmHg. Aortic valve peak gradient measures 7.1 mmHg. Aortic valve area, by VTI measures 3.64 cm. Pulmonic Valve: The pulmonic valve was not well visualized. Pulmonic valve regurgitation is not visualized. Aorta: The aortic root is normal in size and structure. IAS/Shunts: No atrial  level shunt detected by color flow Doppler. Additional Comments: 3D was performed not requiring image  post processing on an independent workstation and was indeterminate.  LEFT VENTRICLE PLAX 2D                        Biplane EF (MOD) LV EF:         Left            LV Biplane EF:   Left                ventricular                      ventricular                ejection                         ejection                fraction by                      fraction by                PLAX is 56                       2D MOD                %.                               biplane is LVIDd:         5.70 cm                          54.8 %. LVIDs:         4.00 cm LV PW:         0.70 cm         Diastology LV IVS:        0.80 cm         LV e' medial:    11.90 cm/s LVOT diam:     2.30 cm         LV E/e' medial:  9.2 LV SV:         94              LV e' lateral:   14.60 cm/s LV SV Index:   39              LV E/e' lateral: 7.5 LVOT Area:     4.15 cm  LV Volumes (MOD) LV vol d, MOD    120.0 ml A2C: LV vol d, MOD    125.0 ml A4C: LV vol s, MOD    55.1 ml A2C: LV vol s, MOD    58.0 ml A4C: LV SV MOD A2C:   64.9 ml LV SV MOD A4C:   125.0 ml LV SV MOD BP:    69.3 ml RIGHT VENTRICLE RV Basal diam:  5.10 cm RV Mid diam:    3.80 cm RV S prime:     12.50 cm/s TAPSE (M-mode): 2.9 cm LEFT ATRIUM             Index        RIGHT ATRIUM           Index LA diam:        4.10  cm 1.70 cm/m   RA Area:     19.20 cm LA Vol (A2C):   69.7 ml 28.93 ml/m  RA Volume:   56.00 ml  23.25 ml/m LA Vol (A4C):   50.2 ml 20.84 ml/m LA Biplane Vol: 60.2 ml 24.99 ml/m  AORTIC VALVE                    PULMONIC VALVE AV Area (Vmax):    3.97 cm     PV Vmax:       0.95 m/s AV Area (Vmean):   3.64 cm     PV Peak grad:  3.6 mmHg AV Area (VTI):     3.64 cm AV Vmax:           133.00 cm/s AV Vmean:          85.600 cm/s AV VTI:            0.258 m AV Peak Grad:      7.1 mmHg AV Mean Grad:      4.0 mmHg LVOT Vmax:         127.00 cm/s LVOT Vmean:        74.900 cm/s LVOT  VTI:          0.226 m LVOT/AV VTI ratio: 0.88  AORTA Ao Root diam: 3.00 cm Ao Arch diam: 2.4 cm MITRAL VALVE MV Area (PHT): 4.31 cm     SHUNTS MV Decel Time: 176 msec     Systemic VTI:  0.23 m MV E velocity: 109.00 cm/s  Systemic Diam: 2.30 cm MV A velocity: 80.20 cm/s MV E/A ratio:  1.36 Debbe Odea MD Electronically signed by Debbe Odea MD Signature Date/Time: 11/28/2023/2:17:53 PM    Final      PROCEDURES:  Critical Care performed: No      Procedures    IMPRESSION / MDM / ASSESSMENT AND PLAN / ED COURSE  I reviewed the triage vital signs and the nursing notes.    Patient here with multiple symptoms including worsening anxiety and requesting psychiatric evaluation.    DIFFERENTIAL DIAGNOSIS (includes but not limited to):   Anxiety, panic attacks, somatization of anxiety, low suspicion for ACS, PE, dissection, pneumonia, pneumothorax, CHF, bowel obstruction, colitis, diverticulitis, cholecystitis, pancreatitis, UTI, kidney stone, pyelonephritis, intracranial hemorrhage, stroke, CVT, meningitis based on benign exam and multiple reassuring workups   Patient's presentation is most consistent with acute presentation with potential threat to life or bodily function.   PLAN: Will obtain screening labs, urine, EKG.  Patient has had negative troponins, D-dimer, right upper quadrant ultrasound, CT of the abdomen pelvis, CT of the head, chest x-ray all within the past 1 to 2 months for the same symptoms.  Discussed with patient that we do not find any emergent medical issue at this time and he now believes that anxiety is playing a role in his symptoms and I agree.  He states he was told he needed psychiatric admission the last time he was here but refused.  Patient states now he is agreeable to reevaluation by psychiatry.  He is requesting Ativan for anxiety and something for headache.  Will give IM Ativan, Toradol.   MEDICATIONS GIVEN IN ED: Medications  FLUoxetine (PROZAC)  20 MG/5ML solution 10 mg (has no administration in time range)  omeprazole (KONVOMEP) 2 mg/mL oral suspension 40 mg (has no administration in time range)  ondansetron (ZOFRAN-ODT) disintegrating tablet 4 mg (has no administration in time range)  hydrOXYzine (ATARAX) 10 MG/5ML syrup 10  mg (has no administration in time range)  SUMAtriptan (IMITREX) injection 6 mg (has no administration in time range)  acetaminophen (TYLENOL) 160 MG/5ML solution 650 mg (650 mg Oral Given 11/28/23 2236)  ketorolac (TORADOL) 30 MG/ML injection 60 mg (60 mg Intramuscular Given 11/29/23 0015)  LORazepam (ATIVAN) injection 1 mg (1 mg Intramuscular Given 11/29/23 0015)     ED COURSE: EKG nonischemic.  Troponin negative.  No indication for serial enzymes.  LFTs, lipase unremarkable.  Patient medically cleared at this time for psychiatric disposition.   CONSULTS: TTS and psychiatry consulted.   OUTSIDE RECORDS REVIEWED: Reviewed previous psychiatric notes and multiple recent ED notes.       FINAL CLINICAL IMPRESSION(S) / ED DIAGNOSES   Final diagnoses:  Anxiety  Atypical chest pain  Generalized headache     Rx / DC Orders   ED Discharge Orders     None        Note:  This document was prepared using Dragon voice recognition software and may include unintentional dictation errors.   Phillippe Orlick, Layla Maw, DO 11/29/23 [phone]

## 2023-11-29 NOTE — Discharge Instructions (Addendum)

## 2023-11-30 DIAGNOSIS — E785 Hyperlipidemia, unspecified: Secondary | ICD-10-CM | POA: Diagnosis not present

## 2023-11-30 DIAGNOSIS — F17210 Nicotine dependence, cigarettes, uncomplicated: Secondary | ICD-10-CM | POA: Diagnosis not present

## 2023-11-30 DIAGNOSIS — M25512 Pain in left shoulder: Secondary | ICD-10-CM | POA: Diagnosis not present

## 2023-11-30 DIAGNOSIS — M542 Cervicalgia: Secondary | ICD-10-CM | POA: Diagnosis not present

## 2023-11-30 DIAGNOSIS — R519 Headache, unspecified: Secondary | ICD-10-CM | POA: Diagnosis not present

## 2023-11-30 DIAGNOSIS — M25511 Pain in right shoulder: Secondary | ICD-10-CM | POA: Diagnosis not present

## 2023-11-30 DIAGNOSIS — G43809 Other migraine, not intractable, without status migrainosus: Secondary | ICD-10-CM | POA: Diagnosis not present

## 2023-12-04 ENCOUNTER — Ambulatory Visit: Payer: Self-pay | Admitting: Internal Medicine

## 2023-12-04 ENCOUNTER — Other Ambulatory Visit: Payer: Self-pay

## 2023-12-04 ENCOUNTER — Inpatient Hospital Stay: Admitting: Sleep Medicine

## 2023-12-04 ENCOUNTER — Emergency Department

## 2023-12-04 ENCOUNTER — Other Ambulatory Visit: Payer: Self-pay | Admitting: Emergency Medicine

## 2023-12-04 ENCOUNTER — Emergency Department
Admission: EM | Admit: 2023-12-04 | Discharge: 2023-12-04 | Disposition: A | Discharge status: Home / Self Care | Attending: Emergency Medicine | Admitting: Emergency Medicine

## 2023-12-04 DIAGNOSIS — R519 Headache, unspecified: Secondary | ICD-10-CM | POA: Diagnosis not present

## 2023-12-04 DIAGNOSIS — R0602 Shortness of breath: Secondary | ICD-10-CM | POA: Diagnosis not present

## 2023-12-04 DIAGNOSIS — F17200 Nicotine dependence, unspecified, uncomplicated: Secondary | ICD-10-CM | POA: Insufficient documentation

## 2023-12-04 DIAGNOSIS — R079 Chest pain, unspecified: Secondary | ICD-10-CM | POA: Insufficient documentation

## 2023-12-04 DIAGNOSIS — Z720 Tobacco use: Secondary | ICD-10-CM | POA: Diagnosis not present

## 2023-12-04 DIAGNOSIS — R110 Nausea: Secondary | ICD-10-CM | POA: Diagnosis not present

## 2023-12-04 DIAGNOSIS — R1032 Left lower quadrant pain: Secondary | ICD-10-CM | POA: Diagnosis not present

## 2023-12-04 DIAGNOSIS — R0789 Other chest pain: Secondary | ICD-10-CM | POA: Diagnosis not present

## 2023-12-04 LAB — BASIC METABOLIC PANEL WITH GFR
Anion gap: 9 (ref 5–15)
BUN: 13 mg/dL (ref 6–20)
CO2: 24 mmol/L (ref 22–32)
Calcium: 9.2 mg/dL (ref 8.9–10.3)
Chloride: 106 mmol/L (ref 98–111)
Creatinine, Ser: 0.72 mg/dL (ref 0.61–1.24)
GFR, Estimated: >60 mL/min (ref >60)
Glucose, Bld: 108 mg/dL — ABNORMAL HIGH (ref 70–99)
Potassium: 3.8 mmol/L (ref 3.5–5.1)
Sodium: 139 mmol/L (ref 135–145)

## 2023-12-04 LAB — CBC
HCT: 44.5 % (ref 39.0–52.0)
Hemoglobin: 15.5 g/dL (ref 13.0–17.0)
MCH: 30.8 pg (ref 26.0–34.0)
MCHC: 34.8 g/dL (ref 30.0–36.0)
MCV: 88.5 fL (ref 80.0–100.0)
Platelets: 229 10*3/uL (ref 150–400)
RBC: 5.03 MIL/uL (ref 4.22–5.81)
RDW: 12.4 % (ref 11.5–15.5)
WBC: 8.9 10*3/uL (ref 4.0–10.5)
nRBC: 0 % (ref 0.0–0.2)

## 2023-12-04 LAB — TROPONIN I (HIGH SENSITIVITY): Troponin I (High Sensitivity): 4 ng/L (ref <18)

## 2023-12-04 MED ORDER — PEG 3350-KCL-NA BICARB-NACL 420 G PO SOLR: ORAL | 0 refills | Status: AC

## 2023-12-04 MED ORDER — TRULANCE 3 MG PO TABS
1.0000 | ORAL_TABLET | Freq: Every day | ORAL | 6 refills | Status: AC
Start: 2023-12-04 — End: ?

## 2023-12-04 NOTE — Telephone Encounter (Addendum)
  Chief Complaint: New Pt Additional Notes: E2C2 RN called to reschedule appt with alternate provider per [facility]BPU request. Pt denies any current or acute sx at this time. Scheduled patient per protocol on 12/26/2023. Patient verbalized understanding and to call back with worsening symptoms.      Reason for Disposition  Requesting regular office appointment  Answer Assessment - Initial Assessment Questions 1. REASON FOR CALL or QUESTION: "What is your reason for calling today?" or "How can I best help you?" or "What question do you have that I can help answer?"     Has been to ED frequently d/t "severe pain all the way up from my stomach to my head" - reports has multiple scans/specialist with no dx. Pt reports chest would start hurting real bad-- CP, dizziness with syncope, SOB, really bad H/A. Pt reports also being referred to neurologist this week. Just got out of cardiology. Denies any current/acute sx. Does endorse some mild CP, but pt is just coming out of cardiologist appt at this time.  Protocols used: Information Only Call - No Triage-A-AH

## 2023-12-04 NOTE — ED Triage Notes (Signed)
 Patient states chest pain and nausea for two days.

## 2023-12-04 NOTE — ED Provider Notes (Signed)
 [facility] Emergency Department Provider Note     Event Date/Time   First MD Initiated Contact with Patient 12/04/23 1735     (approximate)   History   Chest Pain   HPI  Timothy [name] is a 28 y.o. male with a history of obesity, anxiety, GERD, HLD, and smoking, presents to the ED endorsing 2 weeks of chest pain and SOB.  He notes increased SOB with exertion. Patient denies any cough, congestion, fever, chills, sweats.  He denies any diaphoresis, syncope, or referred pain.  Chart review reveals the patient has been seen in the ED multiple times for intermittent complaints of chest pain as well as headache.  He has also been referred from those ED visits to pulmonology and cardiology respectively.     Physical Exam   Triage Vital Signs: ED Triage Vitals  Encounter Vitals Group     BP 12/04/23 1544 (!) 140/83     Systolic BP Percentile --      Diastolic BP Percentile --      Pulse Rate 12/04/23 1544 78     Resp 12/04/23 1544 18     Temp 12/04/23 1544 97.8 F (36.6 C)     Temp Source 12/04/23 1544 Oral     SpO2 12/04/23 1544 96 %     Weight 12/04/23 1542 267 lb (121.1 kg)     Height 12/04/23 1542 6' (1.829 m)     Head Circumference --      Peak Flow --      Pain Score 12/04/23 1542 7     Pain Loc --      Pain Education --      Exclude from Growth Chart --     Most recent vital signs: Vitals:   12/04/23 1544  BP: (!) 140/83  Pulse: 78  Resp: 18  Temp: 97.8 F (36.6 C)  SpO2: 96%    General Awake, no distress. NAD HEENT NCAT. PERRL. EOMI. No rhinorrhea. Mucous membranes are moist.  CV:  Good peripheral perfusion. RRR RESP:  Normal effort. CTA ABD:  No distention. Soft, nontender   ED Results / Procedures / Treatments   Labs (all labs ordered are listed, but only abnormal results are displayed) Labs Reviewed  BASIC METABOLIC PANEL WITH GFR - Abnormal; Notable for the following components:      Result Value   Glucose, Bld 108  (*)    All other components within normal limits  CBC  TROPONIN I (HIGH SENSITIVITY)    EKG  mVent. rate 78 BPM PR interval 126 ms QRS duration 90 ms QT/QTcB 354/403 ms P-R-T axes 29 34 35 Normal sinus rhythm Normal ECG When compared with ECG of 29-Nov-2023 01:00, No significant change was found  RADIOLOGY  I personally viewed and evaluated these images as part of my medical decision   ED Provider Interpretation: No acute chest x-ray findings  DG Chest 2 View Result Date: 12/04/2023 CLINICAL DATA:  Chest pain and nausea. EXAM: CHEST - 2 VIEW COMPARISON:  Chest radiograph 11/25/2023 FINDINGS: The cardiomediastinal contours are normal. The lungs are clear. Pulmonary vasculature is normal. No consolidation, pleural effusion, or pneumothorax. No acute osseous abnormalities are seen. IMPRESSION: No active cardiopulmonary disease. Electronically Signed   By: Narda Rutherford M.D.   On: 12/04/2023 18:03     PROCEDURES:  Critical Care performed: No  Procedures   MEDICATIONS ORDERED IN ED: Medications - No data to display   IMPRESSION / MDM /  ASSESSMENT AND PLAN / ED COURSE  I reviewed the triage vital signs and the nursing notes.                              Differential diagnosis includes, but is not limited to, ACS, aortic dissection, pulmonary embolism, cardiac tamponade, pneumothorax, pneumonia, pericarditis, myocarditis, GI-related causes including esophagitis/gastritis, and musculoskeletal chest wall pain.    Patient's presentation is most consistent with acute presentation with potential threat to life or bodily function.  Patient's diagnosis is consistent with non-specific chest pain. Symptoms my represent a MSK etiology, but ongoing concern exacerbated by his anxiety. No  lab abnormalities. No EKG abnormalities. No CXR findings. Patient is to follow up with his PCP and specialist as scheduled, as needed or otherwise directed. Patient is given ED precautions to return  to the ED for any worsening or new symptoms.   FINAL CLINICAL IMPRESSION(S) / ED DIAGNOSES   Final diagnoses:  Nonspecific chest pain     Rx / DC Orders   ED Discharge Orders     None        Note:  This document was prepared using Dragon voice recognition software and may include unintentional dictation errors.    Lissa Hoard, PA-C 12/04/23 1945    Dionne Bucy, MD 12/04/23 2352

## 2023-12-04 NOTE — Discharge Instructions (Addendum)
 Your exam, labs, EKG, and Chest XR are normal and reassuring. There is no evidence of any serious cause of your chest pain. Follow-up with your specialist as planned.

## 2023-12-05 DIAGNOSIS — R1032 Left lower quadrant pain: Secondary | ICD-10-CM | POA: Diagnosis not present

## 2023-12-05 DIAGNOSIS — Z720 Tobacco use: Secondary | ICD-10-CM | POA: Diagnosis not present

## 2023-12-05 DIAGNOSIS — R0789 Other chest pain: Secondary | ICD-10-CM | POA: Diagnosis not present

## 2023-12-05 DIAGNOSIS — R079 Chest pain, unspecified: Secondary | ICD-10-CM | POA: Diagnosis not present

## 2023-12-06 DIAGNOSIS — Z1331 Encounter for screening for depression: Secondary | ICD-10-CM | POA: Diagnosis not present

## 2023-12-06 DIAGNOSIS — Z1330 Encounter for screening examination for mental health and behavioral disorders, unspecified: Secondary | ICD-10-CM | POA: Diagnosis not present

## 2023-12-06 DIAGNOSIS — R109 Unspecified abdominal pain: Secondary | ICD-10-CM | POA: Diagnosis not present

## 2023-12-06 DIAGNOSIS — R4589 Other symptoms and signs involving emotional state: Secondary | ICD-10-CM | POA: Diagnosis not present

## 2023-12-06 DIAGNOSIS — Z8719 Personal history of other diseases of the digestive system: Secondary | ICD-10-CM | POA: Diagnosis not present

## 2023-12-06 DIAGNOSIS — Z9049 Acquired absence of other specified parts of digestive tract: Secondary | ICD-10-CM | POA: Diagnosis not present

## 2023-12-08 DIAGNOSIS — R202 Paresthesia of skin: Secondary | ICD-10-CM | POA: Diagnosis not present

## 2023-12-08 DIAGNOSIS — M542 Cervicalgia: Secondary | ICD-10-CM | POA: Diagnosis not present

## 2023-12-08 DIAGNOSIS — R519 Headache, unspecified: Secondary | ICD-10-CM | POA: Diagnosis not present

## 2023-12-08 DIAGNOSIS — G479 Sleep disorder, unspecified: Secondary | ICD-10-CM | POA: Diagnosis not present

## 2023-12-08 DIAGNOSIS — R200 Anesthesia of skin: Secondary | ICD-10-CM | POA: Diagnosis not present

## 2023-12-15 DIAGNOSIS — Z8719 Personal history of other diseases of the digestive system: Secondary | ICD-10-CM

## 2023-12-15 DIAGNOSIS — R202 Paresthesia of skin: Secondary | ICD-10-CM | POA: Diagnosis not present

## 2023-12-15 DIAGNOSIS — K219 Gastro-esophageal reflux disease without esophagitis: Secondary | ICD-10-CM | POA: Diagnosis not present

## 2023-12-15 DIAGNOSIS — Z9049 Acquired absence of other specified parts of digestive tract: Secondary | ICD-10-CM | POA: Insufficient documentation

## 2023-12-15 DIAGNOSIS — Z1331 Encounter for screening for depression: Secondary | ICD-10-CM | POA: Diagnosis not present

## 2023-12-15 DIAGNOSIS — R4589 Other symptoms and signs involving emotional state: Secondary | ICD-10-CM | POA: Diagnosis not present

## 2023-12-15 DIAGNOSIS — F411 Generalized anxiety disorder: Secondary | ICD-10-CM | POA: Diagnosis not present

## 2023-12-15 DIAGNOSIS — R200 Anesthesia of skin: Secondary | ICD-10-CM | POA: Diagnosis not present

## 2023-12-15 DIAGNOSIS — F331 Major depressive disorder, recurrent, moderate: Secondary | ICD-10-CM | POA: Insufficient documentation

## 2023-12-15 HISTORY — DX: Major depressive disorder, recurrent, moderate: F33.1

## 2023-12-15 HISTORY — DX: Acquired absence of other specified parts of digestive tract: Z90.49

## 2023-12-15 HISTORY — DX: Personal history of other diseases of the digestive system: Z87.19

## 2023-12-26 ENCOUNTER — Inpatient Hospital Stay: Admitting: Internal Medicine

## 2023-12-26 NOTE — Progress Notes (Deleted)
 [facility] [place] Pulmonary Medicine Consultation      Date: 12/26/2023,   MRN# [id number] Timothy T Brackins 04-05-1996  Timothy T Bolar is a 28 y.o. old male seen in consultation for *** at the request of ***.     CHIEF COMPLAINT:      HISTORY OF PRESENT ILLNESS   28 y.o. male with a history of obesity, anxiety, GERD, HLD, and smoking, presents to the ED endorsing 2 weeks of chest pain and SOB.  He notes increased SOB with exertion. Patient denies any cough, congestion, fever, chills, sweats.  He denies any diaphoresis, syncope, or referred pain.  Chart review reveals the patient has been seen in the ED multiple times for intermittent complaints of chest pain as well as headache.  He has also been referred from those ED visits to pulmonology and cardiology respectively.    Chest x-ray in April 2025 Reviewed in detail with patient No significant abnormalities No pneumonia No effusions No edema   History of COVID July 2024   PAST MEDICAL HISTORY   Past Medical History:  Diagnosis Date   Allergy    Anxiety    Depression    Diverticulitis    Dyspnea    Fainting spell    in middle school   Family history of bone cancer    Family history of breast cancer    Hyperlipidemia      SURGICAL HISTORY   Past Surgical History:  Procedure Laterality Date   COLECTOMY WITH COLOSTOMY CREATION/HARTMANN PROCEDURE N/A 07/22/2018   Procedure: COLECTOMY WITH COLOSTOMY CREATION/HARTMANN PROCEDURE;  Surgeon: Mercy Stall, MD;  Location: [facility] ORS;  Service: General;  Laterality: N/A;   COLONOSCOPY WITH PROPOFOL  N/A 11/01/2018   Procedure: COLONOSCOPY WITH PROPOFOL ;  Surgeon: Luke Salaam, MD;  Location: [facility] ENDOSCOPY;  Service: Gastroenterology;  Laterality: N/A;   COLONOSCOPY WITH PROPOFOL  N/A 05/26/2022   Procedure: COLONOSCOPY WITH PROPOFOL ;  Surgeon: Luke Salaam, MD;  Location: [facility] ENDOSCOPY;  Service: Gastroenterology;  Laterality: N/A;   COLOSTOMY REVERSAL N/A 03/22/2019   Procedure:  COLOSTOMY REVERSAL;  Surgeon: Mercy Stall, MD;  Location: [facility] ORS;  Service: General;  Laterality: N/A;   ESOPHAGOGASTRODUODENOSCOPY N/A 05/26/2022   Procedure: ESOPHAGOGASTRODUODENOSCOPY (EGD);  Surgeon: Luke Salaam, MD;  Location: [facility] ENDOSCOPY;  Service: Gastroenterology;  Laterality: N/A;     FAMILY HISTORY   Family History  Problem Relation Age of Onset   Alcohol abuse Mother    Depression Mother    Cervical cancer Mother    Depression Father    Anxiety disorder Father    Healthy Sister    Alport syndrome Maternal Aunt    Heart disease Maternal Grandmother    Diabetes Maternal Grandmother    Cancer Maternal Grandmother        breast cancer   Alport syndrome Maternal Grandmother    Cancer Paternal Grandfather        Bone cancer     SOCIAL HISTORY   Social History   Tobacco Use   Smoking status: Former    Current packs/day: 0.25    Types: Cigarettes, E-cigarettes   Smokeless tobacco: Former    Types: Snuff   Tobacco comments:    pt smoke pack a week  Vaping Use   Vaping status: Every Day   Last attempt to quit: 09/09/2019   Substances: Nicotine , CBD, Flavoring  Substance Use Topics   Alcohol use: Not Currently    Comment: Quit 07/2023   Drug use: Not Currently    Comment: THC slushly  MEDICATIONS    Home Medication:  Current Outpatient Rx   Order #: [id number] Class: Normal   Order #: [id number] Class: Normal    Current Medication:  Current Outpatient Medications:    Plecanatide  (TRULANCE ) 3 MG TABS, Take 1 tablet (3 mg total) by mouth daily., Disp: 30 tablet, Rfl: 6   polyethylene glycol-electrolytes (NULYTELY ) 420 g solution, Drink one 8 oz glass of mixture every 15 minutes until you finish the jug., Disp: 4000 mL, Rfl: 0    ALLERGIES   Patient has no known allergies.   There were no vitals taken for this visit.   Review of Systems: Gen:  Denies  fever, sweats, chills weight loss  HEENT: Denies blurred vision, double vision, ear  pain, eye pain, hearing loss, nose bleeds, sore throat Cardiac:  No dizziness, chest pain or heaviness, chest tightness,edema, No JVD Resp:   No cough, -sputum production, -shortness of breath,-wheezing, -hemoptysis,  Other:  All other systems negative   Physical Examination:   General Appearance: No distress  EYES PERRLA, EOM intact.   NECK Supple, No JVD Pulmonary: normal breath sounds, No wheezing.  CardiovascularNormal S1,S2.  No m/r/g.   Abdomen: Benign, Soft, non-tender. Neurology UE/LE 5/5 strength, no focal deficits Ext pulses intact, cap refill intact ALL OTHER ROS ARE NEGATIVE      IMAGING    DG Chest 2 View Result Date: 12/04/2023 CLINICAL DATA:  Chest pain and nausea. EXAM: CHEST - 2 VIEW COMPARISON:  Chest radiograph 11/25/2023 FINDINGS: The cardiomediastinal contours are normal. The lungs are clear. Pulmonary vasculature is normal. No consolidation, pleural effusion, or pneumothorax. No acute osseous abnormalities are seen. IMPRESSION: No active cardiopulmonary disease. Electronically Signed   By: Chadwick Colonel M.D.   On: 12/04/2023 18:03   ECHOCARDIOGRAM COMPLETE Result Date: 11/28/2023    ECHOCARDIOGRAM REPORT   Patient Name:   Timothy [name] Date of Exam: 11/28/2023 Medical Rec #:  [id number]      Height:       72.0 in Accession #:    [id number]     Weight:       267.0 lb Date of Birth:  1996-01-15      BSA:          2.409 m Patient Age:    28 years       BP:           129/83 mmHg Patient Gender: M              HR:           60 bpm. Exam Location:  [facility] Procedure: 2D Echo, Cardiac Doppler and Color Doppler (Both Spectral and Color            Flow Doppler were utilized during procedure). Indications:    R06.02 SOB; R55 Syncope; R07.9* Chest pain, unspecified  History:        Patient has no prior history of Echocardiogram examinations.                 Signs/Symptoms:Chest Pain, Shortness of Breath and Syncope; Risk                 Factors:Dyslipidemia and Former Smoker.   Sonographer:    Malena Scull RDMS, RVT, RDCS Referring Phys: [id number] [facility] IMPRESSIONS  1. Left ventricular ejection fraction, by estimation, is 55 to 60%. Left ventricular ejection fraction by 2D MOD biplane is 54.8 %. Left ventricular ejection fraction by PLAX is 56 %. The left ventricle has normal function.  The left ventricle has no regional wall motion abnormalities. Left ventricular diastolic parameters were normal.  2. Right ventricular systolic function is normal. The right ventricular size is moderately enlarged.  3. The mitral valve is normal in structure. No evidence of mitral valve regurgitation.  4. The aortic valve is tricuspid. Aortic valve regurgitation is not visualized. FINDINGS  Left Ventricle: Left ventricular ejection fraction, by estimation, is 55 to 60%. Left ventricular ejection fraction by PLAX is 56 %. Left ventricular ejection fraction by 2D MOD biplane is 54.8 %. The left ventricle has normal function. The left ventricle has no regional wall motion abnormalities. Strain was performed and the global longitudinal strain is indeterminate. The left ventricular internal cavity size was normal in size. There is no left ventricular hypertrophy. Left ventricular diastolic parameters were normal. Right Ventricle: The right ventricular size is moderately enlarged. No increase in right ventricular wall thickness. Right ventricular systolic function is normal. Left Atrium: Left atrial size was normal in size. Right Atrium: Right atrial size was normal in size. Pericardium: There is no evidence of pericardial effusion. Mitral Valve: The mitral valve is normal in structure. No evidence of mitral valve regurgitation. Tricuspid Valve: The tricuspid valve is normal in structure. Tricuspid valve regurgitation is not demonstrated. Aortic Valve: The aortic valve is tricuspid. Aortic valve regurgitation is not visualized. Aortic valve mean gradient measures 4.0 mmHg. Aortic valve peak gradient  measures 7.1 mmHg. Aortic valve area, by VTI measures 3.64 cm. Pulmonic Valve: The pulmonic valve was not well visualized. Pulmonic valve regurgitation is not visualized. Aorta: The aortic root is normal in size and structure. IAS/Shunts: No atrial level shunt detected by color flow Doppler. Additional Comments: 3D was performed not requiring image post processing on an independent workstation and was indeterminate.  LEFT VENTRICLE PLAX 2D                        Biplane EF (MOD) LV EF:         Left            LV Biplane EF:   Left                ventricular                      ventricular                ejection                         ejection                fraction by                      fraction by                PLAX is 56                       2D MOD                %.                               biplane is LVIDd:         5.70 cm  54.8 %. LVIDs:         4.00 cm LV PW:         0.70 cm         Diastology LV IVS:        0.80 cm         LV e' medial:    11.90 cm/s LVOT diam:     2.30 cm         LV E/e' medial:  9.2 LV SV:         94              LV e' lateral:   14.60 cm/s LV SV Index:   39              LV E/e' lateral: 7.5 LVOT Area:     4.15 cm  LV Volumes (MOD) LV vol d, MOD    120.0 ml A2C: LV vol d, MOD    125.0 ml A4C: LV vol s, MOD    55.1 ml A2C: LV vol s, MOD    58.0 ml A4C: LV SV MOD A2C:   64.9 ml LV SV MOD A4C:   125.0 ml LV SV MOD BP:    69.3 ml RIGHT VENTRICLE RV Basal diam:  5.10 cm RV Mid diam:    3.80 cm RV S prime:     12.50 cm/s TAPSE (M-mode): 2.9 cm LEFT ATRIUM             Index        RIGHT ATRIUM           Index LA diam:        4.10 cm 1.70 cm/m   RA Area:     19.20 cm LA Vol (A2C):   69.7 ml 28.93 ml/m  RA Volume:   56.00 ml  23.25 ml/m LA Vol (A4C):   50.2 ml 20.84 ml/m LA Biplane Vol: 60.2 ml 24.99 ml/m  AORTIC VALVE                    PULMONIC VALVE AV Area (Vmax):    3.97 cm     PV Vmax:       0.95 m/s AV Area (Vmean):   3.64 cm     PV Peak grad:   3.6 mmHg AV Area (VTI):     3.64 cm AV Vmax:           133.00 cm/s AV Vmean:          85.600 cm/s AV VTI:            0.258 m AV Peak Grad:      7.1 mmHg AV Mean Grad:      4.0 mmHg LVOT Vmax:         127.00 cm/s LVOT Vmean:        74.900 cm/s LVOT VTI:          0.226 m LVOT/AV VTI ratio: 0.88  AORTA Ao Root diam: 3.00 cm Ao Arch diam: 2.4 cm MITRAL VALVE MV Area (PHT): 4.31 cm     SHUNTS MV Decel Time: 176 msec     Systemic VTI:  0.23 m MV E velocity: 109.00 cm/s  Systemic Diam: 2.30 cm MV A velocity: 80.20 cm/s MV E/A ratio:  1.36 Constancia Delton MD Electronically signed by Constancia Delton MD Signature Date/Time: 11/28/2023/2:17:53 PM    Final    CT HEAD WO CONTRAST ( ) Result Date: 11/27/2023 CLINICAL DATA:  Intermittent  headache for 1 week. Right-sided facial pain. EXAM: CT HEAD WITHOUT CONTRAST TECHNIQUE: Contiguous axial images were obtained from the base of the skull through the vertex without intravenous contrast. RADIATION DOSE REDUCTION: This exam was performed according to the departmental dose-optimization program which includes automated exposure control, adjustment of the mA and/or kV according to patient size and/or use of iterative reconstruction technique. COMPARISON:  Head CT 11/15/2023 FINDINGS: Brain: There is no evidence of an acute infarct, intracranial hemorrhage, mass, midline shift, or extra-axial fluid collection. Cerebral volume is normal. The ventricles are unchanged and normal in size. Vascular: No hyperdense vessel. Skull: No acute fracture or suspicious lesion. Sinuses/Orbits: Visualized paranasal sinuses and mastoid air cells are clear. Unremarkable orbits. Other: None. IMPRESSION: Negative head CT. Electronically Signed   By: Aundra Lee M.D.   On: 11/27/2023 15:54    CBC    Component Value Date/Time   WBC 8.9 12/04/2023 1551   RBC 5.03 12/04/2023 1551   HGB 15.5 12/04/2023 1551   HGB 14.6 12/16/2014 0839   HCT 44.5 12/04/2023 1551   HCT 44.0 12/16/2014 0839    PLT 229 12/04/2023 1551   PLT 233 12/16/2014 0839   MCV 88.5 12/04/2023 1551   MCV 88 12/16/2014 0839   MCH 30.8 12/04/2023 1551   MCHC 34.8 12/04/2023 1551   RDW 12.4 12/04/2023 1551   RDW 13.8 12/16/2014 0839   LYMPHSABS 3.3 05/12/2023 1607   LYMPHSABS 2.9 12/16/2014 0839   MONOABS 1.2 (H) 05/12/2023 1607   MONOABS 1.2 (H) 12/16/2014 0839   EOSABS 0.1 05/12/2023 1607   EOSABS 0.3 12/16/2014 0839   BASOSABS 0.0 05/12/2023 1607   BASOSABS 0.1 12/16/2014 0839      Latest Ref Rng & Units 12/04/2023    3:51 PM 11/28/2023    8:17 PM 11/25/2023   11:44 AM  BMP  Glucose 70 - 99 mg/dL 409  811  914   BUN 6 - 20 mg/dL 13  13  11    Creatinine 0.61 - 1.24 mg/dL 7.82  9.56  2.13   Sodium 135 - 145 mmol/L 139  135  137   Potassium 3.5 - 5.1 mmol/L 3.8  3.5  3.9   Chloride 98 - 111 mmol/L 106  105  106   CO2 22 - 32 mmol/L 24  20  22    Calcium  8.9 - 10.3 mg/dL 9.2  9.0  9.2      ASSESSMENT/PLAN                   MEDICATION ADJUSTMENTS/LABS AND TESTS ORDERED:    CURRENT MEDICATIONS REVIEWED AT LENGTH WITH PATIENT TODAY   Patient  satisfied with Plan of action and management. All questions answered  Follow up   I spent a total of *** minutes reviewing chart data, face-to-face evaluation with the patient, counseling and coordination of care as detailed above.     Lady Pier, M.D.  [facility]  Medical Director [facility] Medical Director [facility] Cardio-Pulmonary Department

## 2024-01-09 ENCOUNTER — Other Ambulatory Visit: Payer: Self-pay

## 2024-01-11 ENCOUNTER — Ambulatory Visit: Admitting: Gastroenterology

## 2024-01-11 NOTE — Progress Notes (Deleted)
 Luke Salaam MD, MRCP(U.K) [address]  [address]  [place], [place] [address]  Main: [phone]  Fax: [phone]   Primary Care Physician: Carollynn Cirri, NP  Primary Gastroenterologist:  Dr. Luke Salaam   No chief complaint on file.   HPI: Timothy [name] is a 28 y.o. male Summary of history :     Last seen at our office back in 02/2022. Prior evaluation including ED visits for   nonspecific chest pain. CT scan on 02/13/2021 showed a partial colonic resection in the sigmoid colon with a prior  a history of diverticulitis in 2019 complicated by perforation at sigmoid resection.small hernia also seen .   11/01/2018: Colonoscopy 15 mm polyp was resected in the transverse colon.  The polyp was a tubular adenoma.  He is due for surveillance colonoscopy.   He has been having episodes of chest pain feels like a pressure sometimes radiating to his lower abdomen sometimes radiating to his neck sometimes short of breath sometimes sharp in nature.  He has been advised to see a cardiologist has seen Dr. Meredeth Stallion but does not have the money to proceed with a stress test.  It is pending.  He has was some constipation but cannot quantify how often he has a bowel movement tried MiraLAX  to low-dose which has not really helped.  He says he cannot swallow pills for acid reflux he wants a liquid. [facility] on 10/13/2023: CT abdomen negative for LLQ pain .      05/26/2022: EGD:  duodenal polyp- bx- benign, Esophageal bx normal . Colonoscopy normal.    09/12/2023: RUQ Abdomen : normal .      Interval history  10/16/2023-01/11/2024 11/29/2023 admitted to the hospital with anxiety.  On 12/04/2023 admitted with shortness of breath.  Was found to be musculoskeletal and discharged.   Multiple ER visits for chest pain . Seen cardiology 5 days back for the same  Has had negative cardiac work up , echo pending .       He says that after his recent ER visit he was started on Prozac  which has helped him with a  lot of his symptoms he has suffered from headaches at this point of time.  His main complaint is left lower quadrant pain going on for a few months which is preceded by constipation and relieved after bowel movement.  Not taking any OTC medications for the same he has tried to increase his dietary fiber.  He says he has difficulty swallowing any pills for laxatives.  No other complaints. Current Outpatient Medications  Medication Sig Dispense Refill   FLUoxetine  (PROZAC ) 20 MG/5ML solution TAKE 2.5 ML BY MOUTH EVERY DAY     gabapentin  (NEURONTIN ) 250 MG/5ML solution Take 5 mLs nightly for 1 week, then increase to 5 mLs twice daily and continue this dose.     LORazepam  (ATIVAN ) 0.5 MG tablet Take by mouth.     omeprazole  (KONVOMEP ) 2 mg/mL SUSP oral suspension Take 20 mg by mouth.     Plecanatide  (TRULANCE ) 3 MG TABS Take 1 tablet (3 mg total) by mouth daily. 30 tablet 6   polyethylene glycol-electrolytes (NULYTELY ) 420 g solution Drink one 8 oz glass of mixture every 15 minutes until you finish the jug. 4000 mL 0   No current facility-administered medications for this visit.    Allergies as of 01/11/2024 - Review Complete 12/04/2023  Allergen Reaction Noted   Morphine  Shortness Of Breath 12/08/2023   Dexamethasone  Other (See  Comments) 12/08/2023      ROS:  General: Negative for anorexia, weight loss, fever, chills, fatigue, weakness. ENT: Negative for hoarseness, difficulty swallowing , nasal congestion. CV: Negative for chest pain, angina, palpitations, dyspnea on exertion, peripheral edema.  Respiratory: Negative for dyspnea at rest, dyspnea on exertion, cough, sputum, wheezing.  GI: See history of present illness. GU:  Negative for dysuria, hematuria, urinary incontinence, urinary frequency, nocturnal urination.  Endo: Negative for unusual weight change.    Physical Examination:   There were no vitals taken for this visit.  General: Well-nourished, well-developed in no acute  distress.  Eyes: No icterus. Conjunctivae pink. Mouth: Oropharyngeal mucosa moist and pink , no lesions erythema or exudate. Lungs: Clear to auscultation bilaterally. Non-labored. Heart: Regular rate and rhythm, no murmurs rubs or gallops.  Abdomen: Bowel sounds are normal, nontender, nondistended, no hepatosplenomegaly or masses, no abdominal bruits or hernia , no rebound or guarding.   Extremities: No lower extremity edema. No clubbing or deformities. Neuro: Alert and oriented x 3.  Grossly intact. Skin: Warm and dry, no jaundice.   Psych: Alert and cooperative, normal mood and affect.   Imaging Studies: No results found.  Assessment and Plan:   Timothy T Podolak is a 27 y.o. y/o male  here to follow-up for abdominal pain left lower quadrant with features suggestive of irritable bowel syndrome with constipation.  He is not on any medications presently.  He is not interested in pills as he says he has difficulty swallowing them in the past.   Plan 1.  High-fiber diet 2.  Commence on MiraLAX  1 capful twice a day samples have been provided 3.  I have also given him samples of probiotics.  I also believe a part of his symptoms may be related to anxiety and he is presently on Prozac  which should hopefully help him with his GI symptoms to.  Previously has had noncardiac chest pain with normal endoscopy evaluation if that recurs in the future we could consider esophageal manometric and impedance pH testing to evaluate for esophageal hypersensitivity syndrome . 4.  Advised him to restart his omeprazole  for his noncardiac chest pain which she is not presently taking.    Dr Luke Salaam  MD,MRCP [facility]) Follow up in ***

## 2024-03-23 DIAGNOSIS — R590 Localized enlarged lymph nodes: Secondary | ICD-10-CM | POA: Diagnosis not present

## 2024-03-23 DIAGNOSIS — J039 Acute tonsillitis, unspecified: Secondary | ICD-10-CM | POA: Diagnosis not present

## 2024-03-23 DIAGNOSIS — J351 Hypertrophy of tonsils: Secondary | ICD-10-CM | POA: Diagnosis not present

## 2024-03-29 DIAGNOSIS — R509 Fever, unspecified: Secondary | ICD-10-CM | POA: Diagnosis not present

## 2024-03-29 DIAGNOSIS — J029 Acute pharyngitis, unspecified: Secondary | ICD-10-CM | POA: Diagnosis not present

## 2024-03-30 DIAGNOSIS — J029 Acute pharyngitis, unspecified: Secondary | ICD-10-CM | POA: Diagnosis not present

## 2024-03-30 DIAGNOSIS — R059 Cough, unspecified: Secondary | ICD-10-CM | POA: Diagnosis not present

## 2024-03-30 DIAGNOSIS — J039 Acute tonsillitis, unspecified: Secondary | ICD-10-CM | POA: Diagnosis not present

## 2024-04-10 DIAGNOSIS — D72829 Elevated white blood cell count, unspecified: Secondary | ICD-10-CM | POA: Diagnosis not present

## 2024-06-07 DIAGNOSIS — E785 Hyperlipidemia, unspecified: Secondary | ICD-10-CM | POA: Diagnosis not present

## 2024-06-07 DIAGNOSIS — R197 Diarrhea, unspecified: Secondary | ICD-10-CM | POA: Diagnosis not present

## 2024-06-07 DIAGNOSIS — F1090 Alcohol use, unspecified, uncomplicated: Secondary | ICD-10-CM | POA: Diagnosis not present

## 2024-06-07 DIAGNOSIS — R42 Dizziness and giddiness: Secondary | ICD-10-CM | POA: Diagnosis not present

## 2024-06-07 DIAGNOSIS — R1032 Left lower quadrant pain: Secondary | ICD-10-CM | POA: Diagnosis not present

## 2024-06-07 DIAGNOSIS — F419 Anxiety disorder, unspecified: Secondary | ICD-10-CM | POA: Diagnosis not present

## 2024-06-07 DIAGNOSIS — F17210 Nicotine dependence, cigarettes, uncomplicated: Secondary | ICD-10-CM | POA: Diagnosis not present

## 2024-06-07 DIAGNOSIS — Z79899 Other long term (current) drug therapy: Secondary | ICD-10-CM | POA: Diagnosis not present

## 2024-06-07 DIAGNOSIS — N2889 Other specified disorders of kidney and ureter: Secondary | ICD-10-CM | POA: Diagnosis not present

## 2024-06-07 DIAGNOSIS — F17290 Nicotine dependence, other tobacco product, uncomplicated: Secondary | ICD-10-CM | POA: Diagnosis not present

## 2024-06-19 DIAGNOSIS — K5900 Constipation, unspecified: Secondary | ICD-10-CM | POA: Diagnosis not present

## 2024-06-19 DIAGNOSIS — R140 Abdominal distension (gaseous): Secondary | ICD-10-CM | POA: Diagnosis not present
